# Patient Record
Sex: Female | Born: 1970
Health system: Southern US, Community
[De-identification: ages and names within clinical notes are randomized; demographics above are authoritative.]

## PROBLEM LIST (undated history)

## (undated) DIAGNOSIS — G890 Central pain syndrome: Secondary | ICD-10-CM

## (undated) DIAGNOSIS — G9332 Myalgic encephalomyelitis/chronic fatigue syndrome: Secondary | ICD-10-CM

## (undated) DIAGNOSIS — R0602 Shortness of breath: Secondary | ICD-10-CM

## (undated) DIAGNOSIS — F419 Anxiety disorder, unspecified: Secondary | ICD-10-CM

## (undated) DIAGNOSIS — K5909 Other constipation: Secondary | ICD-10-CM

## (undated) DIAGNOSIS — J309 Allergic rhinitis, unspecified: Secondary | ICD-10-CM

## (undated) DIAGNOSIS — Z86711 Personal history of pulmonary embolism: Secondary | ICD-10-CM

## (undated) DIAGNOSIS — I2699 Other pulmonary embolism without acute cor pulmonale: Secondary | ICD-10-CM

## (undated) DIAGNOSIS — G43909 Migraine, unspecified, not intractable, without status migrainosus: Secondary | ICD-10-CM

## (undated) DIAGNOSIS — M25569 Pain in unspecified knee: Secondary | ICD-10-CM

## (undated) DIAGNOSIS — K829 Disease of gallbladder, unspecified: Secondary | ICD-10-CM

## (undated) DIAGNOSIS — F32A Depression, unspecified: Secondary | ICD-10-CM

## (undated) DIAGNOSIS — Z8719 Personal history of other diseases of the digestive system: Secondary | ICD-10-CM

## (undated) DIAGNOSIS — Z973 Presence of spectacles and contact lenses: Secondary | ICD-10-CM

## (undated) DIAGNOSIS — G379 Demyelinating disease of central nervous system, unspecified: Secondary | ICD-10-CM

## (undated) DIAGNOSIS — G8929 Other chronic pain: Secondary | ICD-10-CM

## (undated) DIAGNOSIS — E785 Hyperlipidemia, unspecified: Secondary | ICD-10-CM

## (undated) DIAGNOSIS — F411 Generalized anxiety disorder: Secondary | ICD-10-CM

## (undated) DIAGNOSIS — R110 Nausea: Secondary | ICD-10-CM

## (undated) DIAGNOSIS — F909 Attention-deficit hyperactivity disorder, unspecified type: Secondary | ICD-10-CM

## (undated) DIAGNOSIS — Z86010 Personal history of colonic polyps: Secondary | ICD-10-CM

## (undated) DIAGNOSIS — F329 Major depressive disorder, single episode, unspecified: Secondary | ICD-10-CM

## (undated) DIAGNOSIS — F988 Other specified behavioral and emotional disorders with onset usually occurring in childhood and adolescence: Secondary | ICD-10-CM

## (undated) DIAGNOSIS — D6861 Antiphospholipid syndrome: Secondary | ICD-10-CM

## (undated) DIAGNOSIS — M2550 Pain in unspecified joint: Secondary | ICD-10-CM

## (undated) DIAGNOSIS — E119 Type 2 diabetes mellitus without complications: Secondary | ICD-10-CM

## (undated) DIAGNOSIS — G5600 Carpal tunnel syndrome, unspecified upper limb: Secondary | ICD-10-CM

## (undated) DIAGNOSIS — T7840XA Allergy, unspecified, initial encounter: Secondary | ICD-10-CM

## (undated) DIAGNOSIS — S83207A Unspecified tear of unspecified meniscus, current injury, left knee, initial encounter: Secondary | ICD-10-CM

## (undated) DIAGNOSIS — S83249A Other tear of medial meniscus, current injury, unspecified knee, initial encounter: Secondary | ICD-10-CM

## (undated) DIAGNOSIS — Z86718 Personal history of other venous thrombosis and embolism: Secondary | ICD-10-CM

## (undated) DIAGNOSIS — R531 Weakness: Secondary | ICD-10-CM

## (undated) HISTORY — DX: Demyelinating disease of central nervous system, unspecified: G37.9

## (undated) HISTORY — PX: ADENOIDECTOMY: SUR15

## (undated) HISTORY — DX: Disease of gallbladder, unspecified: K82.9

## (undated) HISTORY — DX: Anxiety disorder, unspecified: F41.9

## (undated) HISTORY — DX: Antiphospholipid syndrome: D68.61

## (undated) HISTORY — DX: Depression, unspecified: F32.A

## (undated) HISTORY — DX: Attention-deficit hyperactivity disorder, unspecified type: F90.9

## (undated) HISTORY — DX: Type 2 diabetes mellitus without complications: E11.9

## (undated) HISTORY — DX: Other chronic pain: G89.29

## (undated) HISTORY — DX: Other pulmonary embolism without acute cor pulmonale: I26.99

## (undated) HISTORY — DX: Central pain syndrome: G89.0

## (undated) HISTORY — DX: Other tear of medial meniscus, current injury, unspecified knee, initial encounter: S83.249A

## (undated) HISTORY — DX: Major depressive disorder, single episode, unspecified: F32.9

## (undated) HISTORY — DX: Nausea: R11.0

## (undated) HISTORY — DX: Myalgic encephalomyelitis/chronic fatigue syndrome: G93.32

## (undated) HISTORY — DX: Personal history of pulmonary embolism: Z86.711

## (undated) HISTORY — DX: Migraine, unspecified, not intractable, without status migrainosus: G43.909

## (undated) HISTORY — DX: Shortness of breath: R06.02

## (undated) HISTORY — PX: CARPAL TUNNEL RELEASE: SHX101

## (undated) HISTORY — DX: Pain in unspecified joint: M25.50

## (undated) HISTORY — DX: Pain in unspecified knee: M25.569

## (undated) HISTORY — DX: Hyperlipidemia, unspecified: E78.5

## (undated) HISTORY — DX: Carpal tunnel syndrome, unspecified upper limb: G56.00

## (undated) HISTORY — DX: Personal history of other venous thrombosis and embolism: Z86.718

## (undated) HISTORY — PX: OTHER SURGICAL HISTORY: SHX169

## (undated) HISTORY — DX: Allergy, unspecified, initial encounter: T78.40XA

## (undated) HISTORY — DX: Other specified behavioral and emotional disorders with onset usually occurring in childhood and adolescence: F98.8

---

## 1991-08-23 HISTORY — PX: KNEE ARTHROSCOPY: SUR90

## 1995-08-23 HISTORY — PX: BREAST REDUCTION SURGERY: SHX8

## 1996-08-22 HISTORY — PX: CHOLECYSTECTOMY: SHX55

## 2002-05-09 ENCOUNTER — Encounter: Payer: Self-pay | Admitting: Internal Medicine

## 2002-05-09 ENCOUNTER — Encounter (INDEPENDENT_AMBULATORY_CARE_PROVIDER_SITE_OTHER): Payer: Self-pay | Admitting: *Deleted

## 2004-06-03 ENCOUNTER — Ambulatory Visit: Payer: Self-pay | Admitting: Allergy

## 2005-12-16 ENCOUNTER — Ambulatory Visit (HOSPITAL_COMMUNITY): Payer: Self-pay | Admitting: Psychiatry

## 2006-01-02 ENCOUNTER — Ambulatory Visit (HOSPITAL_COMMUNITY): Payer: Self-pay | Admitting: Psychiatry

## 2006-01-27 ENCOUNTER — Ambulatory Visit (HOSPITAL_COMMUNITY): Payer: Self-pay | Admitting: Psychiatry

## 2006-02-13 ENCOUNTER — Ambulatory Visit (HOSPITAL_COMMUNITY): Payer: Self-pay | Admitting: Psychiatry

## 2006-04-25 ENCOUNTER — Ambulatory Visit (HOSPITAL_COMMUNITY): Payer: Self-pay | Admitting: Psychiatry

## 2006-05-17 ENCOUNTER — Ambulatory Visit (HOSPITAL_COMMUNITY): Payer: Self-pay | Admitting: Psychiatry

## 2006-05-31 ENCOUNTER — Ambulatory Visit (HOSPITAL_COMMUNITY): Payer: Self-pay | Admitting: Psychiatry

## 2006-06-07 ENCOUNTER — Ambulatory Visit: Payer: Self-pay | Admitting: Internal Medicine

## 2006-06-19 ENCOUNTER — Ambulatory Visit (HOSPITAL_COMMUNITY): Payer: Self-pay | Admitting: Psychiatry

## 2006-07-03 ENCOUNTER — Ambulatory Visit (HOSPITAL_COMMUNITY): Payer: Self-pay | Admitting: Psychiatry

## 2006-07-17 ENCOUNTER — Ambulatory Visit (HOSPITAL_COMMUNITY): Payer: Self-pay | Admitting: Psychiatry

## 2006-11-16 ENCOUNTER — Ambulatory Visit: Payer: Self-pay | Admitting: Family Medicine

## 2006-12-14 ENCOUNTER — Ambulatory Visit: Payer: Self-pay | Admitting: Internal Medicine

## 2006-12-15 LAB — CONVERTED CEMR LAB
Albumin: 3.8 g/dL (ref 3.5–5.2)
BUN: 11 mg/dL (ref 6–23)
CO2: 27 meq/L (ref 19–32)
Calcium: 9.2 mg/dL (ref 8.4–10.5)
Chloride: 104 meq/L (ref 96–112)
Creatinine, Ser: 0.7 mg/dL (ref 0.4–1.2)
GFR calc Af Amer: 122 mL/min
GFR calc non Af Amer: 101 mL/min
Glucose, Bld: 277 mg/dL — ABNORMAL HIGH (ref 70–99)
Hgb A1c MFr Bld: 12.2 % — ABNORMAL HIGH (ref 4.6–6.0)
Phosphorus: 3.5 mg/dL (ref 2.3–4.6)
Potassium: 4.2 meq/L (ref 3.5–5.1)
Sodium: 136 meq/L (ref 135–145)

## 2007-04-20 ENCOUNTER — Ambulatory Visit: Payer: Self-pay | Admitting: Family Medicine

## 2007-04-20 ENCOUNTER — Emergency Department (HOSPITAL_COMMUNITY): Admission: EM | Admit: 2007-04-20 | Discharge: 2007-04-20 | Payer: Self-pay | Admitting: Emergency Medicine

## 2007-04-20 LAB — CONVERTED CEMR LAB: Blood Glucose, Fingerstick: 177

## 2007-05-14 DIAGNOSIS — F308 Other manic episodes: Secondary | ICD-10-CM | POA: Insufficient documentation

## 2007-08-07 ENCOUNTER — Telehealth (INDEPENDENT_AMBULATORY_CARE_PROVIDER_SITE_OTHER): Payer: Self-pay | Admitting: *Deleted

## 2007-09-18 ENCOUNTER — Telehealth (INDEPENDENT_AMBULATORY_CARE_PROVIDER_SITE_OTHER): Payer: Self-pay | Admitting: *Deleted

## 2007-09-27 ENCOUNTER — Telehealth (INDEPENDENT_AMBULATORY_CARE_PROVIDER_SITE_OTHER): Payer: Self-pay | Admitting: *Deleted

## 2007-10-18 ENCOUNTER — Telehealth (INDEPENDENT_AMBULATORY_CARE_PROVIDER_SITE_OTHER): Payer: Self-pay | Admitting: *Deleted

## 2007-11-28 ENCOUNTER — Telehealth (INDEPENDENT_AMBULATORY_CARE_PROVIDER_SITE_OTHER): Payer: Self-pay | Admitting: *Deleted

## 2007-12-17 ENCOUNTER — Encounter: Payer: Self-pay | Admitting: Family Medicine

## 2007-12-17 ENCOUNTER — Ambulatory Visit: Payer: Self-pay | Admitting: Family Medicine

## 2007-12-17 DIAGNOSIS — N912 Amenorrhea, unspecified: Secondary | ICD-10-CM | POA: Insufficient documentation

## 2007-12-17 LAB — CONVERTED CEMR LAB: hCG, Beta Chain, Quant, S: 81.23 milliintl units/mL

## 2007-12-19 ENCOUNTER — Ambulatory Visit: Payer: Self-pay | Admitting: Family Medicine

## 2007-12-21 ENCOUNTER — Telehealth (INDEPENDENT_AMBULATORY_CARE_PROVIDER_SITE_OTHER): Payer: Self-pay | Admitting: Internal Medicine

## 2007-12-21 ENCOUNTER — Encounter (INDEPENDENT_AMBULATORY_CARE_PROVIDER_SITE_OTHER): Payer: Self-pay | Admitting: Internal Medicine

## 2007-12-26 ENCOUNTER — Telehealth (INDEPENDENT_AMBULATORY_CARE_PROVIDER_SITE_OTHER): Payer: Self-pay | Admitting: Internal Medicine

## 2007-12-28 ENCOUNTER — Telehealth (INDEPENDENT_AMBULATORY_CARE_PROVIDER_SITE_OTHER): Payer: Self-pay | Admitting: Internal Medicine

## 2008-02-01 ENCOUNTER — Telehealth (INDEPENDENT_AMBULATORY_CARE_PROVIDER_SITE_OTHER): Payer: Self-pay | Admitting: Internal Medicine

## 2008-06-06 ENCOUNTER — Encounter: Payer: Self-pay | Admitting: Gastroenterology

## 2008-06-06 ENCOUNTER — Encounter (INDEPENDENT_AMBULATORY_CARE_PROVIDER_SITE_OTHER): Payer: Self-pay | Admitting: *Deleted

## 2008-06-13 ENCOUNTER — Telehealth: Payer: Self-pay | Admitting: Family Medicine

## 2008-06-24 ENCOUNTER — Encounter (INDEPENDENT_AMBULATORY_CARE_PROVIDER_SITE_OTHER): Payer: Self-pay | Admitting: *Deleted

## 2008-07-03 ENCOUNTER — Telehealth (INDEPENDENT_AMBULATORY_CARE_PROVIDER_SITE_OTHER): Payer: Self-pay | Admitting: Internal Medicine

## 2008-07-03 ENCOUNTER — Ambulatory Visit: Payer: Self-pay | Admitting: Family Medicine

## 2008-07-03 DIAGNOSIS — F411 Generalized anxiety disorder: Secondary | ICD-10-CM | POA: Insufficient documentation

## 2008-07-14 ENCOUNTER — Telehealth (INDEPENDENT_AMBULATORY_CARE_PROVIDER_SITE_OTHER): Payer: Self-pay | Admitting: Internal Medicine

## 2008-07-24 ENCOUNTER — Ambulatory Visit: Payer: Self-pay | Admitting: Family Medicine

## 2008-07-28 ENCOUNTER — Ambulatory Visit: Payer: Self-pay | Admitting: Internal Medicine

## 2008-07-28 DIAGNOSIS — E119 Type 2 diabetes mellitus without complications: Secondary | ICD-10-CM | POA: Insufficient documentation

## 2008-07-28 DIAGNOSIS — E1165 Type 2 diabetes mellitus with hyperglycemia: Secondary | ICD-10-CM | POA: Insufficient documentation

## 2008-07-29 ENCOUNTER — Telehealth: Payer: Self-pay | Admitting: Internal Medicine

## 2008-12-31 ENCOUNTER — Encounter (INDEPENDENT_AMBULATORY_CARE_PROVIDER_SITE_OTHER): Payer: Self-pay | Admitting: *Deleted

## 2009-05-22 LAB — CONVERTED CEMR LAB

## 2009-11-11 ENCOUNTER — Ambulatory Visit: Payer: Self-pay | Admitting: Internal Medicine

## 2009-11-11 DIAGNOSIS — M546 Pain in thoracic spine: Secondary | ICD-10-CM | POA: Insufficient documentation

## 2009-12-21 ENCOUNTER — Telehealth: Payer: Self-pay | Admitting: Internal Medicine

## 2009-12-21 ENCOUNTER — Ambulatory Visit: Payer: Self-pay | Admitting: Internal Medicine

## 2009-12-21 DIAGNOSIS — J329 Chronic sinusitis, unspecified: Secondary | ICD-10-CM | POA: Insufficient documentation

## 2009-12-21 DIAGNOSIS — B009 Herpesviral infection, unspecified: Secondary | ICD-10-CM | POA: Insufficient documentation

## 2009-12-21 DIAGNOSIS — J309 Allergic rhinitis, unspecified: Secondary | ICD-10-CM | POA: Insufficient documentation

## 2009-12-21 LAB — CONVERTED CEMR LAB
Creatinine,U: 71.4 mg/dL
Hgb A1c MFr Bld: 9.2 % — ABNORMAL HIGH (ref 4.6–6.5)
Microalb Creat Ratio: 12.6 mg/g (ref 0.0–30.0)
Microalb, Ur: 0.9 mg/dL (ref 0.0–1.9)

## 2009-12-21 LAB — HM DIABETES FOOT EXAM

## 2010-02-26 ENCOUNTER — Ambulatory Visit: Payer: Self-pay | Admitting: Internal Medicine

## 2010-02-26 DIAGNOSIS — E669 Obesity, unspecified: Secondary | ICD-10-CM | POA: Insufficient documentation

## 2010-02-26 DIAGNOSIS — Z6838 Body mass index (BMI) 38.0-38.9, adult: Secondary | ICD-10-CM | POA: Insufficient documentation

## 2010-02-26 DIAGNOSIS — E6609 Other obesity due to excess calories: Secondary | ICD-10-CM | POA: Insufficient documentation

## 2010-04-30 ENCOUNTER — Emergency Department (HOSPITAL_COMMUNITY): Admission: EM | Admit: 2010-04-30 | Discharge: 2010-04-30 | Payer: Self-pay | Admitting: Emergency Medicine

## 2010-05-06 ENCOUNTER — Telehealth: Payer: Self-pay | Admitting: Internal Medicine

## 2010-05-06 ENCOUNTER — Ambulatory Visit: Payer: Self-pay | Admitting: Internal Medicine

## 2010-05-06 DIAGNOSIS — R209 Unspecified disturbances of skin sensation: Secondary | ICD-10-CM | POA: Insufficient documentation

## 2010-05-06 LAB — CONVERTED CEMR LAB
Folate: 6.6 ng/mL
Free T4: 0.76 ng/dL (ref 0.60–1.60)
Iron: 65 ug/dL (ref 42–145)
Saturation Ratios: 20.4 % (ref 20.0–50.0)
TSH: 1.14 microintl units/mL (ref 0.35–5.50)
Transferrin: 227.1 mg/dL (ref 212.0–360.0)
Vitamin B-12: 731 pg/mL (ref 211–911)

## 2010-05-11 ENCOUNTER — Telehealth: Payer: Self-pay | Admitting: Internal Medicine

## 2010-05-11 ENCOUNTER — Encounter: Payer: Self-pay | Admitting: Internal Medicine

## 2010-05-13 ENCOUNTER — Ambulatory Visit (HOSPITAL_COMMUNITY): Admission: RE | Admit: 2010-05-13 | Discharge: 2010-05-13 | Payer: Self-pay | Admitting: Internal Medicine

## 2010-05-14 ENCOUNTER — Telehealth: Payer: Self-pay | Admitting: Internal Medicine

## 2010-05-18 ENCOUNTER — Encounter: Payer: Self-pay | Admitting: Internal Medicine

## 2010-05-21 ENCOUNTER — Encounter: Payer: Self-pay | Admitting: Internal Medicine

## 2010-05-27 ENCOUNTER — Ambulatory Visit: Payer: Self-pay | Admitting: Internal Medicine

## 2010-05-28 DIAGNOSIS — E785 Hyperlipidemia, unspecified: Secondary | ICD-10-CM | POA: Insufficient documentation

## 2010-05-28 LAB — CONVERTED CEMR LAB
BUN: 16 mg/dL (ref 6–23)
CO2: 23 meq/L (ref 19–32)
Calcium: 9.5 mg/dL (ref 8.4–10.5)
Chloride: 100 meq/L (ref 96–112)
Cholesterol: 219 mg/dL — ABNORMAL HIGH (ref 0–200)
Creatinine, Ser: 0.7 mg/dL (ref 0.4–1.2)
Creatinine,U: 32.9 mg/dL
Direct LDL: 132.4 mg/dL
GFR calc non Af Amer: 96.8 mL/min (ref >60)
Glucose, Bld: 168 mg/dL — ABNORMAL HIGH (ref 70–99)
HDL: 56 mg/dL (ref >39.00)
Hgb A1c MFr Bld: 7.9 % — ABNORMAL HIGH (ref 4.6–6.5)
Microalb Creat Ratio: 0.3 mg/g (ref 0.0–30.0)
Microalb, Ur: 0.1 mg/dL (ref 0.0–1.9)
Potassium: 4.4 meq/L (ref 3.5–5.1)
Sodium: 132 meq/L — ABNORMAL LOW (ref 135–145)
Total CHOL/HDL Ratio: 4
Triglycerides: 266 mg/dL — ABNORMAL HIGH (ref 0.0–149.0)
VLDL: 53.2 mg/dL — ABNORMAL HIGH (ref 0.0–40.0)

## 2010-06-07 ENCOUNTER — Encounter: Payer: Self-pay | Admitting: Internal Medicine

## 2010-07-26 ENCOUNTER — Encounter: Payer: Self-pay | Admitting: Internal Medicine

## 2010-07-27 ENCOUNTER — Ambulatory Visit: Payer: Self-pay | Admitting: Internal Medicine

## 2010-08-17 ENCOUNTER — Encounter: Payer: Self-pay | Admitting: Internal Medicine

## 2010-08-24 ENCOUNTER — Encounter: Payer: Self-pay | Admitting: Internal Medicine

## 2010-09-07 ENCOUNTER — Ambulatory Visit
Admission: RE | Admit: 2010-09-07 | Discharge: 2010-09-07 | Payer: Self-pay | Source: Home / Self Care | Attending: Internal Medicine | Admitting: Internal Medicine

## 2010-09-07 ENCOUNTER — Encounter: Payer: Self-pay | Admitting: Internal Medicine

## 2010-09-07 ENCOUNTER — Other Ambulatory Visit: Payer: Self-pay | Admitting: Internal Medicine

## 2010-09-07 DIAGNOSIS — G890 Central pain syndrome: Secondary | ICD-10-CM | POA: Insufficient documentation

## 2010-09-07 LAB — HEMOGLOBIN A1C: Hgb A1c MFr Bld: 8.8 % — ABNORMAL HIGH (ref 4.6–6.5)

## 2010-09-08 ENCOUNTER — Encounter: Payer: Self-pay | Admitting: Internal Medicine

## 2010-09-15 ENCOUNTER — Encounter: Payer: Self-pay | Admitting: Internal Medicine

## 2010-09-20 ENCOUNTER — Encounter: Payer: Self-pay | Admitting: Internal Medicine

## 2010-09-23 NOTE — Assessment & Plan Note (Signed)
Summary: New to establish    Vital Signs:  Patient profile:   40 year old female Height:      65 inches (165.10 cm) Weight:      216.0 pounds (98.18 kg) O2 Sat:      99 % on Room air Temp:     97.9 degrees F (36.61 degrees C) oral Pulse rate:   99 / minute BP sitting:   122 / 72  (left arm) Cuff size:   large  Vitals Entered By: Orlan Leavens RMA (May 27, 2010 10:29 AM)  O2 Flow:  Room air CC: New patient Is Patient Diabetic? Yes Did you bring your meter with you today? No Pain Assessment Patient in pain? no      Comments Been having some neurological problems want to know will it be ok to take flu shot. Also want refill on valtrex   Primary Care Provider:  Newt Lukes, MD  CC:  New patient.  History of Present Illness: here to formally est with me as PCP -  prev care at [facility] [place] [facility], txfr to this office for convience fo care  LUE pain and parasthesia - onset 02/2010 following trauma to elbow - seen in ED for same -? possible ulnar nerve entrapement.  progressive paresthesia on the left side along with pain in the left leg and anterior chest wall. has been seen by nsurg and neuro in interum - MRI brain w/o MS signs per neuro, mri c-spine normal - for NCS and hand eval 06/07/10  DM2 - reports compliance with ongoing medical treatment and no changes in medication dose or frequency. denies adverse side effects related to current therapy. checks cbg 2-3x/wk - weight gain reviewed and indescretion with diet  bipolar mania - not tol of antipsyc meds in past - feels anxiety is exac by stress from medical illness - sees scott cunningham every 6 mo, no visit since trauma to elbow - reports compliance with ongoing medical treatment and no changes in medication dose or frequency. denies adverse side effects related to current therapy. ?need more xanax - will f/u with psyc for same    Preventive Screening-Counseling & Management  Alcohol-Tobacco     Alcohol  drinks/day: <1     Alcohol Counseling: not indicated; patient does not drink     Smoking Status: current     Packs/Day: <0.25     Tobacco Counseling: to quit use of tobacco products  Caffeine-Diet-Exercise     Caffeine Counseling: decrease use of caffeine     Diet Counseling: to improve diet; diet is suboptimal     Does Patient Exercise: yes     Type of exercise: walk     Exercise Counseling: to improve exercise regimen  Safety-Violence-Falls     Seat Belt Counseling: not indicated; patient wears seat belts     Helmet Counseling: not applicable     Firearm Counseling: not applicable     Violence Counseling: not applicable     Fall Risk Counseling: not indicated; no significant falls noted  Clinical Review Panels:  Prevention   Last Pap Smear:  Interpretation Result:Negative for intraepithelial Lesion or Malignancy.    (05/22/2009)   Last Colonoscopy:  Results: Polyp. Tubular Adenoma Results: Diverticulosis.       Results: Colitis.       Location:  [facility].  (06/06/2008)  Immunizations   Last Tetanus Booster:  Tdap (06/07/2006)  Diabetes Management   HgBA1C:  9.2 (12/21/2009)   Creatinine:  0.7 (12/15/2006)   Last Foot Exam:  yes (12/21/2009)  Complete Metabolic Panel   Glucose:  277 (12/15/2006)   Sodium:  136 (12/15/2006)   Potassium:  4.2 (12/15/2006)   Chloride:  104 (12/15/2006)   CO2:  27 (12/15/2006)   BUN:  11 (12/15/2006)   Creatinine:  0.7 (12/15/2006)   Albumin:  3.8 (12/15/2006)   Calcium:  9.2 (12/15/2006)   Current Medications (verified): 1)  Zyrtec Allergy 10 Mg  Tabs (Cetirizine Hcl) .... Take 1 Tablet By Mouth Once A Day 2)  Trazodone Hcl 100 Mg Tabs (Trazodone Hcl) .... Take 2 At Bedtime 3)  Xanax 0.5 Mg Tabs (Alprazolam) .... Take 1 Two Times A Day 4)  Metformin Hcl 500 Mg Tabs (Metformin Hcl) .... Take 1 By Mouth Two Times A Day 5)  Valtrex 1 Gm Tabs (Valacyclovir Hcl) .[name].. 1 By Mouth Once Daily As Needed For Fever Blisters 6)   Docusate Sodium 100 Mg Tabs (Docusate Sodium) .... Take 2 Once Daily 7)  Fiber 625 Mg Tabs (Calcium Polycarbophil) .... Once Daily 8)  Multivitamins  Tabs (Multiple Vitamin) .... Once Daily 9)  Coq10 100 Mg Caps (Coenzyme Q10) .... Once Daily 10)  B Complex 100  Tabs (B Complex Vitamins) .... Take 1 By Mouth Once Daily 11)  Vitamin C 500 Mg Tabs (Ascorbic Acid) .... Once Daily 12)  Potassium 99 Mg Tabs (Potassium) .... Once Daily 13)  Magnesium 250 Mg Tabs (Magnesium) .... Once Daily 14)  Gabapentin 300 Mg Caps (Gabapentin) .[name].. 1 By Mouth At Bedtime X 5, 1 By Mouth Two Times A Day X 5, 1 By Mouth Three Times A Day 15)  Vitamin D3 2000 Unit Caps (Cholecalciferol) .... Take 1 By Mouth Once Daily  Allergies (verified): 1)  ! Morphine 2)  ! * Latex  Past History:  Past medical, surgical, family and social histories (including risk factors) reviewed, and no changes noted (except as noted below).  Past Medical History: Bipolar--manic--5/07 Diabetes mellitus, type II--2000 Allergic rhinitis   MD roster: gyn - [facility] psyc - scott cunningham neuro - yan nsurg - nudleman hand - gramig  Past Surgical History: Breast reduction 1997 Cholecystectomy 1998 IUD - 2009   Family History: Reviewed history from 05/14/2007 and no changes required. Adopted   Adoptive parents are alive Siblings: One brother, one sister  Social History: Reviewed history from 12/21/2009 and no changes required. Marital Status: Divorced lives with boyfriend, christopehr Children: None Occupation: Engineer, materials - nurse paralegal  previous smoker, quit 2010 - occ cig with beer Alcohol use-rare Packs/Day:  <0.25 Does Patient Exercise:  yes  Review of Systems       see HPI above. I have reviewed all other systems and they were negative.   Physical Exam  General:  Overweight white female in no distress - alert, well-developed, well-nourished, and cooperative to examination.    Eyes:  vision  grossly intact; pupils equal, round and reactive to light.  conjunctiva and lids normal.   wears glasses Lungs:  normal respiratory effort, no intercostal retractions or use of accessory muscles; normal breath sounds bilaterally - no crackles and no wheezes.    Heart:  normal rate, regular rhythm, no murmur, and no rub. BLE without edema.  Abdomen:  obese, soft, non-tender, normal bowel sounds, no distention; no masses and no appreciable hepatomegaly or splenomegaly.   Genitalia:  defer gyn Msk:  normal ROM, no joint swelling, no redness over joints, and no joint deformities.   Neurologic:  alert & oriented X3, cranial nerves II-XII intact, strength normal in all extremities, sensation intact to light touch, sensation intact to pinprick, gait normal, DTRs symmetrical and normal, and finger-to-nose normal.   Psych:  Oriented X3, memory intact for recent and remote, normally interactive, good eye contact, mod anxious appearing, not depressed appearing, and not agitated.      Impression & Recommendations:  Problem # 1:  DIABETES MELLITUS, TYPE II (ICD-250.00)  Her updated medication list for this problem includes:    Metformin Hcl 500 Mg Tabs (Metformin hcl) .[name]... Take 1 by mouth two times a day  Orders: TLB-Lipid Panel (80061-LIPID) TLB-BMP (Basic Metabolic Panel-BMET) (80048-METABOL) TLB-A1C / Hgb A1C (Glycohemoglobin) (83036-A1C) TLB-Microalbumin/Creat Ratio, Urine (82043-MALB)  Labs Reviewed: Creat: 0.7 (12/15/2006)    Reviewed HgBA1c results: 9.2 (12/21/2009)  12.2 (12/15/2006)  Problem # 2:  ANXIETY (ICD-300.00)  Her updated medication list for this problem includes:    Trazodone Hcl 100 Mg Tabs (Trazodone hcl) .[name]... Take 2 at bedtime    Xanax 0.5 Mg Tabs (Alprazolam) .[name]... Take 1 two times a day  buspar and venlafax trial prev no help not manic recently but anxious not really depressed cont to f/u with psyc as ongoing - sooner for acute anxiety symptoms related to LUE  symptoms - pt agrees to same no med changes today by me  Problem # 3:  PARESTHESIA (ICD-782.0) prior eval neg for MS or neuropathic dz - reviewed eval by nsurg and neuro including MRI brain and c-spine reports agree with cont w/u for NCS for ulnar injury and hand eval -  ?possible posttraumatic RSD though dx would not explain LLE symptoms - same reviewed with pt today  Problem # 4:  OBESITY (ICD-278.00)  weight down >30lbs since tx begun with weight loss clinic -but now up since off phenteramine reviewed i do not rx hcg shots and feel 3 mo in max duration for phenteramine in a year - pt agrees  Ht: 65 (02/26/2010)   Wt: 208.12 (02/26/2010)   BMI: 37.04 (11/11/2009)  Ht: 65 (05/27/2010)   Wt: 216.0 (05/27/2010)   BMI: 35.24 (05/06/2010)  Time spent with patient 42 minutes, more than 50% of this time was spent counseling patient on weight control, left arm injury and eval to date and need to control DM + psyc symptoms   Complete Medication List: 1)  Zyrtec Allergy 10 Mg Tabs (Cetirizine hcl) .... Take 1 tablet by mouth once a day 2)  Trazodone Hcl 100 Mg Tabs (Trazodone hcl) .... Take 2 at bedtime 3)  Xanax 0.5 Mg Tabs (Alprazolam) .... Take 1 two times a day 4)  Metformin Hcl 500 Mg Tabs (Metformin hcl) .... Take 1 by mouth two times a day 5)  Valtrex 1 Gm Tabs (Valacyclovir hcl) .[name].. 1 by mouth once daily as needed for fever blisters 6)  Docusate Sodium 100 Mg Tabs (Docusate sodium) .... Take 2 once daily 7)  Fiber 625 Mg Tabs (Calcium polycarbophil) .... Once daily 8)  Multivitamins Tabs (Multiple vitamin) .... Once daily 9)  Coq10 100 Mg Caps (Coenzyme q10) .... Once daily 10)  B Complex 100 Tabs (B complex vitamins) .... Take 1 by mouth once daily 11)  Vitamin C 500 Mg Tabs (Ascorbic acid) .... Once daily 12)  Potassium 99 Mg Tabs (Potassium) .... Once daily 13)  Magnesium 250 Mg Tabs (Magnesium) .... Once daily 14)  Gabapentin 300 Mg Caps (Gabapentin) .[name].. 1 by mouth at bedtime  x 5, 1 by mouth two times  a day x 5, 1 by mouth three times a day 15)  Vitamin D3 2000 Unit Caps (Cholecalciferol) .... Take 1 by mouth once daily  Patient Instructions: 1)  it was good to see you today. 2)  test(s) ordered today - your results will be posted on the phone tree for review in 48-72 hours from the time of test completion; call [phone] and enter your 9 digit MRN (listed above on this page, just below your name); if any changes need to be made or there are abnormal results, you will be contacted directly.  3)  keep followup as scheduled with dr. Terrace Arabia and dr. Amanda Pea 4)  hold on flu shot until ok'ed by dr. Terrace Arabia 5)  Please schedule a follow-up appointment in 3-4 months to review diabetes and other medical issues, call sooner if problems.  Prescriptions: VALTREX 1 GM TABS (VALACYCLOVIR HCL) 1 by mouth once daily as needed for fever blisters  #12 x 1   Entered by:   Orlan Leavens RMA   Authorized by:   Newt Lukes MD   Signed by:   Orlan Leavens RMA on 05/27/2010   Method used:   Electronically to        [facility]* (retail)       [address]       [place], [place]  [address]       Ph: [phone]       Fax: [phone]   RxID:   [id number]      Pap Smear  Procedure date:  05/22/2009  Findings:      Interpretation Result:Negative for intraepithelial Lesion or Malignancy.

## 2010-09-23 NOTE — Progress Notes (Signed)
Summary: RESULTS  Phone Note Call from Patient Call back at Home Phone [phone]   Summary of Call: Patient is requesting results of labs.  Initial call taken by: Lamar Sprinkles, CMA,  May 11, 2010 3:01 PM  Follow-up for Phone Call        called pt with lab results-normal. she is still having arm pain. for MRI Thursday.  Follow-up by: Jacques Navy MD,  May 11, 2010 6:14 PM

## 2010-09-23 NOTE — Progress Notes (Signed)
Summary: OV/Rx?  Phone Note Call from Patient Call back at Home Phone [phone]   Caller: Patient Summary of Call: pt called stating that she has scheduled appt today @ 2:30p with VAL for fever blisters but wouldlike tok now if she can get an RX for valtrex instead of OV. Pt says copay and medication is very expensive. please advise Initial call taken by: Margaret Pyle, CMA,  Dec 21, 2009 11:24 AM  Follow-up for Phone Call        there is no prior record of fever blisters so i am unable to prescribe med for same without seeing the problem - i believe there are OTC meds she can try if she wishes to avoid an OV, but if she comes in, we can also review the status of her DM and other problems to justify the co-pay - thanks Follow-up by: Newt Lukes MD,  Dec 21, 2009 11:38 AM  Additional Follow-up for Phone Call Additional follow up Details #1::        pt informed and will come in for OV Additional Follow-up by: Margaret Pyle, CMA,  Dec 21, 2009 11:47 AM

## 2010-09-23 NOTE — Letter (Signed)
Summary: [facility]   Imported By: Lester [place]aine 09/10/2010 10:58:04  _____________________________________________________________________  External Attachment:    Type:   Image     Comment:   External Document

## 2010-09-23 NOTE — Progress Notes (Signed)
  Phone Note Refill Request Message from:  [facility]    Refills Requested: Medication #1:  KLONOPIN 2 MG  TABS Take 2  tablet by mouth at bedtime  Method Requested: Fax to Local Pharmacy Initial call taken by: Wandra Mannan,  October 18, 2007 10:36 AM  Follow-up for Phone Call        okay #30 x 0 Needs appt before any more refills Follow-up by: Cindee Salt MD,  October 18, 2007 1:16 PM  Additional Follow-up for Phone Call Additional follow up Details #1::        rx faxed Additional Follow-up by: Wandra Mannan,  October 18, 2007 1:32 PM    New/Updated Medications: KLONOPIN 2 MG  TABS (CLONAZEPAM) Take 1  tablet by mouth at bedtime as needed   Prescriptions: KLONOPIN 2 MG  TABS (CLONAZEPAM) Take 1  tablet by mouth at bedtime as needed  #30 x 0   Entered by:   Wandra Mannan   Authorized by:   Cindee Salt MD   Signed by:   Wandra Mannan on 10/18/2007   Method used:   Handwritten   RxID:   [id number]

## 2010-09-23 NOTE — Assessment & Plan Note (Signed)
Summary: L HAND AND L FOOT PAIN-HEADACHES-STC   Vital Signs:  Patient profile:   40 year old female Height:      65 inches (165.10 cm) Weight:      222.4 pounds (101.09 kg) O2 Sat:      96 % on Room air Temp:     98.2 degrees F (36.78 degrees C) oral Pulse rate:   106 / minute BP sitting:   118 / 70  (left arm) Cuff size:   large  Vitals Entered By: Orlan Leavens RMA (July 27, 2010 9:53 AM)  O2 Flow:  Room air CC: (L) hand & foot pain, also complaining oh headaches Is Patient Diabetic? Yes Did you bring your meter with you today? No Pain Assessment Patient in pain? yes     Location: (L) foot & hand Type: aching   Primary Care Provider:  Newt Lukes, MD  CC:  (L) hand & foot pain and also complaining oh headaches.  History of Present Illness: here for f/u  LUE pain and parasthesia - onset 02/2010 following trauma to elbow - seen in ED for same -? possible ulnar nerve entrapement.  progressive paresthesia on the left side along with pain in the left leg and anterior chest wall. has been seen by nsurg, ortho and neuro in interim - MRI brain w/o MS signs per neuro, mri c-spine normal - normal NCS and hand eval 06/07/10  DM2 - reports compliance with ongoing medical treatment and no changes in medication dose or frequency. denies adverse side effects related to current therapy. checks cbg 2-3x/wk - weight gain reviewed and indescretion with diet  bipolar mania - not tol of antipsyc meds in past - feels anxiety is exac by stress from medical illness - sees scott cunningham every 6 mo, no visit since trauma to elbow - reports compliance with ongoing medical treatment and no changes in medication dose or frequency. denies adverse side effects related to current therapy. inc depression from unexplained pain symptoms (see above)    Clinical Review Panels:  Lipid Management   Cholesterol:  219 (05/27/2010)   HDL (good cholesterol):  56.00 (05/27/2010)  Diabetes  Management   HgBA1C:  7.9 (05/27/2010)   Creatinine:  0.7 (05/27/2010)   Last Foot Exam:  yes (12/21/2009)  Complete Metabolic Panel   Glucose:  168 (05/27/2010)   Sodium:  132 (05/27/2010)   Potassium:  4.4 (05/27/2010)   Chloride:  100 (05/27/2010)   CO2:  23 (05/27/2010)   BUN:  16 (05/27/2010)   Creatinine:  0.7 (05/27/2010)   Albumin:  3.8 (12/15/2006)   Calcium:  9.5 (05/27/2010)   Current Medications (verified): 1)  Zyrtec Allergy 10 Mg  Tabs (Cetirizine Hcl) .... Take 1 Tablet By Mouth Once A Day 2)  Trazodone Hcl 100 Mg Tabs (Trazodone Hcl) .... Take 2 At Bedtime 3)  Xanax 0.5 Mg Tabs (Alprazolam) .... Take 1 Two Times A Day 4)  Metformin Hcl 1000 Mg Tabs (Metformin Hcl) .[name].. 1 By Mouth Two Times A Day 5)  Valtrex 1 Gm Tabs (Valacyclovir Hcl) .[name].. 1 By Mouth Once Daily As Needed For Fever Blisters 6)  Docusate Sodium 100 Mg Tabs (Docusate Sodium) .... Take 2 Once Daily 7)  Fiber 625 Mg Tabs (Calcium Polycarbophil) .... Once Daily 8)  Multivitamins  Tabs (Multiple Vitamin) .... Once Daily 9)  Coq10 100 Mg Caps (Coenzyme Q10) .... Once Daily 10)  B Complex 100  Tabs (B Complex Vitamins) .... Take 1 By Mouth  Once Daily 11)  Vitamin C 500 Mg Tabs (Ascorbic Acid) .... Once Daily 12)  Potassium 99 Mg Tabs (Potassium) .... Once Daily 13)  Magnesium 250 Mg Tabs (Magnesium) .... Once Daily 14)  Vitamin D3 2000 Unit Caps (Cholecalciferol) .... Take 1 By Mouth Once Daily  Allergies (verified): 1)  ! Morphine 2)  ! * Latex  Past History:  Past Medical History: Bipolar--manic--5/07 Diabetes mellitus, type II--2000 Allergic rhinitis    MD roster: gyn - [facility] psyc - scott cunningham neuro - yan nsurg - nudleman  hand - gramig  Review of Systems       The patient complains of weight gain.  The patient denies fever, chest pain, syncope, peripheral edema, and muscle weakness.    Physical Exam  General:  Overweight white female in no distress - alert, well-developed,  well-nourished, and cooperative to examination.    Lungs:  normal respiratory effort, no intercostal retractions or use of accessory muscles; normal breath sounds bilaterally - no crackles and no wheezes.    Heart:  normal rate, regular rhythm, no murmur, and no rub. BLE without edema.  Msk:  normal ROM, no joint swelling, no redness over joints, and no joint deformities.   Neurologic:  alert & oriented X3, cranial nerves II-XII intact, strength normal in all extremities, sensation intact to light touch, sensation intact to pinprick, gait normal, DTRs symmetrical and normal, and finger-to-nose normal.   Psych:  Oriented X3, memory intact for recent and remote, normally interactive, good eye contact, not anxious appearing, mildly depressed appearing, and not agitated.      Impression & Recommendations:  Problem # 1:  PARESTHESIA (ICD-782.0)  onset 01/2010 after injury to elbow prior eval neg for MS or neuropathic dz - reviewed eval by nsurg and neuro including MRI brain and c-spine reports normal NCS and hand eval -  ?possible posttraumatic RSD though dx would not explain LLE symptoms -  resume gabapentin for neuropathic pain and add low dose amitriptl - erx done  Orders: Prescription Created Electronically [phone])  Problem # 2:  DISORDER, BIPOLAR, ATYPICAL MANIC (ICD-296.81) inc depression symptoms per pt due to pain - pt to call for f/u to review med mgmt - ?cymbalta trial if feltt ok with hx mania same explained to pt who agrees  Complete Medication List: 1)  Zyrtec Allergy 10 Mg Tabs (Cetirizine hcl) .... Take 1 tablet by mouth once a day 2)  Trazodone Hcl 100 Mg Tabs (Trazodone hcl) .... Take 2 at bedtime 3)  Xanax 0.5 Mg Tabs (Alprazolam) .... Take 1 two times a day 4)  Metformin Hcl 1000 Mg Tabs (Metformin hcl) .[name].. 1 by mouth two times a day 5)  Valtrex 1 Gm Tabs (Valacyclovir hcl) .[name].. 1 by mouth once daily as needed for fever blisters 6)  Docusate Sodium 100 Mg Tabs (Docusate  sodium) .... Take 2 once daily 7)  Fiber 625 Mg Tabs (Calcium polycarbophil) .... Once daily 8)  Multivitamins Tabs (Multiple vitamin) .... Once daily 9)  Coq10 100 Mg Caps (Coenzyme q10) .... Once daily 10)  B Complex 100 Tabs (B complex vitamins) .... Take 1 by mouth once daily 11)  Vitamin C 500 Mg Tabs (Ascorbic acid) .... Once daily 12)  Potassium 99 Mg Tabs (Potassium) .... Once daily 13)  Magnesium 250 Mg Tabs (Magnesium) .... Once daily 14)  Vitamin D3 2000 Unit Caps (Cholecalciferol) .... Take 1 by mouth once daily 15)  Gabapentin 600 Mg Tabs (Gabapentin) .[name].. 1 by mouth three  times a day 16)  Amitriptyline Hcl 10 Mg Tabs (Amitriptyline hcl) .[name].. 1 by mouth at bedtime  Patient Instructions: 1)  it was good to see you today. 2)  restart and increase dose gabapentin and start amitriptiline for pain symptoms  - your prescriptions have been electronically submitted to your pharmacy. Please take as directed. Contact our office if you believe you're having problems with the medication(s).  3)  call dr. Tomasa Rand for followup before scheduled Jan appt - have him call me is needed to discuss your symptoms  4)  Please schedule follow-up appointment in 6-8 weeks to review diabetes and other medical issues, call sooner if problems.  Prescriptions: AMITRIPTYLINE HCL 10 MG TABS (AMITRIPTYLINE HCL) 1 by mouth at bedtime  #30 x 3   Entered and Authorized by:   Newt Lukes MD   Signed by:   Newt Lukes MD on 07/27/2010   Method used:   Electronically to        [facility]* (retail)       [address]       [place], [place]  [address]       Ph: [phone]       Fax: [phone]   RxID:   [id number] GABAPENTIN 600 MG TABS (GABAPENTIN) 1 by mouth three times a day  #90 x 3   Entered and Authorized by:   Newt Lukes MD   Signed by:   Newt Lukes MD on 07/27/2010   Method used:   Electronically to        [facility]* (retail)        [address]       [place], [place]  [address]       Ph: [phone]       Fax: [phone]   RxID:   [id number]    Orders Added: 1)  Est. Patient Level IV [88416] 2)  Prescription Created Electronically [phone]

## 2010-09-23 NOTE — Progress Notes (Signed)
  Phone Note Refill Request Message from:  [employer]  Refills Requested: Medication #1:  KLONOPIN 2 MG  TABS Take 2  tablet by mouth at bedtime  Method Requested: Fax to Local Pharmacy Initial call taken by: Wandra Mannan,  September 18, 2007 10:32 AM  Follow-up for Phone Call        needs appt before any prescriptions Follow-up by: Cindee Salt MD,  September 18, 2007 11:40 AM  Additional Follow-up for Phone Call Additional follow up Details #1::        rx faxed Additional Follow-up by: Wandra Mannan,  September 18, 2007 11:51 AM

## 2010-09-23 NOTE — Assessment & Plan Note (Signed)
Summary: roa/f/u per billie/cmt   Vital Signs:  Patient Profile:   40 Years Old Female Weight:      226.50 pounds (102.95 kg) Temp:     98.9 degrees F (37.17 degrees C) oral Pulse rate:   100 / minute Pulse rhythm:   regular BP sitting:   110 / 80  (left arm) Cuff size:   large  Vitals Entered By: Silas Sacramento (July 24, 2008 3:02 PM)                 Chief Complaint:  f/u.  History of Present Illness: Here for follow up of start of Buspar for anxiety--cant tell difference with med on board. Indicates thatshe has manic tendencies--addition of SSRI not a good idea.    Current Allergies: ! MORPHINE ! * LATEX     Review of Systems      See HPI   Physical Exam  General:     alert, well-developed, well-nourished, and well-hydrated.   Neurologic:     alert & oriented X3 and gait normal.   Psych:     normally interactive, not anxious appearing, and flat affect.      Impression & Recommendations:  Problem # 1:  ANXIETY (ICD-300.00) Assessment: Unchanged not improved on Buspar 15mg  two times a day due to manic tendencies, will send to psych for further eval and tx to continue Buspar at 15mg  bid Her updated medication list for this problem includes:    Buspar 5 Mg Tabs (Buspirone hcl) .[name]... 1 once daily for 1 wk,  then 1 two times a day, then take 2 qam and 1 qpm, then 2 qd    Buspar 15 Mg Tabs (Buspirone hcl) .[name]... 1 two times a day  Orders: Psychiatric Referral (Psych)   Complete Medication List: 1)  Metformin Hcl 1000 Mg Tabs (Metformin hcl) .... Take 1 tablet by mouth two times a day 2)  Zyrtec Allergy 10 Mg Tabs (Cetirizine hcl) .... Take 1 tablet by mouth once a day 3)  Xyzal 5 Mg Tabs (Levocetirizine dihydrochloride) .[name].. 1 once daily by mouth for rash 4)  Buspar 5 Mg Tabs (Buspirone hcl) .[name].. 1 once daily for 1 wk,  then 1 two times a day, then take 2 qam and 1 qpm, then 2 qd 5)  Buspar 15 Mg Tabs (Buspirone hcl) .[name].. 1 two times a day    Patient Instructions: 1)  refer to psych   ]

## 2010-09-23 NOTE — Assessment & Plan Note (Signed)
Summary: 6-8 WK FU---STC   Vital Signs:  Patient profile:   40 year old female Height:      65 inches (165.10 cm) Weight:      228 pounds (103.64 kg) O2 Sat:      96 % on Room air Temp:     98.2 degrees F (36.78 degrees C) oral Pulse rate:   112 / minute BP sitting:   120 / 72  (left arm) Cuff size:   large  Vitals Entered By: Orlan Leavens RMA (September 07, 2010 10:10 AM)  O2 Flow:  Room air CC: 6 week follow-up Is Patient Diabetic? Yes Did you bring your meter with you today? No Pain Assessment Patient in pain? no        Primary Care Provider:  Newt Lukes, MD  CC:  6 week follow-up.  History of Present Illness: here for f/u  LUE pain and parasthesia - ?RSD - onset 02/2010 following trauma to elbow - seen in ED for same.  progressive paresthesia on the left side along with pain in the left leg and anterior chest wall. has been seen by nsurg, ortho including hand and neuro in interim - MRI brain w/o MS signs per neuro, mri c-spine normal - normal NCS and hand eval 06/07/10 - now dx RSD by [place] [facility] and has recieved nerve block shots for same - intermit relief - ?consider spinal cord stimulator - would like second opinion on same before any surg  DM2 - reports compliance with ongoing medical treatment and no changes in medication dose or frequency. denies adverse side effects related to current therapy. checks cbg 2-3x/wk - weight gain reviewed and indescretion with diet  bipolar mania - not tol of antipsyc meds in past - feels anxiety is exac by stress from medical illness - sees scott cunningham every 6 mo, no visit since trauma to elbow - reports compliance with ongoing medical treatment and no changes in medication dose or frequency. denies adverse side effects related to current therapy. inc depression from unexplained pain symptoms (see above)    Clinical Review Panels:  Prevention   Last Pap Smear:  Interpretation Result:Negative for intraepithelial Lesion or  Malignancy.    (05/22/2009)   Last Colonoscopy:  Results: Polyp. Tubular Adenoma Results: Diverticulosis.       Results: Colitis.       Location:  [facility].  (06/06/2008)  Immunizations   Last Tetanus Booster:  Tdap (06/07/2006)  Lipid Management   Cholesterol:  219 (05/27/2010)   HDL (good cholesterol):  56.00 (05/27/2010)  Diabetes Management   HgBA1C:  7.9 (05/27/2010)   Creatinine:  0.7 (05/27/2010)   Last Foot Exam:  yes (12/21/2009)  Complete Metabolic Panel   Glucose:  168 (05/27/2010)   Sodium:  132 (05/27/2010)   Potassium:  4.4 (05/27/2010)   Chloride:  100 (05/27/2010)   CO2:  23 (05/27/2010)   BUN:  16 (05/27/2010)   Creatinine:  0.7 (05/27/2010)   Albumin:  3.8 (12/15/2006)   Calcium:  9.5 (05/27/2010)   Current Medications (verified): 1)  Zyrtec Allergy 10 Mg  Tabs (Cetirizine Hcl) .... Take 1 Tablet By Mouth Once A Day 2)  Trazodone Hcl 100 Mg Tabs (Trazodone Hcl) .... Take 2 At Bedtime 3)  Xanax 0.5 Mg Tabs (Alprazolam) .... Take 1 Two Times A Day 4)  Metformin Hcl 1000 Mg Tabs (Metformin Hcl) .[name].. 1 By Mouth Two Times A Day 5)  Valtrex 1 Gm Tabs (Valacyclovir Hcl) .Marland KitchenMarland KitchenMarland Kitchen 1  By Mouth Once Daily As Needed For Fever Blisters 6)  Docusate Sodium 100 Mg Tabs (Docusate Sodium) .... Take 2 Once Daily 7)  Fiber 625 Mg Tabs (Calcium Polycarbophil) .... Once Daily 8)  Multivitamins  Tabs (Multiple Vitamin) .... Once Daily 9)  Coq10 100 Mg Caps (Coenzyme Q10) .... Once Daily 10)  B Complex 100  Tabs (B Complex Vitamins) .... Take 1 By Mouth Once Daily 11)  Vitamin C 500 Mg Tabs (Ascorbic Acid) .... Once Daily 12)  Potassium 99 Mg Tabs (Potassium) .... Once Daily 13)  Magnesium 250 Mg Tabs (Magnesium) .... Once Daily 14)  Vitamin D3 2000 Unit Caps (Cholecalciferol) .... Take 1 By Mouth Once Daily 15)  Gabapentin 600 Mg Tabs (Gabapentin) .[name].. 1 By Mouth Three Times A Day 16)  Amitriptyline Hcl 10 Mg Tabs (Amitriptyline Hcl) .[name].. 1 By Mouth At Bedtime 17)   Flexeril 10 Mg Tabs (Cyclobenzaprine Hcl) .... Take 1 As Needed 18)  Dulcolax 5 Mg Tbec (Bisacodyl) .... Take 1 By Mouth Once Daily 19)  Ketoprofen 5%/amtriptyline 2% Compound .... Apply 1-2 Grams 3-4 Times A Day  Allergies (verified): 1)  ! Morphine 2)  ! * Latex  Past History:  Past Medical History: Bipolar--manic--5/07  Diabetes mellitus, type II--2000 Allergic rhinitis    MD roster: gyn - [facility] - scott cunningham neuro - [facility] nsurg - [facility]leman  hand - gramig  Review of Systems  The patient denies fever, chest pain, syncope, and difficulty walking.    Physical Exam  General:  Overweight white female in no distress - alert, well-developed, well-nourished, and cooperative to examination.    Lungs:  normal respiratory effort, no intercostal retractions or use of accessory muscles; normal breath sounds bilaterally - no crackles and no wheezes.    Heart:  normal rate, regular rhythm, no murmur, and no rub. BLE without edema.  Psych:  Oriented X3, memory intact for recent and remote, normally interactive, good eye contact, not anxious appearing, mildly depressed appearing, and not agitated.      Impression & Recommendations:  Problem # 1:  ANXIETY (ICD-300.00)  Her updated medication list for this problem includes:    Trazodone Hcl 100 Mg Tabs (Trazodone hcl) .[name]... Take 2 at bedtime    Xanax 0.5 Mg Tabs (Alprazolam) .[name]... Take 1 two times a day    Amitriptyline Hcl 10 Mg Tabs (Amitriptyline hcl) .[name]... 1 by mouth at bedtime  buspar and venlafax trial prev no help not manic recently but anxious about pain not really depressed cont f/u with psyc as ongoing (cunningham) for anxiety symptoms related to LUE symptoms - pt agrees to same no med changes today by me  Problem # 2:  DIABETES MELLITUS, TYPE II (ICD-250.00)  Her updated medication list for this problem includes:    Metformin Hcl 1000 Mg Tabs (Metformin hcl) .[name]... 1 by mouth two times a day    Onglyza 2.5 Mg  Tabs (Saxagliptin hcl) .[name]... 1 by mouth once daily  Labs Reviewed: Creat: 0.7 (05/27/2010)    Reviewed HgBA1c results: 7.9 (05/27/2010)  9.2 (12/21/2009)  Orders: TLB-A1C / Hgb A1C (Glycohemoglobin) (83036-A1C) Prescription Created Electronically [id number])  Problem # 3:  BACK PAIN, THORACIC REGION (ICD-724.1) Assessment: Unchanged  Her updated medication list for this problem includes:    Flexeril 10 Mg Tabs (Cyclobenzaprine hcl) .[name]... Take 1 as needed    Ultram 50 Mg Tabs (Tramadol hcl) .[name]... 1-2 by mouth every 6 hours as needed for pain    Meloxicam 15  Mg Tabs (Meloxicam) .[name]... 1 by mouth once daily  exam (mskel and neuro) show myofascial pain - no neuro deficits no hx injury ; +overuse improved but not resolved with massage and heat - cont  muscle relaxants and mobic for inflammation -  Problem # 4:  PARESTHESIA (ICD-782.0)  onset 01/2010 after injury to elbow prior eval neg for MS or neuropathic dz - reviewed eval by nsurg and neuro including MRI brain and c-spine reports summer 2011 normal NCS and hand eval fall 2011 by [facility] (see emr records)-  ?possible posttraumatic RSD - ongoing blocks/shots at [facility] ortho for same but would like second opinion - bertrand requested continue gabapentin for neuropathic pain and low dose amitriptl -   Orders: Misc. Referral (Misc. Ref)  Complete Medication List: 1)  Zyrtec Allergy 10 Mg Tabs (Cetirizine hcl) .... Take 1 tablet by mouth once a day 2)  Trazodone Hcl 100 Mg Tabs (Trazodone hcl) .... Take 2 at bedtime 3)  Xanax 0.5 Mg Tabs (Alprazolam) .... Take 1 two times a day 4)  Metformin Hcl 1000 Mg Tabs (Metformin hcl) .[name].. 1 by mouth two times a day 5)  Valtrex 1 Gm Tabs (Valacyclovir hcl) .[name].. 1 by mouth once daily as needed for fever blisters 6)  Docusate Sodium 100 Mg Tabs (Docusate sodium) .... Take 2 once daily 7)  Fiber 625 Mg Tabs (Calcium polycarbophil) .... Once daily 8)  Multivitamins Tabs (Multiple vitamin) .... Once  daily 9)  Coq10 100 Mg Caps (Coenzyme q10) .... Once daily 10)  B Complex 100 Tabs (B complex vitamins) .... Take 1 by mouth once daily 11)  Vitamin C 500 Mg Tabs (Ascorbic acid) .... Once daily 12)  Potassium 99 Mg Tabs (Potassium) .... Once daily 13)  Magnesium 250 Mg Tabs (Magnesium) .... Once daily 14)  Vitamin D3 2000 Unit Caps (Cholecalciferol) .... Take 1 by mouth once daily 15)  Gabapentin 600 Mg Tabs (Gabapentin) .[name].. 1 by mouth three times a day 16)  Amitriptyline Hcl 10 Mg Tabs (Amitriptyline hcl) .[name].. 1 by mouth at bedtime 17)  Flexeril 10 Mg Tabs (Cyclobenzaprine hcl) .... Take 1 as needed 18)  Dulcolax 5 Mg Tbec (Bisacodyl) .... Take 1 by mouth once daily 19)  Ketoprofen 5%/amtriptyline 2% Compound  .... Apply 1-2 grams 3-4 times a day 20)  Ultram 50 Mg Tabs (Tramadol hcl) .[name].. 1-2 by mouth every 6 hours as needed for pain 21)  Onglyza 2.5 Mg Tabs (Saxagliptin hcl) .[name].. 1 by mouth once daily 22)  Meloxicam 15 Mg Tabs (Meloxicam) .[name].. 1 by mouth once daily  Patient Instructions: 1)  it was good to see you today. 2)  test(s) ordered today - your results will be posted on the phone tree for review in 48-72 hours from the time of test completion; call [phone] and enter your 9 digit MRN (listed above on this page, just below your name); if any changes need to be made or there are abnormal results, you will be contacted directly.  3)  we'll make referral to dr. Yolanda Bonine for second opinion on RSD diagnosis. Our office will contact you regarding this appointment once made.  4)  start onglyza for diabetes - copay card provided and your prescriptions + refills (flexeril, meloxicam) have been electronically submitted to your pharmacy. Please take as directed. Contact our office if you believe you're having problems with the medication(s).  5)  Please schedule a follow-up appointment in 3 months for diabetes check, call sooner if problems.  Prescriptions: FLEXERIL 10 MG TABS  (CYCLOBENZAPRINE HCL) take 1 as needed  #30 x 3   Entered and Authorized by:   Newt Lukes MD   Signed by:   Newt Lukes MD on 09/07/2010   Method used:   Electronically to        [facility]* (retail)       [address]       [place], [place]  [address]       Ph: [phone]       Fax: [phone]   RxID:   [id number] MELOXICAM 15 MG TABS (MELOXICAM) 1 by mouth once daily  #30 x 6   Entered and Authorized by:   Newt Lukes MD   Signed by:   Newt Lukes MD on 09/07/2010   Method used:   Electronically to        [facility]* (retail)       [address]       [place], [place]  [address]       Ph: [phone]       Fax: [phone]   RxID:   [id number] ONGLYZA 2.5 MG TABS (SAXAGLIPTIN HCL) 1 by mouth once daily  #30 x 6   Entered and Authorized by:   Newt Lukes MD   Signed by:   Newt Lukes MD on 09/07/2010   Method used:   Electronically to        [facility]* (retail)       [address]       [place], [place]  [address]       Ph: [phone]       Fax: [phone]   RxID:   [id number]    Orders Added: 1)  TLB-A1C / Hgb A1C (Glycohemoglobin) [83036-A1C] 2)  Est. Patient Level IV [37106] 3)  Prescription Created Electronically [[id number]] 4)  Misc. Referral [Misc. Ref]

## 2010-09-23 NOTE — Assessment & Plan Note (Signed)
Summary: pt hurt back moving-lb   Vital Signs:  Patient profile:   40 year old female Height:      65 inches (165.10 cm) Weight:      221.8 pounds (100.82 kg) BMI:     37.04 O2 Sat:      97 % on Room air Temp:     98.5 degrees F (36.94 degrees C) oral Pulse rate:   90 / minute BP sitting:   112 / 72  (left arm) Cuff size:   large  Vitals Entered By: Orlan Leavens (November 11, 2009 11:30 AM)  O2 Flow:  Room air CC: Pt states injured back been having some pain in the middle of her back, Back pain Is Patient Diabetic? Yes Did you bring your meter with you today? No Pain Assessment Patient in pain? yes     Location: middle back area Type: aching   Primary Care Provider:  Cindee Salt MD  CC:  Pt states injured back been having some pain in the middle of her back and Back pain.  History of Present Illness:  Back Pain      This is a 40 year old woman who presents with Back pain.  The symptoms began 4 days ago.  The intensity is described as moderate.  moving and lifting activity in past month, especially weekends. has tried massage x 2 in past week, OTC ibuprofen and heating pads without relief of pain. No radiation of pain.  The patient denies fever, chills, weakness, loss of sensation, urinary incontinence, dysuria, inability to work, and inability to care for self.  The pain is located in the right low back and right thoracic back.  The pain began gradually, after lifting, and after overuse.  The pain is made worse by standing or walking and activity.  The pain is made better by inactivity and heat.    Current Medications (verified): 1)  Zyrtec Allergy 10 Mg  Tabs (Cetirizine Hcl) .... Take 1 Tablet By Mouth Once A Day 2)  Trazodone Hcl 100 Mg Tabs (Trazodone Hcl) .... Take 2 At Bedtime 3)  Xanax 0.5 Mg Tabs (Alprazolam) .... Take 1 Two Times A Day 4)  Metformin Hcl 500 Mg Tabs (Metformin Hcl) .... Take 1 By Mouth Qd  Allergies (verified): 1)  ! Morphine 2)  ! *  Latex  Past History:  Past Medical History: Bipolar--manic--5/07 Diabetes mellitus, type II--2000  Review of Systems  The patient denies fever, weight loss, chest pain, dyspnea on exertion, peripheral edema, headaches, incontinence, and difficulty walking.    Physical Exam  General:  alert.  NAD Lungs:  normal respiratory effort, no intercostal retractions or use of accessory muscles; normal breath sounds bilaterally - no crackles and no wheezes.    Heart:  normal rate, regular rhythm, no murmur, and no rub. BLE without edema. Msk:  back: full range of motion of lumbar spine. tender to palpation of paraverteb muscles R>L, mid thoracic>lumbar. Negative straight leg raise bilaterally. Deep tendon reflexes symmetrically intact at Achilles and patella, negative clonus. Sensation intact throughout all dermatomes in bilateral lower extremities. Full strength to manual muscle testing in all major muscule groups including EHL, anterior tibialis, gastrocnemius, quadriceps, and iliopsoas. Able to heel and toe walk without difficulty and ambulates with a normal gait.  Neurologic:  alert & oriented X3 and cranial nerves II-XII symetrically intact.  strength normal in all extremities, sensation intact to light touch, and gait normal. speech fluent without dysarthria or aphasia; follows commands  with good comprehension.    Impression & Recommendations:  Problem # 1:  BACK PAIN, THORACIC REGION (ICD-724.1)  exam (mskel and neuro) show myofascial pain - no neuro deficits no hx injury - +overuse improved but not resolved with massage and heat - cont same but add muscle relaxants and mobic for inflammation - no neuro deficits or radicular radiation to warrant pred pak - d/w pt who agrees with plan and will call if symptoms worse Her updated medication list for this problem includes:    Robaxin-750 750 Mg Tabs (Methocarbamol) .[name]... 1 by mouth three times a day as needed for muscle spasm    Meloxicam 15  Mg Tabs (Meloxicam) .[name]... 1 by mouth once daily x 5 days, then once daily as needed for pain  Discussed use of heat or ice, modified activities, medications, and stretching/strengthening exercises. Back care verbal instructions given. To be seen in 2 weeks if no improvement; sooner if worsening of symptoms.   Orders: Prescription Created Electronically [phone])  Complete Medication List: 1)  Zyrtec Allergy 10 Mg Tabs (Cetirizine hcl) .... Take 1 tablet by mouth once a day 2)  Trazodone Hcl 100 Mg Tabs (Trazodone hcl) .... Take 2 at bedtime 3)  Xanax 0.5 Mg Tabs (Alprazolam) .... Take 1 two times a day 4)  Metformin Hcl 500 Mg Tabs (Metformin hcl) .... Take 1 by mouth once daily 5)  Robaxin-750 750 Mg Tabs (Methocarbamol) .[name].. 1 by mouth three times a day as needed for muscle spasm 6)  Meloxicam 15 Mg Tabs (Meloxicam) .[name].. 1 by mouth once daily x 5 days, then once daily as needed for pain  Patient Instructions: 1)  it was good to see you today. 2)  will treat your pain with different muscle relaxants (robaxin) and antiinflammatory (mobic) as discussed - your prescriptions have been electronically submitted to your pharmacy. Please take as directed. Contact our office if you believe you're having problems with the medication(s).  3)  if your pain symptoms do not improve, or if become worse, please call for reevaluation - 4)  take it easy, continue massage and heat pads as tolerated Prescriptions: MELOXICAM 15 MG TABS (MELOXICAM) 1 by mouth once daily x 5 days, then once daily as needed for pain  #30 x 0   Entered and Authorized by:   Newt Lukes MD   Signed by:   Newt Lukes MD on 11/11/2009   Method used:   Electronically to        [facility] [address] [phone]* (retail)       [address] [place], [place]  [address]       Ph: [phone]       Fax: [phone]   RxID:   [id number] ROBAXIN-750 750 MG TABS (METHOCARBAMOL) 1 by mouth three times a day as needed for  muscle spasm  #30 x 0   Entered and Authorized by:   Newt Lukes MD   Signed by:   Newt Lukes MD on 11/11/2009   Method used:   Electronically to        [facility] [address] [phone]* (retail)       [address], [place]  [address]       Ph: [phone]       Fax: [phone]   RxID:   [id number]

## 2010-09-23 NOTE — Letter (Signed)
Summary: [facility]   Imported By: Sherian Rein 09/01/2010 10:57:23  _____________________________________________________________________  External Attachment:    Type:   Image     Comment:   External Document

## 2010-09-23 NOTE — Assessment & Plan Note (Signed)
Summary: fever blisters on lips/cd   Vital Signs:  Patient profile:   40 year old female Height:      65 inches (165.10 cm) Weight:      219.2 pounds (99.64 kg) O2 Sat:      97 % on Room air Temp:     98.7 degrees F (37.06 degrees C) oral Pulse rate:   100 / minute BP sitting:   104 / 58  (left arm) Cuff size:   large  Vitals Entered By: Orlan Leavens (Dec 21, 2009 1:16 PM)  O2 Flow:  Room air CC: FEVER BLISTERS Is Patient Diabetic? Yes Did you bring your meter with you today? No Pain Assessment Patient in pain? no        Primary Care Provider:  Newt Lukes, MD  CC:  FEVER BLISTERS.  History of Present Illness: here today with complaint of fever blister. onset of symptoms was 1 day ago. course has been sudden onset and now occurs in progressive pattern. problem precipitated by stress - symptom characterized as tingling sensation over right mouth - problem not associated with pain or fever. symptoms improved by Valtrex use. + prior hx of same symptoms - 2-3x/year   also c/o right sinus pressure located beneath and behind right eye - using OTC 12h sudafed and zyrtec w/o relief - no fever, no headache, no vision change  DM2 - has much improved in past years with considerable weight loss - prev >300# -reports compliance with ongoing medical treatment and no changes in medication dose or frequency. denies adverse side effects related to current therapy.  labs done through work - normal Cr  Clinical Review Panels:  Prevention   Last Colonoscopy:  Results: Polyp. Tubular Adenoma Results: Diverticulosis.       Results: Colitis.       Location:  [facility].  (06/06/2008)  Immunizations   Last Tetanus Booster:  Tdap (06/07/2006)  Diabetes Management   HgBA1C:  12.2 (12/15/2006)   Creatinine:  0.7 (12/15/2006)   Last Foot Exam:  yes (12/21/2009)   Current Medications (verified): 1)  Zyrtec Allergy 10 Mg  Tabs (Cetirizine Hcl) .... Take 1 Tablet By  Mouth Once A Day 2)  Trazodone Hcl 100 Mg Tabs (Trazodone Hcl) .... Take 2 At Bedtime 3)  Xanax 0.5 Mg Tabs (Alprazolam) .... Take 1 Two Times A Day 4)  Metformin Hcl 500 Mg Tabs (Metformin Hcl) .... Take 1 By Mouth Once Daily 5)  Robaxin-750 750 Mg Tabs (Methocarbamol) .[name].. 1 By Mouth Three Times A Day As Needed For Muscle Spasm 6)  Meloxicam 15 Mg Tabs (Meloxicam) .[name].. 1 By Mouth Once Daily X 5 Days, Then Once Daily As Needed For Pain 7)  Hcg Injection .... Take Two Times A Week  Allergies (verified): 1)  ! Morphine 2)  ! * Latex  Past History:  Past Medical History: Bipolar--manic--5/07 Diabetes mellitus, type II--2000 Allergic rhinitis  MD rooster: gyn - [facility]  Past Surgical History: Breast reduction 1997 Cholecystectomy 1998 IUD - 2009  Social History: Marital Status: Divorced Children: None Occupation: Engineer, materials - nurse paralegal previous smoker, quit 2010 Alcohol use-rare  Review of Systems  The patient denies fever, weight gain, decreased hearing, hoarseness, chest pain, syncope, headaches, abdominal pain, and suspicious skin lesions.    Physical Exam  General:  alert, well-developed, well-nourished, and cooperative to examination.   NAD Eyes:  vision grossly intact; pupils equal, round and reactive to light.  conjunctiva and lids  normal.    Ears:  normal pinnae bilaterally, without erythema, swelling, or tenderness to palpation. TMs clear, without effusion, or cerumen impaction. Hearing grossly normal bilaterally  Nose:  tenderness over right maxillary sinus Mouth:  teeth and gums in good repair; mucous membranes moist, without lesions or ulcers. oropharynx clear without exudate, min erythema. +yellow PND Lungs:  normal respiratory effort, no intercostal retractions or use of accessory muscles; normal breath sounds bilaterally - no crackles and no wheezes.    Heart:  normal rate, regular rhythm, no murmur, and no rub. BLE without edema.  Skin:   no active HSV lesions over site of tingling reported right mouth Psych:  Oriented X3, memory intact for recent and remote, normally interactive, good eye contact, not anxious appearing, not depressed appearing, and not agitated.     Diabetes Management Exam:    Foot Exam (with socks and/or shoes not present):       Sensory-Pinprick/Light touch:          Left medial foot (L-4): normal          Left dorsal foot (L-5): normal          Left lateral foot (S-1): normal          Right medial foot (L-4): normal          Right dorsal foot (L-5): normal          Right lateral foot (S-1): normal       Sensory-Monofilament:          Left foot: normal          Right foot: normal       Inspection:          Left foot: normal          Right foot: normal       Nails:          Left foot: normal          Right foot: normal   Impression & Recommendations:  Problem # 1:  FEVER BLISTER (ICD-054.9) Valtrex to use as needed - new e-rx done Orders: Prescription Created Electronically [id number])  Problem # 2:  DIABETES MELLITUS, TYPE II (ICD-250.00)  reports controlled with weight loss and current meds- check A1c now, pt to send copies of recent labs from health screen to verify normal Cr Her updated medication list for this problem includes:    Metformin Hcl 500 Mg Tabs (Metformin hcl) .[name]... Take 1 by mouth once daily  Orders: TLB-A1C / Hgb A1C (Glycohemoglobin) (83036-A1C) TLB-Microalbumin/Creat Ratio, Urine (82043-MALB)  Labs Reviewed: Creat: 0.7 (12/15/2006)    Reviewed HgBA1c results: 12.2 (12/15/2006)  Problem # 3:  SINUSITIS (ICD-473.9)  Her updated medication list for this problem includes:    Azithromycin 250 Mg Tabs (Azithromycin) .[name]... 2 tabs by mouth today, then 1 by mouth daily starting tomorrow  Take antibiotics for full duration. Discussed treatment options including indications for coronal CT scan of sinuses and ENT referral.   Problem # 4:  ALLERGIC RHINITIS (ICD-477.9)  Her  updated medication list for this problem includes:    Zyrtec Allergy 10 Mg Tabs (Cetirizine hcl) .[name]... Take 1 tablet by mouth once a day  Discussed use of allergy medications and environmental measures.   Complete Medication List: 1)  Zyrtec Allergy 10 Mg Tabs (Cetirizine hcl) .... Take 1 tablet by mouth once a day 2)  Trazodone Hcl 100 Mg Tabs (Trazodone hcl) .... Take 2 at bedtime 3)  Xanax 0.5 Mg  Tabs (Alprazolam) .... Take 1 two times a day 4)  Metformin Hcl 500 Mg Tabs (Metformin hcl) .... Take 1 by mouth once daily 5)  Robaxin-750 750 Mg Tabs (Methocarbamol) .[name].. 1 by mouth three times a day as needed for muscle spasm 6)  Meloxicam 15 Mg Tabs (Meloxicam) .[name].. 1 by mouth once daily x 5 days, then once daily as needed for pain 7)  Hcg Injection  .... Take two times a week 8)  Valtrex 1 Gm Tabs (Valacyclovir hcl) .[name].. 1 by mouth once daily as needed for fever blisters 9)  Azithromycin 250 Mg Tabs (Azithromycin) .... 2 tabs by mouth today, then 1 by mouth daily starting tomorrow  Patient Instructions: 1)  it was good to see you today.  2)  Vatrex and Zpack as discussed - your prescriptions have been electronically submitted to your pharmacy. Please take as directed. Contact our office if you believe you're having problems with the medication(s). 3)  test(s) ordered today - your results will be posted on the phone tree for review in 48-72 hours from the time of test completion; call [phone] and enter your 9 digit MRN (listed above on this page, just below your name); if any changes need to be made or there are abnormal results, you will be contacted directly.  4)  Please schedule a follow-up appointment in 3-6 months, sooner if problems.  Prescriptions: AZITHROMYCIN 250 MG TABS (AZITHROMYCIN) 2 tabs by mouth today, then 1 by mouth daily starting tomorrow  #6 x 0   Entered and Authorized by:   Newt Lukes MD   Signed by:   Newt Lukes MD on 12/21/2009   Method used:    Electronically to        [facility] [address] [phone]* (retail)       [address] [place], [place]  [address]       Ph: [phone]       Fax: [phone]   RxID:   [id number] METFORMIN HCL 500 MG TABS (METFORMIN HCL) take 1 by mouth once daily  #30 x 6   Entered and Authorized by:   Newt Lukes MD   Signed by:   Newt Lukes MD on 12/21/2009   Method used:   Electronically to        [facility] [address] [phone]* (retail)       [address] [place], [place]  [address]       Ph: [phone]       Fax: [phone]   RxID:   [id number] VALTREX 1 GM TABS (VALACYCLOVIR HCL) 1 by mouth once daily as needed for fever blisters  #12 x 1   Entered and Authorized by:   Newt Lukes MD   Signed by:   Newt Lukes MD on 12/21/2009   Method used:   Electronically to        [facility] [address] [phone]* (retail)       [address], [place]  [address]       Ph: [phone]       Fax: [phone]   RxID:   [id number]
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
## 2010-09-23 NOTE — Assessment & Plan Note (Signed)
Summary: MEDICATION CONSULT/MK   Vital Signs:  Patient Profile:   40 Years Old Female Weight:      226 pounds Temp:     99.8 degrees F oral Pulse rate:   100 / minute Pulse rhythm:   regular BP sitting:   120 / 90  (left arm) Cuff size:   large  Vitals Entered By: Mervin Hack                 Chief Complaint:  medication consult and hard time swallowing.  History of Present Illness: Has drastically changed diet cut out carbs and sugars weight has been stable over 1 year or so Had lost 25# but gained it back Checks sugars several times weekly Low 100's, high at some times also  Having bad anxiety Trouble at night especially---spasm and feels choking sensation No problems with hearburn Similar to years ago and she is certain that this is anxiety  Lots of stressors Lost pregnancy earlier this year Friend died, she helped at his death (glioblastoma)--still has nightmares Dad with 2 strokes  Doesn't feel depressed gets "wound up"  Manic some time ago-- ~2 years ago Used klonopin at night and that helped Sleep problems are worse now Buspar no help--on 15mg  two times a day for 3 weeks  Wellbutrin long ago when she was depressed doesn't remember how she did but thinks it was okay Zyprexa in past shot her blood sugar up      Current Allergies (reviewed today): ! MORPHINE ! * LATEX  Past Medical History:    Reviewed history from 05/14/2007 and no changes required:       Bipolar--manic--5/07       Diabetes mellitus, type II--2000  Past Surgical History:    Reviewed history from 05/14/2007 and no changes required:       Breast reduction 1997       Cholecystectomy 1998   Social History:    Reviewed history from 05/14/2007 and no changes required:       Marital Status: Divorced       Children: None       Occupation: Charity fundraiser -[employer]       Current Smoker       Alcohol use-rare    Review of Systems  The patient denies severe  indigestion/heartburn and prolonged cough.         appetite is normal periods irregular for the first time   Physical Exam  General:     alert.  NAD Mouth:     no erythema, no exudates, and no lesions.   Neck:     supple, full ROM, no masses, no thyromegaly, no carotid bruits, and no cervical lymphadenopathy.   Abdomen:     soft and non-tender.      Impression & Recommendations:  Problem # 1:  ANXIETY (ICD-300.00) Assessment: Deteriorated buspar no help not actively manic recently not really depressed  will try venlafaxine and as needed klonopin close follow up  The following medications were removed from the medication list:    Buspar 5 Mg Tabs (Buspirone hcl) .[name]... 1 once daily for 1 wk,  then 1 two times a day, then take 2 qam and 1 qpm, then 2 qd    Buspar 15 Mg Tabs (Buspirone hcl) .[name]... 1 two times a day  Her updated medication list for this problem includes:    Venlafaxine Hcl 75 Mg Xr24h-tab (Venlafaxine hcl) .[name]... 1 daily    Clonazepam 0.5 Mg Tabs (Clonazepam) .Marland KitchenMarland KitchenMarland KitchenMarland Kitchen  1 two times a day as needed for nerves or tight throat   Problem # 2:  THROAT PAIN (ICD-784.1) Assessment: New she is convinced it is anxiety related will treat the anxiety need to consider reflux but first comes on around 4-5PM and not related to eating  Problem # 3:  DIABETES MELLITUS, TYPE II (ICD-250.00) Assessment: Comment Only will recheck labs seems to have better control than in the past  Her updated medication list for this problem includes:    Metformin Hcl 1000 Mg Tabs (Metformin hcl) .[name]... Take 1 tablet by mouth two times a day  Orders: TLB-Renal Function Panel (80069-RENAL) TLB-CBC Platelet - w/Differential (85025-CBCD) TLB-Hepatic/Liver Function Pnl (80076-HEPATIC) TLB-TSH (Thyroid Stimulating Hormone) (84443-TSH) Venipuncture (36644) TLB-A1C / Hgb A1C (Glycohemoglobin) (83036-A1C)   Complete Medication List: 1)  Metformin Hcl 1000 Mg Tabs (Metformin hcl) .... Take 1  tablet by mouth two times a day 2)  Zyrtec Allergy 10 Mg Tabs (Cetirizine hcl) .... Take 1 tablet by mouth once a day 3)  Venlafaxine Hcl 75 Mg Xr24h-tab (Venlafaxine hcl) .[name].. 1 daily 4)  Clonazepam 0.5 Mg Tabs (Clonazepam) .[name].. 1 two times a day as needed for nerves or tight throat   Patient Instructions: 1)  Please schedule a follow-up appointment in 2-3  weeks.   Prescriptions: CLONAZEPAM 0.5 MG TABS (CLONAZEPAM) 1 two times a day as needed for nerves or tight throat  #60 x 0   Entered and Authorized by:   Cindee Salt MD   Signed by:   Cindee Salt MD on 07/28/2008   Method used:   Print then Give to Patient   RxID:   [id number] VENLAFAXINE HCL 75 MG XR24H-TAB (VENLAFAXINE HCL) 1 daily  #30 x 5   Entered and Authorized by:   Cindee Salt MD   Signed by:   Cindee Salt MD on 07/28/2008   Method used:   Print then Give to Patient   RxID:   [id number]  ] Current Allergies (reviewed today): ! MORPHINE ! * LATEX

## 2010-09-23 NOTE — Consult Note (Signed)
Summary: [facility]  [facility]   Imported By: Sherian Rein 06/12/2010 11:07:33  _____________________________________________________________________  External Attachment:    Type:   Image     Comment:   External Document

## 2010-09-23 NOTE — Assessment & Plan Note (Signed)
Summary: F/U FROM MON VISIT/DLO   Vital Signs:  Patient Profile:   40 Years Old Female Weight:      205 pounds Temp:     98.8 degrees F oral Pulse rate:   100 / minute BP sitting:   106 / 72  (left arm) Cuff size:   large  Vitals Entered By: Cooper Render (December 19, 2007 10:21 AM)                 Chief Complaint:  discuss options re: pregnancy.  History of Present Illness: Here to discuss options re pregnancy.  Has decided in concert wiwth female partner that they do not want a pregnancy at this time. Having pain in R groin intermitent for4-6d lasting 3-40min.  Doubles her over at times.  No bleeding.  Also having rash on arms and everywhere when scratches.  Taking zyrtec 10mg  daily and has added Loratadine 10-20qd --no help.    Current Allergies (reviewed today): ! MORPHINE     Review of Systems      See HPI   Physical Exam  General:     alert, well-developed, well-nourished, and well-hydrated.   Abdomen:     soft, normal bowel sounds, no distention, no masses, no guarding, no abdominal hernia, no inguinal hernia, no hepatomegaly, and no splenomegaly.   very tender in R groin--no mass Neurologic:     alert & oriented X3, sensation intact to light touch, and gait normal.   Skin:     long slender lines of eryth both lower arms, large macular papular patch on anterior chest wall--has been scratching Psych:     normally interactive and good eye contact.      Impression & Recommendations:  Problem # 1:  GROIN PAIN (ICD-789.09) Assessment: New newly dxed as pregnant, concern that this may be tubal pregnancy Orders: Radiology Referral (Radiology)   Problem # 2:  SKIN RASH (ICD-782.1) Assessment: New has had the rash when scratches for yrs--is dermagraphy, will use antihistamine as needed and ice to area when itches rather than scratching  Problem # 3:  AMENORRHEA (ICD-626.0) Assessment: Comment Only have given her the names and phone numbers of agencies or  docs who do abortions in the area--she will contact, has a copy of HCG discussed possible need for emotional counseling--listened  Complete Medication List: 1)  Metformin Hcl 1000 Mg Tabs (Metformin hcl) .... Take 1 tablet by mouth two times a day 2)  Zyrtec Allergy 10 Mg Tabs (Cetirizine hcl) .... Take 1 tablet by mouth once a day 3)  Xyzal 5 Mg Tabs (Levocetirizine dihydrochloride) .[name].. 1 once daily by mouth for rash   Patient Instructions: 1)  refer for U/S pelvis 2)  referal to dermatologist--rash    ] Prior Medications (reviewed today): METFORMIN HCL 1000 MG  TABS (METFORMIN HCL) Take 1 tablet by mouth two times a day ZYRTEC ALLERGY 10 MG  TABS (CETIRIZINE HCL) Take 1 tablet by mouth once a day Current Allergies (reviewed today): ! MORPHINE

## 2010-09-23 NOTE — Consult Note (Signed)
Summary: [facility]  [facility]   Imported By: Lester Lynchburg 05/26/2010 08:40:45  _____________________________________________________________________  External Attachment:    Type:   Image     Comment:   External Document

## 2010-09-23 NOTE — Assessment & Plan Note (Signed)
Summary: EARACHES/NWS   Vital Signs:  Patient profile:   40 year old female Height:      65 inches (165.10 cm) Weight:      208.12 pounds (94.60 kg) O2 Sat:      97 % on Room air Temp:     98.7 degrees F (37.06 degrees C) oral Pulse rate:   95 / minute BP sitting:   110 / 72  (left arm) Cuff size:   large  Vitals Entered By: Orlan Leavens (February 26, 2010 4:19 PM)  O2 Flow:  Room air CC: Earache, Ear pain Is Patient Diabetic? Yes Did you bring your meter with you today? No Pain Assessment Patient in pain? yes     Location: both ears Type: aching   Primary Care Provider:  Newt Lukes, MD  CC:  Earache and Ear pain.  History of Present Illness:  Ear Pain      This is a 40 year old woman who presents with Ear pain.  The symptoms began 3 days ago.  The severity is described as moderate.  hx same with recurrent infections.  The patient also reports sensation of fullness, fever, sinus pain, and nasal discharge, but denies ear discharge, hearing loss, tinnitus, and jaw click.  The pain is located in both ears.  The pain is described as constant and deep.  Associated symptoms include headache, popping or crackling sounds, and pressure.  The patient denies toothache, dizziness, and vertigo.  Patient's history includes chronic ear infections and hay fever/allergies.  Prior treatment has included decongestants.     DM2 hx - has much improved in past years with considerable weight loss - prev >300# -reports compliance with ongoing medical treatment and no changes in medication dose or frequency. denies adverse side effects related to current therapy.  labs done through work - normal Cr  obesity - following with weight loss clinic for hcg shots and phenteramine - ?transfer care same to here?   Current Medications (verified): 1)  Zyrtec Allergy 10 Mg  Tabs (Cetirizine Hcl) .... Take 1 Tablet By Mouth Once A Day 2)  Trazodone Hcl 100 Mg Tabs (Trazodone Hcl) .... Take 2 At Bedtime 3)   Xanax 0.5 Mg Tabs (Alprazolam) .... Take 1 Two Times A Day 4)  Metformin Hcl 500 Mg Tabs (Metformin Hcl) .... Take 1 By Mouth Two Times A Day 5)  Robaxin-750 750 Mg Tabs (Methocarbamol) .[name].. 1 By Mouth Three Times A Day As Needed For Muscle Spasm 6)  Meloxicam 15 Mg Tabs (Meloxicam) .[name].. 1 By Mouth Once Daily X 5 Days, Then Once Daily As Needed For Pain 7)  Hcg Injection .... Take Two Times A Week 8)  Valtrex 1 Gm Tabs (Valacyclovir Hcl) .[name].. 1 By Mouth Once Daily As Needed For Fever Blisters 9)  Phentermine Hcl 37.5 Mg Tabs (Phentermine Hcl) .... Take 1 By Mouth Once Daily 10)  Docusate Sodium 100 Mg Tabs (Docusate Sodium) .... Take 2 Once Daily 11)  Fiber 625 Mg Tabs (Calcium Polycarbophil) .... Once Daily 12)  Multivitamins  Tabs (Multiple Vitamin) .... Once Daily 13)  Coq10 100 Mg Caps (Coenzyme Q10) .... Once Daily 14)  B Complex 100  Tabs (B Complex Vitamins) .... Take 1 By Mouth Once Daily 15)  Vitamin C 500 Mg Tabs (Ascorbic Acid) .... Once Daily 16)  Potassium 99 Mg Tabs (Potassium) .... Once Daily 17)  Magnesium 250 Mg Tabs (Magnesium) .... Once Daily  Allergies (verified): 1)  ! Morphine 2)  ! *  Latex  Past History:  Past Medical History: Bipolar--manic--5/07 Diabetes mellitus, type II--2000 Allergic rhinitis  MD roster: gyn - [facility]  Review of Systems  The patient denies decreased hearing, syncope, and headaches.    Physical Exam  General:  alert, well-developed, well-nourished, and cooperative to examination.   NAD Ears:  normal pinnae bilaterally, without erythema, swelling, or tenderness to palpation. R>L TM erythematous and hazy, with min effusion, no cerumen impaction. Hearing grossly normal bilaterally  Mouth:  teeth and gums in good repair; mucous membranes moist, without lesions or ulcers. oropharynx clear without exudate, min erythema. +yellow PND Lungs:  normal respiratory effort, no intercostal retractions or use of accessory muscles; normal breath  sounds bilaterally - no crackles and no wheezes.    Heart:  normal rate, regular rhythm, no murmur, and no rub. BLE without edema.    Impression & Recommendations:  Problem # 1:  SINUSITIS (ICD-473.9)  Her updated medication list for this problem includes:    Augmentin 875-125 Mg Tabs (Amoxicillin-pot clavulanate) .[name]... 1 by mouth two times a day x 7 days  Take antibiotics for full duration. Discussed treatment options including indications for coronal CT scan of sinuses and ENT referral.   Orders: Prescription Created Electronically [phone])  Problem # 2:  OBESITY (ICD-278.00)  weight down >30lbs since tx begun with weight loss clinic - rec cont to follow there as i do not rx hcg shots and feel 3 mo in max duration for phenteramin in a year - pt agrees  Ht: 65 (02/26/2010)   Wt: 208.12 (02/26/2010)   BMI: 37.04 (11/11/2009)  Complete Medication List: 1)  Zyrtec Allergy 10 Mg Tabs (Cetirizine hcl) .... Take 1 tablet by mouth once a day 2)  Trazodone Hcl 100 Mg Tabs (Trazodone hcl) .... Take 2 at bedtime 3)  Xanax 0.5 Mg Tabs (Alprazolam) .... Take 1 two times a day 4)  Metformin Hcl 500 Mg Tabs (Metformin hcl) .... Take 1 by mouth two times a day 5)  Robaxin-750 750 Mg Tabs (Methocarbamol) .[name].. 1 by mouth three times a day as needed for muscle spasm 6)  Meloxicam 15 Mg Tabs (Meloxicam) .[name].. 1 by mouth once daily x 5 days, then once daily as needed for pain 7)  Hcg Injection  .... Take two times a week 8)  Valtrex 1 Gm Tabs (Valacyclovir hcl) .[name].. 1 by mouth once daily as needed for fever blisters 9)  Phentermine Hcl 37.5 Mg Tabs (Phentermine hcl) .... Take 1 by mouth once daily 10)  Docusate Sodium 100 Mg Tabs (Docusate sodium) .... Take 2 once daily 11)  Fiber 625 Mg Tabs (Calcium polycarbophil) .... Once daily 12)  Multivitamins Tabs (Multiple vitamin) .... Once daily 13)  Coq10 100 Mg Caps (Coenzyme q10) .... Once daily 14)  B Complex 100 Tabs (B complex vitamins) .... Take 1  by mouth once daily 15)  Vitamin C 500 Mg Tabs (Ascorbic acid) .... Once daily 16)  Potassium 99 Mg Tabs (Potassium) .... Once daily 17)  Magnesium 250 Mg Tabs (Magnesium) .... Once daily 18)  Augmentin 875-125 Mg Tabs (Amoxicillin-pot clavulanate) .[name].. 1 by mouth two times a day x 7 days  Patient Instructions: 1)  it was good to see you today.  2)  augmentin as discussed - your prescription has been electronically submitted to your pharmacy. Please take as directed. Contact our office if you believe you're having problems with the medication(s). 3)  Please schedule a follow-up appointment in 3-6 months, sooner if problems.  Prescriptions: AUGMENTIN 875-125 MG TABS (AMOXICILLIN-POT CLAVULANATE) 1 by mouth two times a day x 7 days  #14 x 0   Entered and Authorized by:   Newt Lukes MD   Signed by:   Newt Lukes MD on 02/26/2010   Method used:   Electronically to        [facility] [address] [phone]* (retail)       [address], [place]  [address]       Ph: [phone]       Fax: [phone]   RxID:   [id number]

## 2010-09-23 NOTE — Discharge Summary (Signed)
Summary: discharge summary  NAMETREASE, Amanda Vasquez                 ACCOUNT NO.:  [id number]      MEDICAL RECORD NO.:  [id number]          PATIENT TYPE:  INP      LOCATION:  1306                         FACILITY:  [facility]      PHYSICIAN:  Barbette Hair. Arlyce Dice, MD,FACGDATE OF BIRTH:  10/20/1952      DATE OF ADMISSION:  06/04/2008   DATE OF DISCHARGE:  06/09/2008                                  DISCHARGE SUMMARY      DISCHARGE DIAGNOSES:   1. Infectious enterocolitis.  Terminal ileum.  Biopsies revealed mild       active ileitis.  Colon biopsy showed severe active mucosal colitis.   2. Adenomatous colon polyps.   3. Acute kidney injury, resolved by the time of discharge.      DISCHARGE MEDICATIONS:   1. Lisinopril 40 mg daily.   2. Glucophage 1000 mg b.i.d.   3. Ativan 2 mg at bedtime.   4. Omeprazole 20 mg 2 in the morning, 1 in the evening.   5. Tizanidine 4 mg 1-2 times a day.   6. MS Contin 15 mg twice daily.   7. Omeprazole 20 mg 2 in the morning, 1 in the evening.   8. Flagyl 250 mg p.o. 4 times a day.   9. Xifaxan 400 mg twice a day for a total of 10 days.  (The patient       had received 4-5 days of Xifaxan in the hospital until prescription       finished, total of 10 days).   10.Robinul Forte 10 mg 1-2 tablets every 4-6 hours as needed.   11.Florastor 1 tablet twice a day for 2 weeks.      CONSULTATIONS:  None requested this admission.      PROCEDURES:  Colonoscopy with polypectomy and random colon biopsies.      HOSPITAL COURSE:  Amanda Vasquez was admitted June 04, 2008 for diarrhea   and dehydration.  The patient had been on vacation in [place] when she   developed the diarrhea on the 3rd or 4th day of her vacation.  The   patient took Cipro which she brought with her just in case it was   needed.  While on Cipro for several days the patient's diarrhea   resolved, but upon finishing the course of antibiotics the patient's   diarrhea returned.  She used Imodium for the  remainder of her vacation.   The patient is a nurse here in the endoscopy lab.  She was unable to   work secondary to profuse diarrhea.  The patient was admitted for volume   repletion and further evaluation.  On admission the patient's blood   pressure was stable at 129/81, but she was tachycardic with a heart rate   of 115 and had a low grade fever of 100.      ADMISSION LABORATORIES:  Labs were significant for a BUN of 56 and a   creatinine of 1.67.  The patient's CBC was normal.  The patient was   volume repleted, and the  next day her BUN was down to 33, creatinine   down to 1.02.  Her renal function normalized on the 2nd day of   admission.  Stool studies including O&P and 3 C. difficile studies were   negative.  Stool culture was negative as well.  Fecal lactoferrin was   positive.  Because of continued diarrhea despite antibiotics, the   patient was prepped and scheduled for a colonoscopy.  The patient had   mucosal abnormalities from the ileum to the rectum.  There was mild   friability and erythema present.  Additionally the patient had a 10 mm   sessile polyp removed in the sigmoid colon at the time of procedure.   Pathology as described in the discharge diagnoses.  By the 3rd day of   admission, the patient was feeling much better with less diarrhea.  On   the 4th day of admission her diet was advanced to a soft low residue   diet.  On June 09, 2008 the patient showed further improvement, and   was discharged home in stable condition on above medications.  The   patient was given an appointment to follow up with Dr. Marina Goodell, her   primary gastroenterologist, November the 25th at 9:30 a.m.  She was to   continue a low residue diabetic diet.  The patient was given enough   Flagyl and Xifaxan to complete a full 10-day course of the antibiotics.               Willette Cluster, NP               Barbette Hair. Arlyce Dice, MD,FACG   Electronically Signed         PG/MEDQ  D:   06/24/2008  T:  06/25/2008  Job:  [id number]

## 2010-09-23 NOTE — Assessment & Plan Note (Signed)
Summary: STRESS/RBH   Vital Signs:  Patient Profile:   40 Years Old Female Weight:      224.08 pounds Temp:     98.4 degrees F oral Pulse rate:   72 / minute BP sitting:   120 / 80  (left arm)  Pt. in pain?   no  Vitals Entered By: Jeremy Johann CMA (July 03, 2008 3:03 PM)                  Chief Complaint:  stress.  History of Present Illness: Here due to increased stress--got pregnant  ~Easter 09--aborted at 9 wks, friend died under her car/Hospice and 2 others over the summer, father had 2 strokes last mo--some residual mentally --has hx of pharyngeal spasms due to stress--have returned  --not sleeping well --feels that she has control over the anxiety, no depression, does not want meds or counseling, but knows needs meds.    Current Allergies (reviewed today): ! MORPHINE ! * LATEX  Past Medical History:    Reviewed history from 05/14/2007 and no changes required:       Bipolar--manic--5/07       NIDDM--2000   Social History:    Reviewed history from 05/14/2007 and no changes required:       Marital Status: Divorced       Children: None       Occupation: Charity fundraiser, [employer]       Current Smoker       Alcohol use-rare     Physical Exam  General:     alert, well-developed, well-nourished, and well-hydrated.   Psych:     normally interactive, flat affect, and slightly anxious.      Impression & Recommendations:  Problem # 1:  ANXIETY (ICD-300.00) Assessment: New dkscussion re choices of meds ansd counseling denies homocide or suicidal ideation agrees to Buspar 5 mg in a slow taper to control of signs see back in 61mo or as needed  Her updated medication list for this problem includes:    Buspar 5 Mg Tabs (Buspirone hcl) .[name]... 1 once daily for 1 wk,  then 1 two times a day, then take 2 qam and 1 qpm, then 2 qd   Complete Medication List: 1)  Metformin Hcl 1000 Mg Tabs (Metformin hcl) .... Take 1 tablet by mouth two times a day 2)  Zyrtec  Allergy 10 Mg Tabs (Cetirizine hcl) .... Take 1 tablet by mouth once a day 3)  Xyzal 5 Mg Tabs (Levocetirizine dihydrochloride) .[name].. 1 once daily by mouth for rash 4)  Buspar 5 Mg Tabs (Buspirone hcl) .[name].. 1 once daily for 1 wk,  then 1 two times a day, then take 2 qam and 1 qpm, then 2 qd   Patient Instructions: 1)  see back in 3 and 1/2 half weeks   Prescriptions: BUSPAR 5 MG TABS (BUSPIRONE HCL) 1 once daily for 1 wk,  then 1 two times a day, then take 2 qam and 1 qpm, then 2 qd  #70 x 1   Entered and Authorized by:   Gildardo Griffes FNP   Signed by:   Gildardo Griffes FNP on 07/03/2008   Method used:   Print then Give to Patient   RxID:   [id number]  ]

## 2010-09-23 NOTE — Procedures (Signed)
Summary: colonoscopy   Colonoscopy  Procedure date:  06/06/2008  Findings:      Results: Polyp. Tubular Adenoma Results: Diverticulosis.       Results: Colitis.       Location:  [facility]    Comments:      Repeat colonoscopy in 5 years.   Procedures Next Due Date:    Colonoscopy: 06/2013  Colonoscopy  Procedure date:  06/06/2008  Findings:      Results: Polyp. Tubular Adenoma Results: Diverticulosis.       Results: Colitis.       Location:  [facility]    Comments:      Repeat colonoscopy in 5 years.   Procedures Next Due Date:    Colonoscopy: 06/2013 Patient Name: Amanda Vasquez, Amanda Vasquez MRN: [id number] Procedure Procedures: Colonoscopy CPT: 95621.    with biopsy. CPT: Q5068410.    with polypectomy. CPT: A3573898.  Personnel: Endoscopist: Barbette Hair. Arlyce Dice, MD.  Patient Consent: Procedure, Alternatives, Risks and Benefits discussed, consent obtained,  Indications Symptoms: Diarrhea  History  Current Medications: Patient is not currently taking Coumadin.  Pre-Exam Physical: Performed Jun 06, 2008. Entire physical exam was normal. Cardio- pulmonary exam, Rectal exam, HEENT exam , Abdominal exam WNL.  Exam Exam: Extent of exam reached: Terminal Ileum, extent intended: Terminal Ileum.  The cecum was identified by appendiceal orifice and IC valve. Time for Withdrawl: 00:18:30. Colon retroflexion performed. ASA Classification: II. Tolerance: good.  Monitoring: Pulse and BP monitoring, Oximetry used. Supplemental O2 given. at 2 Liters.  Colon Prep Used none for colon prep.  Sedation Meds: Patient assessed and found to be appropriate for moderate (conscious) sedation. Fentanyl 125 mcg. given IV. Versed 10 mg. given IV. Benadryl 25 given IV.  Findings - DIVERTICULOSIS: Descending Colon to Sigmoid Colon. ICD9: Diverticulosis: 562.10. Comments: Scattered diverticula.  - MUCOSAL ABNORMALITY: Ileum to Rectum. Erythema present. Granularity absent,  bleeding absent, Haustral folds normal. Friability: mild. Activity level mild, Biopsy/Mucosal Abn. taken. ICD9: Colitis, Unspecified: 558.9. Comments: Diffuse, mild erythema throughout colon and terminal ileum with mild friability and edema.  POLYP: Sigmoid Colon, Maximum size: 10 mm. sessile polyp. Procedure:  snare with cautery, Polyp sent to pathology. ICD9: Colon Polyps: 211.3.   Assessment Abnormal examination, see findings above.  Diagnoses: 558.9: Colitis, Unspecified.  562.10: Diverticulosis.  211.3: Colon Polyps.   Events  Unplanned Interventions: No intervention was required.  Unplanned Events: There were no complications. Plans  Post Exam Instructions: Post sedation instructions given.  Medication Plan: Continue current medications.  Scheduling/Referral: Colonoscopy, to Wilhemina Bonito. Marina Goodell, MD, around Jun 06, 2013.    This report was created from the original endoscopy report, which was reviewed and signed by the above listed endoscopist.   cc:  Yancey Flemings, MD      Sonda Primes, MD

## 2010-09-23 NOTE — Progress Notes (Signed)
Summary: RESULTS  Phone Note Call from Patient Call back at Home Phone [phone]   Summary of Call: Patient is requesting results of MRI. Initial call taken by: Lamar Sprinkles, CMA,  May 14, 2010 4:45 PM  Follow-up for Phone Call        called patient with rsult and will ask [facility] to see about having her appt with Dr. Terrace Arabia or other neurologist moved up from Nov.  Follow-up by: Jacques Navy MD,  May 14, 2010 6:13 PM

## 2010-09-23 NOTE — Letter (Signed)
Summary: [facility]  [facility]   Imported By: Lester [place] 05/26/2010 08:39:26  _____________________________________________________________________  External Attachment:    Type:   Image     Comment:   External Document

## 2010-09-23 NOTE — Letter (Signed)
Summary: [facility]   Imported By: Lester [place] 08/25/2010 08:09:01  _____________________________________________________________________  External Attachment:    Type:   Image     Comment:   External Document

## 2010-09-23 NOTE — Progress Notes (Signed)
Summary: ? Referral  Phone Note Call from Patient Call back at [phone]   Caller: Patient Call For: Everrett Coombe, FNP Summary of Call: Pt is calling to find out if you know anyone that prescribes the RU486 and if sp can you refer her for this?  Initial call taken by: Sydell Axon,  Dec 26, 2007 4:40 PM  Follow-up for Phone Call        this smust be taken within 72 hrs after unprotected intercourse to work, is referred to as "the morning after " pill--is to late  Billie-Lynn Tyler Deis FNP  Dec 27, 2007 9:59 AM   Additional Follow-up for Phone Call Additional follow up Details #1::        pc to pt, given information. Additional Follow-up by: Cooper Render,  Dec 27, 2007 11:05 AM

## 2010-09-23 NOTE — Progress Notes (Signed)
Summary: EFFEXOR TO EXPENSIVE  Phone Note Call from Patient   Summary of Call: PT CALLED ,,,CAN NOT AFFORD THE MEDICATION EFFEXOR...WANT TO KNOW IF THERE IS SOMETHING SHE CAN TAKE, LESS EXPENSIVE.  PT DID NOT KNOW WHAT MEDS HER INS WILL COVER. SHE WILL CHECK AND CALL ME BACK.Marland KitchenMarland KitchenDaine Gip  July 29, 2008 12:21 PM  Initial call taken by: Daine Gip,  July 29, 2008 12:21 PM  Follow-up for Phone Call        PT RETURNED CALL--SAYS PER HER INS CO...CLONAZPAM IS COVERED UNDER PTS INS PLAN.Marland KitchenMarland KitchenDaine Gip  July 29, 2008 3:48 PM Follow-up by: Daine Gip,  July 29, 2008 3:48 PM  Additional Follow-up for Phone Call Additional follow up Details #1::        clonzepam is not right  venlafaxine is the generic of effexor and is available at a lower cost in most pharmacies. Please call her pharmacy and find out why the generic was not offered to her Additional Follow-up by: Cindee Salt MD,  July 29, 2008 6:20 PM    Additional Follow-up for Phone Call Additional follow up Details #2::    [facility] for patient to return call. DeShannon Smith CMA  July 30, 2008 8:07 AM Spoke to patient and was informed that she has a prescription deductable and can't afford any medications in that class. Patient states her co-pay per month on generic effexor is $69.00. Patient talked to [facility] at [facility] and the only other medications is generic wellbutrin and it's more expensive than the effexor. Patient wanted to let you know she wouldn't be gettng this filled until after the holidays.DeShannon Smith CMA  July 30, 2008 9:35 AM   Please have her start Pristiq--I left 6 weeks of samples. Pretty miuch the same thing as venlafaxine--just the new one since they lost the patent. It is more expensive but we get samples. Can just switch over to the venlafaxine once she can afford it. Fermin Yan Dia Crawford MD  July 30, 2008 9:47 AM   Additional Follow-up for Phone Call Additional follow up  Details #3:: Details for Additional Follow-up Action Taken: patient notified as instructed by telephone.Six packs of samples left up front for patient to pick-up. WJX#[id number] 12/2008. Additional Follow-up by: Mervin Hack CMA,  July 30, 2008 9:57 AM  New/Updated Medications: PRISTIQ 50 MG XR24H-TAB (DESVENLAFAXINE SUCCINATE) take 1 by mouth daily

## 2010-09-23 NOTE — Progress Notes (Signed)
Summary: regarding buspar  Phone Note Call from Patient Call back at Home Phone [phone]   Caller: Patient Call For: Amanda Salt MD Summary of Call: Pt has been taking buspar 10 mg two times a day x 2 weeks, this is not helping with her anxiety- she is having panic attacks- not sleeping, difficulty breathing.  Please advise on what she should do.  [facility]. Initial call taken by: Lowella Petties,  July 14, 2008 9:27 AM  Follow-up for Phone Call        It may take 3-4 weeks for buspar to be effective at relieving anxiety please ask her to increase evening dose to 15 mg for now will let Billie review  route to [facility] after notifying patient Follow-up by: Amanda Salt MD,  July 14, 2008 2:20 PM  Additional Follow-up for Phone Call Additional follow up Details #1::        Pt notified as instructed by telephone.  Pt advised that message will go to [facility] for her to review when she returns tomorrow. Additional Follow-up by: Sydell Axon,  July 14, 2008 3:13 PM    Additional Follow-up for Phone Call Additional follow up Details #2::    continue 15 mg of Buspar two times a day for 2 wks, if not controlled at this dose willl need to add another med--call in in 2 wks.  will call in a new Rx for 15 mg two times a day Buspar--not sure where.   Billie-Lynn Tyler Deis FNP  July 15, 2008 6:42 AM   Additional Follow-up for Phone Call Additional follow up Details #3:: Details for Additional Follow-up Action Taken: called pt advised her of instruction and she stated to call her rx in to [facility] [address] [phone].....called rx in to pharmacy Additional Follow-up by: Windell Norfolk,  July 15, 2008 8:58 AM  New/Updated Medications: BUSPAR 15 MG TABS (BUSPIRONE HCL) 1 two times a day   Prescriptions: BUSPAR 15 MG TABS (BUSPIRONE HCL) 1 two times a day  #60 x 1   Entered and Authorized by:   Gildardo Griffes FNP   Signed by:    Gildardo Griffes FNP on 07/15/2008   Method used:   Telephoned to ...         RxID:   [id number]

## 2010-09-23 NOTE — Progress Notes (Signed)
Summary: Allergies/hives  Phone Note Call from Patient Call back at [phone]   Caller: Patient Summary of Call: [place] and [place]  really bad allergy problems and hives.  Pt states the Zyrtec is not working.  The itching that she is having is horrbile.  Pharmacy - [phone]  Please let her know if you will call her something in for this. Initial call taken by: Sydell Axon,  November 28, 2007 1:31 PM  Follow-up for Phone Call        can try loratadine 10mg  1-2 in addition to the zyrtec and could try a 2nd zyrtec as long as it is not putting her to sleep If this doesn't work, she needs to be seen--she may need prednisone Follow-up by: Cindee Salt MD,  November 28, 2007 1:38 PM  Additional Follow-up for Phone Call Additional follow up Details #1::        called pt with instructions Additional Follow-up by: Wandra Mannan,  November 28, 2007 1:42 PM

## 2010-09-23 NOTE — Assessment & Plan Note (Signed)
Summary: DEHYDRATED / LFW   Vital Signs:  Patient Profile:   40 Years Old Female Weight:      230 pounds Temp:     98.9 degrees F oral Pulse rate:   95 / minute BP sitting:   121 / 82  (left arm) Cuff size:   regular  Vitals Entered By: Cooper Render (April 20, 2007 4:02 PM)             CBG Result 177     Chief Complaint:  watery diarrhea x 3 day's and numbness and tingling in arms and legs.  History of Present Illness: Here due to diarrhea x 3d nausea without vomiting., numbness and tingling in arms and legs.  CBGs running 70-170  Works nights, had 2 bananas last night and last voiding was about 12 hrs ago--did not void on waking this pm(being seen at 4:30pm).  CBG 143 at 11 pm 8/28  Current Allergies (reviewed today): ! MORPHINE     Review of Systems      See HPI   Physical Exam  General:     alert, well-developed, well-nourished, and well-hydrated.  NAD lying on exam table Head:     normocephalic, atraumatic, and no abnormalities observed.   Eyes:     pupils equal, pupils round, and no injection.   Mouth:     pharynx pink and moist.   Lungs:     normal respiratory effort and normal breath sounds.   Abdomen:     obese, bowel sounds quiet but present, not tender,no rebound tenderness or referred rebound Neurologic:     alert & oriented X3, sensation intact to light touch, and gait normal.   Skin:     turgor normal and color normal.   Psych:     normally interactive.      Impression & Recommendations:  Problem # 1:  GASTROENTERITIS/COLITIS, NON-INFCT NEC (ICD-558.9) Assessment: New due to the need for labs and proble IV fluids, she is referred to [facility] ER for eval.  She is not acute, will go in friend's car  Problem # 2:  DM W/COMPLICATION NOS, TYPE II (ICD-250.90)  Her updated medication list for this problem includes:    Lantus 100 Unit/ml Soln (Insulin glargine) .[name]... As directed    Metformin Hcl 1000 Mg Tabs (Metformin hcl) .[name]... Take 1  tablet by mouth two times a day CBG at 4:30pm was 177 Orders: Glucose, (CBG) (16109)   Complete Medication List: 1)  Lantus 100 Unit/ml Soln (Insulin glargine) .... As directed 2)  Singulair 10 Mg Tabs (Montelukast sodium) .... Take 1 tablet by mouth once a day 3)  Allegra 180 Mg Tabs (Fexofenadine hcl) .... Take 1 tablet by mouth once a day 4)  Klonopin 2 Mg Tabs (Clonazepam) .... Take 2  tablet by mouth at bedtime 5)  Metformin Hcl 1000 Mg Tabs (Metformin hcl) .... Take 1 tablet by mouth two times a day 6)  Zyrtec Allergy 10 Mg Tabs (Cetirizine hcl) .... Take 1 tablet by mouth once a day 7)  Flonase 50 Mcg/act Susp (Fluticasone propionate) .... As directed   Patient Instructions: 1)  transport by friend's auto to ER     Laboratory Results   Blood Tests     CBG Random: 177

## 2010-09-23 NOTE — Op Note (Signed)
Summary: [facility]  [facility]   Imported By: Lester Hoback 09/14/2010 11:34:27  _____________________________________________________________________  External Attachment:    Type:   Image     Comment:   External Document

## 2010-09-23 NOTE — Assessment & Plan Note (Signed)
Summary: POSSIBLE PREGNANCY WITH PAIN / LFW   Vital Signs:  Patient Profile:   40 Years Old Female Weight:      204 pounds Temp:     98.9 degrees F oral Pulse rate:   101 / minute BP sitting:   115 / 81  (right arm) Cuff size:   regular  Vitals Entered By: Cooper Render (December 17, 2007 8:10 AM)                 Chief Complaint:  R) abd & back pain and home pregnacy tests pos & neg.  History of Present Illness: Here due to abd and back pain, nausea and both pos nand neg pareganancy tests at home.  Has never been pregnant.  Last menstral period 11/12/07--anxious to know.  is late, has not been later before.  One partner. Is taking multivit with folic acid daily. Has developed nausea and diarrhea last few days--took 1 Imodiun yesterday--helped.    Current Allergies (reviewed today): ! MORPHINE     Review of Systems      See HPI   Physical Exam  General:     alert, well-developed, well-nourished, well-hydrated, and overweight-appearing.  NAD Neck:     normal carotid upstroke and no carotid bruits.   Lungs:     normal respiratory effort, no intercostal retractions, and no accessory muscle use.   Heart:     normal rate, regular rhythm, and no murmur.   Abdomen:     soft, normal bowel sounds, no distention, no masses, no guarding, no abdominal hernia, no inguinal hernia, no hepatomegaly, and no splenomegaly.  tender in mid-epigastrium and LUQ Neurologic:     alert & oriented X3 and gait normal.   Skin:     turgor normal, color normal, and no rashes.   Psych:     normally interactive, good eye contact, and slightly anxious.      Impression & Recommendations:  Problem # 1:  AMENORRHEA (ICD-626.0) Assessment: New will get serum pregnancy and notify this PM plans are unsure Orders: Venipuncture (02542) TLB-Preg Serum Quant (B-hCG) (84703-HCG-QN)   Problem # 2:  GASTROENTERITIS/COLITIS, NON-INFCT NEC (ICD-558.9) Assessment: New will use clear liquids  and BRAT diet next few days--advance slowly  Complete Medication List: 1)  Metformin Hcl 1000 Mg Tabs (Metformin hcl) .... Take 1 tablet by mouth two times a day 2)  Zyrtec Allergy 10 Mg Tabs (Cetirizine hcl) .... Take 1 tablet by mouth once a day     ] Prior Medications (reviewed today): METFORMIN HCL 1000 MG  TABS (METFORMIN HCL) Take 1 tablet by mouth two times a day ZYRTEC ALLERGY 10 MG  TABS (CETIRIZINE HCL) Take 1 tablet by mouth once a day Current Allergies (reviewed today): ! MORPHINE

## 2010-09-23 NOTE — Progress Notes (Signed)
  Phone Note Call from Patient   Summary of Call: Pt was recently seen in the ER. She was a previous pt of Dr Karle Starch but now works in [place] area. She called Dr Karle Starch office to be seen for numbness on her right side. They told her that she was Dr Diamantina Monks patient b/c she has seen Dr Felicity Coyer for acute visits. She then called our office, she was told to call Dr Karle Starch office. This was b/c she did not ever get a NEW PT apt with Dr Felicity Coyer. Dr Felicity Coyer has agreed to see pt for acute visit but is not avail this afternoon.   Pt is concerned that her numbness & tingling on her arm and leg are getting worse and she is having trouble working. She would like to see a Dr today. She is very frustrated by getting passed around by [facility] and unable to make an apt.   Would you be willing to see this patient or should she go to urgent care?  Initial call taken by: Lamar Sprinkles, CMA,  May 06, 2010 11:58 AM  Follow-up for Phone Call        OK PER Dr Debby Bud, pt scheduled for this afternoon Follow-up by: Lamar Sprinkles, CMA,  May 06, 2010 12:48 PM

## 2010-09-23 NOTE — Letter (Signed)
Summary: [facility]  [facility]   Imported By: Lester [place] 05/24/2010 08:06:47  _____________________________________________________________________  External Attachment:    Type:   Image     Comment:   External Document

## 2010-09-23 NOTE — Progress Notes (Signed)
Summary: please verify instructions on buspar  Phone Note From Pharmacy   Caller: [facility] [place]   [phone] Call For: Amanda Vasquez  Summary of Call: Please verify the instructions on the buspar, pt's written instructions call for her ending up with 2 twice a day, script ends up with 1 twice a day.  Please advise. Initial call taken by: Lowella Petties,  July 03, 2008 4:07 PM  Follow-up for Phone Call        should be 2 two times a day  Amanda Tyler Deis FNP  July 03, 2008 4:14 PM   Additional Follow-up for Phone Call Additional follow up Details #1::        Advised pharmacist Additional Follow-up by: Lowella Petties,  July 03, 2008 5:08 PM

## 2010-09-23 NOTE — Progress Notes (Signed)
Summary: ? ultrasound so soon  Phone Note Call from Patient Call back at Home Phone [phone]   Caller: Patient Call For: Amanda Vasquez - [place] Summary of Call: Had a pelvic ultrasound at [facility] to rule out an ectopic.  They said she wasn't even near far enough along to detect anything on ultrasound so now she wants to know why she was sent for this so early along and will she have to go back again later?  Patient is aware you are not in until Monday. Initial call taken by: Delilah Shan,  Dec 21, 2007 1:15 PM  Follow-up for Phone Call        sshe was sent because of her pain in the groin area.  no, she willl not need to go back again.  has she arranged for her abortion?  Amanda-Lynn Tyler Deis FNP  Dec 24, 2007 7:06 AM   Additional Follow-up for Phone Call Additional follow up Details #1::        given results.  cannot make arrangements until hcg levels are higher. above 3500.  pt still having pain, please advise.  pt does not know what to do.  Cooper Render  Dec 24, 2007 10:52 AM     Additional Follow-up for Phone Call Additional follow up Details #2::    will need repeat HCG at 5-6 wks overdue for period --needs to come in if stil having abd pain   Amanda-Lynn Tyler Deis FNP  Dec 24, 2007 1:59 PM   Additional Follow-up for Phone Call Additional follow up Details #3:: Details for Additional Follow-up Action Taken: pc to pt instructed needs appt for repeat lab & to see [facility] for abd pain. Additional Follow-up by: Cooper Render,  Dec 24, 2007 2:40 PM

## 2010-09-23 NOTE — Procedures (Signed)
Summary: colonoscopy   Colonoscopy  Procedure date:  05/09/2002  Findings:      Location:  [facility].  Results: Polyp.  Results: Diverticulosis.         Comments:      Repeat colonoscopy in 5 years.   Colonoscopy  Procedure date:  05/09/2002  Findings:      Location:  [facility].  Results: Polyp.  Results: Diverticulosis.         Comments:      Repeat colonoscopy in 5 years.  Patient Name: Amanda, Vasquez MRN: [id number] Procedure Procedures: Colonoscopy CPT: 54098.    with Hot Biopsy(s)CPT: [id number].  Personnel: Endoscopist: Wilhemina Bonito. Marina Goodell, MD.  Referred By: Linda Hedges Plotnikov, MD.  Exam Location: Exam performed in Endoscopy Suite.  Patient Consent: Procedure, Alternatives, Risks and Benefits discussed, consent obtained,  Indications  Evaluation of: Anemia Normocytic.  Average Risk Screening Routine.  History  Pre-Exam Physical: Performed May 09, 2002. Entire physical exam was normal.  Exam Exam: Extent of exam reached: Cecum, extent intended: Cecum.  The cecum was identified by appendiceal orifice and IC valve. Patient position: left side to back. Colon retroflexion performed. Images taken. ASA Classification: II. Tolerance: excellent.  Monitoring: Pulse and BP monitoring, Oximetry used. Supplemental O2 given.  Colon Prep Used Visicol for colon prep. Prep results: good.  Fluoroscopy: Fluoroscopy was not used.  Sedation Meds: Demerol 100 mg. given IV. Versed 5 mg. given IV.  Findings POLYP: Descending Colon, diminutive, sessile polyp. Procedure:  hot biopsy, removed, retrieved, Polyp sent to pathology. ICD9: Colon Polyps: 211.3.  - DIVERTICULOSIS: Sigmoid Colon. ICD9: Diverticulosis, Colon: 562.10.   Assessment Abnormal examination, see findings above.  Diagnoses: 562.10: Diverticulosis, Colon.  211.3: Colon Polyps.   Events  Unplanned Interventions: No intervention was required.  Unplanned Events: There were no  complications. Plans Patient Education: Patient given standard instructions for: Polyps.  Disposition: After procedure patient sent to recovery. After recovery patient sent home.  Scheduling/Referral: Colonoscopy, to Wilhemina Bonito. Marina Goodell, MD, in 3 years if polyp adenomatous; otherwise 5 years.,    This report was created from the original endoscopy report, which was reviewed and signed by the above listed endoscopist.   cc:  Sonda Primes, MD

## 2010-09-23 NOTE — Letter (Signed)
Summary: [facility]  [facility]   Imported By: Lester [place] 08/02/2010 08:45:08  _____________________________________________________________________  External Attachment:    Type:   Image     Comment:   External Document

## 2010-09-23 NOTE — Assessment & Plan Note (Signed)
Summary: ok per MD/SD   Vital Signs:  Patient profile:   40 year old female Height:      65 inches Weight:      211 pounds BMI:     35.24 O2 Sat:      97 % on Room air Temp:     97.9 degrees F oral Pulse rate:   101 / minute BP sitting:   122 / 84  (left arm) Cuff size:   large  Vitals Entered By: Bill Salinas CMA (May 06, 2010 3:00 PM)  O2 Flow:  Room air CC: ov for numbness/tingling on left side arm and shoulder area. She states she also has shooting pains that travel down her left arm/ ab   Primary Care Provider:  Newt Lukes, MD  CC:  ov for numbness/tingling on left side arm and shoulder area. She states she also has shooting pains that travel down her left arm/ ab.  History of Present Illness: Patient presents for ED follow-up. She has had paresthesia in the left hand after a blow to the elbow with possible ulnar nerve entrapement. she has had progressive paresthesia on the left side along with pain in the left leg and anterior chest wall. The paresthesia did affect the right UE and hand prompting ED evaluation. ED notes and all studies reviewed.   Current Medications (verified): 1)  Zyrtec Allergy 10 Mg  Tabs (Cetirizine Hcl) .... Take 1 Tablet By Mouth Once A Day 2)  Trazodone Hcl 100 Mg Tabs (Trazodone Hcl) .... Take 2 At Bedtime 3)  Xanax 0.5 Mg Tabs (Alprazolam) .... Take 1 Two Times A Day 4)  Metformin Hcl 500 Mg Tabs (Metformin Hcl) .... Take 1 By Mouth Two Times A Day 5)  Valtrex 1 Gm Tabs (Valacyclovir Hcl) .[name].. 1 By Mouth Once Daily As Needed For Fever Blisters 6)  Docusate Sodium 100 Mg Tabs (Docusate Sodium) .... Take 2 Once Daily 7)  Fiber 625 Mg Tabs (Calcium Polycarbophil) .... Once Daily 8)  Multivitamins  Tabs (Multiple Vitamin) .... Once Daily 9)  Coq10 100 Mg Caps (Coenzyme Q10) .... Once Daily 10)  B Complex 100  Tabs (B Complex Vitamins) .... Take 1 By Mouth Once Daily 11)  Vitamin C 500 Mg Tabs (Ascorbic Acid) .... Once Daily 12)   Potassium 99 Mg Tabs (Potassium) .... Once Daily 13)  Magnesium 250 Mg Tabs (Magnesium) .... Once Daily  Allergies (verified): 1)  ! Morphine 2)  ! 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  The patient denies anorexia, fever, weight loss, weight gain, decreased hearing, chest pain, syncope, dyspnea on exertion, prolonged cough, abdominal pain, severe indigestion/heartburn, incontinence, suspicious skin lesions, depression, and enlarged lymph nodes.    Physical Exam  General:  Overweight white female in no distress Head:  normocephalic and atraumatic.   Eyes:  pupils equal, pupils round, and corneas and lenses clear.   Neck:  supple, full ROM, and no thyromegaly.  Lungs:  normal respiratory effort and normal breath sounds.   Heart:  normal rate and regular rhythm.   Msk:  normal ROM, no joint swelling, no redness over joints, and no joint deformities.   Pulses:  2+ radial Neurologic:  alert & oriented X3, cranial nerves II-XII intact, strength normal in all extremities, sensation intact to light touch, sensation intact to pinprick, decrease sensation to deep vibratory sensation in the LE, gait normal, DTRs symmetrical and normal, and finger-to-nose normal.     Impression & Recommendations:  Problem # 1:  PARESTHESIA (ICD-782.0) Patient with migrating  paresthesia along with some clumsiness in gait and decreased sensation. Differential includes metabolic aabnormality vs Diabetic neuropathy vs  demyelinating disease. No evidence of stroke on CT done in ED.  Plan - lab to rule out metabolic abnormalities           MRI brain - evaluate for lesions associated with demyelination           Keep appointment with Dr. Terrace Arabia - neurology          gabapentin for neuropathic pain.  Orders: Radiology Referral (Radiology) TLB-B12 + Folate Pnl ([id number]/EXB) TLB-IBC Pnl (Iron/FE;Transferrin) (83550-IBC) TLB-TSH (Thyroid Stimulating Hormone) (84443-TSH) TLB-T4 (Thyrox), Free [phone]) T-RPR (Syphilis) [phone])  Complete Medication List: 1)  Zyrtec Allergy 10 Mg Tabs (Cetirizine hcl) .... Take 1 tablet by mouth once a day 2)  Trazodone Hcl 100 Mg Tabs (Trazodone hcl) .... Take 2 at bedtime 3)  Xanax 0.5 Mg Tabs (Alprazolam) .... Take 1 two times a day 4)  Metformin Hcl 500 Mg Tabs (Metformin hcl) .... Take 1 by mouth two times a day 5)  Valtrex 1 Gm Tabs (Valacyclovir hcl) .[name].. 1 by mouth once daily as needed for fever blisters 6)  Docusate Sodium 100 Mg Tabs (Docusate sodium) .... Take 2 once daily 7)  Fiber 625 Mg Tabs (Calcium polycarbophil) .... Once daily 8)  Multivitamins Tabs (Multiple vitamin) .... Once daily 9)  Coq10 100 Mg Caps (Coenzyme q10) .... Once daily 10)  B Complex 100 Tabs (B complex vitamins) .... Take 1 by mouth once daily 11)  Vitamin C 500 Mg Tabs (Ascorbic acid) .... Once daily 12)  Potassium 99 Mg Tabs (Potassium) .... Once daily 13)  Magnesium 250 Mg Tabs (Magnesium) .... Once daily 14)  Gabapentin 300 Mg Caps (Gabapentin) .[name].. 1 by mouth at bedtime x 5, 1 by mouth two times a day x 5, 1 by mouth three times a day   Patient: Amanda Vasquez Note: All result statuses are Final unless otherwise noted.  Tests: (1) B12 + Folate Panel (B12/FOL)   Vitamin B12               731 pg/mL                   211-911    Folate                    6.6 ng/mL     Deficient  0.4 - 3.4 ng/mL     Indeterminate  3.4 - 5.4 ng/mL     Normal  >5.4 ng/mL  Tests: (2) IBC Panel (IBC)   Iron                      65 ug/dL                    40-347   Transferrin  227.1 mg/dL                 161.0-960.4   Iron Saturation           20.4 %                      20.0-50.0  Tests: (3) TSH (TSH)   FastTSH                   1.14 uIU/mL                 0.35-5.50  Tests: (4) T4, Free (FT4R)   Free T4                   0.76 ng/dL                  0.60-1.60 Tests: (1) RPR Reflex to T.pallidum Ab, Total (54098)   RPR                       NON REAC                    NON REAC  Patient Instructions: 1)  Paresthesia - will commence further work-up: MRI brain with contrast-you will be contact by our patient care coordinators; la b - B12, TSH, FT4 and RPR. For discomfort will start gabapentin 300mg  at bedtime x 5 days, then two times a day x 5 days then three times a day. Follow-up appointment with Dr. Felicity Coyer in 3 weeks. All information will be forwarded on to Dr. Terrace Arabia.  Prescriptions: GABAPENTIN 300 MG CAPS (GABAPENTIN) 1 by mouth at bedtime x 5, 1 by mouth two times a day x 5, 1 by mouth three times a day  #45 x 1   Entered and Authorized by:   Jacques Navy MD   Signed by:   Jacques Navy MD on 05/06/2010   Method used:   Electronically to        [facility] [address] [phone]* (retail)       [address], [place]  [address]       Ph: [phone]       Fax: [phone]   RxID:   [id number]

## 2010-09-23 NOTE — Progress Notes (Signed)
  Phone Note From Other Clinic Call back at [phone]   Caller: Bariatric clinic Summary of Call: Sent fax asking [place] to do labs on patient CBC with diff, Lipid panel, chem 20 and thyroid panel  Follow-up for Phone Call        please call  due to poor diabetic control and no recent OV, I will only draw labs if she comes in to discuss her medical issues. If I do labs, I am responsible for follow up and she needs sig changes to her diabetic Rx based on her last blood work Follow-up by: Cindee Salt MD,  August 07, 2007 2:25 PM  Additional Follow-up for Phone Call Additional follow up Details #1::        nurse advised ..................................................................Marland KitchenLiane Comber  August 08, 2007 3:21 PM

## 2010-10-01 ENCOUNTER — Telehealth: Payer: Self-pay | Admitting: Internal Medicine

## 2010-10-07 NOTE — Letter (Signed)
Summary: Shayne Alken MD  Shayne Alken MD   Imported By: Lester [place] 09/27/2010 10:37:02  _____________________________________________________________________  External Attachment:    Type:   Image     Comment:   External Document

## 2010-10-07 NOTE — Progress Notes (Signed)
Summary: ?med dosage increase  Phone Note Call from Patient Call back at Home Phone [phone]   Caller: Patient Summary of Call: Pt states that Onglyza 2.5mg  is not helping with her CBG's, and that they are still elevated in the AM. Pt is wondering if dosage can be increase to 5mg -please advise-rx to [facility] in [place] Initial call taken by: Brenton Grills CMA Duncan Dull),  October 01, 2010 11:58 AM  Follow-up for Phone Call        yes - erx for 5mg  onglyza done -  Follow-up by: Newt Lukes MD,  October 01, 2010 1:03 PM  Additional Follow-up for Phone Call Additional follow up Details #1::        pt informed Additional Follow-up by: Brenton Grills CMA Duncan Dull),  October 01, 2010 1:11 PM    New/Updated Medications: ONGLYZA 5 MG TABS (SAXAGLIPTIN HCL) 1 by mouth once daily Prescriptions: ONGLYZA 5 MG TABS (SAXAGLIPTIN HCL) 1 by mouth once daily  #30 x 3   Entered and Authorized by:   Newt Lukes MD   Signed by:   Newt Lukes MD on 10/01/2010   Method used:   Electronically to        [facility]* (retail)       [address]       [place], [place]  [address]       Ph: [phone]       Fax: [phone]   RxID:   [id number]

## 2010-10-07 NOTE — Consult Note (Signed)
Summary: Iver Nestle MD/[facility]  Iver Nestle MD/[facility]   Imported By: Lester [place] 09/29/2010 10:29:49  _____________________________________________________________________  External Attachment:    Type:   Image     Comment:   External Document

## 2010-11-04 LAB — BASIC METABOLIC PANEL
BUN: 11 mg/dL (ref 6–23)
CO2: 23 mEq/L (ref 19–32)
Calcium: 9.4 mg/dL (ref 8.4–10.5)
Chloride: 107 mEq/L (ref 96–112)
Creatinine, Ser: 0.72 mg/dL (ref 0.4–1.2)
GFR calc Af Amer: >60 mL/min (ref >60)
GFR calc non Af Amer: >60 mL/min (ref >60)
Glucose, Bld: 122 mg/dL — ABNORMAL HIGH (ref 70–99)
Potassium: 3.9 mEq/L (ref 3.5–5.1)
Sodium: 137 mEq/L (ref 135–145)

## 2010-11-04 LAB — CBC
HCT: 38.9 % (ref 36.0–46.0)
Hemoglobin: 13.5 g/dL (ref 12.0–15.0)
MCH: 30.1 pg (ref 26.0–34.0)
MCHC: 34.7 g/dL (ref 30.0–36.0)
MCV: 86.6 fL (ref 78.0–100.0)
Platelets: 241 10*3/uL (ref 150–400)
RBC: 4.49 MIL/uL (ref 3.87–5.11)
RDW: 12.4 % (ref 11.5–15.5)
WBC: 6.2 10*3/uL (ref 4.0–10.5)

## 2010-12-06 ENCOUNTER — Other Ambulatory Visit: Payer: Self-pay | Admitting: *Deleted

## 2010-12-06 MED ORDER — AMITRIPTYLINE HCL 10 MG PO TABS: 10.0000 mg | ORAL_TABLET | Freq: Every day | ORAL | Status: DC

## 2010-12-06 NOTE — Telephone Encounter (Signed)
Faxed back script to [facility] pharmacy @ [phone]

## 2010-12-07 ENCOUNTER — Other Ambulatory Visit: Payer: Self-pay | Admitting: Internal Medicine

## 2011-01-11 ENCOUNTER — Encounter: Payer: Self-pay | Admitting: Internal Medicine

## 2011-01-11 DIAGNOSIS — E785 Hyperlipidemia, unspecified: Secondary | ICD-10-CM

## 2011-02-22 ENCOUNTER — Other Ambulatory Visit: Payer: Self-pay | Admitting: Internal Medicine

## 2011-03-07 ENCOUNTER — Other Ambulatory Visit (INDEPENDENT_AMBULATORY_CARE_PROVIDER_SITE_OTHER): Payer: 59

## 2011-03-07 ENCOUNTER — Ambulatory Visit (INDEPENDENT_AMBULATORY_CARE_PROVIDER_SITE_OTHER): Payer: 59 | Admitting: Internal Medicine

## 2011-03-07 ENCOUNTER — Encounter: Payer: Self-pay | Admitting: Internal Medicine

## 2011-03-07 VITALS — BP 122/70 | HR 87 | Temp 98.2°F | Ht 65.0 in | Wt 230.0 lb

## 2011-03-07 DIAGNOSIS — E119 Type 2 diabetes mellitus without complications: Secondary | ICD-10-CM

## 2011-03-07 DIAGNOSIS — G9050 Complex regional pain syndrome I, unspecified: Secondary | ICD-10-CM

## 2011-03-07 LAB — HEMOGLOBIN A1C: Hgb A1c MFr Bld: 10.5 % — ABNORMAL HIGH (ref 4.6–6.5)

## 2011-03-07 MED ORDER — LIRAGLUTIDE 18 MG/3ML ~~LOC~~ SOLN: 1.2000 mg | Freq: Every day | SUBCUTANEOUS | Status: DC

## 2011-03-07 MED ORDER — INSULIN PEN NEEDLE 32G X 5 MM MISC: Status: DC

## 2011-03-07 NOTE — Progress Notes (Signed)
Subjective:    Patient ID: Amanda Vasquez, female    DOB: Sep 04, 1970, 40 y.o.   MRN: [id number]  HPI  complains of weight gain and constipation  Also reviewed chronic medical issues:  Chronic LUE pain and parasthesia - RSD tx ongoing with [place] [facility] ([facility]); negative neuro eval for neuropathy - onset 02/2010 following trauma to elbow - seen in ED for same at time of injury. progressive paresthesia on left side. has been seen by nsurg, ortho including hand and neuro - MRI brain w/o MS signs per neuro, mri c-spine normal - normal NCS and hand eval 06/07/10 - s/p nerve block shots and ablation for same at [facility] [facility] - now on chronic, high dose narcotics for same  DM2 - reports compliance with ongoing medical treatment and no changes in medication dose or frequency. denies adverse side effects related to current therapy. checks cbg 2-3x/wk - weight gain reviewed and indescretion with diet   bipolar mania - not tol of antipsyc meds in past - feels anxiety is exac by stress from medical illness - sees scott cunningham every 6 mo, no visit since trauma to elbow - reports compliance with ongoing medical treatment and no changes in medication dose or frequency. denies adverse side effects related to current therapy. inc depression from unexplained pain symptoms (see above)   Past Medical History  Diagnosis Date  . DIABETES MELLITUS, TYPE II   . ALLERGIC RHINITIS   . ANXIETY   . PARESTHESIA   . OBESITY   . AMENORRHEA   . DISORDER, BIPOLAR, ATYPICAL MANIC   . DYSLIPIDEMIA     Review of Systems  Respiratory: Negative for shortness of breath.   Cardiovascular: Negative for chest pain.  Gastrointestinal: Positive for constipation.  Neurological: Negative for headaches.      Objective:   Physical Exam BP 122/70  Pulse 87  Temp(Src) 98.2 F (36.8 C) (Oral)  Ht 5\' 5"  (1.651 m)  Wt 230 lb (104.327 kg)  BMI 38.27 kg/m2  SpO2 97% Wt Readings from Last 3 Encounters:  03/07/11 230 lb (104.327 kg)    09/07/10 228 lb (103.42 kg)  07/27/10 222 lb 6.4 oz (100.88 kg)   Physical Exam  Constitutional: She is overweight; oriented to person, place, and time. She appears well-developed and well-nourished. No distress.  Cardiovascular: Normal rate, regular rhythm and normal heart sounds.  No murmur heard. No BLE edema. Pulmonary/Chest: Effort normal and breath sounds normal. No respiratory distress. She has no wheezes.  Neurological: She is alert and oriented to person, place, and time. No cranial nerve deficit. Coordination normal.  Psychiatric: She has a normal mood and affect. Her behavior is normal. Judgment and thought content normal.   Lab Results  Component Value Date   WBC 6.2 04/30/2010   HGB 13.5 04/30/2010   HCT 38.9 04/30/2010   PLT 241 04/30/2010   CHOL 219* 05/27/2010   TRIG 266.0* 05/27/2010   HDL 56.00 05/27/2010   LDLDIRECT 132.4 05/27/2010   NA 132* 05/27/2010   K 4.4 05/27/2010   CL 100 05/27/2010   CREATININE 0.7 05/27/2010   BUN 16 05/27/2010   CO2 23 05/27/2010   TSH 1.14 05/06/2010   HGBA1C 8.8* 09/07/2010   MICROALBUR 0.1 05/27/2010       Assessment & Plan:  See problem list. Medications and labs reviewed today.  RSD - unresponsive to injections, ablation; reviewed potential med problems such as gastroparesis and memory problems with chronic high dose narcotic use and encouraged use of  fewer meds if pain will allow - follows with pain mgmt dr. Ethelene Hal at [facility] for same

## 2011-03-07 NOTE — Assessment & Plan Note (Signed)
Sub op control by hx - cbgs 150-200 Stop onglyza, start victoza and continue metformin Check a1c now and every 3months

## 2011-03-07 NOTE — Assessment & Plan Note (Signed)
L elbow injury with subsquent pain -  ongoing tx for RSD by [place] [facility] dr. Ethelene Hal - s/p injections, ablation and chronic narcotic tx The current medical regimen is effective;  continue present plan and medications.

## 2011-03-07 NOTE — Patient Instructions (Signed)
It was good to see you today. Stop onglyza and start victoza- sample and instructions given today - use 1/2 dose (0.6mg  daily x 1 week to help minimize and side effects, then increase to 1.2 mg daily) - Your prescription(s) have been submitted to your pharmacy. Please take as directed and contact our office if you believe you are having problem(s) with the medication(s). Test(s) ordered today. Your results will be called to you after review (48-72hours after test completion). If any changes need to be made, you will be notified at that time. Please schedule followup in 3-4 months to monitor weight and diabetes, call sooner if problems.

## 2011-04-21 ENCOUNTER — Other Ambulatory Visit: Payer: Self-pay | Admitting: Internal Medicine

## 2011-06-03 LAB — DIFFERENTIAL
Basophils Absolute: 0.1
Basophils Relative: 2 — ABNORMAL HIGH
Eosinophils Absolute: 0.3
Eosinophils Relative: 3
Lymphocytes Relative: 47 — ABNORMAL HIGH
Lymphs Abs: 4 — ABNORMAL HIGH
Monocytes Absolute: 0.4
Monocytes Relative: 5
Neutro Abs: 3.7
Neutrophils Relative %: 43

## 2011-06-03 LAB — CBC
HCT: 39.6
Hemoglobin: 13.6
MCHC: 34.3
MCV: 83.1
Platelets: 314
RBC: 4.76
RDW: 12.9
WBC: 8.5

## 2011-06-03 LAB — COMPREHENSIVE METABOLIC PANEL
ALT: 18
AST: 18
Albumin: 3.5
Alkaline Phosphatase: 60
BUN: 6
CO2: 25
Calcium: 9
Chloride: 105
Creatinine, Ser: 0.63
GFR calc Af Amer: >60
GFR calc non Af Amer: >60
Glucose, Bld: 133 — ABNORMAL HIGH
Potassium: 3.6
Sodium: 137
Total Bilirubin: 0.8
Total Protein: 6.5

## 2011-06-03 LAB — URINALYSIS, ROUTINE W REFLEX MICROSCOPIC
Bilirubin Urine: NEGATIVE
Glucose, UA: NEGATIVE
Hgb urine dipstick: NEGATIVE
Ketones, ur: NEGATIVE
Nitrite: NEGATIVE
Protein, ur: NEGATIVE
Specific Gravity, Urine: 1.013
Urobilinogen, UA: 0.2
pH: 6.5

## 2011-06-03 LAB — PREGNANCY, URINE: Preg Test, Ur: NEGATIVE

## 2011-06-03 LAB — LIPASE, BLOOD: Lipase: 23

## 2011-06-22 ENCOUNTER — Other Ambulatory Visit: Payer: Self-pay | Admitting: Internal Medicine

## 2011-06-22 ENCOUNTER — Encounter: Payer: Self-pay | Admitting: Internal Medicine

## 2011-06-22 ENCOUNTER — Other Ambulatory Visit (INDEPENDENT_AMBULATORY_CARE_PROVIDER_SITE_OTHER): Payer: 59

## 2011-06-22 ENCOUNTER — Ambulatory Visit (INDEPENDENT_AMBULATORY_CARE_PROVIDER_SITE_OTHER): Payer: 59 | Admitting: Internal Medicine

## 2011-06-22 VITALS — BP 130/78 | HR 95 | Temp 98.4°F | Ht 65.0 in | Wt 213.8 lb

## 2011-06-22 DIAGNOSIS — G9050 Complex regional pain syndrome I, unspecified: Secondary | ICD-10-CM

## 2011-06-22 DIAGNOSIS — E119 Type 2 diabetes mellitus without complications: Secondary | ICD-10-CM

## 2011-06-22 DIAGNOSIS — Z0000 Encounter for general adult medical examination without abnormal findings: Secondary | ICD-10-CM

## 2011-06-22 DIAGNOSIS — Z23 Encounter for immunization: Secondary | ICD-10-CM

## 2011-06-22 LAB — CBC WITH DIFFERENTIAL/PLATELET
Basophils Absolute: 0 10*3/uL (ref 0.0–0.1)
Basophils Relative: 0.3 % (ref 0.0–3.0)
Eosinophils Absolute: 0.4 10*3/uL (ref 0.0–0.7)
Eosinophils Relative: 3.9 % (ref 0.0–5.0)
HCT: 40.2 % (ref 36.0–46.0)
Hemoglobin: 13.4 g/dL (ref 12.0–15.0)
Lymphocytes Relative: 47.2 % — ABNORMAL HIGH (ref 12.0–46.0)
Lymphs Abs: 4.3 10*3/uL — ABNORMAL HIGH (ref 0.7–4.0)
MCHC: 33.2 g/dL (ref 30.0–36.0)
MCV: 88.5 fl (ref 78.0–100.0)
Monocytes Absolute: 0.5 10*3/uL (ref 0.1–1.0)
Monocytes Relative: 5.5 % (ref 3.0–12.0)
Neutro Abs: 3.9 10*3/uL (ref 1.4–7.7)
Neutrophils Relative %: 43.1 % (ref 43.0–77.0)
Platelets: 285 10*3/uL (ref 150.0–400.0)
RBC: 4.54 Mil/uL (ref 3.87–5.11)
RDW: 13.7 % (ref 11.5–14.6)
WBC: 9.1 10*3/uL (ref 4.5–10.5)

## 2011-06-22 LAB — LIPID PANEL
Cholesterol: 210 mg/dL — ABNORMAL HIGH (ref 0–200)
HDL: 71.1 mg/dL (ref >39.00)
Total CHOL/HDL Ratio: 3
Triglycerides: 83 mg/dL (ref 0.0–149.0)
VLDL: 16.6 mg/dL (ref 0.0–40.0)

## 2011-06-22 LAB — HEPATIC FUNCTION PANEL
ALT: 22 U/L (ref 0–35)
AST: 18 U/L (ref 0–37)
Albumin: 4.1 g/dL (ref 3.5–5.2)
Alkaline Phosphatase: 56 U/L (ref 39–117)
Bilirubin, Direct: 0 mg/dL (ref 0.0–0.3)
Total Bilirubin: 0.4 mg/dL (ref 0.3–1.2)
Total Protein: 7.2 g/dL (ref 6.0–8.3)

## 2011-06-22 LAB — TSH: TSH: 1.04 u[IU]/mL (ref 0.35–5.50)

## 2011-06-22 LAB — BASIC METABOLIC PANEL
BUN: 11 mg/dL (ref 6–23)
CO2: 24 mEq/L (ref 19–32)
Calcium: 9.6 mg/dL (ref 8.4–10.5)
Chloride: 103 mEq/L (ref 96–112)
Creatinine, Ser: 0.7 mg/dL (ref 0.4–1.2)
GFR: 96.28 mL/min (ref >60.00)
Glucose, Bld: 98 mg/dL (ref 70–99)
Potassium: 4.1 mEq/L (ref 3.5–5.1)
Sodium: 136 mEq/L (ref 135–145)

## 2011-06-22 LAB — HEMOGLOBIN A1C: Hgb A1c MFr Bld: 7.6 % — ABNORMAL HIGH (ref 4.6–6.5)

## 2011-06-22 LAB — LDL CHOLESTEROL, DIRECT: Direct LDL: 138.8 mg/dL

## 2011-06-22 MED ORDER — VALACYCLOVIR HCL 1 G PO TABS: 1000.0000 mg | ORAL_TABLET | Freq: Every day | ORAL | Status: DC | PRN

## 2011-06-22 NOTE — Patient Instructions (Signed)
It was good to see you today. Test(s) ordered today. Your results will be called to you after review (48-72hours after test completion). If any changes need to be made, you will be notified at that time. Health Maintenance reviewed - flu shot today - all up to date.  we'll make referral to Dr. Vear Clock for pain managment. Our office will contact you regarding appointment(s) once made. Work with [facility] until then Medications reviewed, no changes at this time. Please schedule followup in 3-4 months for diabetes mellitus check, call sooner if problems.

## 2011-06-22 NOTE — Assessment & Plan Note (Signed)
fasting cbgs 130-140 Stopped onglyza 02/2011 as ineffective  02/2011 started victoza and continued metformin Check a1c now and every 3months Lab Results  Component Value Date   HGBA1C 10.5* 03/07/2011

## 2011-06-22 NOTE — Progress Notes (Signed)
Subjective:    Patient ID: Amanda Vasquez, female    DOB: 04-07-1971, 40 y.o.   MRN: [id number]  HPI  patient is here today for annual physical. Patient feels well and has no complaints.   also reviewed chronic medical issues:  Chronic LUE pain and parasthesia - RSD tx ongoing with [place] [facility] ([facility]); negative neuro eval for neuropathy - onset 02/2010 following trauma to elbow - seen in ED for same at time of injury. progressive paresthesia on left side. has been seen by nsurg, ortho including hand and neuro - MRI brain w/o MS signs per neuro, mri c-spine normal - normal NCS and hand eval 06/07/10 - s/p nerve block shots and ablation for same at [facility] [facility] - now on chronic, high dose narcotics for same - ?change to different pain clinic  DM2 - on victoza + metformin since 02/2011 - onglyza ineffective prev - reports compliance with ongoing medical treatment and no changes in medication dose or frequency. denies adverse side effects related to current therapy. checks cbg 2-3x/wk -  bipolar mania - not tol of antipsyc meds in past - feels anxiety is exac by stress from medical illness - sees scott cunningham every 6 mo, no visit since trauma to elbow - reports compliance with ongoing medical treatment and no changes in medication dose or frequency. denies adverse side effects related to current therapy. inc depression from pain symptoms (see above)   Past Medical History  Diagnosis Date  . DIABETES MELLITUS, TYPE II   . ALLERGIC RHINITIS   . ANXIETY   . PARESTHESIA   . OBESITY   . AMENORRHEA   . DISORDER, BIPOLAR, ATYPICAL MANIC   . DYSLIPIDEMIA   . RSD (reflex sympathetic dystrophy)    Family History  Problem Relation Age of Onset  . Adopted: Yes   History  Substance Use Topics  . Smoking status: Former Smoker    Types: Cigarettes, Cigars    Quit date: 08/22/2008  . Smokeless tobacco: Not on file   Comment: Divorced, lives with boyfriend  . Alcohol Use: Yes     Rare     Review of  Systems  Respiratory: Negative for shortness of breath.   Cardiovascular: Negative for chest pain.  Neurological: Negative for headaches.      Objective:   Physical Exam  BP 130/78  Pulse 95  Temp(Src) 98.4 F (36.9 C) (Oral)  Ht 5\' 5"  (1.651 m)  Wt 213 lb 12.8 oz (96.979 kg)  BMI 35.58 kg/m2  SpO2 96% Wt Readings from Last 3 Encounters:  06/22/11 213 lb 12.8 oz (96.979 kg)  03/07/11 230 lb (104.327 kg)  09/07/10 228 lb (103.42 kg)   Constitutional: She is overweight but appears well-developed and well-nourished. No distress.  HENT: Head: Normocephalic and atraumatic. Ears: B TMs ok, no erythema or effusion; Nose: Nose normal.  Mouth/Throat: Oropharynx is clear and moist. No oropharyngeal exudate.  Eyes: Conjunctivae and EOM are normal. Pupils are equal, round, and reactive to light. No scleral icterus.  Neck: Normal range of motion. Neck supple. No JVD present. No thyromegaly present.  Cardiovascular: Normal rate, regular rhythm and normal heart sounds.  No murmur heard. No BLE edema. Pulmonary/Chest: Effort normal and breath sounds normal. No respiratory distress. She has no wheezes.  Abdominal: Soft. Bowel sounds are normal. She exhibits no distension. There is no tenderness. no masses Musculoskeletal: Normal range of motion, no joint effusions. No gross deformities Neurological: She is alert and oriented to person, place, and time.  No cranial nerve deficit. Coordination normal.  Skin: Skin is warm and dry. No rash noted. No erythema.  Psychiatric: She has a normal mood and affect. Her behavior is normal. Judgment and thought content normal.     Lab Results  Component Value Date   WBC 6.2 04/30/2010   HGB 13.5 04/30/2010   HCT 38.9 04/30/2010   PLT 241 04/30/2010   CHOL 219* 05/27/2010   TRIG 266.0* 05/27/2010   HDL 56.00 05/27/2010   LDLDIRECT 132.4 05/27/2010   ALT 18 04/20/2007   AST 18 04/20/2007   NA 132* 05/27/2010   K 4.4 05/27/2010   CL 100 05/27/2010   CREATININE 0.7  05/27/2010   BUN 16 05/27/2010   CO2 23 05/27/2010   TSH 1.14 05/06/2010   HGBA1C 10.5* 03/07/2011   MICROALBUR 0.1 05/27/2010       Assessment & Plan:  CPX - v70.0 - Patient has been counseled on age-appropriate routine health concerns for screening and prevention. These are reviewed and up-to-date. Immunizations are up-to-date or declined. Labs ordered and will be reviewed.  Also see problem list. Medications and labs reviewed today.

## 2011-06-22 NOTE — Assessment & Plan Note (Signed)
L elbow injury with subsquent pain summer 2011 - ongoing tx for RSD by dr. Ethelene Hal - s/p injections, ablation and chronic narcotic tx reviewed potential med problems such as gastroparesis and memory problems with chronic high dose narcotic use and encouraged use of fewer meds if pain will allow -  Would like to change from [facility] to different provider - refer to Dr. Vear Clock done

## 2011-06-23 ENCOUNTER — Other Ambulatory Visit: Payer: Self-pay | Admitting: *Deleted

## 2011-06-23 DIAGNOSIS — E119 Type 2 diabetes mellitus without complications: Secondary | ICD-10-CM

## 2011-06-23 MED ORDER — LIRAGLUTIDE 18 MG/3ML ~~LOC~~ SOLN: 1.2000 mg | Freq: Every day | SUBCUTANEOUS | Status: DC

## 2011-06-23 NOTE — Telephone Encounter (Signed)
Called pt with lab results. Pt stated she needed refill on her victoza sending refill to pharmacy....06/23/11@2 :39pm/LMB

## 2011-09-21 ENCOUNTER — Ambulatory Visit (INDEPENDENT_AMBULATORY_CARE_PROVIDER_SITE_OTHER): Payer: 59 | Admitting: Internal Medicine

## 2011-09-21 ENCOUNTER — Other Ambulatory Visit (INDEPENDENT_AMBULATORY_CARE_PROVIDER_SITE_OTHER): Payer: 59

## 2011-09-21 ENCOUNTER — Encounter: Payer: Self-pay | Admitting: Internal Medicine

## 2011-09-21 DIAGNOSIS — E119 Type 2 diabetes mellitus without complications: Secondary | ICD-10-CM

## 2011-09-21 DIAGNOSIS — G9050 Complex regional pain syndrome I, unspecified: Secondary | ICD-10-CM

## 2011-09-21 DIAGNOSIS — E785 Hyperlipidemia, unspecified: Secondary | ICD-10-CM

## 2011-09-21 LAB — HEMOGLOBIN A1C: Hgb A1c MFr Bld: 7.4 % — ABNORMAL HIGH (ref 4.6–6.5)

## 2011-09-21 MED ORDER — ATORVASTATIN CALCIUM 10 MG PO TABS: 10.0000 mg | ORAL_TABLET | Freq: Every day | ORAL | Status: DC

## 2011-09-21 NOTE — Assessment & Plan Note (Signed)
L elbow injury with subsquent pain summer 2011 - ?thalamic source of pain s/p tx for RSD by dr. Ethelene Hal - s/p injections, ablation and chronic narcotic tx>unimproved reviewed potential med problems such as gastroparesis and memory problems with chronic high dose narcotic use and encouraged use of fewer meds if pain will allow -  Changed from [facility] to [place] fall 2012

## 2011-09-21 NOTE — Assessment & Plan Note (Signed)
Will start low dose atorva to get LDL<100 as reports optho saw microvasc retinal changes on eye exam fall 2012

## 2011-09-21 NOTE — Assessment & Plan Note (Signed)
fasting cbgs 130-140 Stopped onglyza 02/2011 as ineffective  02/2011 started victoza and continued metformin Check a1c now and every 3 months, titrate meds as needed Recheck micro alb/cr and consider ARB/ACEI if needed  Lab Results  Component Value Date   HGBA1C 7.6* 06/22/2011

## 2011-09-21 NOTE — Patient Instructions (Signed)
It was good to see you today. Test(s) ordered today. Your results will be called to you after review (48-72hours after test completion). If any changes need to be made, you will be notified at that time. Medications reviewed, start generic Lipitor low dose today -  Your prescription(s) have been submitted to your pharmacy. Please take as directed and contact our office if you believe you are having problem(s) with the medication(s). Please schedule followup in 3-4 months for diabetes mellitus check, call sooner if problems.

## 2011-09-21 NOTE — Progress Notes (Signed)
  Subjective:    Patient ID: Amanda Vasquez, female    DOB: 30-Aug-1970, 41 y.o.   MRN: [id number]  HPI  Here for follow up - reviewed chronic medical issues:  Chronic LUE pain and parasthesia - ?RSD vs other negative neuro eval for neuropathy - onset 02/2010 following trauma to elbow - seen in ED for same at time of injury. progressive paresthesia on left side. has been seen by nsurg, ortho including hand and neuro - MRI brain w/o MS signs per neuro, mri c-spine normal - normal NCS and hand eval 06/07/10 - s/p nerve block shots and ablation for same at [facility] [facility] - on chronic, high dose narcotics for same - now with Dr Vear Clock - improved with XR gabapentin  DM2 - on victoza + metformin since 02/2011 - onglyza ineffective prev - reports compliance with ongoing medical treatment and no changes in medication dose or frequency. denies adverse side effects related to current therapy. checks cbg 2-3x/wk -  bipolar mania - not tol of antipsyc meds in past - feels anxiety is exac by stress from medical illness - sees scott cunningham every 6 mo, no visit since trauma to elbow - reports compliance with ongoing medical treatment and no changes in medication dose or frequency. denies adverse side effects related to current therapy. inc depression from pain symptoms (see above)   Past Medical History  Diagnosis Date  . DIABETES MELLITUS, TYPE II   . ALLERGIC RHINITIS   . ANXIETY   . PARESTHESIA   . OBESITY   . AMENORRHEA   . DISORDER, BIPOLAR, ATYPICAL MANIC   . DYSLIPIDEMIA   . RSD (reflex sympathetic dystrophy)     Review of Systems  Respiratory: Negative for shortness of breath.   Cardiovascular: Negative for chest pain.  Neurological: Negative for headaches.      Objective:   Physical Exam  BP 112/72  Pulse 121  Temp(Src) 98.5 F (36.9 C) (Oral)  Wt 216 lb (97.977 kg)  SpO2 96% Wt Readings from Last 3 Encounters:  09/21/11 216 lb (97.977 kg)  06/22/11 213 lb 12.8 oz (96.979 kg)    03/07/11 230 lb (104.327 kg)   Constitutional: She is overweight but appears well-developed and well-nourished. No distress.  Neck: Normal range of motion. Neck supple. No JVD present. No thyromegaly present.  Cardiovascular: Normal rate, regular rhythm and normal heart sounds.  No murmur heard. No BLE edema. Pulmonary/Chest: Effort normal and breath sounds normal. No respiratory distress. She has no wheezes.  Psychiatric: She has a normal mood and affect. Her behavior is normal. Judgment and thought content normal.    Lab Results  Component Value Date   WBC 9.1 06/22/2011   HGB 13.4 06/22/2011   HCT 40.2 06/22/2011   PLT 285.0 06/22/2011   CHOL 210* 06/22/2011   TRIG 83.0 06/22/2011   HDL 71.10 06/22/2011   LDLDIRECT 138.8 06/22/2011   ALT 22 06/22/2011   AST 18 06/22/2011   NA 136 06/22/2011   K 4.1 06/22/2011   CL 103 06/22/2011   CREATININE 0.7 06/22/2011   BUN 11 06/22/2011   CO2 24 06/22/2011   TSH 1.04 06/22/2011   HGBA1C 7.6* 06/22/2011   MICROALBUR 0.1 05/27/2010       Assessment & Plan:  see problem list. Medications and labs reviewed today.

## 2011-09-23 ENCOUNTER — Other Ambulatory Visit: Payer: Self-pay | Admitting: *Deleted

## 2011-09-23 DIAGNOSIS — E119 Type 2 diabetes mellitus without complications: Secondary | ICD-10-CM

## 2011-09-23 MED ORDER — METFORMIN HCL 1000 MG PO TABS: 1000.0000 mg | ORAL_TABLET | Freq: Two times a day (BID) | ORAL | Status: DC

## 2011-09-23 MED ORDER — LIRAGLUTIDE 18 MG/3ML ~~LOC~~ SOLN: 1.8000 mg | Freq: Every day | SUBCUTANEOUS | Status: DC

## 2011-09-23 NOTE — Telephone Encounter (Signed)
Called pt no answer LMOM md response & recommendations. Sent both victoza & metformin to [facility]... 09/23/11@4 :55pm/LMB

## 2011-09-23 NOTE — Telephone Encounter (Signed)
Not resulted yet - please check with lab on status of these orders - will let pt know once we get results.... thanks

## 2011-09-23 NOTE — Telephone Encounter (Signed)
Pt call left msg on vm requesting results back form lab work.... 09/23/11@2 :00pm/LMB

## 2011-09-23 NOTE — Telephone Encounter (Signed)
Increase victoza dose to 1.8 - same metformin dose - can check urine test next time - thanks

## 2011-09-23 NOTE — Telephone Encounter (Signed)
Called lab spoke Amanda Vasquez pt didn't give urine specimen was waiting on her to bring back. A1C was 7.4 will fax that report... 09/23/11@4 :44pm/LMB

## 2011-09-26 ENCOUNTER — Other Ambulatory Visit: Payer: Self-pay | Admitting: *Deleted

## 2011-09-26 DIAGNOSIS — E119 Type 2 diabetes mellitus without complications: Secondary | ICD-10-CM

## 2011-09-26 MED ORDER — LIRAGLUTIDE 18 MG/3ML ~~LOC~~ SOLN: 1.8000 mg | Freq: Every day | SUBCUTANEOUS | Status: DC

## 2011-09-26 NOTE — Telephone Encounter (Signed)
Received fax stating pt is needing # 9ml for 1 month supply... 09/26/11@9 :21am/LMB

## 2012-01-28 ENCOUNTER — Other Ambulatory Visit: Payer: Self-pay | Admitting: Internal Medicine

## 2012-03-01 DIAGNOSIS — G379 Demyelinating disease of central nervous system, unspecified: Secondary | ICD-10-CM | POA: Insufficient documentation

## 2012-03-27 ENCOUNTER — Other Ambulatory Visit: Payer: Self-pay | Admitting: Internal Medicine

## 2012-05-19 ENCOUNTER — Other Ambulatory Visit: Payer: Self-pay | Admitting: Internal Medicine

## 2012-05-21 ENCOUNTER — Other Ambulatory Visit: Payer: Self-pay | Admitting: Internal Medicine

## 2012-05-30 ENCOUNTER — Telehealth: Payer: Self-pay | Admitting: Internal Medicine

## 2012-05-30 NOTE — Telephone Encounter (Signed)
Forward 8 pages from [facility] to Dr. Rene Paci for review on 05-30-12 ym

## 2012-08-29 ENCOUNTER — Other Ambulatory Visit: Payer: Self-pay | Admitting: Internal Medicine

## 2012-09-06 ENCOUNTER — Ambulatory Visit: Payer: 59 | Admitting: Internal Medicine

## 2012-09-12 ENCOUNTER — Other Ambulatory Visit (INDEPENDENT_AMBULATORY_CARE_PROVIDER_SITE_OTHER): Payer: 59

## 2012-09-12 ENCOUNTER — Encounter: Payer: Self-pay | Admitting: Internal Medicine

## 2012-09-12 ENCOUNTER — Ambulatory Visit (INDEPENDENT_AMBULATORY_CARE_PROVIDER_SITE_OTHER): Payer: 59 | Admitting: Internal Medicine

## 2012-09-12 VITALS — BP 132/80 | HR 113 | Temp 99.3°F | Ht 65.0 in | Wt 220.0 lb

## 2012-09-12 DIAGNOSIS — E119 Type 2 diabetes mellitus without complications: Secondary | ICD-10-CM

## 2012-09-12 DIAGNOSIS — Z0000 Encounter for general adult medical examination without abnormal findings: Secondary | ICD-10-CM

## 2012-09-12 DIAGNOSIS — Z23 Encounter for immunization: Secondary | ICD-10-CM

## 2012-09-12 DIAGNOSIS — G890 Central pain syndrome: Secondary | ICD-10-CM

## 2012-09-12 DIAGNOSIS — E785 Hyperlipidemia, unspecified: Secondary | ICD-10-CM

## 2012-09-12 LAB — HEPATIC FUNCTION PANEL
ALT: 23 U/L (ref 0–35)
AST: 20 U/L (ref 0–37)
Albumin: 4.3 g/dL (ref 3.5–5.2)
Alkaline Phosphatase: 60 U/L (ref 39–117)
Bilirubin, Direct: 0 mg/dL (ref 0.0–0.3)
Total Bilirubin: 0.5 mg/dL (ref 0.3–1.2)
Total Protein: 7.5 g/dL (ref 6.0–8.3)

## 2012-09-12 LAB — HEMOGLOBIN A1C: Hgb A1c MFr Bld: 7.2 % — ABNORMAL HIGH (ref 4.6–6.5)

## 2012-09-12 LAB — BASIC METABOLIC PANEL
BUN: 12 mg/dL (ref 6–23)
CO2: 22 mEq/L (ref 19–32)
Calcium: 9.6 mg/dL (ref 8.4–10.5)
Chloride: 103 mEq/L (ref 96–112)
Creatinine, Ser: 0.6 mg/dL (ref 0.4–1.2)
GFR: 107.89 mL/min (ref >60.00)
Glucose, Bld: 148 mg/dL — ABNORMAL HIGH (ref 70–99)
Potassium: 4.3 mEq/L (ref 3.5–5.1)
Sodium: 134 mEq/L — ABNORMAL LOW (ref 135–145)

## 2012-09-12 LAB — CBC WITH DIFFERENTIAL/PLATELET
Basophils Absolute: 0 10*3/uL (ref 0.0–0.1)
Basophils Relative: 0.4 % (ref 0.0–3.0)
Eosinophils Absolute: 0.2 10*3/uL (ref 0.0–0.7)
Eosinophils Relative: 2.6 % (ref 0.0–5.0)
HCT: 41.6 % (ref 36.0–46.0)
Hemoglobin: 13.8 g/dL (ref 12.0–15.0)
Lymphocytes Relative: 32.8 % (ref 12.0–46.0)
Lymphs Abs: 3 10*3/uL (ref 0.7–4.0)
MCHC: 33.2 g/dL (ref 30.0–36.0)
MCV: 86.5 fl (ref 78.0–100.0)
Monocytes Absolute: 0.4 10*3/uL (ref 0.1–1.0)
Monocytes Relative: 5 % (ref 3.0–12.0)
Neutro Abs: 5.3 10*3/uL (ref 1.4–7.7)
Neutrophils Relative %: 59.2 % (ref 43.0–77.0)
Platelets: 306 10*3/uL (ref 150.0–400.0)
RBC: 4.81 Mil/uL (ref 3.87–5.11)
RDW: 13.1 % (ref 11.5–14.6)
WBC: 9 10*3/uL (ref 4.5–10.5)

## 2012-09-12 LAB — LIPID PANEL
Cholesterol: 180 mg/dL (ref 0–200)
HDL: 78.4 mg/dL (ref >39.00)
LDL Cholesterol: 81 mg/dL (ref 0–99)
Total CHOL/HDL Ratio: 2
Triglycerides: 102 mg/dL (ref 0.0–149.0)
VLDL: 20.4 mg/dL (ref 0.0–40.0)

## 2012-09-12 LAB — MICROALBUMIN / CREATININE URINE RATIO
Creatinine,U: 19 mg/dL
Microalb Creat Ratio: 1.1 mg/g (ref 0.0–30.0)
Microalb, Ur: 0.2 mg/dL (ref 0.0–1.9)

## 2012-09-12 LAB — TSH: TSH: 0.69 u[IU]/mL (ref 0.35–5.50)

## 2012-09-12 MED ORDER — ATORVASTATIN CALCIUM 10 MG PO TABS: 10.0000 mg | ORAL_TABLET | Freq: Every day | ORAL | Status: DC

## 2012-09-12 MED ORDER — METFORMIN HCL 1000 MG PO TABS: 1000.0000 mg | ORAL_TABLET | Freq: Two times a day (BID) | ORAL | Status: DC

## 2012-09-12 MED ORDER — EXENATIDE 5 MCG/0.02ML ~~LOC~~ SOPN: 10.0000 ug | PEN_INJECTOR | Freq: Two times a day (BID) | SUBCUTANEOUS | Status: DC

## 2012-09-12 NOTE — Patient Instructions (Signed)
It was good to see you today. We have reviewed your prior records including labs and tests today Health Maintenance reviewed -pneumonia shot updated today- all other recommended immunizations and age-appropriate screenings are up-to-date. Test(s) ordered today. Your results will be released to MyChart (or called to you) after review, usually within 72hours after test completion. If any changes need to be made, you will be notified at that same time. Medications reviewed and updated. Will change Vicotza to Byetta for diabetes mellitus per insurance formulary. No other changes recommended Your prescription(s) have been submitted to your pharmacy. Please take as directed and contact our office if you believe you are having problem(s) with the medication(s). Work on lifestyle changes as discussed (low fat, low carb, increased protein diet; improved exercise efforts; weight loss) to control sugar, blood pressure and cholesterol levels and/or reduce risk of developing other medical problems. Look into [email or web address] or other type of food journal to assist you in this process. Please schedule followup in 6 months for diabetes mellitus and weight check, call sooner if problems. Health Maintenance, Females A healthy lifestyle and preventative care can promote health and wellness.  Maintain regular health, dental, and eye exams.   Eat a healthy diet. Foods like vegetables, fruits, whole grains, low-fat dairy products, and lean protein foods contain the nutrients you need without too many calories. Decrease your intake of foods high in solid fats, added sugars, and salt. Get information about a proper diet from your caregiver, if necessary.   Regular physical exercise is one of the most important things you can do for your health. Most adults should get at least 150 minutes of moderate-intensity exercise (any activity that increases your heart rate and causes you to sweat) each week. In addition, most adults  need muscle-strengthening exercises on 2 or more days a week.     Maintain a healthy weight. The body mass index (BMI) is a screening tool to identify possible weight problems. It provides an estimate of body fat based on height and weight. Your caregiver can help determine your BMI, and can help you achieve or maintain a healthy weight. For adults 20 years and older:   A BMI below 18.5 is considered underweight.   A BMI of 18.5 to 24.9 is normal.   A BMI of 25 to 29.9 is considered overweight.   A BMI of 30 and above is considered obese.   Maintain normal blood lipids and cholesterol by exercising and minimizing your intake of saturated fat. Eat a balanced diet with plenty of fruits and vegetables. Blood tests for lipids and cholesterol should begin at age 59 and be repeated every 5 years. If your lipid or cholesterol levels are high, you are over 50, or you are a high risk for heart disease, you may need your cholesterol levels checked more frequently. Ongoing high lipid and cholesterol levels should be treated with medicines if diet and exercise are not effective.   If you smoke, find out from your caregiver how to quit. If you do not use tobacco, do not start.   If you are pregnant, do not drink alcohol. If you are breastfeeding, be very cautious about drinking alcohol. If you are not pregnant and choose to drink alcohol, do not exceed 1 drink per day. One drink is considered to be 12 ounces (355 mL) of beer, 5 ounces (148 mL) of wine, or 1.5 ounces (44 mL) of liquor.   Avoid use of street drugs. Do not share needles with anyone.  Ask for help if you need support or instructions about stopping the use of drugs.   High blood pressure causes heart disease and increases the risk of stroke. Blood pressure should be checked at least every 1 to 2 years. Ongoing high blood pressure should be treated with medicines, if weight loss and exercise are not effective.   If you are 64 to 43 years old, ask  your caregiver if you should take aspirin to prevent strokes.   Diabetes screening involves taking a blood sample to check your fasting blood sugar level. This should be done once every 3 years, after age 44, if you are within normal weight and without risk factors for diabetes. Testing should be considered at a younger age or be carried out more frequently if you are overweight and have at least 1 risk factor for diabetes.   Breast cancer screening is essential preventative care for women. You should practice "breast self-awareness." This means understanding the normal appearance and feel of your breasts and may include breast self-examination. Any changes detected, no matter how small, should be reported to a caregiver. Women in their 38s and 30s should have a clinical breast exam (CBE) by a caregiver as part of a regular health exam every 1 to 3 years. After age 43, women should have a CBE every year. Starting at age 43, women should consider having a mammogram (breast X-ray) every year. Women who have a family history of breast cancer should talk to their caregiver about genetic screening. Women at a high risk of breast cancer should talk to their caregiver about having an MRI and a mammogram every year.   The Pap test is a screening test for cervical cancer. Women should have a Pap test starting at age 71. Between ages 22 and 13, Pap tests should be repeated every 2 years. Beginning at age 57, you should have a Pap test every 3 years as long as the past 3 Pap tests have been normal. If you had a hysterectomy for a problem that was not cancer or a condition that could lead to cancer, then you no longer need Pap tests. If you are between ages 63 and 76, and you have had normal Pap tests going back 10 years, you no longer need Pap tests. If you have had past treatment for cervical cancer or a condition that could lead to cancer, you need Pap tests and screening for cancer for at least 20 years after your  treatment. If Pap tests have been discontinued, risk factors (such as a new sexual partner) need to be reassessed to determine if screening should be resumed. Some women have medical problems that increase the chance of getting cervical cancer. In these cases, your caregiver may recommend more frequent screening and Pap tests.   The human papillomavirus (HPV) test is an additional test that may be used for cervical cancer screening. The HPV test looks for the virus that can cause the cell changes on the cervix. The cells collected during the Pap test can be tested for HPV. The HPV test could be used to screen women aged 43 years and older, and should be used in women of any age who have unclear Pap test results. After the age of 34, women should have HPV testing at the same frequency as a Pap test.   Colorectal cancer can be detected and often prevented. Most routine colorectal cancer screening begins at the age of 57 and continues through age 52. However, your  caregiver may recommend screening at an earlier age if you have risk factors for colon cancer. On a yearly basis, your caregiver may provide home test kits to check for hidden blood in the stool. Use of a small camera at the end of a tube, to directly examine the colon (sigmoidoscopy or colonoscopy), can detect the earliest forms of colorectal cancer. Talk to your caregiver about this at age 22, when routine screening begins. Direct examination of the colon should be repeated every 5 to 10 years through age 19, unless early forms of pre-cancerous polyps or small growths are found.   Hepatitis C blood testing is recommended for all people born from [birth year] through 1966 and any individual with known risks for hepatitis C.   Practice safe sex. Use condoms and avoid high-risk sexual practices to reduce the spread of sexually transmitted infections (STIs). Sexually active women aged 51 and younger should be checked for Chlamydia, which is a common sexually  transmitted infection. Older women with new or multiple partners should also be tested for Chlamydia. Testing for other STIs is recommended if you are sexually active and at increased risk.   Osteoporosis is a disease in which the bones lose minerals and strength with aging. This can result in serious bone fractures. The risk of osteoporosis can be identified using a bone density scan. Women ages 44 and over and women at risk for fractures or osteoporosis should discuss screening with their caregivers. Ask your caregiver whether you should be taking a calcium supplement or vitamin D to reduce the rate of osteoporosis.   Menopause can be associated with physical symptoms and risks. Hormone replacement therapy is available to decrease symptoms and risks. You should talk to your caregiver about whether hormone replacement therapy is right for you.   Use sunscreen with a sun protection factor (SPF) of 30 or greater. Apply sunscreen liberally and repeatedly throughout the day. You should seek shade when your shadow is shorter than you. Protect yourself by wearing long sleeves, pants, a wide-brimmed hat, and sunglasses year round, whenever you are outdoors.   Notify your caregiver of new moles or changes in moles, especially if there is a change in shape or color. Also notify your caregiver if a mole is larger than the size of a pencil eraser.   Stay current with your immunizations.  Document Released: 02/21/2011 Document Revised: 10/31/2011 Document Reviewed: 02/21/2011 [facility] Patient Information 2013 [place], [place].   Exercise to Lose Weight Exercise and a healthy diet may help you lose weight. Your doctor may suggest specific exercises. EXERCISE IDEAS AND TIPS  Choose low-cost things you enjoy doing, such as walking, bicycling, or exercising to workout videos.   Take stairs instead of the elevator.   Walk during your lunch break.   Park your car further away from work or school.   Go to a  gym or an exercise class.   Start with 5 to 10 minutes of exercise each day. Build up to 30 minutes of exercise 4 to 6 days a week.   Wear shoes with good support and comfortable clothes.   Stretch before and after working out.   Work out until you breathe harder and your heart beats faster.   Drink extra water when you exercise.   Do not do so much that you hurt yourself, feel dizzy, or get very short of breath.  Exercises that burn about 150 calories:  Running 1  miles in 15 minutes.   Playing volleyball for  45 to 60 minutes.   Washing and waxing a car for 45 to 60 minutes.   Playing touch football for 45 minutes.   Walking 1  miles in 35 minutes.   Pushing a stroller 1  miles in 30 minutes.   Playing basketball for 30 minutes.   Raking leaves for 30 minutes.   Bicycling 5 miles in 30 minutes.   Walking 2 miles in 30 minutes.   Dancing for 30 minutes.   Shoveling snow for 15 minutes.   Swimming laps for 20 minutes.   Walking up stairs for 15 minutes.   Bicycling 4 miles in 15 minutes.   Gardening for 30 to 45 minutes.   Jumping rope for 15 minutes.   Washing windows or floors for 45 to 60 minutes.  Document Released: 09/10/2010 Document Revised: 10/31/2011 Document Reviewed: 09/10/2010 [facility] Patient Information 2013 [place], [place].

## 2012-09-12 NOTE — Progress Notes (Signed)
Subjective:    Patient ID: Amanda Vasquez, female    DOB: [birth year], 42 y.o.   MRN: [id number]  HPI  patient is here today for annual physical. Patient feels well and has no complaints.  Also reviewed chronic medical issues:  Chronic LUE pain and parasthesia - "thalamic pain syndrome" per Dr Vear Clock (pain mgmt) negative neuro eval x 3 ([place], [place] and [place]) for neuropathy or MS: MRI brain 2011 without MS, mri c-spine normal - normal NCS and hand eval 06/07/10 Pain onset 02/2010 following trauma to elbow - seen in ED for same at time of injury. progressive paresthesia on left side. Also s/p eval by nsurg, ortho including hand specialist 05/2010 - s/p nerve block shots and ablation for same at [place] [facility] - remains on chronic, high dose narcotics for same - now follows with Dr Vear Clock - symptoms improved with XR gabapentin and periodic trigger point injections. 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  DM2 - on victoza + metformin since 02/2011 - prior trial onglyza ineffective - reports compliance with ongoing medical treatment and no changes in medication dose or frequency. denies adverse side effects related to current therapy. checks cbg 2-3x/wk -  bipolar mania - not tol of antipsyc meds in past - feels anxiety is caused by stress of medical illness - previously followed with scott cunningham every 6 mo, but no visit since trauma to elbow 02/2010 - reports compliance with ongoing medical treatment and no changes in medication dose or frequency. denies adverse side effects related to current therapy.  Past Medical History  Diagnosis Date  . DIABETES MELLITUS, TYPE II   . ALLERGIC RHINITIS   . ANXIETY   . PARESTHESIA   . OBESITY   . AMENORRHEA   . DISORDER, BIPOLAR, ATYPICAL MANIC   . DYSLIPIDEMIA   . RSD (reflex sympathetic dystrophy)   . Migraine    Family History  Problem Relation Age of Onset  . Adopted: Yes   History  Substance Use  Topics  . Smoking status: Former Smoker    Types: Cigarettes, Cigars    Quit date: 08/22/2008  . Smokeless tobacco: Not on file     Comment: Divorced, lives with boyfriend  . Alcohol Use: Yes     Comment: Rare    Review of Systems  Respiratory: Negative for shortness of breath.   Cardiovascular: Negative for chest pain.  Neurological: Negative for headaches.  Musculoskeletal: Negative for gait problem or joint swelling.  Skin: Negative for rash.  Neurological: Negative for dizziness or headache.  No other specific complaints in a complete review of systems (except as listed in HPI above).     Objective:   Physical Exam  BP 132/80  Pulse 113  Temp 99.3 F (37.4 C) (Oral)  Ht 5\' 5"  (1.651 m)  Wt 220 lb (99.791 kg)  BMI 36.61 kg/m2  SpO2 97% Wt Readings from Last 3 Encounters:  09/12/12 220 lb (99.791 kg)  09/21/11 216 lb (97.977 kg)  06/22/11 213 lb 12.8 oz (96.979 kg)   Constitutional: She is overweight, but appears well-developed and well-nourished. No distress.  HENT: Head: Normocephalic and atraumatic. Ears: B TMs ok, no erythema or effusion; Nose: Nose normal. Mouth/Throat: Oropharynx is clear and moist. No oropharyngeal exudate.  Eyes: Conjunctivae and EOM are normal. Pupils are equal, round, and reactive to light. No scleral icterus.  Neck: Normal range of motion. Neck supple. No JVD present. No thyromegaly present.  Cardiovascular: Normal rate, regular rhythm and normal heart sounds.  No murmur heard. No BLE edema. Pulmonary/Chest: Effort normal and breath sounds normal. No respiratory distress. She has no wheezes.  Abdominal: Soft. Bowel sounds are normal. She exhibits no distension. There is no tenderness. no masses Musculoskeletal: Normal range of motion, no joint effusions. No gross deformities Neurological: She is alert and oriented to person, place, and time. No cranial nerve deficit. Coordination normal. Hypersensitivity with pressure on L anterior shoulder  during CV exam causing pain spasm. Skin: Skin is warm and dry. No rash noted. No erythema.  Psychiatric: She has a normal mood and affect. Her behavior is normal. Judgment and thought content normal.    Lab Results  Component Value Date   WBC 9.1 06/22/2011   HGB 13.4 06/22/2011   HCT 40.2 06/22/2011   PLT 285.0 06/22/2011   CHOL 210* 06/22/2011   TRIG 83.0 06/22/2011   HDL 71.10 06/22/2011   LDLDIRECT 138.8 06/22/2011   ALT 22 06/22/2011   AST 18 06/22/2011   NA 136 06/22/2011   K 4.1 06/22/2011   CL 103 06/22/2011   CREATININE 0.7 06/22/2011   BUN 11 06/22/2011   CO2 24 06/22/2011   TSH 1.04 06/22/2011   HGBA1C 7.4* 09/21/2011   MICROALBUR 0.1 05/27/2010       Assessment & Plan:  CPX/v70.0 -Patient has been counseled on age-appropriate routine health concerns for screening and prevention. These are reviewed and up-to-date. Immunizations are up-to-date or declined. Labs and reviewed.   Also see problem list. Medications and labs reviewed today.

## 2012-09-12 NOTE — Assessment & Plan Note (Signed)
started low dose atorva 08/2011 to get LDL<100 as optho saw microvasc retinal changes on eye exam fall 2012 Recheck lipids annually and adjust as needed The current medical regimen is effective;  continue present plan and medications.

## 2012-09-12 NOTE — Assessment & Plan Note (Signed)
fasting cbgs 130-140 reviewed today Stopped onglyza 02/2011 as ineffective control 02/2011 started victoza and continued metformin Change victoza to Byetta BID per formulary - ex done Check a1c now and every 3 months, titrate meds as needed Recheck micro alb/cr and consider ARB/ACEI if needed  Lab Results  Component Value Date   HGBA1C 7.4* 09/21/2011

## 2012-09-12 NOTE — Assessment & Plan Note (Signed)
L elbow injury with subsquent pain summer 2011 - ?thalamic source of pain s/p tx for RSD by dr. Ethelene Hal - s/p injections, ablation and chronic narcotic tx>unimproved reviewed potential med problems such as gastroparesis and memory problems with chronic high dose narcotic use and encouraged use of fewer meds if pain will allow -  Changed from Ramos to [place] fall 2012 - current med regimen reviewed The current medical regimen is effective;  continue present plan and medications.

## 2012-09-18 ENCOUNTER — Telehealth: Payer: Self-pay | Admitting: Internal Medicine

## 2012-09-18 NOTE — Telephone Encounter (Signed)
Called pt back she stated she started byetta BID. Blood sugars are running 175. Requesting md advisement on increasing byetta...Raechel Chute

## 2012-09-18 NOTE — Telephone Encounter (Signed)
Patient needs to speak with Valentina Gu for clarification on her exenatide, she thought she was supposed to increase her dosage

## 2012-09-19 MED ORDER — GLIPIZIDE 5 MG PO TABS: 5.0000 mg | ORAL_TABLET | Freq: Every morning | ORAL | Status: DC

## 2012-09-19 NOTE — Telephone Encounter (Signed)
Pt wanted MD to be aware that BS is 307 this AM. Requesting advisement regarding Byetta.

## 2012-09-19 NOTE — Telephone Encounter (Signed)
Pt informed of new rx for Glipizide and MD's advisement.

## 2012-09-19 NOTE — Telephone Encounter (Signed)
Start low dose glipizide each AM in addition to ongoing meds (Byetta and metformin) -erx done

## 2012-09-20 ENCOUNTER — Encounter: Payer: Self-pay | Admitting: Internal Medicine

## 2012-09-20 NOTE — Telephone Encounter (Signed)
Mailing pt copy of lab results...Amanda Vasquez

## 2012-09-22 LAB — HM DIABETES EYE EXAM

## 2012-12-24 ENCOUNTER — Ambulatory Visit: Payer: 59 | Admitting: Internal Medicine

## 2012-12-25 ENCOUNTER — Ambulatory Visit: Payer: 59 | Admitting: Internal Medicine

## 2012-12-26 ENCOUNTER — Ambulatory Visit: Payer: 59 | Admitting: Internal Medicine

## 2013-01-01 ENCOUNTER — Other Ambulatory Visit: Payer: Self-pay | Admitting: Internal Medicine

## 2013-01-07 ENCOUNTER — Telehealth: Payer: Self-pay | Admitting: Internal Medicine

## 2013-01-07 MED ORDER — VALACYCLOVIR HCL 1 G PO TABS: ORAL_TABLET | ORAL | Status: DC

## 2013-01-07 NOTE — Telephone Encounter (Signed)
Patient went to get a refill on her valtrex and because it has been so long since she got a refill they pharmacy needs a new script, pt uses [facility]

## 2013-01-07 NOTE — Telephone Encounter (Signed)
Called pt no answer LMOM [facility].../lmb 

## 2013-01-16 ENCOUNTER — Other Ambulatory Visit (INDEPENDENT_AMBULATORY_CARE_PROVIDER_SITE_OTHER): Payer: BC Managed Care – PPO

## 2013-01-16 ENCOUNTER — Ambulatory Visit (INDEPENDENT_AMBULATORY_CARE_PROVIDER_SITE_OTHER): Payer: BC Managed Care – PPO | Admitting: Internal Medicine

## 2013-01-16 ENCOUNTER — Encounter: Payer: Self-pay | Admitting: Internal Medicine

## 2013-01-16 ENCOUNTER — Other Ambulatory Visit: Payer: Self-pay | Admitting: Internal Medicine

## 2013-01-16 VITALS — BP 120/62 | HR 102 | Temp 98.3°F | Wt 232.4 lb

## 2013-01-16 DIAGNOSIS — E669 Obesity, unspecified: Secondary | ICD-10-CM

## 2013-01-16 DIAGNOSIS — E119 Type 2 diabetes mellitus without complications: Secondary | ICD-10-CM

## 2013-01-16 LAB — HEMOGLOBIN A1C: Hgb A1c MFr Bld: 8.7 % — ABNORMAL HIGH (ref 4.6–6.5)

## 2013-01-16 MED ORDER — LIRAGLUTIDE 18 MG/3ML ~~LOC~~ SOPN: 0.3000 mL | PEN_INJECTOR | Freq: Every day | SUBCUTANEOUS | Status: DC

## 2013-01-16 NOTE — Addendum Note (Signed)
Addended by: Rene Paci A on: 01/16/2013 02:06 PM   Modules accepted: Orders

## 2013-01-16 NOTE — Patient Instructions (Signed)
It was good to see you today. Medications reviewed and updated, no changes recommended at this time. Test(s) ordered today. Your results will be released to MyChart (or called to you) after review, usually within 72hours after test completion. If any changes need to be made, you will be notified at that same time. If a1c>7, will consider resuming Victoza as discussed Please schedule followup in 3-4 months for a1c and weight check, call sooner if problems.

## 2013-01-16 NOTE — Assessment & Plan Note (Signed)
fasting cbgs 150-160 reviewed today Stopped onglyza 02/2011 as ineffective control 02/2011 started victoza and continued metformin Changed victoza to Byetta BID 08/2012 per formulary, but stopped due to weight gain and edema Resume victoza if a1c >7 Check a1c now and every 3 months, titrate meds as needed Recheck micro alb/cr annually and consider ARB/ACEI if needed  Lab Results  Component Value Date   HGBA1C 7.2* 09/12/2012

## 2013-01-16 NOTE — Assessment & Plan Note (Signed)
Weight trend reviewed - >20# weight gain on Byetta Now off same Wt Readings from Last 3 Encounters:  01/16/13 232 lb 6.4 oz (105.416 kg)  09/12/12 220 lb (99.791 kg)  09/21/11 216 lb (97.977 kg)

## 2013-01-16 NOTE — Progress Notes (Signed)
  Subjective:    Patient ID: Amanda Vasquez, female    DOB: 02-05-1971, 42 y.o.   MRN: [id number]  HPI  today for follow up -  reviewed chronic medical issues interval medical events  Past Medical History  Diagnosis Date  . DIABETES MELLITUS, TYPE II   . ALLERGIC RHINITIS   . ANXIETY   . PARESTHESIA   . OBESITY   . AMENORRHEA   . DISORDER, BIPOLAR, ATYPICAL MANIC   . DYSLIPIDEMIA   . Thalamic pain syndrome (hyperesthetic)     follows with Dr Vear Clock for same, on disability since 09/2011 for same  . Migraine     Review of Systems  Constitutional: Positive for unexpected weight change.  Respiratory: Negative for cough and shortness of breath.   Cardiovascular: Negative for chest pain and palpitations.  Neurological: Negative for dizziness and headaches.      Objective:   Physical Exam  BP 120/62  Pulse 102  Temp(Src) 98.3 F (36.8 C) (Oral)  Wt 232 lb 6.4 oz (105.416 kg)  BMI 38.67 kg/m2  SpO2 97% Wt Readings from Last 3 Encounters:  01/16/13 232 lb 6.4 oz (105.416 kg)  09/12/12 220 lb (99.791 kg)  09/21/11 216 lb (97.977 kg)   Constitutional: She is overweight, but appears well-developed and well-nourished. No distress.  Eyes: Conjunctivae and EOM are normal. Pupils are equal, round, and reactive to light. No scleral icterus.  Neck: Normal range of motion. Neck supple. No JVD present. No thyromegaly present.  Cardiovascular: Normal rate, regular rhythm and normal heart sounds.  No murmur heard. No BLE edema. Pulmonary/Chest: Effort normal and breath sounds normal. No respiratory distress. She has no wheezes.  Psychiatric: She has a normal mood and affect. Her behavior is normal. Judgment and thought content normal.    Lab Results  Component Value Date   WBC 9.0 09/12/2012   HGB 13.8 09/12/2012   HCT 41.6 09/12/2012   PLT 306.0 09/12/2012   CHOL 180 09/12/2012   TRIG 102.0 09/12/2012   HDL 78.40 09/12/2012   LDLDIRECT 138.8 06/22/2011   ALT 23 09/12/2012   AST 20  09/12/2012   NA 134* 09/12/2012   K 4.3 09/12/2012   CL 103 09/12/2012   CREATININE 0.6 09/12/2012   BUN 12 09/12/2012   CO2 22 09/12/2012   TSH 0.69 09/12/2012   HGBA1C 7.2* 09/12/2012   MICROALBUR 0.2 09/12/2012       Assessment & Plan:   see problem list. Medications and labs reviewed today.

## 2013-02-14 ENCOUNTER — Ambulatory Visit (HOSPITAL_COMMUNITY)
Admission: RE | Admit: 2013-02-14 | Discharge: 2013-02-14 | Disposition: A | Discharge status: Home / Self Care | Payer: BC Managed Care – PPO | Source: Ambulatory Visit | Attending: Anesthesiology | Admitting: Anesthesiology

## 2013-02-14 ENCOUNTER — Other Ambulatory Visit (HOSPITAL_COMMUNITY): Payer: Self-pay | Admitting: Anesthesiology

## 2013-02-14 DIAGNOSIS — M542 Cervicalgia: Secondary | ICD-10-CM

## 2013-02-14 DIAGNOSIS — M19019 Primary osteoarthritis, unspecified shoulder: Secondary | ICD-10-CM | POA: Insufficient documentation

## 2013-03-13 ENCOUNTER — Ambulatory Visit (INDEPENDENT_AMBULATORY_CARE_PROVIDER_SITE_OTHER): Payer: BC Managed Care – PPO | Admitting: Internal Medicine

## 2013-03-13 ENCOUNTER — Other Ambulatory Visit (INDEPENDENT_AMBULATORY_CARE_PROVIDER_SITE_OTHER): Payer: BC Managed Care – PPO

## 2013-03-13 ENCOUNTER — Encounter: Payer: Self-pay | Admitting: Internal Medicine

## 2013-03-13 VITALS — BP 120/72 | HR 102 | Temp 98.0°F | Wt 227.6 lb

## 2013-03-13 DIAGNOSIS — E119 Type 2 diabetes mellitus without complications: Secondary | ICD-10-CM

## 2013-03-13 DIAGNOSIS — R1032 Left lower quadrant pain: Secondary | ICD-10-CM

## 2013-03-13 DIAGNOSIS — K432 Incisional hernia without obstruction or gangrene: Secondary | ICD-10-CM

## 2013-03-13 LAB — HEMOGLOBIN A1C: Hgb A1c MFr Bld: 8.2 % — ABNORMAL HIGH (ref 4.6–6.5)

## 2013-03-13 MED ORDER — GABAPENTIN ENACARBIL ER 600 MG PO TBCR: 600.0000 mg | EXTENDED_RELEASE_TABLET | Freq: Every day | ORAL | Status: DC

## 2013-03-13 MED ORDER — VALACYCLOVIR HCL 1 G PO TABS: ORAL_TABLET | ORAL | Status: DC

## 2013-03-13 NOTE — Progress Notes (Signed)
  Subjective:    Patient ID: Amanda Vasquez, female    DOB: 01-Jun-1971, 43 y.o.   MRN: [id number]  HPI  today for follow up -  reviewed chronic medical issues and interval medical events  Past Medical History  Diagnosis Date  . DIABETES MELLITUS, TYPE II   . ALLERGIC RHINITIS   . ANXIETY   . PARESTHESIA   . OBESITY   . AMENORRHEA   . DISORDER, BIPOLAR, ATYPICAL MANIC   . DYSLIPIDEMIA   . Thalamic pain syndrome (hyperesthetic)     follows with Dr Vear Clock for same, on disability since 09/2011 for same  . Migraine     Review of Systems  Constitutional: Positive for fatigue and unexpected weight change. Negative for fever.  Respiratory: Negative for cough and shortness of breath.   Cardiovascular: Negative for chest pain and palpitations.  Gastrointestinal: Positive for abdominal pain (LLQ after coughing spell last week).  Neurological: Negative for dizziness and headaches.      Objective:   Physical Exam  BP 120/72  Pulse 102  Temp(Src) 98 F (36.7 C) (Oral)  Wt 227 lb 9.6 oz (103.239 kg)  BMI 37.87 kg/m2  SpO2 96% Wt Readings from Last 3 Encounters:  03/13/13 227 lb 9.6 oz (103.239 kg)  01/16/13 232 lb 6.4 oz (105.416 kg)  09/12/12 220 lb (99.791 kg)   Constitutional: She is overweight, but appears well-developed and well-nourished. No distress.  Eyes: Conjunctivae and EOM are normal. Pupils are equal, round, and reactive to light. No scleral icterus.  Neck: Normal range of motion. Neck supple. No JVD present. No thyromegaly present.  Cardiovascular: Normal rate, regular rhythm and normal heart sounds.  No murmur heard. No BLE edema. Pulmonary/Chest: Effort normal and breath sounds normal. No respiratory distress. She has no wheezes.  Abdomen: obese, soft, no rigidity, guarding or rebound. Small deficits over left lower cord drinks near umbilical line. Presence of possible abdominal wall muscular tear appreciated with reduced hernia, tender to palpation. +BS Psychiatric:  She has a normal mood and affect. Her behavior is normal. Judgment and thought content normal.    Lab Results  Component Value Date   WBC 9.0 09/12/2012   HGB 13.8 09/12/2012   HCT 41.6 09/12/2012   PLT 306.0 09/12/2012   CHOL 180 09/12/2012   TRIG 102.0 09/12/2012   HDL 78.40 09/12/2012   LDLDIRECT 138.8 06/22/2011   ALT 23 09/12/2012   AST 20 09/12/2012   NA 134* 09/12/2012   K 4.3 09/12/2012   CL 103 09/12/2012   CREATININE 0.6 09/12/2012   BUN 12 09/12/2012   CO2 22 09/12/2012   TSH 0.69 09/12/2012   HGBA1C 8.7* 01/16/2013   MICROALBUR 0.2 09/12/2012       Assessment & Plan:   Left lower quadrant pain, presence of small, reduced nonincarcerated abdominal wall hernia on exam - minimally tender to palpation - ?incisional hernia related to prior laparoscopic cholecystectomy - continue pain control as ongoing for chronic pain syndrome, no changes recommended, but will refer to general surgeon for evaluation of possible repair if persisting pain  Also see problem list. Medications and labs reviewed today.

## 2013-03-13 NOTE — Progress Notes (Signed)
Subjective:    Patient ID: Amanda Vasquez, female    DOB: 04-07-1971, 42 y.o.   MRN: [id number]  HPI Patient presents today for 6 month follow up for chronic medical conditions. Today she is well and has had no issues with current medications. Her only concern is periodic discomfort in LLQ of abdomen that has was aggravated recently after coughing. She has had pain in this area since her gallbladder was removed in 1998, but it has been worse than usual over the past 1-2 weeks since the coughing.  Past Medical History  Diagnosis Date  . DIABETES MELLITUS, TYPE II   . ALLERGIC RHINITIS   . ANXIETY   . PARESTHESIA   . OBESITY   . AMENORRHEA   . DISORDER, BIPOLAR, ATYPICAL MANIC   . DYSLIPIDEMIA   . Thalamic pain syndrome (hyperesthetic)     follows with Dr Vear Clock for same, on disability since 09/2011 for same  . Migraine    History  Substance Use Topics  . Smoking status: Former Smoker    Types: Cigarettes, Cigars    Quit date: 08/22/2008  . Smokeless tobacco: Not on file     Comment: Divorced, lives with boyfriend  . Alcohol Use: Yes     Comment: Rare    Review of Systems  Constitutional: Negative for unexpected weight change.  Respiratory: Negative for shortness of breath and wheezing.   Cardiovascular: Negative for chest pain and leg swelling.  Gastrointestinal: Positive for constipation.  Endocrine: Positive for cold intolerance. Negative for polydipsia and polyuria.  Skin: Negative for rash.  Neurological: Negative for headaches.        Objective:   Physical Exam  Constitutional: She is oriented to person, place, and time. She appears well-developed and well-nourished.  Pleasant, obese female  Neck: Normal range of motion. Neck supple. No tracheal deviation present. No thyromegaly present.  Cardiovascular: Normal rate and regular rhythm.  Exam reveals no friction rub.   No murmur heard. Pulmonary/Chest: Breath sounds normal. She has no wheezes. She has no rales.   Abdominal: She exhibits no distension.  Defect of abdominal wall palpated in LLQ with reducible contents. Tenderness noted upon palpation of LLQ.  Musculoskeletal: She exhibits no edema.  Increased sensitivity of LLE and LUE; consistent with existing thalamic pain syndrome  Neurological: She is alert and oriented to person, place, and time.  Skin: Skin is warm and dry.  Psychiatric: She has a normal mood and affect.    Lab Results  Component Value Date   WBC 9.0 09/12/2012   HGB 13.8 09/12/2012   HCT 41.6 09/12/2012   PLT 306.0 09/12/2012   GLUCOSE 148* 09/12/2012   CHOL 180 09/12/2012   TRIG 102.0 09/12/2012   HDL 78.40 09/12/2012   LDLDIRECT 138.8 06/22/2011   LDLCALC 81 09/12/2012   ALT 23 09/12/2012   AST 20 09/12/2012   NA 134* 09/12/2012   K 4.3 09/12/2012   CL 103 09/12/2012   CREATININE 0.6 09/12/2012   BUN 12 09/12/2012   CO2 22 09/12/2012   TSH 0.69 09/12/2012   HGBA1C 8.7* 01/16/2013   MICROALBUR 0.2 09/12/2012        Assessment & Plan:  1. Diabetes Mellitus II- Hbg A1C from 01/16/13 was 8.7; her medication was recently changed back to Victoza, so a current A1C will be obtained to determine control considering the recent change.  2. Incisional hernia- Refer to general surgery for evaluation, given the patient's recent discomfort. She is instructed to avoid heavy lifting  and abdominal exercise over the next few weeks. She is instructed that she may call and cancel if discomfort resolves.   Concha Se, [place] Neighbor  I have personally reviewed this case with PA student. I also personally examined this patient. I agree with history and findings as documented above. I reviewed, discussed and approve of the assessment and plan as listed above. Rene Paci, MD

## 2013-03-13 NOTE — Patient Instructions (Signed)
It was good to see you today. Medications reviewed and updated Test(s) ordered today. Your results will be released to MyChart (or called to you) after review, usually within 72hours after test completion. If any changes need to be made, you will be notified at that same time. Will refer to general surgeon to evaluate hernia and associated pain. Please reschedule followup in 3-4 months for a1c and weight check, call sooner if problems.

## 2013-03-13 NOTE — Assessment & Plan Note (Signed)
fasting cbgs 150-160 reviewed today Stopped onglyza 02/2011 as ineffective control 02/2011 started victoza and continued metformin Changed victoza to Byetta BID 08/2012 per formulary, but stopped due to weight gain and edema Resumed victoza 12/2012 due to a1c >7 Check a1c now and every 3 months, titrate meds as needed  Lab Results  Component Value Date   HGBA1C 8.7* 01/16/2013

## 2013-03-28 ENCOUNTER — Ambulatory Visit (INDEPENDENT_AMBULATORY_CARE_PROVIDER_SITE_OTHER): Payer: BC Managed Care – PPO | Admitting: General Surgery

## 2013-04-09 ENCOUNTER — Encounter: Payer: Self-pay | Admitting: Internal Medicine

## 2013-04-09 ENCOUNTER — Other Ambulatory Visit (INDEPENDENT_AMBULATORY_CARE_PROVIDER_SITE_OTHER): Payer: BC Managed Care – PPO

## 2013-04-09 ENCOUNTER — Ambulatory Visit (INDEPENDENT_AMBULATORY_CARE_PROVIDER_SITE_OTHER): Payer: BC Managed Care – PPO | Admitting: Internal Medicine

## 2013-04-09 VITALS — BP 118/82 | HR 102 | Temp 99.5°F | Wt 224.4 lb

## 2013-04-09 DIAGNOSIS — E119 Type 2 diabetes mellitus without complications: Secondary | ICD-10-CM

## 2013-04-09 DIAGNOSIS — K439 Ventral hernia without obstruction or gangrene: Secondary | ICD-10-CM

## 2013-04-09 DIAGNOSIS — G890 Central pain syndrome: Secondary | ICD-10-CM

## 2013-04-09 DIAGNOSIS — R1013 Epigastric pain: Secondary | ICD-10-CM

## 2013-04-09 LAB — BASIC METABOLIC PANEL
BUN: 9 mg/dL (ref 6–23)
CO2: 27 mEq/L (ref 19–32)
Calcium: 9.4 mg/dL (ref 8.4–10.5)
Chloride: 105 mEq/L (ref 96–112)
Creatinine, Ser: 0.8 mg/dL (ref 0.4–1.2)
GFR: 86.92 mL/min (ref >60.00)
Glucose, Bld: 198 mg/dL — ABNORMAL HIGH (ref 70–99)
Potassium: 4.8 mEq/L (ref 3.5–5.1)
Sodium: 135 mEq/L (ref 135–145)

## 2013-04-09 NOTE — Assessment & Plan Note (Signed)
L elbow injury with subsquent pain summer 2011 - ?thalamic source of pain s/p tx for RSD by dr. Ethelene Hal - s/p injections, ablation and chronic narcotic tx>unimproved reviewed potential med problems such as gastroparesis and memory problems with chronic high dose narcotic use and encouraged use of fewer meds if pain will allow -  Changed from [place] to [place] fall 2012 - current med regimen reviewed The current medical regimen is effective;  continue present plan and medications.

## 2013-04-09 NOTE — Assessment & Plan Note (Signed)
fasting cbgs 150-160 reviewed today Stopped onglyza 02/2011 as ineffective control 02/2011 started victoza and continued metformin briefly on Byetta 08/2012 per formulary, but stopped due to weight gain and edema Resumed victoza 12/2012 due to a1c >7  Lab Results  Component Value Date   HGBA1C 8.2* 03/13/2013

## 2013-04-09 NOTE — Progress Notes (Signed)
Subjective:    Patient ID: Amanda Vasquez, female    DOB: 27-Feb-1971, 42 y.o.   MRN: [id number]  HPI   Patient presents today for follow up of ER visit at [facility] for severe abdominal pain 1 week ago. This began after an episode of N/V, which she states is normal for her with Victoza. She states the N/V led to enlargement of previously diagnosed incisional/umbilical hernia. She was kept overnight in the ED; her labs and CT were reviewed today- labs were normal and CT showed small, non-incarcerated umbilical hernia. She continues to have mild-moderate pain in the area of the hernia.She is already scheduled for surgical evaluation on Sept 4th.   Past Medical History  Diagnosis Date  . DIABETES MELLITUS, TYPE II   . ALLERGIC RHINITIS   . ANXIETY   . PARESTHESIA   . OBESITY   . AMENORRHEA   . DISORDER, BIPOLAR, ATYPICAL MANIC   . DYSLIPIDEMIA   . Thalamic pain syndrome (hyperesthetic)     follows with Dr Vear Clock for same, on disability since 09/2011 for same  . Migraine    Family History  Problem Relation Age of Onset  . Adopted: Yes   History  Substance Use Topics  . Smoking status: Former Smoker    Types: Cigarettes, Cigars    Quit date: 08/22/2008  . Smokeless tobacco: Not on file     Comment: Divorced, lives with boyfriend  . Alcohol Use: Yes     Comment: Rare    Review of Systems  Constitutional: Negative for fever and unexpected weight change.  Respiratory: Negative for cough and shortness of breath.   Cardiovascular: Negative for chest pain.  Gastrointestinal: Positive for constipation.  Genitourinary: Negative for dysuria.  Skin: Negative for rash.  Neurological: Negative for dizziness and headaches.       Objective:   Physical Exam  Constitutional: She is oriented to person, place, and time. She appears well-developed and well-nourished.  Obese female  HENT:  Head: Normocephalic and atraumatic.  Eyes: Conjunctivae and EOM are normal. Pupils are equal,  round, and reactive to light.  Neck: Normal range of motion. Neck supple.  Cardiovascular: Normal rate and regular rhythm.   Pulmonary/Chest: Breath sounds normal. She has no wheezes. She has no rales.  Abdominal: Soft. Bowel sounds are normal. There is tenderness.  Presence of  umbilical hernia, reducible and non-incarcerated Tenderness to palpation of umbilical and epigastric region  Neurological: She is alert and oriented to person, place, and time. No cranial nerve deficit.  Skin: Skin is warm and dry. No rash noted.  Psychiatric: She has a normal mood and affect.    Lab Results  Component Value Date   WBC 9.0 09/12/2012   HGB 13.8 09/12/2012   HCT 41.6 09/12/2012   PLT 306.0 09/12/2012   GLUCOSE 148* 09/12/2012   CHOL 180 09/12/2012   TRIG 102.0 09/12/2012   HDL 78.40 09/12/2012   LDLDIRECT 138.8 06/22/2011   LDLCALC 81 09/12/2012   ALT 23 09/12/2012   AST 20 09/12/2012   NA 134* 09/12/2012   K 4.3 09/12/2012   CL 103 09/12/2012   CREATININE 0.6 09/12/2012   BUN 12 09/12/2012   CO2 22 09/12/2012   TSH 0.69 09/12/2012   HGBA1C 8.2* 03/13/2013   MICROALBUR 0.2 09/12/2012        Assessment & Plan:  1. Abdominal pain- Possibly related to exacerbation umbilical hernia with aggravation of pain from central pain syndrome and DM. Patient should continue with surgical  consult on Sept 4th. She is advised to eat small, bland meals and avoid straining her abdominal muscles until she is seen.  RTC if worsening symptoms in the meantime.  2. DM II- [place] requests follow up creatinine measurement due to contrast use for CT before continuation of metformin.  Concha Se, [place] [facility]ighbor  I have personally reviewed this case with PA student. I also personally examined this patient. I agree with history and findings as documented above. I reviewed, discussed and approve of the assessment and plan as listed above. Rene Paci, MD

## 2013-04-09 NOTE — Patient Instructions (Signed)
It was good to see you today. We have reviewed your prior records including labs and tests today Test(s) ordered today. Your results will be released to MyChart (or called to you) after review, usually within 72hours after test completion. If any changes need to be made, you will be notified at that same time. Medications reviewed and updated, no changes recommended at this time. Keep appointment with Dr Biagio Quint as scheduled 04/25/13 to discuss possible repair of your hernia because of abdominal pain

## 2013-04-25 ENCOUNTER — Ambulatory Visit (INDEPENDENT_AMBULATORY_CARE_PROVIDER_SITE_OTHER): Payer: BC Managed Care – PPO | Admitting: General Surgery

## 2013-04-25 ENCOUNTER — Encounter (INDEPENDENT_AMBULATORY_CARE_PROVIDER_SITE_OTHER): Payer: Self-pay | Admitting: General Surgery

## 2013-04-25 VITALS — BP 118/61 | HR 62 | Temp 98.6°F | Resp 16 | Ht 64.0 in | Wt <= 1120 oz

## 2013-04-25 DIAGNOSIS — M6208 Separation of muscle (nontraumatic), other site: Secondary | ICD-10-CM

## 2013-04-25 DIAGNOSIS — K429 Umbilical hernia without obstruction or gangrene: Secondary | ICD-10-CM

## 2013-04-25 DIAGNOSIS — M6200 Separation of muscle (nontraumatic), unspecified site: Secondary | ICD-10-CM

## 2013-04-25 NOTE — Progress Notes (Signed)
Patient ID: Amanda Vasquez, female   DOB: 1971/02/03, 42 y.o.   MRN: [id number]  Chief Complaint  Patient presents with  . Hernia    HPI Amanda Vasquez is a 42 y.o. female.  She is referred by Dr. Felicity Coyer for evaluation of umbilical and ventral hernia.  She said that she has chronic nausea which she says is due to her Victoza.  She also has a known umbilical hernia which he says has been present since her gallbladder surgery in the 1990s. She says that she hasn't really ever considered doing any surgery to fix her hernia because it has been reducible. She does have chronic pain syndrome and diffuse abdominal pains as well as a history of chronic constipation and chronic nausea. About 3 weeks ago she had a spell of vomiting and she had more severe periumbilical pain abdominal pain which led her to the emergency room in [place].  She had a CT scan of the abdomen which confirmed a fat-containing umbilical hernia and she was discharged home she says that her periumbilical pain has improved but she still has bilateral abdominal pain. She also has a midline bulge that extends up to her ribs when sitting up. HPI  Past Medical History  Diagnosis Date  . DIABETES MELLITUS, TYPE II   . ALLERGIC RHINITIS   . ANXIETY   . PARESTHESIA   . OBESITY   . AMENORRHEA   . DISORDER, BIPOLAR, ATYPICAL MANIC   . DYSLIPIDEMIA   . Thalamic pain syndrome (hyperesthetic)     follows with Dr Vear Clock for same, on disability since 09/2011 for same  . Migraine     Past Surgical History  Procedure Laterality Date  . Breast surgery  1997    Reduction  . Cholecystectomy  1998  . Intrauterine device insertion  2009    Family History  Problem Relation Age of Onset  . Adopted: Yes    Social History History  Substance Use Topics  . Smoking status: Former Smoker    Types: Cigarettes, Cigars    Quit date: 08/22/2008  . Smokeless tobacco: Not on file     Comment: Divorced, lives with boyfriend  . Alcohol Use: Yes      Comment: Rare    Allergies  Allergen Reactions  . Latex   . Morphine   . Prednisone     Current Outpatient Prescriptions  Medication Sig Dispense Refill  . ALPRAZolam (XANAX) 0.5 MG tablet Take 0.5 mg by mouth 3 (three) times daily.       [name] ALPRAZolam (XANAX) 0.5 MG tablet 0.5 mg. Take 0.5 mg by mouth 3 (three) times daily as needed for Sleep.      [name] atorvastatin (LIPITOR) 10 MG tablet Take 1 tablet (10 mg total) by mouth daily.  90 tablet  3  . atorvastatin (LIPITOR) 10 MG tablet 10 mg. Take 10 mg by mouth nightly.      [name] b complex vitamins tablet Take 1 tablet by mouth daily.        [name] buPROPion (BUDEPRION XL) 300 MG 24 hr tablet 300 mg. Take 300 mg by mouth daily.      [name] buPROPion (WELLBUTRIN XL) 300 MG 24 hr tablet       . Cetirizine HCl 10 MG CAPS Take by mouth.      . Cholecalciferol (VITAMIN D3) 2000 UNITS TABS Take by mouth daily.        [name] docusate sodium (COLACE) 100 MG capsule Take 100 mg by mouth 2 (  two) times daily as needed.       . Gabapentin Enacarbil 600 MG TB24 Take 600 mg by mouth at bedtime.  30 tablet    . Gabapentin, PHN, (GRALISE) 600 MG TABS 1,800 mg. Take 1,800 mg by mouth nightly.      [name] glipiZIDE (GLUCOTROL) 5 MG tablet TAKE 1 TABLET BY MOUTH EVERY MORNING  30 tablet  11  . glipiZIDE (GLUCOTROL) 5 MG tablet 5 mg. Take 5 mg by mouth daily.      [name] HYDROmorphone HCl (EXALGO) 12 MG T24A SR tablet 24 mg. Take 24 mg by mouth daily.      [name] HYDROmorphone HCl (EXALGO) 12 MG TB24 Take 24 mg by mouth at bedtime.      . Liraglutide (VICTOZA) 18 MG/3ML SOPN Inject 0.3 mLs into the skin daily.  9 mL  3  . Liraglutide (VICTOZA) 18 MG/3ML SOPN 1.2 mg. Inject 1.2 mg into the skin daily.      . magnesium oxide (MAG-OX) 400 MG tablet Take 400 mg by mouth daily.      . metFORMIN (GLUCOPHAGE) 1000 MG tablet Take 1 tablet (1,000 mg total) by mouth 2 (two) times daily with a meal.  180 tablet  3  . metFORMIN (GLUCOPHAGE) 1000 MG tablet 1,000 mg. Take 1,000 mg by mouth 2 (two)  times daily with meals.      . Multiple Vitamin (MULTIVITAMIN) tablet Take 1 tablet by mouth daily.        . ondansetron (ZOFRAN) 4 MG tablet Take as needed for nausea      . oxyCODONE (OXY IR/ROXICODONE) 5 MG immediate release tablet 5 mg. Take 5 mg by mouth every 4 (four) hours as needed for Pain.      . Oxycodone HCl 10 MG TABS Take 5 mg by mouth 4 (four) times daily. Take 1-2 tabs four times a day  90 tablet    . traZODone (DESYREL) 100 MG tablet 100 mg. Take 100 mg by mouth nightly.      . valACYclovir (VALTREX) 1000 MG tablet TAKE 1 TABLET EVERY DAY AS NEEDED  12 tablet  0  . vitamin C (ASCORBIC ACID) 500 MG tablet Take 500 mg by mouth as needed.        No current facility-administered medications for this visit.    Review of Systems Review of Systems All other review of systems negative or noncontributory except as stated in the HPI  Blood pressure 118/61, pulse 62, temperature 98.6 F (37 C), temperature source Temporal, resp. rate 16, height 5\' 4"  (1.626 m), weight 27 lb 6.4 oz (12.429 kg).  Physical Exam Physical Exam Physical Exam  Nursing note and vitals reviewed. Constitutional: She is oriented to person, place, and time. She appears well-developed and well-nourished. No distress.  HENT:  Head: Normocephalic and atraumatic.  Mouth/Throat: No oropharyngeal exudate.  Eyes: Conjunctivae and EOM are normal. Pupils are equal, round, and reactive to light. Right eye exhibits no discharge. Left eye exhibits no discharge. No scleral icterus.  Neck: Normal range of motion. Neck supple. No tracheal deviation present.  Cardiovascular: Normal rate, regular rhythm, normal heart sounds and intact distal pulses.   Pulmonary/Chest: Effort normal and breath sounds normal. No stridor. No respiratory distress. She has no wheezes.  Abdominal: Soft. Bowel sounds are normal. She exhibits no distension and no mass. There is no tenderness. There is no rebound and no guarding. She has a midline  diastasis recti with valsalva as well as small reducible umbilical  hernia.   Musculoskeletal: Normal range of motion. She exhibits no edema and no tenderness.  Neurological: She is alert and oriented to person, place, and time.  Skin: Skin is warm and dry. No rash noted. She is not diaphoretic. No erythema. No pallor.  Psychiatric: She has a normal mood and affect. Her behavior is normal. Judgment and thought content normal.    Data Reviewed CT report, outside records  Assessment    Umbilical hernia and diastasis recti She does have a reducible umbilical hernia on exam and on CT as well as a midline diastases recti. We had a long discussion about rectus diastases in there should be no harm in this. We also discussed her umbilical hernia and the options of continued watchful waiting versus surgical repair with the pros and cons of each option. We discussed the possibility of intestinal incarceration or strangulation and she expressed understanding of this. I did offer her surgical repair but she is also considering weight loss surgery and she would like to consider this possibility as well.  She would like to think about her options and let me know later what she would like to do. Issue was to undergo the workup for weight loss surgery then we could potentially repair her hernia at the same time. She is going to think about her options and get back with me in the glenohumeral like to schedule repair or continue with watchful waiting or get started on the workup for weight loss surgery.    Plan    I have requested her actual CT images from [facility] and I would like to review these. She will also think about her options and she will let me know if she is interested in umbilical hernia repair or even possible weight loss surgery.        Lodema Pilot DAVID 04/25/2013, 11:45 AM

## 2013-04-26 ENCOUNTER — Encounter: Payer: Self-pay | Admitting: Internal Medicine

## 2013-04-26 ENCOUNTER — Ambulatory Visit (INDEPENDENT_AMBULATORY_CARE_PROVIDER_SITE_OTHER): Payer: BC Managed Care – PPO | Admitting: Internal Medicine

## 2013-04-26 VITALS — BP 120/62 | HR 115 | Temp 98.5°F | Wt 227.8 lb

## 2013-04-26 DIAGNOSIS — E669 Obesity, unspecified: Secondary | ICD-10-CM

## 2013-04-26 DIAGNOSIS — Z23 Encounter for immunization: Secondary | ICD-10-CM

## 2013-04-26 MED ORDER — PHENTERMINE HCL 37.5 MG PO CAPS: 37.5000 mg | ORAL_CAPSULE | ORAL | Status: DC

## 2013-04-26 NOTE — Patient Instructions (Signed)
It was good to see you today. We have reviewed your prior records including labs and tests today Okay to use phentermine for a short period of time to help you reach your weight loss goals -prescription given to you today to take your pharmacy. Office visit will be required to demonstrate weight loss before any refills will be provided Work on lifestyle changes as discussed (low fat, low carb, increased protein diet; improved exercise efforts; weight loss) to control sugar, blood pressure and cholesterol levels and/or reduce risk of developing other medical problems. Look into [email or web address] or other type of food journal to assist you in this process. Ready Wheat Belly Please schedule followup in 6-8 weeks, call sooner if problems.

## 2013-04-26 NOTE — Progress Notes (Signed)
  Subjective:    Patient ID: Amanda Vasquez, female    DOB: 1970-10-26, 42 y.o.   MRN: [id number]  HPI   Here for follow up to discuss weight loss options  Past Medical History  Diagnosis Date  . DIABETES MELLITUS, TYPE II   . ALLERGIC RHINITIS   . ANXIETY   . PARESTHESIA   . OBESITY   . AMENORRHEA   . DISORDER, BIPOLAR, ATYPICAL MANIC   . DYSLIPIDEMIA   . Thalamic pain syndrome (hyperesthetic)     follows with Dr Vear Clock for same, on disability since 09/2011 for same  . Migraine    Review of Systems  Constitutional: Negative for fever and unexpected weight change.  Respiratory: Negative for cough and shortness of breath.   Cardiovascular: Negative for chest pain.  Gastrointestinal: Positive for constipation.  Genitourinary: Negative for dysuria.  Skin: Negative for rash.  Neurological: Negative for dizziness and headaches.       Objective:   Physical Exam BP 120/62  Pulse 115  Temp(Src) 98.5 F (36.9 C) (Oral)  Wt 227 lb 12.8 oz (103.329 kg)  BMI 39.08 kg/m2  SpO2 97% Wt Readings from Last 3 Encounters:  04/26/13 227 lb 12.8 oz (103.329 kg)  04/25/13 27 lb 6.4 oz (12.429 kg)  04/09/13 224 lb 6.4 oz (101.787 kg)   Constitutional: She is obese, but appears well-developed and well-nourished. No distress.  Neck: Normal range of motion. Neck supple. No JVD present. No thyromegaly present.  Cardiovascular: Normal rate, regular rhythm and normal heart sounds.  No murmur heard. No BLE edema. Pulmonary/Chest: Effort normal and breath sounds normal. No respiratory distress. She has no wheezes.  Psychiatric: She has a normal mood and affect. Her behavior is normal. Judgment and thought content normal.     Lab Results  Component Value Date   WBC 9.0 09/12/2012   HGB 13.8 09/12/2012   HCT 41.6 09/12/2012   PLT 306.0 09/12/2012   GLUCOSE 198* 04/09/2013   CHOL 180 09/12/2012   TRIG 102.0 09/12/2012   HDL 78.40 09/12/2012   LDLDIRECT 138.8 06/22/2011   LDLCALC 81 09/12/2012   ALT 23 09/12/2012   AST 20 09/12/2012   NA 135 04/09/2013   K 4.8 04/09/2013   CL 105 04/09/2013   CREATININE 0.8 04/09/2013   BUN 9 04/09/2013   CO2 27 04/09/2013   TSH 0.69 09/12/2012   HGBA1C 8.2* 03/13/2013   MICROALBUR 0.2 09/12/2012        Assessment & Plan:   See problem list. Medications and labs reviewed today.  Time spent with pt today 25 minutes, greater than 50% time spent counseling patient on obesity and medication review. Also review of prior records

## 2013-04-26 NOTE — Assessment & Plan Note (Signed)
Weight trend reviewed -  ?consider potential bariatric intervention as suggested by general surgeon at office visit September 2014 First focus on diet, exercise and lifestyle modifications  Will use short term Phentermine to help reach weight loss goals - Has used same successfully in past for temporary loss we reviewed potential risk/benefit and possible side effects - pt understands and agrees to same  1st rx of maximum 3-6 months prescription provided today Patient also to investigate myfitnesspal and wheat belly followup 6 weeks to review food Journal, weight and medications, patient agrees to call sooner if side effects or problems  Wt Readings from Last 3 Encounters:  04/26/13 227 lb 12.8 oz (103.329 kg)  04/25/13 27 lb 6.4 oz (12.429 kg)  04/09/13 224 lb 6.4 oz (101.787 kg)

## 2013-05-03 ENCOUNTER — Telehealth (INDEPENDENT_AMBULATORY_CARE_PROVIDER_SITE_OTHER): Payer: Self-pay

## 2013-05-03 NOTE — Telephone Encounter (Signed)
Medical records request for patient faxed to [phone]

## 2013-05-20 ENCOUNTER — Other Ambulatory Visit: Payer: Self-pay | Admitting: Internal Medicine

## 2013-05-23 ENCOUNTER — Telehealth: Payer: Self-pay | Admitting: Internal Medicine

## 2013-05-23 MED ORDER — PHENTERMINE HCL 37.5 MG PO CAPS: 37.5000 mg | ORAL_CAPSULE | ORAL | Status: DC

## 2013-05-23 NOTE — Telephone Encounter (Signed)
Pt sent email requesting refill refill on her phentermine. Is this ok...lmb

## 2013-05-23 NOTE — Telephone Encounter (Signed)
Called pt no answer [facility] rx ready for pick-up.../lmb 

## 2013-05-23 NOTE — Telephone Encounter (Signed)
Done hardcopy to robin  

## 2013-06-07 ENCOUNTER — Ambulatory Visit: Payer: BC Managed Care – PPO | Admitting: Internal Medicine

## 2013-06-12 ENCOUNTER — Ambulatory Visit: Payer: BC Managed Care – PPO | Admitting: Internal Medicine

## 2013-06-19 ENCOUNTER — Ambulatory Visit (INDEPENDENT_AMBULATORY_CARE_PROVIDER_SITE_OTHER): Payer: BC Managed Care – PPO | Admitting: Internal Medicine

## 2013-06-19 ENCOUNTER — Encounter: Payer: Self-pay | Admitting: Internal Medicine

## 2013-06-19 ENCOUNTER — Other Ambulatory Visit (INDEPENDENT_AMBULATORY_CARE_PROVIDER_SITE_OTHER): Payer: BC Managed Care – PPO

## 2013-06-19 VITALS — BP 110/62 | HR 117 | Temp 98.3°F | Ht 64.0 in | Wt 206.5 lb

## 2013-06-19 DIAGNOSIS — Z1239 Encounter for other screening for malignant neoplasm of breast: Secondary | ICD-10-CM

## 2013-06-19 DIAGNOSIS — E785 Hyperlipidemia, unspecified: Secondary | ICD-10-CM

## 2013-06-19 DIAGNOSIS — E119 Type 2 diabetes mellitus without complications: Secondary | ICD-10-CM

## 2013-06-19 DIAGNOSIS — E669 Obesity, unspecified: Secondary | ICD-10-CM

## 2013-06-19 LAB — LIPID PANEL
Cholesterol: 122 mg/dL (ref 0–200)
HDL: 53.9 mg/dL (ref >39.00)
LDL Cholesterol: 48 mg/dL (ref 0–99)
Total CHOL/HDL Ratio: 2
Triglycerides: 100 mg/dL (ref 0.0–149.0)
VLDL: 20 mg/dL (ref 0.0–40.0)

## 2013-06-19 LAB — HEMOGLOBIN A1C: Hgb A1c MFr Bld: 6.2 % (ref 4.6–6.5)

## 2013-06-19 MED ORDER — "NEEDLE (DISP) 25G X 1-1/2"" MISC": Status: DC

## 2013-06-19 MED ORDER — GLUCOSE BLOOD VI STRP: ORAL_STRIP | Status: DC

## 2013-06-19 MED ORDER — PHENTERMINE HCL 37.5 MG PO CAPS: 37.5000 mg | ORAL_CAPSULE | ORAL | Status: DC

## 2013-06-19 NOTE — Progress Notes (Signed)
  Subjective:    Patient ID: Amanda Vasquez, female    DOB: April 03, 1971, 42 y.o.   MRN: [id number]  Diabetes She presents for her follow-up diabetic visit. She has type 2 diabetes mellitus. Her disease course has been stable. Pertinent negatives for hypoglycemia include no dizziness, headaches or seizures. Associated symptoms include fatigue (chronic). Pertinent negatives for diabetes include no chest pain, no polydipsia, no polyuria and no visual change. There are no hypoglycemic complications. Symptoms are stable. There are no diabetic complications. She is compliant with treatment all of the time. She is following a diabetic and generally healthy diet.     Past Medical History  Diagnosis Date  . DIABETES MELLITUS, TYPE II   . ALLERGIC RHINITIS   . ANXIETY   . PARESTHESIA   . OBESITY   . AMENORRHEA   . DISORDER, BIPOLAR, ATYPICAL MANIC   . DYSLIPIDEMIA   . Thalamic pain syndrome (hyperesthetic)     follows with Dr Vear Clock for same, on disability since 09/2011 for same  . Migraine    Review of Systems  Constitutional: Positive for fatigue (chronic). Negative for fever and unexpected weight change.  Respiratory: Negative for cough and shortness of breath.   Cardiovascular: Negative for chest pain.  Gastrointestinal: Positive for constipation.  Endocrine: Negative for polydipsia and polyuria.  Genitourinary: Negative for dysuria.  Skin: Negative for rash.  Neurological: Negative for dizziness, seizures and headaches.       Objective:   Physical Exam BP 110/62  Pulse 117  Temp(Src) 98.3 F (36.8 C) (Oral)  Ht 5\' 4"  (1.626 m)  Wt 206 lb 8 oz (93.668 kg)  BMI 35.43 kg/m2  SpO2 98% Wt Readings from Last 3 Encounters:  06/19/13 206 lb 8 oz (93.668 kg)  04/26/13 227 lb 12.8 oz (103.329 kg)  04/25/13 27 lb 6.4 oz (12.429 kg)   Constitutional: She is overweight, but appears well-developed and well-nourished. No distress.  Neck: Normal range of motion. Neck supple. No JVD present.  No thyromegaly present.  Cardiovascular: Normal rate, regular rhythm and normal heart sounds.  No murmur heard. No BLE edema. Pulmonary/Chest: Effort normal and breath sounds normal. No respiratory distress. She has no wheezes.  Psychiatric: She has a normal mood and affect. Her behavior is normal. Judgment and thought content normal.    Lab Results  Component Value Date   WBC 9.0 09/12/2012   HGB 13.8 09/12/2012   HCT 41.6 09/12/2012   PLT 306.0 09/12/2012   GLUCOSE 198* 04/09/2013   CHOL 180 09/12/2012   TRIG 102.0 09/12/2012   HDL 78.40 09/12/2012   LDLDIRECT 138.8 06/22/2011   LDLCALC 81 09/12/2012   ALT 23 09/12/2012   AST 20 09/12/2012   NA 135 04/09/2013   K 4.8 04/09/2013   CL 105 04/09/2013   CREATININE 0.8 04/09/2013   BUN 9 04/09/2013   CO2 27 04/09/2013   TSH 0.69 09/12/2012   HGBA1C 8.2* 03/13/2013   MICROALBUR 0.2 09/12/2012        Assessment & Plan:   See problem list. Medications and labs reviewed today.  Time spent with pt today 25 minutes, greater than 50% time spent counseling patient on obesity, diabetes and medication review. Also review of prior records

## 2013-06-19 NOTE — Patient Instructions (Signed)
It was good to see you today.  We have reviewed your prior records including labs and tests today  Test(s) ordered today. Your results will be released to MyChart (or called to you) after review, usually within 72hours after test completion. If any changes need to be made, you will be notified at that same time.  Medications reviewed and updated, no changes recommended at this time.  Okay to continue to use phentermine for a short period of time to help you reach your weight loss goals -prescription given to you today to take your pharmacy.   Continue to work on lifestyle changes as discussed (low fat, low carb, increased protein diet; improved exercise efforts; weight loss) to control sugar, blood pressure and cholesterol levels and/or reduce risk of developing other medical problems. Look into [email or web address] or other type of food journal to assist you in this process.  Please schedule followup in 3 months, call sooner if problems.

## 2013-06-19 NOTE — Assessment & Plan Note (Signed)
fasting cbgs 150-160 reviewed today Stopped onglyza 02/2011 as ineffective control 02/2011 started victoza and continued metformin briefly on Byetta 08/2012 per formulary, but stopped due to weight gain and edema Resumed victoza 12/2012 due to a1c >7  Lab Results  Component Value Date   HGBA1C 8.2* 03/13/2013

## 2013-06-19 NOTE — Assessment & Plan Note (Signed)
Weight trend reviewed -  ?consider potential bariatric intervention as suggested by general surgeon at office visit September 2014 Begin with focus on diet, exercise and lifestyle modifications  Will use short term Phentermine to help reach weight loss goals - Has used same successfully in past for temporary loss Began 04/2013 with1st rx of maximum 3-6 months prescriptions Patient using myfitnesspal and fitbit and following wheat belly diet  Wt Readings from Last 3 Encounters:  06/19/13 206 lb 8 oz (93.668 kg)  04/26/13 227 lb 12.8 oz (103.329 kg)  04/25/13 27 lb 6.4 oz (12.429 kg)

## 2013-06-19 NOTE — Assessment & Plan Note (Signed)
started low dose atorva 08/2011 to get LDL<100 as optho saw microvasc retinal changes on eye exam fall 2012 Recheck lipids annually and adjust as needed The current medical regimen is effective;  continue present plan and medications.

## 2013-06-26 ENCOUNTER — Telehealth: Payer: Self-pay | Admitting: Internal Medicine

## 2013-06-26 DIAGNOSIS — M79669 Pain in unspecified lower leg: Secondary | ICD-10-CM

## 2013-06-26 NOTE — Telephone Encounter (Signed)
Pt request referral for Sport Medicine doctor due to problem with the calf muscle. Please advise.

## 2013-06-26 NOTE — Telephone Encounter (Signed)
No need for refer - please schedule with Dr Michiel Sites here Thanks!

## 2013-06-27 NOTE — Telephone Encounter (Signed)
Left detail massage to call back and schedule an appt with Dr. Katrinka Blazing.

## 2013-07-09 ENCOUNTER — Encounter: Payer: Self-pay | Admitting: Internal Medicine

## 2013-07-12 ENCOUNTER — Other Ambulatory Visit (INDEPENDENT_AMBULATORY_CARE_PROVIDER_SITE_OTHER): Payer: BC Managed Care – PPO

## 2013-07-12 ENCOUNTER — Ambulatory Visit (INDEPENDENT_AMBULATORY_CARE_PROVIDER_SITE_OTHER): Payer: BC Managed Care – PPO | Admitting: Family Medicine

## 2013-07-12 ENCOUNTER — Encounter: Payer: Self-pay | Admitting: Family Medicine

## 2013-07-12 VITALS — BP 112/76 | HR 103 | Wt 215.0 lb

## 2013-07-12 DIAGNOSIS — M722 Plantar fascial fibromatosis: Secondary | ICD-10-CM | POA: Insufficient documentation

## 2013-07-12 DIAGNOSIS — M79672 Pain in left foot: Secondary | ICD-10-CM

## 2013-07-12 DIAGNOSIS — M79609 Pain in unspecified limb: Secondary | ICD-10-CM

## 2013-07-12 MED ORDER — MELOXICAM 15 MG PO TABS: 15.0000 mg | ORAL_TABLET | Freq: Every day | ORAL | Status: DC

## 2013-07-12 NOTE — Assessment & Plan Note (Signed)
Plantar Fascitis: We reviewed that stretching is critically important to the treatment of PF. Reviewed footwear. Rigid soles have been shown to help with PF. Night splints can help. Reviewed rehab of stretching and calf raises.  Could benefit from a corticosteroid injection, orthotics, or other measures if conservative treatment fails. Discussed over-the-counter orthotics he can be beneficial. Patient will follow up in 3-4 weeks. If she continues to have trouble we will do orthotics likely.

## 2013-07-12 NOTE — Progress Notes (Signed)
I'm seeing this patient by the request  of:  Rene Paci, MD  CC: left heel pain.   HPI: Patient is a very pleasant 42 year old female with past medical history significant for central pain syndrome coming in with a complaint of left heel pain. Patient has had this pain for quite some time and seems to be worsening over the course of the last month or so. Patient states it seems to be worse with first steps in the morning and after sitting for long amount of time. Patient did have a dull throbbing ache to the sensation at the end of the day. Patient is on chronic orthotics for her central pain syndrome and does not seem to be helping. Patient states the pain is mostly on the medial left heel as well as the plantar aspect of the foot. Denies any numbness or tingling. Patient rates the pain 8/10.   Past medical, surgical, family and social history reviewed. Medications reviewed all in the electronic medical record.   Review of Systems: No headache, visual changes, nausea, vomiting, diarrhea, constipation, dizziness, abdominal pain, skin rash, fevers, chills, night sweats, weight loss, swollen lymph nodes, body aches, joint swelling, muscle aches, chest pain, shortness of breath, mood changes.   Objective:    Blood pressure 112/76, pulse 103, weight 215 lb (97.523 kg), SpO2 97.00%.   General: No apparent distress alert and oriented x3 mood and affect normal, dressed appropriately.  HEENT: Pupils equal, extraocular movements intact Respiratory: Patient's speak in full sentences and does not appear short of breath Cardiovascular: No lower extremity edema, non tender, no erythema, regular and rhythm no murmur Skin: Warm dry intact with no signs of infection or rash on extremities or on axial skeleton. Abdomen: Soft nontender Neuro: Cranial nerves II through XII are intact, neurovascularly intact in all extremities with 2+ DTRs and 2+ pulses. Lymph: No lymphadenopathy of posterior or anterior  cervical chain or axillae bilaterally.  Gait normal with good balance and coordination. Gait analysis does show patient though walks in a supinated state MSK: Non tender with full range of motion and good stability and symmetric strength and tone of shoulders, elbows, wrist, hip, knee and ankles bilaterally.  Normal inspection with no visable or palpable fat pad atrophy and no visible swelling/erythema. Left foot exam Patient is tender at medial insertion of plantar fascia into calcaneus. Great toe motion: Mild heart rigidus Arch shape: Pes planus with over pronation of the hindfoot with standing. Other foot breakdown: Mild breakdown of the transverse arch giving splaying of the first and second toe. Patient has same splaying on the contralateral side.  MSK US performed of: Left This study was ordered, performed, and interpreted by Terrilee Files D.O.  Foot/Ankle:   All structures visualized.   Talar dome unremarkable  Ankle mortise without effusion. Peroneus longus and brevis tendons unremarkable on long and transverse views without sheath effusions. Posterior tibialis, flexor hallucis longus, and flexor digitorum longus tendons unremarkable on long and transverse views without sheath effusions. Achilles tendon visualized along length of tendon and unremarkable on long and transverse views without sheath effusion. Anterior Talofibular Ligament and Calcaneofibular Ligaments unremarkable and intact. Deltoid Ligament unremarkable and intact. Plantar fascia intact but does have hypoechoic changes surrounding the area of its origin. Calcaneus does have a bone spur on the left. This does have mild increased Doppler flow. Patient's plantar fascia measure 0.95 cm compared to the contralateral side a 0.79cm  Power doppler signal normal.  IMPRESSION:  Plantar fasciitis with bone  spur left heel.    Impression and Recommendations:     This case required medical decision making of moderate  complexity.

## 2013-07-12 NOTE — Patient Instructions (Signed)
Very nice to meet you Please read handouts on Plantar Fascitis.  STRETCHING and Strengthening program critically important.  Strengthening on foot and calf muscles as seen in handout. Calf raises, 2 legged, on step 30 reps 1 set a day for 1 week, then 2 sets a day for 1 week then 3 sets a day for 3 weeks.  Foot massage with tennis ball. Ice  Baths if you can tolerate for 10 minutes 2 times daily.   Towel Scrunches: get a towel or hand towel, use toes to pick up and scrunch up the towel.  Marble pick-ups, practice picking up marbles with toes and placing into a cup  NEEDS TO BE DONE EVERY DAY  Recommended over the counter insoles. ([employer] at [employer])  A rigid shoe with good arch support helps: Dansko (great), Lelon Frohlich No easily bendable shoes.   Tuli's heel cups  Come back again in 3 weeks.  If still in pain we will make orthotics.[name]

## 2013-07-26 ENCOUNTER — Ambulatory Visit
Admission: RE | Admit: 2013-07-26 | Discharge: 2013-07-26 | Disposition: A | Discharge status: Home / Self Care | Payer: BC Managed Care – PPO | Source: Ambulatory Visit | Attending: Internal Medicine | Admitting: Internal Medicine

## 2013-07-26 ENCOUNTER — Ambulatory Visit: Payer: BC Managed Care – PPO

## 2013-07-26 DIAGNOSIS — Z1239 Encounter for other screening for malignant neoplasm of breast: Secondary | ICD-10-CM

## 2013-08-12 ENCOUNTER — Telehealth: Payer: Self-pay | Admitting: *Deleted

## 2013-08-12 ENCOUNTER — Ambulatory Visit: Payer: BC Managed Care – PPO | Admitting: Internal Medicine

## 2013-08-12 NOTE — Telephone Encounter (Signed)
[facility] Fax: [phone] From: [facility] Date/ Time: 08/10/2013 2:54 PM Taken By: Annalee Genta, CSR Caller: Huntley Dec Facility: not collected Patient: Amanda Vasquez, Amanda Vasquez DOB: [birth year] Phone: [phone] Reason for Call: See info below Regarding Appointment: Yes Appt Date: 08/12/2013 Appt Time: 1:00:00 AM Provider: Rene Paci (Adults only) Reason: Cancel Appointment Details: Pt has conflicting appt; please call at your earliest convenience to reschedule. Outcome: Cancelled appointment in EPIC [facility])

## 2013-09-05 ENCOUNTER — Encounter: Payer: Self-pay | Admitting: Internal Medicine

## 2013-09-05 DIAGNOSIS — G894 Chronic pain syndrome: Secondary | ICD-10-CM

## 2013-09-06 ENCOUNTER — Telehealth: Payer: Self-pay

## 2013-09-06 NOTE — Telephone Encounter (Signed)
We made this referral via earlier MyChart communication on this topic  [facility] should be arranging same thanks

## 2013-09-06 NOTE — Telephone Encounter (Signed)
The patient called and is in need of a referral to her pain specialist. She states she has an apt on Monday, and due to her new insurance coverage, is in need of a referral.

## 2013-09-06 NOTE — Telephone Encounter (Signed)
Printed and gave msg too [facility]...Amanda Vasquez

## 2013-09-24 ENCOUNTER — Telehealth: Payer: Self-pay | Admitting: Internal Medicine

## 2013-09-24 DIAGNOSIS — F308 Other manic episodes: Secondary | ICD-10-CM

## 2013-09-24 NOTE — Telephone Encounter (Signed)
Refer done.

## 2013-09-24 NOTE — Telephone Encounter (Signed)
Pt called requesting a referral for her [employer] insurance to Dr. Dustin Flock.  She has not presented her new card.  She states she emailed the card id.  She was very rude on the phone.  Her appt is today.

## 2013-09-25 ENCOUNTER — Other Ambulatory Visit: Payer: Self-pay | Admitting: Internal Medicine

## 2013-09-30 ENCOUNTER — Telehealth: Payer: Self-pay | Admitting: *Deleted

## 2013-09-30 NOTE — Telephone Encounter (Signed)
Received PA form from [employer] pt needing PA on her victoza. PA was completed and faxed back. Med was denied. Place on md desk to review...Johny Chess

## 2013-10-01 MED ORDER — LIRAGLUTIDE 18 MG/3ML ~~LOC~~ SOPN: 1.2000 mg | PEN_INJECTOR | Freq: Every day | SUBCUTANEOUS | Status: DC

## 2013-10-01 NOTE — Telephone Encounter (Signed)
Notified pt with md response.../lmb 

## 2013-10-01 NOTE — Telephone Encounter (Signed)
PA from [place] states they'll authorize 1.2 mg daily dosing of Vicotza -  If diabetes is not well-controlled with this dose, we can then increase back to maximum dose New prescription requesting 6 pens per month has been sent to pharmacy Please notify patient of this change Thanks

## 2013-10-02 ENCOUNTER — Other Ambulatory Visit: Payer: Self-pay | Admitting: Internal Medicine

## 2013-10-13 ENCOUNTER — Other Ambulatory Visit: Payer: Self-pay | Admitting: Family Medicine

## 2013-11-21 LAB — HM DIABETES EYE EXAM

## 2013-11-28 ENCOUNTER — Encounter: Payer: Self-pay | Admitting: Internal Medicine

## 2013-12-12 ENCOUNTER — Telehealth: Payer: Self-pay | Admitting: *Deleted

## 2013-12-12 MED ORDER — MELOXICAM 15 MG PO TABS: 15.0000 mg | ORAL_TABLET | Freq: Every day | ORAL | Status: DC

## 2013-12-12 NOTE — Telephone Encounter (Signed)
Refill done.  

## 2014-02-02 ENCOUNTER — Other Ambulatory Visit: Payer: Self-pay | Admitting: Internal Medicine

## 2014-02-03 MED ORDER — VALACYCLOVIR HCL 1 G PO TABS: ORAL_TABLET | ORAL | Status: DC

## 2014-02-12 ENCOUNTER — Other Ambulatory Visit: Payer: Self-pay | Admitting: Internal Medicine

## 2014-02-19 LAB — HM MAMMOGRAPHY

## 2014-05-22 ENCOUNTER — Ambulatory Visit (INDEPENDENT_AMBULATORY_CARE_PROVIDER_SITE_OTHER): Payer: 59

## 2014-05-22 ENCOUNTER — Encounter: Payer: Self-pay | Admitting: Family Medicine

## 2014-05-22 ENCOUNTER — Ambulatory Visit (INDEPENDENT_AMBULATORY_CARE_PROVIDER_SITE_OTHER): Payer: 59 | Admitting: Family Medicine

## 2014-05-22 VITALS — BP 122/72 | HR 93 | Ht 64.0 in | Wt 231.0 lb

## 2014-05-22 DIAGNOSIS — M79672 Pain in left foot: Secondary | ICD-10-CM

## 2014-05-22 DIAGNOSIS — Z23 Encounter for immunization: Secondary | ICD-10-CM

## 2014-05-22 DIAGNOSIS — M722 Plantar fascial fibromatosis: Secondary | ICD-10-CM

## 2014-05-22 NOTE — Progress Notes (Signed)
CC: left heel pain.   HPI: Patient is a very pleasant 43 year old female with past medical history significant for chronic pain syndrome coming in for left heel pain. Patient was seen approximately 1 year ago and was found to have plantar fasciitis. Patient was given a suggestions of conservative therapy. Patient states she is doing significantly well but unfortunately over the course last several weeks she has started having increasing pain again. Patient never was pain free. Patient states though that she has been wearing her orthotics last and that seems to be making the pain more. Patient states that there seems to be about mostly in the middle aspect of her foot that is also giving her difficulty. Patient states that this is affecting her gait. Patient does have significant number of different pain medications and doesn't seem to be helping.   Past medical, surgical, family and social history reviewed. Medications reviewed all in the electronic medical record.   Review of Systems: No headache, visual changes, nausea, vomiting, diarrhea, constipation, dizziness, abdominal pain, skin rash, fevers, chills, night sweats, weight loss, swollen lymph nodes, body aches, joint swelling, muscle aches, chest pain, shortness of breath, mood changes.   Objective:    Blood pressure 122/72, pulse 93, height 5\' 4"  (1.626 m), weight 231 lb (104.781 kg), SpO2 98.00%.   General: No apparent distress alert and oriented x3 mood and affect normal, dressed appropriately.  HEENT: Pupils equal, extraocular movements intact Respiratory: Patient's speak in full sentences and does not appear short of breath Cardiovascular: No lower extremity edema, non tender, no erythema, regular and rhythm no murmur Skin: Warm dry intact with no signs of infection or rash on extremities or on axial skeleton. Abdomen: Soft nontender Neuro: Cranial nerves II through XII are intact, neurovascularly intact in all extremities with 2+ DTRs  and 2+ pulses. Lymph: No lymphadenopathy of posterior or anterior cervical chain or axillae bilaterally.  Gait normal with good balance and coordination. Gait analysis does show patient though walks in a supinated state MSK: Non tender with full range of motion and good stability and symmetric strength and tone of shoulders, elbows, wrist, hip, knee and ankles bilaterally.  Normal inspection with no visable or palpable fat pad atrophy and no visible swelling/erythema. Left foot exam Patient is tender at medial insertion of plantar fascia into calcaneus. Great toe motion: Mild heart rigidus Arch shape: Pes planus with over pronation of the hindfoot with standing. Other foot breakdown: Mild breakdown of the transverse arch giving splaying of the first and second toe. Patient has same splaying on the contralateral side.  MSK US performed of: Left This study was ordered, performed, and interpreted by Charlann Boxer D.O.  Foot/Ankle:   All structures visualized.   Talar dome unremarkable  Ankle mortise without effusion. Peroneus longus and brevis tendons unremarkable on long and transverse views without sheath effusions. Posterior tibialis, flexor hallucis longus, and flexor digitorum longus tendons unremarkable on long and transverse views without sheath effusions. Achilles tendon visualized along length of tendon and unremarkable on long and transverse views without sheath effusion. Anterior Talofibular Ligament and Calcaneofibular Ligaments unremarkable and intact. Deltoid Ligament unremarkable and intact. Plantar fascia intact but does have hypoechoic changes surrounding the area of its origin. This does have mild increased Doppler flow. Patient's plantar fascia measure 01.25 cm compared to the contralateral side a 0.79cm  Power doppler signal normal.  IMPRESSION:  Plantar fasciitis with increasing swelling from previous exam.  Procedure: Real-time Ultrasound Guided Injection of left plantar  fasciitis Device: GE Logiq E  Ultrasound guided injection is preferred based studies that show increased duration, increased effect, greater accuracy, decreased procedural pain, increased response rate, and decreased cost with ultrasound guided versus blind injection.  Verbal informed consent obtained.  Time-out conducted.  Noted no overlying erythema, induration, or other signs of local infection.  Skin prepped in a sterile fashion.  Local anesthesia: Topical Ethyl chloride.  With sterile technique and under real time ultrasound guidance:  With a 25-gauge 1 inch needle coming in from the medial approach patient was injected with 1 cc of 0.5% Marcaine and 1 cc a caliper milligrams per deciliter at the origin of the plantar fascia at the inferior anterior aspect of the calcaneus. Completed without difficulty  Pain immediately resolved suggesting accurate placement of the medication.  Advised to call if fevers/chills, erythema, induration, drainage, or persistent bleeding.  Images permanently stored and available for review in the ultrasound unit.  Impression: Technically successful ultrasound guided injection.  Impression and Recommendations:     This case required medical decision making of moderate complexity.

## 2014-05-22 NOTE — Assessment & Plan Note (Signed)
Patient was given an injection today and tolerated the procedure very well. Patient had near complete resolution of pain immediately. Patient told to wear the orthotics a more regular basis. We discussed proper shoe wear and an icing protocol. Patient will try these interventions and come back and see me again in 3 weeks. Due to patient's comorbidities this may be difficult to treat. I will hope that we can do this without any surgical intervention.  Spent greater than 25 minutes with patient face-to-face and had greater than 50% of counseling including as described above in assessment and plan.

## 2014-05-22 NOTE — Patient Instructions (Signed)
Good to see you again Ice is when you need it.  Continue the exercises 3 times a week and the stairs exercises daily.  Ice if you can 20 minutes daily.  See me again in 3 weeks.

## 2014-06-10 LAB — HM DIABETES EYE EXAM

## 2014-06-12 ENCOUNTER — Encounter: Payer: Self-pay | Admitting: Family Medicine

## 2014-06-12 ENCOUNTER — Ambulatory Visit (INDEPENDENT_AMBULATORY_CARE_PROVIDER_SITE_OTHER): Payer: 59 | Admitting: Family Medicine

## 2014-06-12 VITALS — BP 130/82 | HR 84 | Ht 64.0 in | Wt 230.0 lb

## 2014-06-12 DIAGNOSIS — G8929 Other chronic pain: Secondary | ICD-10-CM | POA: Insufficient documentation

## 2014-06-12 DIAGNOSIS — M67472 Ganglion, left ankle and foot: Secondary | ICD-10-CM | POA: Insufficient documentation

## 2014-06-12 DIAGNOSIS — M25572 Pain in left ankle and joints of left foot: Secondary | ICD-10-CM

## 2014-06-12 DIAGNOSIS — M25579 Pain in unspecified ankle and joints of unspecified foot: Secondary | ICD-10-CM

## 2014-06-12 NOTE — Patient Instructions (Signed)
Good to se eyou Your foot will be sore in 6 hours.  Ice is your friend.  Continue the exercises for your foot.  Wear the ankle brace during the day and lets see if that helps the pain at night See me again in 10-14 days and if still in pain I will make you custom orthotics.

## 2014-06-12 NOTE — Assessment & Plan Note (Signed)
The patient was given a lateral ankle stabilization brace today. We discussed icing and home exercises the patient was given a CD with the exercises as well as theraband. Patient will also do and icing protocol. Patient and will come back in 2 weeks for further evaluation. This likely will be a chronic problem but hopefully we can make some improvement.  Spent greater than 25 minutes with patient face-to-face and had greater than 50% of counseling including as described above in assessment and plan.

## 2014-06-12 NOTE — Progress Notes (Signed)
CC: left heel pain. Left foot pain  HPI: Patient is a very pleasant 43 year old female with past medical history significant for chronic pain syndrome coming in for left heel pain. Patient did have a crit concern injection at last visit. Patient states that her heel pain is better but she has noticed significant more pain in the arch. Patient has an area that she notices that feels like she is walking on a pebble at all times. Patient states that this is very severe. Patient has tried over-the-counter orthotics, new shoes, icing and home exercises with no significant treatment. Patient states that the severity is 7/10.  Patient also states that she is having lateral ankle pain. Patient has had this for some time and does have some known foot drop. Patient is in a long day she has swelling and has had pain. He said he can keep her up at night. Patient has tried icing that is moderately helpful. Denies any more weakness than usual.   Past medical, surgical, family and social history reviewed. Medications reviewed all in the electronic medical record.   Review of Systems: No headache, visual changes, nausea, vomiting, diarrhea, constipation, dizziness, abdominal pain, skin rash, fevers, chills, night sweats, weight loss, swollen lymph nodes, body aches, joint swelling, muscle aches, chest pain, shortness of breath, mood changes.   Objective:    Blood pressure 130/82, pulse 84, height 5\' 4"  (1.626 m), weight 230 lb (104.327 kg).   General: No apparent distress alert and oriented x3 mood and affect normal, dressed appropriately.  HEENT: Pupils equal, extraocular movements intact Respiratory: Patient's speak in full sentences and does not appear short of breath Cardiovascular: No lower extremity edema, non tender, no erythema, regular and rhythm no murmur Skin: Warm dry intact with no signs of infection or rash on extremities or on axial skeleton. Abdomen: Soft nontender Neuro: Cranial nerves II  through XII are intact, neurovascularly intact in all extremities with 2+ DTRs and 2+ pulses. Lymph: No lymphadenopathy of posterior or anterior cervical chain or axillae bilaterally.  Gait normal with good balance and coordination. Gait analysis does show patient though walks in a supinated state MSK: Non tender with full range of motion and good stability and symmetric strength and tone of shoulders, elbows, wrist, hip, knee and ankles bilaterally.  Normal inspection with no visable or palpable fat pad atrophy and no visible swelling/erythema. Left foot exam Patient is tender at medial insertion of plantar fascia into calcaneus. Great toe motion: Mild heart rigidus Arch shape: Pes planus with over pronation of the hindfoot with standing. Other foot breakdown: Mild breakdown of the transverse arch giving splaying of the first and second toe. Patient has same splaying on the contralateral side.  MSK US performed of: Left This study was ordered, performed, and interpreted by Charlann Boxer D.O.  Foot/Ankle:   Limited exam shows the patient does have a plantar cyst of wrist hypoechoic changes on the plantar aspect superficial to the plantaris muscle.  IMPRESSION:  Plantar cyst noted over plantaris muscle  Procedure: Real-time Ultrasound Guided aspiration of plantar ganglion cyst. Device: GE Logiq E  Ultrasound guided injection is preferred based studies that show increased duration, increased effect, greater accuracy, decreased procedural pain, increased response rate, and decreased cost with ultrasound guided versus blind injection.  Verbal informed consent obtained.  Time-out conducted.  Noted no overlying erythema, induration, or other signs of local infection.  Skin prepped in a sterile fashion.  Local anesthesia: Topical Ethyl chloride.  With sterile technique  and under real time ultrasound guidance:  With a 25-gauge 1 inch needle coming in from the medial approach patient was injected  with 1 cc of 0.5% Marcaine over the ganglion cyst. Patient then had compression on either side of the cyst and did have clear-like fluid removed. Completed without difficulty  Pain immediately resolved suggesting accurate placement of the medication.  Advised to call if fevers/chills, erythema, induration, drainage, or persistent bleeding.  Images permanently stored and available for review in the ultrasound unit.  Impression: Technically successful ultrasound guided injection.  Impression and Recommendations:     This case required medical decision making of moderate complexity.

## 2014-06-12 NOTE — Assessment & Plan Note (Signed)
Was aspirated today. We discussed icing protocol, we discussed postinjection instructions. Patient and will come back and see me again in 10-14 days to further evaluate. Patient continues to have difficulty out consider making patient custom orthotics.

## 2014-07-02 ENCOUNTER — Ambulatory Visit (INDEPENDENT_AMBULATORY_CARE_PROVIDER_SITE_OTHER): Payer: 59 | Admitting: Family Medicine

## 2014-07-02 ENCOUNTER — Encounter: Payer: Self-pay | Admitting: Family Medicine

## 2014-07-02 VITALS — BP 102/68 | HR 89 | Ht 64.0 in | Wt 233.0 lb

## 2014-07-02 DIAGNOSIS — M67472 Ganglion, left ankle and foot: Secondary | ICD-10-CM

## 2014-07-02 NOTE — Patient Instructions (Signed)
You are dong great Ice is still your friend Continue the exercises at least 3 times a week.  We change the orthotics Can try to aspirate again in 6 weeks if needed Otherwise if you are doing well see me when you see me.

## 2014-07-02 NOTE — Progress Notes (Signed)
CC: left heel pain. Left foot pain  HPI: Patient is a very pleasant 43 year old female with past medical history significant for chronic pain syndrome coming in for left heel pain. Patient was seen previously and had a ganglion cyst on the plantar aspect of her foot that was aspirated. Patient states that she is approximately 50% better. Patient states that she walks more than 2 miles the she continues to have pain on the plantar aspect of the foot. Is wearing the over-the-counter orthotics with minimal improvement. Patient feels like we are going in the right direction but still too much pain to work out consistently she states..   Past medical, surgical, family and social history reviewed. Medications reviewed all in the electronic medical record.   Review of Systems: No headache, visual changes, nausea, vomiting, diarrhea, constipation, dizziness, abdominal pain, skin rash, fevers, chills, night sweats, weight loss, swollen lymph nodes, body aches, joint swelling, muscle aches, chest pain, shortness of breath, mood changes.   Objective:    Blood pressure 102/68, pulse 89, height 5\' 4"  (1.626 m), weight 233 lb (105.688 kg), SpO2 96 %.   General: No apparent distress alert and oriented x3 mood and affect normal, dressed appropriately.  HEENT: Pupils equal, extraocular movements intact Respiratory: Patient's speak in full sentences and does not appear short of breath Cardiovascular: No lower extremity edema, non tender, no erythema, regular and rhythm no murmur Skin: Warm dry intact with no signs of infection or rash on extremities or on axial skeleton. Abdomen: Soft nontender Neuro: Cranial nerves II through XII are intact, neurovascularly intact in all extremities with 2+ DTRs and 2+ pulses. Lymph: No lymphadenopathy of posterior or anterior cervical chain or axillae bilaterally.  Gait normal with good balance and coordination. Gait analysis does show patient though walks in a supinated  state MSK: Non tender with full range of motion and good stability and symmetric strength and tone of shoulders, elbows, wrist, hip, knee and ankles bilaterally.  Normal inspection with no visable or palpable fat pad atrophy and no visible swelling/erythema. Left foot exam Patient is tender at medial insertion of plantar fascia into calcaneus. Great toe motion: Mild hallux rigidus Arch shape: Pes planus with over pronation of the hindfoot with standing. Other foot breakdown: Mild breakdown of the transverse arch giving splaying of the first and second toe. Patient has same splaying on the contralateral side.     Impression and Recommendations:     This case required medical decision making of moderate complexity.

## 2014-07-02 NOTE — Assessment & Plan Note (Signed)
Patient 2 weeks ago did have aspiration of the ganglion cyst and it is significantly smaller than it was previously. I do believe the patient may need another aspiration but I would like to wait another 4 weeks until reattempt this. Patient did have modifications done of her over-the-counter orthotics today which did help improve the pain. Encourage patient to follow up with me again in 4 weeks. Continuing to have discomfort we will either consider custom orthotics or another aspiration of the ganglion cyst.  Spent greater than 25 minutes with patient face-to-face and had greater than 50% of counseling including as described above in assessment and plan.

## 2014-07-22 ENCOUNTER — Other Ambulatory Visit: Payer: Self-pay | Admitting: Internal Medicine

## 2014-08-11 ENCOUNTER — Ambulatory Visit: Payer: 59 | Admitting: Family Medicine

## 2014-08-11 ENCOUNTER — Other Ambulatory Visit: Payer: Self-pay | Admitting: Internal Medicine

## 2014-08-26 ENCOUNTER — Telehealth: Payer: Self-pay | Admitting: Internal Medicine

## 2014-08-26 DIAGNOSIS — N912 Amenorrhea, unspecified: Secondary | ICD-10-CM

## 2014-08-26 DIAGNOSIS — G890 Central pain syndrome: Secondary | ICD-10-CM

## 2014-08-26 DIAGNOSIS — F308 Other manic episodes: Secondary | ICD-10-CM

## 2014-08-26 NOTE — Telephone Encounter (Signed)
Pt requesting a few referrals: pt has changed plans still [facility] ----member ME#[id number] QIW#[id number] Dr Elta Guadeloupe [facility]  [facility] Dr Lestine Box - [phone], [facility]  [phone] pt's #

## 2014-08-27 MED ORDER — ATORVASTATIN CALCIUM 10 MG PO TABS: 10.0000 mg | ORAL_TABLET | Freq: Every day | ORAL | Status: DC

## 2014-08-27 NOTE — Telephone Encounter (Signed)
Refers done thanks

## 2014-10-21 ENCOUNTER — Other Ambulatory Visit: Payer: Self-pay | Admitting: Internal Medicine

## 2014-10-23 ENCOUNTER — Telehealth: Payer: Self-pay | Admitting: Internal Medicine

## 2014-10-23 ENCOUNTER — Other Ambulatory Visit: Payer: Self-pay

## 2014-10-23 MED ORDER — METFORMIN HCL 1000 MG PO TABS: 1000.0000 mg | ORAL_TABLET | Freq: Two times a day (BID) | ORAL | Status: DC

## 2014-10-23 NOTE — Telephone Encounter (Signed)
Patient is scheduled to have CPE with [facility] on 11/13/2014. In the meantime, she requests refill of  atorvastatin (LIPITOR) 10 MG tablet [[id number]]  metFORMIN (GLUCOPHAGE) 1000 MG tablet [[id number]]  [facility]

## 2014-10-23 NOTE — Telephone Encounter (Signed)
erx for metformin done. rx for atorvastatin was done in January for 3 month supply with refills.

## 2014-10-28 ENCOUNTER — Other Ambulatory Visit: Payer: Self-pay | Admitting: *Deleted

## 2014-10-28 ENCOUNTER — Encounter: Payer: Self-pay | Admitting: Internal Medicine

## 2014-10-28 MED ORDER — METFORMIN HCL 1000 MG PO TABS: 1000.0000 mg | ORAL_TABLET | Freq: Two times a day (BID) | ORAL | Status: DC

## 2014-10-28 MED ORDER — ATORVASTATIN CALCIUM 10 MG PO TABS: 10.0000 mg | ORAL_TABLET | Freq: Every day | ORAL | Status: DC

## 2014-10-28 NOTE — Telephone Encounter (Signed)
Pt sent email needing refill send on her atorvastatin & metformin...Johny Chess

## 2014-11-13 ENCOUNTER — Encounter: Payer: 59 | Admitting: Internal Medicine

## 2014-11-28 ENCOUNTER — Other Ambulatory Visit (INDEPENDENT_AMBULATORY_CARE_PROVIDER_SITE_OTHER): Payer: 59

## 2014-11-28 ENCOUNTER — Ambulatory Visit (INDEPENDENT_AMBULATORY_CARE_PROVIDER_SITE_OTHER): Payer: 59 | Admitting: Internal Medicine

## 2014-11-28 ENCOUNTER — Encounter: Payer: Self-pay | Admitting: Internal Medicine

## 2014-11-28 ENCOUNTER — Telehealth: Payer: Self-pay

## 2014-11-28 VITALS — BP 138/72 | HR 112 | Temp 98.3°F | Resp 18 | Ht 64.0 in | Wt 226.0 lb

## 2014-11-28 DIAGNOSIS — E119 Type 2 diabetes mellitus without complications: Secondary | ICD-10-CM

## 2014-11-28 DIAGNOSIS — E785 Hyperlipidemia, unspecified: Secondary | ICD-10-CM

## 2014-11-28 DIAGNOSIS — J209 Acute bronchitis, unspecified: Secondary | ICD-10-CM

## 2014-11-28 DIAGNOSIS — E669 Obesity, unspecified: Secondary | ICD-10-CM | POA: Diagnosis not present

## 2014-11-28 DIAGNOSIS — E1169 Type 2 diabetes mellitus with other specified complication: Secondary | ICD-10-CM

## 2014-11-28 DIAGNOSIS — B009 Herpesviral infection, unspecified: Secondary | ICD-10-CM

## 2014-11-28 LAB — COMPREHENSIVE METABOLIC PANEL
ALT: 11 U/L (ref 0–35)
AST: 13 U/L (ref 0–37)
Albumin: 4.1 g/dL (ref 3.5–5.2)
Alkaline Phosphatase: 59 U/L (ref 39–117)
BUN: 13 mg/dL (ref 6–23)
CO2: 25 mEq/L (ref 19–32)
Calcium: 9.4 mg/dL (ref 8.4–10.5)
Chloride: 103 mEq/L (ref 96–112)
Creatinine, Ser: 0.74 mg/dL (ref 0.40–1.20)
GFR: 90.31 mL/min (ref >60.00)
Glucose, Bld: 78 mg/dL (ref 70–99)
Potassium: 4.2 mEq/L (ref 3.5–5.1)
Sodium: 136 mEq/L (ref 135–145)
Total Bilirubin: 0.3 mg/dL (ref 0.2–1.2)
Total Protein: 7.3 g/dL (ref 6.0–8.3)

## 2014-11-28 LAB — LIPID PANEL
Cholesterol: 148 mg/dL (ref 0–200)
HDL: 60.6 mg/dL (ref >39.00)
LDL Cholesterol: 67 mg/dL (ref 0–99)
NonHDL: 87.4
Total CHOL/HDL Ratio: 2
Triglycerides: 101 mg/dL (ref 0.0–149.0)
VLDL: 20.2 mg/dL (ref 0.0–40.0)

## 2014-11-28 LAB — HEMOGLOBIN A1C: Hgb A1c MFr Bld: 7 % — ABNORMAL HIGH (ref 4.6–6.5)

## 2014-11-28 MED ORDER — EXENATIDE ER 2 MG ~~LOC~~ SUSR: 2.0000 mg | SUBCUTANEOUS | Status: DC

## 2014-11-28 MED ORDER — AZITHROMYCIN 250 MG PO TABS: ORAL_TABLET | ORAL | Status: DC

## 2014-11-28 MED ORDER — ALBUTEROL SULFATE HFA 108 (90 BASE) MCG/ACT IN AERS: 2.0000 | INHALATION_SPRAY | Freq: Four times a day (QID) | RESPIRATORY_TRACT | Status: DC | PRN

## 2014-11-28 MED ORDER — VALACYCLOVIR HCL 1 G PO TABS: ORAL_TABLET | ORAL | Status: DC

## 2014-11-28 MED ORDER — ALBUTEROL SULFATE (2.5 MG/3ML) 0.083% IN NEBU: 2.5000 mg | INHALATION_SOLUTION | Freq: Four times a day (QID) | RESPIRATORY_TRACT | Status: DC | PRN

## 2014-11-28 NOTE — Assessment & Plan Note (Signed)
Azithromycin and albuterol inhaler. She will continue with allegra-d and mucinex as needed.

## 2014-11-28 NOTE — Assessment & Plan Note (Signed)
Check HgA1c today, renal function, lipid panel. She will switch to bydureon for weekly dosing and continue glipizide and metformin. No side effects or low sugars since last visit.

## 2014-11-28 NOTE — Progress Notes (Signed)
   Subjective:    Patient ID: Amanda Vasquez, female    DOB: [birth year], 44 y.o.   MRN: [id number]  HPI The patient is a 44 YO female who is coming in for wellness but is having an acute cough and fevers for the last 5 days. She was exposed to someone who was sick last weekend. She is still taking allergy medicine otc (allegra-d and mucinex). She usually takes zyrtec daily but that was not doing well enough. She has been coughing up green mucus since that time. Fevers and chills. Feels like she is wheezing and SOB. Not getting better, but getting worse.   PMH, [facility], social history reviewed and updated.  Review of Systems  HENT: Positive for congestion, postnasal drip, rhinorrhea and sinus pressure. Negative for ear discharge, ear pain and trouble swallowing.   Respiratory: Positive for cough, chest tightness, shortness of breath and wheezing.        With anxiety  Cardiovascular: Negative for chest pain, palpitations and leg swelling.  Gastrointestinal: Negative for diarrhea and constipation.  Musculoskeletal: Positive for myalgias, back pain and arthralgias.  Skin: Negative.   Neurological: Positive for weakness and numbness.      Objective:   Physical Exam  Constitutional: She is oriented to person, place, and time. She appears well-developed and well-nourished.  Overweight  HENT:  Head: Normocephalic and atraumatic.  Oropharynx with erythema, nares with crusting  Eyes: EOM are normal.  Neck: Normal range of motion.  Cardiovascular: Normal rate and regular rhythm.   Pulmonary/Chest: Effort normal.  Diffuse wheezing which does not fully clear with coughing  Abdominal: Soft.  Neurological: She is alert and oriented to person, place, and time. Coordination normal.  Skin: Skin is warm and dry.   Filed Vitals:   11/28/14 1442  BP: 138/72  Pulse: 112  Temp: 98.3 F (36.8 C)  TempSrc: Oral  Resp: 18  Height: 5\' 4"  (1.626 m)  Weight: 226 lb (102.513 kg)  SpO2: 90%        Assessment & Plan:

## 2014-11-28 NOTE — Progress Notes (Signed)
Pre visit review using our clinic review tool, if applicable. No additional management support is needed unless otherwise documented below in the visit note. 

## 2014-11-28 NOTE — Assessment & Plan Note (Signed)
Refill valtrex with recent infection she is worried about a blister on her lips.

## 2014-11-28 NOTE — Telephone Encounter (Signed)
Pharmacist called pt request inhaler vs nebulizer. Rx for inhaler sent e-script

## 2014-11-28 NOTE — Assessment & Plan Note (Signed)
Taking atorvastatin at this time and will check lipid panel.

## 2014-11-28 NOTE — Patient Instructions (Signed)
We will check on the blood work today for the diabetes and call you back with the results.   We will call in an antibiotic called azithromycin for the bronchitis. Take 2 pills today then 1 pill a day starting tomorrow until it is gone. We have also given you an albuterol inhaler that should help you to breathe better while your body heals itself.   Acute Bronchitis Bronchitis is inflammation of the airways that extend from the windpipe into the lungs (bronchi). The inflammation often causes mucus to develop. This leads to a cough, which is the most common symptom of bronchitis.  In acute bronchitis, the condition usually develops suddenly and goes away over time, usually in a couple weeks. Smoking, allergies, and asthma can make bronchitis worse. Repeated episodes of bronchitis may cause further lung problems.  CAUSES Acute bronchitis is most often caused by the same virus that causes a cold. The virus can spread from person to person (contagious) through coughing, sneezing, and touching contaminated objects. SIGNS AND SYMPTOMS   Cough.   Fever.   Coughing up mucus.   Body aches.   Chest congestion.   Chills.   Shortness of breath.   Sore throat.  DIAGNOSIS  Acute bronchitis is usually diagnosed through a physical exam. Your health care provider will also ask you questions about your medical history. Tests, such as chest X-rays, are sometimes done to rule out other conditions.  TREATMENT  Acute bronchitis usually goes away in a couple weeks. Oftentimes, no medical treatment is necessary. Medicines are sometimes given for relief of fever or cough. Antibiotic medicines are usually not needed but may be prescribed in certain situations. In some cases, an inhaler may be recommended to help reduce shortness of breath and control the cough. A cool mist vaporizer may also be used to help thin bronchial secretions and make it easier to clear the chest.  HOME CARE INSTRUCTIONS  Get  plenty of rest.   Drink enough fluids to keep your urine clear or pale yellow (unless you have a medical condition that requires fluid restriction). Increasing fluids may help thin your respiratory secretions (sputum) and reduce chest congestion, and it will prevent dehydration.   Take medicines only as directed by your health care provider.  If you were prescribed an antibiotic medicine, finish it all even if you start to feel better.  Avoid smoking and secondhand smoke. Exposure to cigarette smoke or irritating chemicals will make bronchitis worse. If you are a smoker, consider using nicotine gum or skin patches to help control withdrawal symptoms. Quitting smoking will help your lungs heal faster.   Reduce the chances of another bout of acute bronchitis by washing your hands frequently, avoiding people with cold symptoms, and trying not to touch your hands to your mouth, nose, or eyes.   Keep all follow-up visits as directed by your health care provider.  SEEK MEDICAL CARE IF: Your symptoms do not improve after 1 week of treatment.  SEEK IMMEDIATE MEDICAL CARE IF:  You develop an increased fever or chills.   You have chest pain.   You have severe shortness of breath.  You have bloody sputum.   You develop dehydration.  You faint or repeatedly feel like you are going to pass out.  You develop repeated vomiting.  You develop a severe headache. MAKE SURE YOU:   Understand these instructions.  Will watch your condition.  Will get help right away if you are not doing well or get worse.  Document Released: 09/15/2004 Document Revised: 12/23/2013 Document Reviewed: 01/29/2013 [facility] Patient Information 2015 [place], [place]. This information is not intended to replace advice given to you by your health care provider. Make sure you discuss any questions you have with your health care provider.

## 2014-12-06 ENCOUNTER — Encounter: Payer: Self-pay | Admitting: Internal Medicine

## 2014-12-08 ENCOUNTER — Telehealth: Payer: Self-pay | Admitting: Internal Medicine

## 2014-12-08 NOTE — Telephone Encounter (Signed)
Patient is calling to follow up on below email. brochitis is not improving with current regimen.

## 2014-12-08 NOTE — Telephone Encounter (Signed)
I called and spoke with patient and she will call and schedule an office visit if she does not feel better.

## 2014-12-08 NOTE — Telephone Encounter (Signed)
She does not need any more antibiotics. Recommend tylenol or ibuprofen for the fever. If she is still feeling poorly recommend visit.

## 2014-12-10 ENCOUNTER — Telehealth: Payer: Self-pay | Admitting: *Deleted

## 2014-12-10 NOTE — Telephone Encounter (Signed)
[place] Night - Client TELEPHONE ADVICE RECORD [facility] Call Center Patient Name: Amanda Vasquez Gender: Female DOB: [birth year] Age: 44 Y 68 M 29 D Return Phone Number: [phone] (Primary) Address: [address] City/State/Zip: [place] [address] Client [place] Primary [place] Night - Client Client Site [place] - Night Physician [place], [place] Type Call Call Type Triage / Clinical Relationship To Patient Self Return Phone Number [phone] (Primary) Chief Complaint Cough Initial Comment Caller states she has a fever and cough. Dx with Bronchitis on Friday. [place] Not Listed fast med PreDisposition Home Care Nurse Assessment Nurse: Jimmye Norman, RN, [place] Date/Time (Eastern Time): 12/05/2014 6:14:48 PM Confirm and document reason for call. If symptomatic, describe symptoms. ---caller states she has a temperature of 99.4 orally and a cough and was diagnosed with bronchitis last Friday. states she took a zpack and albuterol inhaler. Has the patient traveled out of the country within the last 30 days? ---Not Applicable Does the patient require triage? ---Yes Related visit to physician within the last 2 weeks? ---Yes Does the PT have any chronic conditions? (i.e. diabetes, asthma, etc.) ---Yes List chronic conditions. ---type 2 diabetes Did the patient indicate they were pregnant? ---No Guidelines Guideline Title Affirmed Question Affirmed Notes Nurse Date/Time (Eastern Time) Cough - Acute Productive Wheezing is present Jimmye Norman, Therapist, sports, Loree Fee 12/05/2014 6:22:30 PM Disp. Time Eilene Ghazi Time) Disposition Final User 12/05/2014 6:27:59 PM See Physician within 4 Hours (or PCP triage) Yes Jimmye Norman, RN, Loree Fee Caller Understands: Yes Disagree/Comply: Comply PLEASE NOTE: All timestamps contained within this report are represented as [place] Standard Time. CONFIDENTIALTY NOTICE: This fax transmission is intended only  for the addressee. It contains information that is legally privileged, confidential or otherwise protected from use or disclosure. If you are not the intended recipient, you are strictly prohibited from reviewing, disclosing, copying using or disseminating any of this information or taking any action in reliance on or regarding this information. If you have received this fax in error, please notify us immediately by telephone so that we can arrange for its return to [place]. Phone: [phone], Toll-Free: [phone], Fax: [phone] Page: 2 of 2 Call Id: [id number] Care Advice Given Per Guideline SEE PHYSICIAN WITHIN 4 HOURS (or PCP triage): * IF NO PCP TRIAGE: You need to be seen. Go to _______________ (ED/UCC or office if it will be open) within the next 3 or 4 hours. Go sooner if you become worse. CALL BACK IF: * You become worse. After Care Instructions Given Call Event Type User Date / Time Description Comments User: Myriam Forehand, RN Date/Time ([place] Time): 12/05/2014 6:31:28 PM pt requesting physician be paged to see if they would call something in, RN advised that she is not able to page for new medications. pt states she is not sure if she is going to go and be seen or not. Referrals GO TO FACILITY UNDECIDED

## 2014-12-22 ENCOUNTER — Encounter: Payer: Self-pay | Admitting: Family

## 2014-12-22 ENCOUNTER — Ambulatory Visit (INDEPENDENT_AMBULATORY_CARE_PROVIDER_SITE_OTHER)
Admission: RE | Admit: 2014-12-22 | Discharge: 2014-12-22 | Disposition: A | Discharge status: Home / Self Care | Payer: 59 | Source: Ambulatory Visit | Attending: Family | Admitting: Family

## 2014-12-22 ENCOUNTER — Ambulatory Visit (INDEPENDENT_AMBULATORY_CARE_PROVIDER_SITE_OTHER): Payer: 59 | Admitting: Family

## 2014-12-22 VITALS — BP 110/72 | HR 111 | Temp 98.8°F | Resp 20 | Ht 64.0 in | Wt 231.0 lb

## 2014-12-22 DIAGNOSIS — R059 Cough, unspecified: Secondary | ICD-10-CM

## 2014-12-22 DIAGNOSIS — R05 Cough: Secondary | ICD-10-CM

## 2014-12-22 MED ORDER — PREDNISONE 20 MG PO TABS: 20.0000 mg | ORAL_TABLET | Freq: Two times a day (BID) | ORAL | Status: DC

## 2014-12-22 MED ORDER — METHYLPREDNISOLONE ACETATE 80 MG/ML IJ SUSP
80.0000 mg | Freq: Once | INTRAMUSCULAR | Status: AC
  Administered 2014-12-22: 80 mg via INTRAMUSCULAR

## 2014-12-22 MED ORDER — LEVOFLOXACIN 500 MG PO TABS: 500.0000 mg | ORAL_TABLET | Freq: Every day | ORAL | Status: DC

## 2014-12-22 MED ORDER — ALBUTEROL SULFATE (2.5 MG/3ML) 0.083% IN NEBU
5.0000 mg | INHALATION_SOLUTION | Freq: Once | RESPIRATORY_TRACT | Status: AC
  Administered 2014-12-22: 5 mg via RESPIRATORY_TRACT

## 2014-12-22 NOTE — Progress Notes (Signed)
Pre visit review using our clinic review tool, if applicable. No additional management support is needed unless otherwise documented below in the visit note. 

## 2014-12-22 NOTE — Patient Instructions (Signed)
Thank you for choosing [facility].  Summary/Instructions:  Your prescription(s) have been submitted to your pharmacy or been printed and provided for you. Please take as directed and contact our office if you believe you are having problem(s) with the medication(s) or have any questions.  Please stop by radiology on the basement level of the building for your x-rays. Your results will be released to MyChart (or called to you) after review, usually within 72 hours after test completion. If any treatments or changes are necessary, you will be notified at that same time.  If your symptoms worsen or fail to improve, please contact our office for further instruction, or in case of emergency go directly to the emergency room at the closest medical facility.   General Recommendations:    Please drink plenty of fluids.  Get plenty of rest   Sleep in humidified air  Use saline nasal sprays  Netti pot   OTC Medications:  Decongestants - helps relieve congestion   Flonase (generic fluticasone) or Nasacort (generic triamcinolone) - please make sure to use the "cross-over" technique at a 45 degree angle towards the opposite eye as opposed to straight up the nasal passageway.   Sudafed (generic pseudoephedrine - Note this is the one that is available behind the pharmacy counter); Products with phenylephrine (-PE) may also be used but is often not as effective as pseudoephedrine.   If you have HIGH BLOOD PRESSURE - Coricidin HBP; AVOID any product that is -D as this contains pseudoephedrine which may increase your blood pressure.  Afrin (oxymetazoline) every 6-8 hours for up to 3 days.   Allergies - helps relieve runny nose, itchy eyes and sneezing   Claritin (generic loratidine), Allegra (fexofenidine), or Zyrtec (generic cyrterizine) for runny nose. These medications should not cause drowsiness.  Note - Benadryl (generic diphenhydramine) may be used however may cause  drowsiness  Cough -   Delsym or Robitussin (generic dextromethorphan)  Expectorants - helps loosen mucus to ease removal   Mucinex (generic guaifenesin) as directed on the package.  Headaches / General Aches   Tylenol (generic acetaminophen) - DO NOT EXCEED 3 grams (3,000 mg) in a 24 hour time period  Advil/Motrin (generic ibuprofen)   Sore Throat -   Salt water gargle   Chloraseptic (generic benzocaine) spray or lozenges / Sucrets (generic dyclonine)     

## 2014-12-22 NOTE — Progress Notes (Signed)
Subjective:    Patient ID: Amanda Vasquez, female    DOB: 20-May-1971, 44 y.o.   MRN: [id number]  Chief Complaint  Patient presents with  . Cough    states she was here April 8th with the same sxs, zpak did not help, wheezing, SOB, cough, fever that started back last night,     HPI:  Amanda Vasquez is a 44 y.o. female with a PMH of obesity, hyperlipidemia, bipolar, type 2 diabetes, and anxiety who presents today for an acute office visit.  Patient was recently seen in the office approximately one month ago and was diagnosed with acute bronchitis which was treated with azithromycin. She indicates that her azithromycin did not help. Her current associated symptoms include shortness of breath, wheezing, cough, and fever. Modifying factors include mucinex, steam inhalations, and Tylenol which has not helped very much. Severity of the cough is enough to make her short of breath and feel dizzy at times.   Allergies  Allergen Reactions  . Latex   . Morphine   . Prednisone     Current Outpatient Prescriptions on File Prior to Visit  Medication Sig Dispense Refill  . albuterol (PROVENTIL HFA;VENTOLIN HFA) 108 (90 BASE) MCG/ACT inhaler Inhale 2 puffs into the lungs every 6 (six) hours as needed for wheezing or shortness of breath. 1 Inhaler 11  . ALPRAZolam (XANAX) 0.5 MG tablet Take 0.5 mg by mouth 3 (three) times daily.     [name] atorvastatin (LIPITOR) 10 MG tablet Take 1 tablet (10 mg total) by mouth daily. 90 tablet 0  . azithromycin (ZITHROMAX) 250 MG tablet Day 1 take 2 pills, Days 2-5 take 1 pill daily. 6 tablet 0  . b complex vitamins tablet Take 1 tablet by mouth daily.      [name] buPROPion (BUDEPRION XL) 300 MG 24 hr tablet 300 mg. Take 300 mg by mouth daily.    . Cetirizine HCl 10 MG CAPS Take by mouth.    . Cholecalciferol (VITAMIN D3) 2000 UNITS TABS Take by mouth daily.      [name] docusate sodium (COLACE) 100 MG capsule Take 100 mg by mouth 2 (two) times daily as needed.     . Exenatide ER  (BYDUREON) 2 MG SUSR Inject 2 mg into the skin once a week. 4 each 6  . Gabapentin Enacarbil 600 MG TB24 Take 600 mg by mouth at bedtime. 30 tablet   . glipiZIDE (GLUCOTROL) 5 MG tablet Take 1 tablet (5 mg total) by mouth daily. 30 tablet 5  . glucose blood test strip Use as instructed 100 each 12  . magnesium oxide (MAG-OX) 400 MG tablet Take 400 mg by mouth daily.    . meloxicam (MOBIC) 15 MG tablet Take 1 tablet (15 mg total) by mouth daily. 30 tablet 3  . metFORMIN (GLUCOPHAGE) 1000 MG tablet Take 1 tablet (1,000 mg total) by mouth 2 (two) times daily with a meal. 180 tablet 0  . morphine (MS CONTIN) 60 MG 12 hr tablet Take 60 mg by mouth every 12 (twelve) hours.    . Multiple Vitamin (MULTIVITAMIN) tablet Take 1 tablet by mouth daily.      . multivitamin-iron-minerals-folic acid (CENTRUM) chewable tablet Chew 1 tablet by mouth daily.    [name] NEEDLE, DISP, 25 G (BD ECLIPSE) 25G X 1-1/2" MISC Use to administer vitamin b12 monthly 12 each 0  . ondansetron (ZOFRAN) 4 MG tablet Take as needed for nausea    . oxyCODONE (OXY IR/ROXICODONE) 5 MG  immediate release tablet 5 mg. Take 5 mg by mouth every 4 (four) hours as needed for Pain.    . traZODone (DESYREL) 100 MG tablet 100 mg. Take 100 mg by mouth nightly.    . valACYclovir (VALTREX) 1000 MG tablet TAKE 1 TABLET EVERY DAY AS NEEDED 60 tablet 0  . vitamin C (ASCORBIC ACID) 500 MG tablet Take 500 mg by mouth as needed.      No current facility-administered medications on file prior to visit.     Review of Systems  Constitutional: Positive for fever.  HENT: Positive for congestion and sinus pressure. Negative for sore throat.   Respiratory: Positive for cough, chest tightness and shortness of breath.       Objective:    BP 110/72 mmHg  Pulse 111  Temp(Src) 98.8 F (37.1 C) (Oral)  Resp 20  Ht 5\' 4"  (1.626 m)  Wt 231 lb (104.781 kg)  BMI 39.63 kg/m2  SpO2 92% Nursing note and vital signs reviewed.  Physical Exam  Constitutional: She  is oriented to person, place, and time. She appears well-developed and well-nourished. No distress.  HENT:  Right Ear: Hearing, tympanic membrane, external ear and ear canal normal.  Left Ear: Hearing, tympanic membrane, external ear and ear canal normal.  Nose: Right sinus exhibits maxillary sinus tenderness. Left sinus exhibits maxillary sinus tenderness.  Mouth/Throat: Uvula is midline, oropharynx is clear and moist and mucous membranes are normal.  Cardiovascular: Normal rate, regular rhythm, normal heart sounds and intact distal pulses.   Pulmonary/Chest: Effort normal. She has wheezes.  Neurological: She is alert and oriented to person, place, and time.  Skin: Skin is warm and dry.  Psychiatric: She has a normal mood and affect. Her behavior is normal. Judgment and thought content normal.       Assessment & Plan:

## 2014-12-22 NOTE — Assessment & Plan Note (Addendum)
Previously treated with azithromycin for bronchitis. Symptoms appeared to have restarted and question potential pneumonia versus bronchitis or airway inflammation. Obtain chest x-ray to rule out pneumonia. In office albuterol given and cough improved slightly.  In office Depo-Medrol given. Start Levaquin. Start prednisone. Note that prednisone is on allergy list, however patient indicates it is an intolerance as opposed to true allergy and has taken the medication previously. Continue albuterol as needed for wheezing. Patient instructed if symptoms are not relieved with current interventions to seek further emergency care if shortness of breath and wheezing continue.

## 2014-12-24 ENCOUNTER — Other Ambulatory Visit: Payer: Self-pay | Admitting: Internal Medicine

## 2014-12-24 NOTE — Telephone Encounter (Signed)
Left message advising patient rx for lipitor has been sent in

## 2015-01-20 ENCOUNTER — Ambulatory Visit (INDEPENDENT_AMBULATORY_CARE_PROVIDER_SITE_OTHER): Payer: 59 | Admitting: Internal Medicine

## 2015-01-20 ENCOUNTER — Other Ambulatory Visit (INDEPENDENT_AMBULATORY_CARE_PROVIDER_SITE_OTHER): Payer: 59

## 2015-01-20 ENCOUNTER — Encounter: Payer: Self-pay | Admitting: Internal Medicine

## 2015-01-20 VITALS — BP 118/76 | HR 96 | Temp 98.5°F | Resp 16 | Wt 229.1 lb

## 2015-01-20 DIAGNOSIS — R5383 Other fatigue: Secondary | ICD-10-CM | POA: Diagnosis not present

## 2015-01-20 DIAGNOSIS — M25541 Pain in joints of right hand: Secondary | ICD-10-CM

## 2015-01-20 DIAGNOSIS — M5010 Cervical disc disorder with radiculopathy, unspecified cervical region: Secondary | ICD-10-CM | POA: Diagnosis not present

## 2015-01-20 DIAGNOSIS — M79642 Pain in left hand: Secondary | ICD-10-CM | POA: Diagnosis not present

## 2015-01-20 DIAGNOSIS — M25542 Pain in joints of left hand: Secondary | ICD-10-CM

## 2015-01-20 DIAGNOSIS — G379 Demyelinating disease of central nervous system, unspecified: Secondary | ICD-10-CM | POA: Insufficient documentation

## 2015-01-20 DIAGNOSIS — M79641 Pain in right hand: Secondary | ICD-10-CM

## 2015-01-20 LAB — CBC WITH DIFFERENTIAL/PLATELET
Basophils Absolute: 0 10*3/uL (ref 0.0–0.1)
Basophils Relative: 0.5 % (ref 0.0–3.0)
Eosinophils Absolute: 0.2 10*3/uL (ref 0.0–0.7)
Eosinophils Relative: 2.8 % (ref 0.0–5.0)
HCT: 39.5 % (ref 36.0–46.0)
Hemoglobin: 13.3 g/dL (ref 12.0–15.0)
Lymphocytes Relative: 38.3 % (ref 12.0–46.0)
Lymphs Abs: 2.6 10*3/uL (ref 0.7–4.0)
MCHC: 33.7 g/dL (ref 30.0–36.0)
MCV: 87.5 fl (ref 78.0–100.0)
Monocytes Absolute: 0.4 10*3/uL (ref 0.1–1.0)
Monocytes Relative: 6.2 % (ref 3.0–12.0)
Neutro Abs: 3.5 10*3/uL (ref 1.4–7.7)
Neutrophils Relative %: 52.2 % (ref 43.0–77.0)
Platelets: 258 10*3/uL (ref 150.0–400.0)
RBC: 4.52 Mil/uL (ref 3.87–5.11)
RDW: 13.6 % (ref 11.5–15.5)
WBC: 6.7 10*3/uL (ref 4.0–10.5)

## 2015-01-20 LAB — RHEUMATOID FACTOR: Rhuematoid fact SerPl-aCnc: <10 IU/mL (ref <=14)

## 2015-01-20 LAB — T4, FREE: Free T4: 0.77 ng/dL (ref 0.60–1.60)

## 2015-01-20 LAB — TSH: TSH: 0.65 u[IU]/mL (ref 0.35–4.50)

## 2015-01-20 LAB — SEDIMENTATION RATE: Sed Rate: 31 mm/hr — ABNORMAL HIGH (ref 0–22)

## 2015-01-20 NOTE — Patient Instructions (Signed)
  Your next office appointment will be determined based upon review of your pending labs. Those written interpretation of the lab results and instructions will be transmitted to you by My Chart  Critical results will be called.   Followup as needed for any active or acute issue. Please report any significant change in your symptoms. 

## 2015-01-20 NOTE — Progress Notes (Signed)
Pre visit review using our clinic review tool, if applicable. No additional management support is needed unless otherwise documented below in the visit note. 

## 2015-01-20 NOTE — Progress Notes (Signed)
   Subjective:    Patient ID: Amanda Vasquez, female    DOB: 1971-07-14, 44 y.o.   MRN: [id number]  HPI She presents with a complicated history. She has had shooting pain from the right neck to the right hand since November 2015. There was no injury or specific trigger for this. She has a long history of "demyelinating syndrome". She's been evaluated by Dr Krista Blue @[facility] as well as by Dr Moshe Cipro @ [facility].  She last saw the Neurologist at [facility] 03/02/2012. Apparently she became upset that he did not want to pursue MS treatments for her. She was referred to a psychologist;I find no record this was completed  Subsequently she's been seen at [facility]. She saw hand surgeon there as well as Dr. Beola Cord who diagnosed "RSD". She received spinal injection by Dr. Nelva Bush without benefit. She now sees Dr. Nicholaus Bloom in the pain clinic  She states that she's on disability related to her condition. She describes being almost totally bedridden for months due to the pain which is diffuse in nature .In addition to the right upper extremity symptoms ; she states her hands are gripped tightly in the morning upon awakening and this lasts for hours  Another major concern is fatigue. She does have cold intolerance. She also has constipation which she attributes to medications.  On 11/28/14 chemistries, electrolytes, liver function, and kidney function were all normal. Lipids were excellent. A1c was 7%. She has had no TSH since January 2014. The value at that time was 0.69.   Review of Systems  No sleep apnea has been diagnosed to date.  She denies melena or rectal bleeding.    Objective:   Physical Exam  She has intermittent facial grimacing and atypical facial movements.  She has S4 tachycardia.  Abdomen is protuberant. Ventral hernia is present.  She has decreased strength to opposition in the LEFT hand but not the right.  General appearance is one of good nourishment w/o  distress. Eyes: No conjunctival inflammation or scleral icterus is present. Oral exam: Dental hygiene is good; lips and gums are healthy appearing.There is no oropharyngeal erythema or exudate noted.  Heart:  Slight tachycardia with regular rhythm. S1 and S2 normal without gallop, murmur, click, rub or other extra sounds   Lungs:Chest clear to auscultation; no wheezes, rhonchi,rales ,or rubs present.No increased work of breathing.  Abdomen: bowel sounds normal, soft and non-tender without masses, organomegaly or hernias noted.  No guarding or rebound .  Musculoskeletal: Able to lie flat and sit up without help. Negative straight leg raising bilaterally. Gait normal Skin:Warm & dry.  Intact without suspicious lesions or rashes ; no jaundice or tenting Lymphatic: No lymphadenopathy is noted about the head, neck, axilla            Assessment & Plan:  #1 radicular pain from neck to right upper extremity  #2 Past history of demyelinating process  #3 fatigue  Plan: See orders and recommendations.

## 2015-01-23 ENCOUNTER — Other Ambulatory Visit: Payer: Self-pay | Admitting: Internal Medicine

## 2015-02-02 ENCOUNTER — Ambulatory Visit (INDEPENDENT_AMBULATORY_CARE_PROVIDER_SITE_OTHER): Payer: 59 | Admitting: Neurology

## 2015-02-02 ENCOUNTER — Encounter: Payer: Self-pay | Admitting: Neurology

## 2015-02-02 VITALS — BP 120/80 | HR 87 | Temp 98.6°F | Ht 64.0 in | Wt 223.4 lb

## 2015-02-02 DIAGNOSIS — R29898 Other symptoms and signs involving the musculoskeletal system: Secondary | ICD-10-CM

## 2015-02-02 DIAGNOSIS — M6289 Other specified disorders of muscle: Secondary | ICD-10-CM

## 2015-02-02 DIAGNOSIS — G35 Multiple sclerosis: Secondary | ICD-10-CM | POA: Diagnosis not present

## 2015-02-02 DIAGNOSIS — R202 Paresthesia of skin: Secondary | ICD-10-CM | POA: Diagnosis not present

## 2015-02-02 DIAGNOSIS — R531 Weakness: Secondary | ICD-10-CM

## 2015-02-02 NOTE — Progress Notes (Signed)
[facility]    Provider:  Dr Jaynee Eagles Referring Provider: Rowe Clack, MD Primary Care Physician:  Unice Cobble, MD  CC:  Radiculopathy and abnormal white matter   HPI:  Amanda Vasquez is a 44 y.o. female here as a referral from Dr. Asa Lente for  complicated symptoms. Former Therapist, sports. Sounds like she has had a long journey and has been evaluated by multiple neurologists, pain specialists and had many pain procedures all of which have failed to find an etiology for her symptoms.  One of her complaints is right arm pain. She has some shooting pain from the right side of the neck to the hand which started in November and have worsened. Goes into all 5 fingers. Feels like her whole hand is burning, like it is going to "shoot off", she can't make a fist, worse at night, she gets swollen lumps in her right upper arm. She can push on the base of her neck and actually cause the pain to shoot down her arm. She also has longer-standing pain symptoms on the entire left side of the body, splits midline to the left almost exactly from head to toe, with sensitivity of skin. She has left-sided weakness. She has fatigue. She went to [facility] for MRIs and follow up in 2011 and 2012 for abnormal white matter changes. She also saw Dr. Krista Blue in 2011 and 2012. She has been evaluated by multiple neurologists for the same reasons, last 4 years ago as reported by patient. She was following at [facility] with Dr. Moshe Cipro. Has not had an MRi since 2012. No vision changes, has ringing and buzzing in her ears. Left sided issues started in 2011 when she was originally evaluated and diagnosed with a "demyelinating disorder". The left-sided symptoms are worsening. Cold makes her left-sided symptoms worse, she can't get out of bed in the winter. She is on pain medications, the gralise and oxycodone makes her symptoms better. She takes morphine ER. She is on disability due to her condition and follows in pain clinic. She has also  seen Dr. Krista Blue here at [facility]. She was diagnosed with RSD in the left foot and arm at some point and saw Dr. Nelva Bush in 2012 who tried doing multiple cervical and lumbar spinal blocks which did not help. She was also seen at [facility] and they did a spinal nerve ablation which did not work longer than a day. By 3-4pm she can't even hold up her head anymore, she has to prop it up against something because of tightness. She tries to get out more now since it is warmer, it takes her 45 minutes to get up and out of the bed in the mornings. In the cold weather she is bed ridden. She was adopted and she does not have infomration about birth family.   Reviewed notes, labs and imaging from outside physicians, which showed: MRI  of the brain dated June 2012 showed a single nonenhancing 5 x 3 mm focus of increased T2 FLAIR signal intensity within the sub-occipital white matter of the right parietal lobe. No enhancing lesions.MRI of the cervical spine showed no disc protrusion, spinal canal or neuroforaminal stenosis at any level. Both were done with and without contrast.  She sees Dr. Unice Cobble who is her primary care. He also notes this reported "demyelinating syndrome" per patient. She was seen at [facility] by Dr. Moshe Cipro and I [facility] neurology by Dr. Benjamin Stain. She was last seen at [facility] in July 2013  and apparently she became upset when MS treatments were not pursued. A hand surgeon diagnosed reflex sympathetic dystrophy . She follows in pain clinic.  Review of Systems: Patient complains of symptoms per HPI as well as the following symptoms: fatigue, ringin in the ears, constipation, joint pain, joint swelling. Pertinent negatives per HPI. All others negative.   History   Social History  . Marital Status: Divorced    Spouse Name: N/A  . Number of Children: 0  . Years of Education: 12+   Occupational History  . RN- long-term disability     Social History Main Topics  . Smoking status:  Former Smoker    Types: Cigarettes, Cigars    Quit date: 08/22/2008  . Smokeless tobacco: Not on file     Comment: Divorced, lives with boyfriend  . Alcohol Use: No     Comment: Quit drinking in 2010  . Drug Use: No  . Sexual Activity: Not on file   Other Topics Concern  . Not on file   Social History Narrative   Lives at home with herself.   Caffeine use: 1 cup coffee 3 times per week    Family History  Problem Relation Age of Onset  . Adopted: Yes    Past Medical History  Diagnosis Date  . DIABETES MELLITUS, TYPE II   . ALLERGIC RHINITIS   . ANXIETY   . PARESTHESIA   . OBESITY   . AMENORRHEA   . DISORDER, BIPOLAR, ATYPICAL MANIC   . DYSLIPIDEMIA   . Thalamic pain syndrome (hyperesthetic)     follows with Dr Hardin Negus for same, on disability since 09/2011 for same  . Migraine   . Demyelinating disorder   . Central pain syndrome     Past Surgical History  Procedure Laterality Date  . Breast surgery  1997, 2006    Reduction  . Cholecystectomy  1998  . Intrauterine device insertion  2009  . Knee surgery Right 1993    Current Outpatient Prescriptions  Medication Sig Dispense Refill  . ALPRAZolam (XANAX) 0.5 MG tablet Take 0.5 mg by mouth 3 (three) times daily.     [name] atorvastatin (LIPITOR) 10 MG tablet TAKE ONE TABLET EVERY DAY 90 tablet 1  . b complex vitamins tablet Take 1 tablet by mouth daily.      [name] buPROPion (BUDEPRION XL) 300 MG 24 hr tablet 300 mg. Take 300 mg by mouth daily.    . Cetirizine HCl 10 MG CAPS Take by mouth.    . Cholecalciferol (VITAMIN D3) 2000 UNITS TABS Take 500 Units by mouth daily.     . Exenatide ER (BYDUREON) 2 MG SUSR Inject 2 mg into the skin once a week. 4 each 6  . Gabapentin Enacarbil 600 MG TB24 Take 600 mg by mouth at bedtime. (Patient taking differently: Take 1,800 mg by mouth at bedtime. ) 30 tablet   . glipiZIDE (GLUCOTROL) 5 MG tablet Take 1 tablet (5 mg total) by mouth daily. 90 tablet 3  . glucose blood test strip Use as  instructed 100 each 12  . metFORMIN (GLUCOPHAGE) 1000 MG tablet Take 1 tablet (1,000 mg total) by mouth 2 (two) times daily with a meal. 180 tablet 0  . morphine (MS CONTIN) 60 MG 12 hr tablet Take 60 mg by mouth every 12 (twelve) hours.    . multivitamin-iron-minerals-folic acid (CENTRUM) chewable tablet Chew 1 tablet by mouth daily.    [name] NEEDLE, DISP, 25 G (BD ECLIPSE) 25G X 1-1/2" MISC  Use to administer vitamin b12 monthly 12 each 0  . ondansetron (ZOFRAN) 4 MG tablet Take as needed for nausea    . oxyCODONE (OXY IR/ROXICODONE) 5 MG immediate release tablet 5 mg. Take 5 mg by mouth every 4 (four) hours as needed for Pain.    . polyethylene glycol (MIRALAX / GLYCOLAX) packet Take 17 g by mouth daily as needed.    . traZODone (DESYREL) 150 MG tablet Take 150 mg by mouth daily.    . valACYclovir (VALTREX) 1000 MG tablet TAKE 1 TABLET EVERY DAY AS NEEDED 60 tablet 0  . Wheat Dextrin (BENEFIBER PO) Take by mouth as needed.     No current facility-administered medications for this visit.    Allergies as of 02/02/2015 - Review Complete 02/02/2015  Allergen Reaction Noted  . Latex  07/03/2008  . Prednisone  06/22/2011    Vitals: BP 120/80 mmHg  Pulse 87  Temp(Src) 98.6 F (37 C) (Oral)  Ht 5\' 4"  (1.626 m)  Wt 223 lb 6.4 oz (101.334 kg)  BMI 38.33 kg/m2 Last Weight:  Wt Readings from Last 1 Encounters:  02/02/15 223 lb 6.4 oz (101.334 kg)   Last Height:   Ht Readings from Last 1 Encounters:  02/02/15 5\' 4"  (1.626 m)    Physical exam: Exam: Gen: NAD, conversant, well nourised, obese, well groomed                     CV: RRR, no MRG. No Carotid Bruits. No peripheral edema, warm, nontender Eyes: Conjunctivae clear without exudates or hemorrhage  Neuro: Detailed Neurologic Exam  Speech:    Speech is normal; fluent and spontaneous with normal comprehension.  Cognition:    The patient is oriented to person, place, and time;     recent and remote memory intact;     language  fluent;     normal attention, concentration,     fund of knowledge Cranial Nerves:    The pupils are equal, round, and reactive to light. The fundi are flat. Visual fields are full to finger confrontation. Extraocular movements are intact. Trigeminal sensation is intact and the muscles of mastication are normal. The face is symmetric. The palate elevates in the midline. Hearing intact. Voice is normal. Shoulder shrug is normal. The tongue has normal motion without fasciculations.   Coordination:    Normal finger to nose and heel to shin.   Gait:    Heel-toe and tandem gait are normal.   Motor Observation:    No asymmetry, no atrophy, and no involuntary movements noted. Tone:    Normal muscle tone.    Posture:    Posture is normal. normal erect    Strength:    Strength is V/V in the upper and lower limbs.      Sensation: Splits midline from the face to the thorax with hyperalgesia and dysesthesias on the left. Also on the left arm and leg.      Reflex Exam:  DTR's: absent achilles otherwise deep tendon reflexes in the upper and lower extremities are normal bilaterally.   Toes:    The toes are equivocal bilaterally.   Clonus:    Clonus is absent.      Assessment/Plan:  44 year old patient here with multiple complaints. She has a complicated past medical history which includes patient reported "demyelinating disorder"and has followed-up with multiple neurologists including at [facility] but never treated. She is on disability and is practically bedridden in the winter months due to  dysesthesias and hyperalgesia of the left side of her body, interestingly enough she splits exactly midline. She has right sided neck pain with radicular symptoms into the right arm. She has chronic fatigue and chronic pain syndrome. At one point there was apparently a mention of reflex sympathetic dystrophy I don't see any indication of that on exam. In fact her exam was surprisingly unremarkable  except for the left sided sensory problems as above. Strength appears to be intact.   Will repeat MRI of the brain and MR of the cervical cord with and without contrast EMG nerve conduction study on the right arm She's never had a lumbar puncture, depending on findings on MRI of the brain and cervical cord may just recommend to rule out MS.  Sarina Ill, MD  [facility] [address] [place], [place] [address]  Phone [phone] Fax [phone]

## 2015-02-02 NOTE — Patient Instructions (Signed)
Overall you are doing fairly well but I do want to suggest a few things today:   Remember to drink plenty of fluid, eat healthy meals and do not skip any meals. Try to eat protein with a every meal and eat a healthy snack such as fruit or nuts in between meals. Try to keep a regular sleep-wake schedule and try to exercise daily, particularly in the form of walking, 20-30 minutes a day, if you can.   As far as diagnostic testing: MRi of the brain and cervical cord  I would like to see you back within the next 2-4 weeks for emg/ncs of the right arm, sooner if we need to. Please call us with any interim questions, concerns, problems, updates or refill requests.   Please also call us for any test results so we can go over those with you on the phone.  My clinical assistant and will answer any of your questions and relay your messages to me and also relay most of my messages to you.   Our phone number is [phone]. We also have an after hours call service for urgent matters and there is a physician on-call for urgent questions. For any emergencies you know to call 911 or go to the nearest emergency room

## 2015-02-11 ENCOUNTER — Ambulatory Visit (INDEPENDENT_AMBULATORY_CARE_PROVIDER_SITE_OTHER): Payer: 59

## 2015-02-11 DIAGNOSIS — G35 Multiple sclerosis: Secondary | ICD-10-CM | POA: Diagnosis not present

## 2015-02-11 DIAGNOSIS — R29898 Other symptoms and signs involving the musculoskeletal system: Secondary | ICD-10-CM | POA: Diagnosis not present

## 2015-02-11 DIAGNOSIS — G35D Multiple sclerosis, unspecified: Secondary | ICD-10-CM

## 2015-02-11 DIAGNOSIS — R202 Paresthesia of skin: Secondary | ICD-10-CM

## 2015-02-12 MED ORDER — GADOPENTETATE DIMEGLUMINE 469.01 MG/ML IV SOLN: 20.0000 mL | Freq: Once | INTRAVENOUS | Status: AC | PRN

## 2015-02-16 ENCOUNTER — Other Ambulatory Visit: Payer: Self-pay

## 2015-02-16 ENCOUNTER — Telehealth: Payer: Self-pay | Admitting: Neurology

## 2015-02-16 DIAGNOSIS — G35 Multiple sclerosis: Secondary | ICD-10-CM

## 2015-02-16 DIAGNOSIS — I639 Cerebral infarction, unspecified: Secondary | ICD-10-CM

## 2015-02-16 NOTE — Telephone Encounter (Signed)
Called patient to discuss imaging.   MRI cervical Spine:   This is a mildly abnormal MRI of the cervical spine showing mild degenerative changes at C3-C4 and C5-C6 that do not lead to any nerve root impingement. The spinal cord appears normal.   MRi brain : This MRI of the brain with and without contrast shows a T2/flair hyperintense focus in the deep white matter of the right hemisphere corresponding to the focus reported in 05/13/2010 thatt no longer shows hyperintensity on diffusion-weighted images. Additionally, there are about 5 other punctate T2/flair foci in the subcortical white matter, most of which were present on the prior MRI. These foci are nonspecific and could be due to chronic microvascular ischemic changes, migraine or cardio-emboli. Demyelination would be less likely to have this appearance. No acute findings are noted.

## 2015-02-17 NOTE — Telephone Encounter (Signed)
Spoke to patient. Relayed information. Will order a lumbar puncture to evaluate for MS and an echocardiogram to eval for cardioembolic etiology.

## 2015-02-17 NOTE — Telephone Encounter (Signed)
Patient called returning Dr Cathren Laine call. Please call and advise.

## 2015-02-17 NOTE — Telephone Encounter (Signed)
This patient needs a lumbar puncture, orders placed thanks

## 2015-02-18 NOTE — Telephone Encounter (Signed)
Faxed orders for LP to [facility]) at 9:23am on 02/18/15 to have pt scheduled. Received fax confirmation.

## 2015-02-18 NOTE — Telephone Encounter (Signed)
Already discussed with her in detail last night. Can you get her scheduled for a Lumbar Puncture please? thanks

## 2015-02-25 ENCOUNTER — Ambulatory Visit (INDEPENDENT_AMBULATORY_CARE_PROVIDER_SITE_OTHER): Payer: 59 | Admitting: Neurology

## 2015-02-25 ENCOUNTER — Ambulatory Visit (INDEPENDENT_AMBULATORY_CARE_PROVIDER_SITE_OTHER): Payer: Self-pay | Admitting: Neurology

## 2015-02-25 DIAGNOSIS — G5602 Carpal tunnel syndrome, left upper limb: Secondary | ICD-10-CM

## 2015-02-25 DIAGNOSIS — G5603 Carpal tunnel syndrome, bilateral upper limbs: Secondary | ICD-10-CM

## 2015-02-25 DIAGNOSIS — G5601 Carpal tunnel syndrome, right upper limb: Secondary | ICD-10-CM | POA: Diagnosis not present

## 2015-02-25 DIAGNOSIS — Z0289 Encounter for other administrative examinations: Secondary | ICD-10-CM

## 2015-02-25 DIAGNOSIS — G5600 Carpal tunnel syndrome, unspecified upper limb: Secondary | ICD-10-CM | POA: Insufficient documentation

## 2015-02-25 DIAGNOSIS — R202 Paresthesia of skin: Secondary | ICD-10-CM

## 2015-02-25 DIAGNOSIS — R29898 Other symptoms and signs involving the musculoskeletal system: Secondary | ICD-10-CM

## 2015-02-25 NOTE — Progress Notes (Signed)
See procedure note.

## 2015-02-25 NOTE — Procedures (Signed)
[facility]    Provider:  Dr Jaynee Eagles Referring Provider: Hendricks Limes, MD Primary Care Physician:  Gwendolyn Grant, MD  HPI:  Amanda Vasquez is a 44 y.o. female here as a referral from Dr. Linna Darner for bilateral arm pain. She has some shooting pain from the right side of the neck to the hand which started in November and has worsened. Symptoms affect all 5 fingers. Feels like her whole hand is burning, like it is going to "shoot off", she can't make a fist, worse at night. She also has longer-standing pain symptoms on the left with left arm weakness. MRI of the cervical spine showed mild degenerative changes at C3-C4 and C5-C6 that do not lead to any nerve root impingement.  Summary:   Nerve Conduction Studies were performed on the bilateral upper extremities.  The right median APB motor nerve showed prolonged distal onset latency (7.2 ms, N<4.0). The right Median 2nd Digit sensory nerve showed prolonged distal peak latency (6.4 ms, N<3.9) and reduced amplitude (5uV, N>10) F Wave studies indicate that the right Median F wave has delayed latency(35.0, N<39ms).  The left median APB motor nerve showed prolonged distal onset latency (5.1 ms, N<4.0). The left Median 2nd Digit sensory nerve showed prolonged distal peak latency (4.4 ms, N<3.9).  F Wave studies indicate that the left Median F wave has normal latency.   Bilateral Ulnar ADM motor nerves were within normal limits. F Wave studies indicate that the bilateral Ulnar F waves have normal latencies The bilateralUlnar 5th digit sensory nerves were within normal limits. The bilateral Radial sensory nerves were within normal limits.  The right lumbrical comparison nerve showed abnormal peak latency difference (Median-Ulnar,1.0 ms, N<0.5) with a relative median delay.   The left lumbrical comparison nerve showed borderline peak latency difference (Median-Ulnar, 0.50ms, N<0.5) with a relative median delay.    EMG  needle study of selected right upper extremity muscles was performed. The following muscles were normal: Deltoid, Triceps, Pronator Teres, Opponens Pollicis, First Dorsal Interosseous.  Conclusion: This is an abnormal study. There is electrophysiologic evidence of bilateral, moderately-severe, Right > Left Carpal Tunnel Syndrome. No suggestion of polyneuropathy or radiculopathy. Clinical correlation recommended.   Sarina Ill, MD  [facility] [address] [place], [place] [address]  Phone [phone] Fax [phone]

## 2015-02-25 NOTE — Progress Notes (Signed)
  [facility]    Provider:  Dr Jaynee Eagles Referring Provider: Hendricks Limes, MD Primary Care Physician:  Gwendolyn Grant, MD  HPI:  Amanda Vasquez is a 44 y.o. female here as a referral from Dr. Linna Darner for bilateral arm pain. She has some shooting pain from the right side of the neck to the hand which started in November and has worsened. Symptoms affect all 5 fingers. Feels like her whole hand is burning, like it is going to "shoot off", she can't make a fist, worse at night. She also has longer-standing pain symptoms on the left with left arm weakness. MRI of the cervical spine showed mild degenerative changes at C3-C4 and C5-C6 that do not lead to any nerve root impingement.  Summary:   Nerve Conduction Studies were performed on the bilateral upper extremities.  The right median APB motor nerve showed prolonged distal onset latency (7.2 ms, N<4.0). The right Median 2nd Digit sensory nerve showed prolonged distal peak latency (6.4 ms, N<3.9) and reduced amplitude (5uV, N>10) F Wave studies indicate that the right Median F wave has delayed latency(35.0, N<13ms).  The left median APB motor nerve showed prolonged distal onset latency (5.1 ms, N<4.0). The left Median 2nd Digit sensory nerve showed prolonged distal peak latency (4.4 ms, N<3.9).  F Wave studies indicate that the left Median F wave has normal latency.   Bilateral Ulnar ADM motor nerves were within normal limits. F Wave studies indicate that the bilateral Ulnar F waves have normal latencies The bilateralUlnar 5th digit sensory nerves were within normal limits. The bilateral Radial sensory nerves were within normal limits.  The right lumbrical comparison nerve showed abnormal peak latency difference (Median-Ulnar,1.0 ms, N<0.5) with a relative median delay.   The left lumbrical comparison nerve showed borderline peak latency difference (Median-Ulnar, 0.51ms, N<0.5) with a relative median delay.    EMG  needle study of selected right upper extremity muscles was performed. The following muscles were normal: Deltoid, Triceps, Pronator Teres, Opponens Pollicis, First Dorsal Interosseous.  Conclusion: This is an abnormal study. There is electrophysiologic evidence of bilateral, moderately-severe, Right > Left Carpal Tunnel Syndrome. No suggestion of polyneuropathy or radiculopathy. Clinical correlation recommended.   Sarina Ill, MD  [facility] [address] [place], [place] [address]  Phone [phone] Fax [phone]

## 2015-02-27 ENCOUNTER — Other Ambulatory Visit: Payer: 59

## 2015-03-02 ENCOUNTER — Ambulatory Visit (HOSPITAL_COMMUNITY): Payer: 59 | Attending: Internal Medicine

## 2015-03-02 ENCOUNTER — Other Ambulatory Visit: Payer: Self-pay

## 2015-03-02 DIAGNOSIS — I639 Cerebral infarction, unspecified: Secondary | ICD-10-CM | POA: Diagnosis present

## 2015-03-02 HISTORY — PX: TRANSTHORACIC ECHOCARDIOGRAM: SHX275

## 2015-03-03 ENCOUNTER — Telehealth: Payer: Self-pay | Admitting: *Deleted

## 2015-03-03 NOTE — Telephone Encounter (Signed)
Left detailed VM about normal Echo bubble study results. Told pt to call back if she has questions. Gave [facility] phone number and office hours. Told her Dr. Jaynee Eagles and I are not in the office on Friday's.

## 2015-03-06 ENCOUNTER — Ambulatory Visit
Admission: RE | Admit: 2015-03-06 | Discharge: 2015-03-06 | Disposition: A | Discharge status: Home / Self Care | Payer: 59 | Source: Ambulatory Visit | Attending: Neurology | Admitting: Neurology

## 2015-03-06 ENCOUNTER — Other Ambulatory Visit: Payer: Self-pay | Admitting: Neurology

## 2015-03-06 DIAGNOSIS — G35 Multiple sclerosis: Secondary | ICD-10-CM

## 2015-03-06 LAB — CSF CELL COUNT WITH DIFFERENTIAL
RBC Count, CSF: 2 cu mm — ABNORMAL HIGH
Tube #: 3
WBC, CSF: 3 cu mm (ref 0–5)

## 2015-03-06 LAB — GLUCOSE, CSF: Glucose, CSF: 57 mg/dL (ref 43–76)

## 2015-03-06 LAB — PROTEIN, CSF: Total Protein, CSF: 43 mg/dL (ref 15–45)

## 2015-03-06 NOTE — Discharge Instructions (Signed)

## 2015-03-06 NOTE — Progress Notes (Signed)
One SST tube of blood drawn from right hand for LP labs by Peggye Form, RN; site unremarkable.

## 2015-03-09 LAB — OLIGOCLONAL BANDS, CSF + SERM

## 2015-03-11 ENCOUNTER — Telehealth: Payer: Self-pay | Admitting: *Deleted

## 2015-03-11 LAB — MULTIPLE SCLEROSIS PANEL 2
Albumin CSF: 25 mg/dL (ref <35)
Albumin Index: 5.9 (ref <9.0)
Albumin: 4230 mg/dL (ref 3700–5410)
CNS-IgG Synthesis Rate: <0.1 mg/24hr (ref <3.3)
IgA CSF: 0.3 mg/dL (ref 0.15–0.60)
IgA MSPROF: <0.001 mg/dL (ref <0.010)
IgA Total: 154 mg/dL (ref 81–463)
IgG Total CSF: 2.8 mg/dL (ref 0.5–6.1)
IgG Total: 938 mg/dL (ref 694–1618)
IgG-Index: 0.51 (ref <0.70)
IgG: <0.01 mg/dL (ref <0.10)
IgM MSPROF: <0.001 mg/dL (ref <0.010)
IgM Total: 57 mg/dL (ref 48–271)
IgM-CSF: 0.03 mg/dL (ref <0.10)
Myelin basic protein, csf: <2 mcg/L (ref <4.1)

## 2015-03-11 NOTE — Telephone Encounter (Signed)
-----   Message from Melvenia Beam, MD sent at 03/10/2015  7:06 PM EDT ----- Please let patient know some of the labs have come back normal. We are still waiting on the MS labs which can take a few weeks. Will call as soon as the MS labs are back thanks

## 2015-03-11 NOTE — Telephone Encounter (Signed)
Patient aware of results.

## 2015-03-16 ENCOUNTER — Other Ambulatory Visit: Payer: Self-pay | Admitting: Neurology

## 2015-03-16 ENCOUNTER — Encounter: Payer: Self-pay | Admitting: *Deleted

## 2015-03-16 ENCOUNTER — Telehealth: Payer: Self-pay | Admitting: Neurology

## 2015-03-16 NOTE — Telephone Encounter (Signed)
I forgot, please ask her when you give her the LP results. LP was negative for multiple sclerosis. thanks

## 2015-03-16 NOTE — Telephone Encounter (Signed)
Pt called and states she missed a call. Will call back

## 2015-03-16 NOTE — Progress Notes (Signed)
Faxed office visit note, imagines and lab results to Nicholaus Bloom, pt pain management doctor on 03/16/15 at 5:10pm per pt request. Received fax confirmation.

## 2015-03-16 NOTE — Telephone Encounter (Signed)
Pt called and would like to talk about her MS lab results. Please call and advise [phone]

## 2015-03-16 NOTE — Telephone Encounter (Signed)
Left detailed VM per DPR form for pt to let her know lumbar puncture was negative for MS (Multiple Sclerosis). Told her I will send her records to pain management doctor and to call back w/ any questions. Gave [facility] phone number and that we are open until 5pm.

## 2015-03-16 NOTE — Telephone Encounter (Signed)
Spoke w/ pt and she is aware LP was negative for MS. She stated her pain management doctor is [facility] who wanted to do referral originally but her PCP had to do referral because of insurance reasons. She wants to speak w/ Dr. Jaynee Eagles in more detail because even though LP was negative for MS she wants to know in more detail why her bands showed abnormalities. Told her I would have Dr. Jaynee Eagles call her.

## 2015-03-17 ENCOUNTER — Other Ambulatory Visit: Payer: Self-pay | Admitting: Neurology

## 2015-03-17 DIAGNOSIS — R839 Unspecified abnormal finding in cerebrospinal fluid: Principal | ICD-10-CM

## 2015-03-17 DIAGNOSIS — D8989 Other specified disorders involving the immune mechanism, not elsewhere classified: Secondary | ICD-10-CM

## 2015-03-17 DIAGNOSIS — R838 Other abnormal findings in cerebrospinal fluid: Secondary | ICD-10-CM

## 2015-03-17 NOTE — Telephone Encounter (Signed)
Spoke to patient. OCBs in the serum are a non-specific findings and may be caused by a wide range of disorders including infection, inflammation and neoplasia. Patients with at least 2 clones in the csf and serum likely systemic only. Identical csf bands in the CSF and serum likely systemic immune activation but not of intrathecal synthesis (ocbs in the csf likely explained by passive movement from the serum). At patient's request, will screen for systemic inflammatory causes.

## 2015-03-18 ENCOUNTER — Other Ambulatory Visit (INDEPENDENT_AMBULATORY_CARE_PROVIDER_SITE_OTHER): Payer: Self-pay

## 2015-03-18 DIAGNOSIS — Z0289 Encounter for other administrative examinations: Secondary | ICD-10-CM

## 2015-03-18 DIAGNOSIS — R839 Unspecified abnormal finding in cerebrospinal fluid: Principal | ICD-10-CM

## 2015-03-18 DIAGNOSIS — D8989 Other specified disorders involving the immune mechanism, not elsewhere classified: Secondary | ICD-10-CM

## 2015-03-18 DIAGNOSIS — R838 Other abnormal findings in cerebrospinal fluid: Secondary | ICD-10-CM

## 2015-03-23 ENCOUNTER — Telehealth: Payer: Self-pay | Admitting: *Deleted

## 2015-03-23 LAB — ANA COMPREHENSIVE PANEL
Anti JO-1: <0.2 AI (ref 0.0–0.9)
Centromere Ab Screen: <0.2 AI (ref 0.0–0.9)
Chromatin Ab SerPl-aCnc: <0.2 AI (ref 0.0–0.9)
ENA RNP Ab: <0.2 AI (ref 0.0–0.9)
ENA SM Ab Ser-aCnc: <0.2 AI (ref 0.0–0.9)
ENA SSA (RO) Ab: <0.2 AI (ref 0.0–0.9)
ENA SSB (LA) Ab: <0.2 AI (ref 0.0–0.9)
Scleroderma SCL-70: <0.2 AI (ref 0.0–0.9)
dsDNA Ab: <1 IU/mL (ref 0–9)

## 2015-03-23 LAB — MULTIPLE MYELOMA PANEL, SERUM
Albumin SerPl Elph-Mcnc: 3.5 g/dL (ref 2.9–4.4)
Albumin/Glob SerPl: 1.3 (ref 0.7–1.7)
Alpha 1: 0.2 g/dL (ref 0.0–0.4)
Alpha2 Glob SerPl Elph-Mcnc: 0.8 g/dL (ref 0.4–1.0)
B-Globulin SerPl Elph-Mcnc: 1 g/dL (ref 0.7–1.3)
Gamma Glob SerPl Elph-Mcnc: 0.7 g/dL (ref 0.4–1.8)
Globulin, Total: 2.8 g/dL (ref 2.2–3.9)
IgA/Immunoglobulin A, Serum: 155 mg/dL (ref 87–352)
IgG (Immunoglobin G), Serum: 975 mg/dL (ref 700–1600)
IgM (Immunoglobulin M), Srm: 56 mg/dL (ref 26–217)
Total Protein: 6.3 g/dL (ref 6.0–8.5)

## 2015-03-23 LAB — HIV ANTIBODY (ROUTINE TESTING W REFLEX): HIV Screen 4th Generation wRfx: NONREACTIVE

## 2015-03-23 LAB — PARANEOPLASTIC PROFILE 1
Neuronal Nuc Ab (Ri), IFA: <1:10 {titer}
Neuronal Nuclear (Hu) Antibody (IB): <1:10 {titer}
Purkinje Cell (Yo) Autoantobodies- IFA: <1:10 {titer}

## 2015-03-23 LAB — ANGIOTENSIN CONVERTING ENZYME: Angio Convert Enzyme: 15 U/L (ref 14–82)

## 2015-03-23 LAB — PAN-ANCA
ANCA Proteinase 3: <3.5 U/mL (ref 0.0–3.5)
Atypical pANCA: <1:20 {titer}
C-ANCA: <1:20 {titer}
Myeloperoxidase Ab: <9 U/mL (ref 0.0–9.0)
P-ANCA: <1:20 {titer}

## 2015-03-23 LAB — B12 AND FOLATE PANEL
Folate: >20 ng/mL (ref >3.0)
Vitamin B-12: >2000 pg/mL — ABNORMAL HIGH (ref 211–946)

## 2015-03-23 LAB — HEPATITIS C ANTIBODY: Hep C Virus Ab: <0.1 s/co ratio (ref 0.0–0.9)

## 2015-03-23 LAB — RPR: RPR Ser Ql: NONREACTIVE

## 2015-03-23 LAB — GLIADIN ANTIBODIES, SERUM
Antigliadin Abs, IgA: 2 units (ref 0–19)
Gliadin IgG: 1 units (ref 0–19)

## 2015-03-23 LAB — TISSUE TRANSGLUTAMINASE, IGA: Transglutaminase IgA: <2 U/mL (ref 0–3)

## 2015-03-23 LAB — ANA: ANA Titer 1: NEGATIVE

## 2015-03-23 NOTE — Telephone Encounter (Signed)
-----   Message from Melvenia Beam, MD sent at 03/23/2015  5:08 PM EDT ----- Let patient know all her labs were normal. thanks

## 2015-03-23 NOTE — Telephone Encounter (Signed)
Spoke with pt about normal lab results. She verbalized understanding.

## 2015-03-31 ENCOUNTER — Telehealth: Payer: Self-pay | Admitting: Neurology

## 2015-03-31 NOTE — Telephone Encounter (Signed)
Ask her to make a follow up appointment with me to discuss. I need 30 minute slot or put her in the 11:15 follow up appointment so I have extra time. Thanks.

## 2015-03-31 NOTE — Telephone Encounter (Signed)
Patient called inquiring wha advise Dr Jaynee Eagles could give her at this point since all test have come back nl. Please call and advise. Patient can be reached at [phone].

## 2015-04-01 NOTE — Telephone Encounter (Signed)
Spoke w/ pt and made appt for 04/06/15 at 2 pm for check-in at 1:45pm. Pt verbalized understanding.

## 2015-04-06 ENCOUNTER — Ambulatory Visit (INDEPENDENT_AMBULATORY_CARE_PROVIDER_SITE_OTHER): Payer: 59 | Admitting: Neurology

## 2015-04-06 ENCOUNTER — Encounter: Payer: Self-pay | Admitting: Neurology

## 2015-04-06 VITALS — BP 144/90 | HR 109 | Ht 64.0 in | Wt 219.0 lb

## 2015-04-06 DIAGNOSIS — G890 Central pain syndrome: Secondary | ICD-10-CM | POA: Diagnosis not present

## 2015-04-06 DIAGNOSIS — G5601 Carpal tunnel syndrome, right upper limb: Secondary | ICD-10-CM

## 2015-04-06 DIAGNOSIS — G5602 Carpal tunnel syndrome, left upper limb: Secondary | ICD-10-CM | POA: Diagnosis not present

## 2015-04-06 DIAGNOSIS — G5603 Carpal tunnel syndrome, bilateral upper limbs: Secondary | ICD-10-CM

## 2015-04-06 NOTE — Progress Notes (Signed)
[facility]    Provider:  Dr Jaynee Eagles Referring Provider: Rowe Clack, MD Primary Care Physician:  Gwendolyn Grant, MD   CC:  Thalamic pain syndrome  HPI:  Amanda Vasquez is a 44 y.o. female here as a follow up. We have completed a thorough evaluation including images, lumbar puncture and labwork. MRi of the brain was repeated and showed T2/flair hyperintense focus in the deep white matter of the right hemisphere that extends to the left hypothalmus. Explained that thalamic pain syndrome could explain her left-sided symptoms. LP was negative for multiple sclerosis. Oligoclonal bands were seen in the serum and are a non-specific finding and may be caused by a wide range of disorders including infection, inflammation and neoplasia. Patients with at least 2 clones in the csf and serum likely systemic only. Identical csf bands in the CSF and serum likely systemic immune activation but not of intrathecal synthesis (ocbs in the csf likely explained by passive movement from the serum). At patient's request, screened for systemic inflammatory causes which came back unremarkable. MRi of the cervical spine showed very mild degenerative changes. EMG/NCS of the upper extremity showed right>left CTS which can be contributing to her symptoms. She has been referred to hand surgery.   Today she has severe pain on the left, fasciculations, knots and bumps everywhere, it hurts to extend her elbow on the right. She is hypersensitive, even water on her skin or sheets on her skin on the left side hurts. She is in excruciating pain, even hurts to have her glasses on the left. She can't feel things as well on the left. The fine motor is diminished on the left. She did trigger point injections with Dr. Hardin Negus. She has a cyst on her right foot. She had an ovarian cyst that went away. She has extreme sensitivity and when she is pushing a cart, the vibration hurts her left hand. Her balance is poor,  not what it used to be. She has had nausea and vomiting over the last few months. She has to lay in bed in the mornings, she gets very stiff even sitting around at the house. She can't sit or she has extreme pain on both sides of her body and rectal pain. She denies low back pain or shooting pain in her legs. Mother said She stutters when she is in pain and can't get her point. She gets dizzy when she stands up too quickly. If she forgets her medicine, she wakes up with excruciating pain.   EMG/NCS bilateral uppers: 02/2015 Conclusion: This is an abnormal study. There is electrophysiologic evidence of bilateral, moderately-severe, Right > Left Carpal Tunnel Syndrome. No suggestion of polyneuropathy or radiculopathy. Clinical correlation recommended.   MRi brain and cervical cord 01/2015;  IMPRESSION: This is a mildly abnormal MRI of the cervical spine showing mild degenerative changes at C3-C4 and C5-C6 that do not lead to any nerve root impingement. The spinal cord appears normal.  IMPRESSION: This MRI of the brain with and without contrast shows a T2/flair hyperintense focus in the deep white matter of the right hemisphere corresponding to the focus reported in 05/13/2010 thatt no longer shows hyperintensity on diffusion-weighted images. Additionally, there are about 5 other punctate T2/flair foci in the subcortical white matter, most of which were present on the prior MRI. These foci are nonspecific and could be due to chronic microvascular ischemic changes, migraine or cardio-emboli. Demyelination would be less likely to have this appearance. No acute findings are noted.  Echo: 02/2015: normal  Labs: ace, hiv, b12, folate, pan-anca, rpr, hep c, celiac, ana comprehensive panel, paraneoplastic panel, IFE, tsh, RF,cmp,cbc,  esr, hgba1c, lipids were unremarkable - slightly elevated hgba1c at 7 and esr 31.   LP: protein, glucose, cell count and diff, ocbs, ms profile unremarkable   Initial visit  02/02/2015: Amanda Vasquez is a 44 y.o. female here as a referral from Dr. Asa Lente for complicated symptoms. Former Therapist, sports. Sounds like she has had a long journey and has been evaluated by multiple neurologists, pain specialists and had many pain procedures all of which have failed to find an etiology for her symptoms. One of her complaints is right arm pain. She has some shooting pain from the right side of the neck to the hand which started in November and have worsened. Goes into all 5 fingers. Feels like her whole hand is burning, like it is going to "shoot off", she can't make a fist, worse at night, she gets swollen lumps in her right upper arm. She can push on the base of her neck and actually cause the pain to shoot down her arm. She also has longer-standing pain symptoms on the entire left side of the body, splits midline to the left almost exactly from head to toe, with sensitivity of skin. She has left-sided weakness. She has fatigue. She went to [facility] for MRIs and follow up in 2011 and 2012 for abnormal white matter changes. She also saw Dr. Krista Blue in 2011 and 2012. She has been evaluated by multiple neurologists for the same reasons, last 4 years ago as reported by patient. She was following at [facility] with Dr. Moshe Cipro. Has not had an MRi since 2012. No vision changes, has ringing and buzzing in her ears. Left sided issues started in 2011 when she was originally evaluated and diagnosed with a "demyelinating disorder". The left-sided symptoms are worsening. Cold makes her left-sided symptoms worse, she can't get out of bed in the winter. She is on pain medications, the gralise and oxycodone makes her symptoms better. She takes morphine ER. She is on disability due to her condition and follows in pain clinic. She has also seen Dr. Krista Blue here at [facility]. She was diagnosed with RSD in the left foot and arm at some point and saw Dr. Nelva Bush in 2012 who tried doing multiple cervical and lumbar spinal blocks which did not help.  She was also seen at [facility] and they did a spinal nerve ablation which did not work longer than a day. By 3-4pm she can't even hold up her head anymore, she has to prop it up against something because of tightness. She tries to get out more now since it is warmer, it takes her 45 minutes to get up and out of the bed in the mornings. In the cold weather she is bed ridden. She was adopted and she does not have infomration about birth family.   Reviewed notes, labs and imaging from outside physicians, which showed: MRI of the brain dated June 2012 showed a single nonenhancing 5 x 3 mm focus of increased T2 FLAIR signal intensity within the sub-occipital white matter of the right parietal lobe. No enhancing lesions.MRI of the cervical spine showed no disc protrusion, spinal canal or neuroforaminal stenosis at any level. Both were done with and without contrast.  She sees Dr. Unice Cobble who is her primary care. He also notes this reported "demyelinating syndrome" per patient. She was seen at [facility] by  Dr. Moshe Cipro and [facility] by Dr. Benjamin Stain. She was last seen at [facility] in July 2013 and apparently she became upset when MS treatments were not pursued. A hand surgeon diagnosed reflex sympathetic dystrophy . She follows in pain clinic.  Review of Systems: Patient complains of symptoms per HPI as well as the following symptoms: chills, fatigue, eye discharge, eye itching, light sensitivity, blurred vision, ringing in ears, cold intolerance, nausea, vomiting, rectal pain, joint pain, aching muscles, muscle cramps, walking difficulty, neck pain, neck stiffness, memory loss, dizziness, speech difficulty, weakness, nervouse/anxious. Pertinent negatives per HPI. All others negative.   Social History   Social History  . Marital Status: Divorced    Spouse Name: N/A  . Number of Children: 0  . Years of Education: 12+   Occupational History  . RN- long-term disability     Social  History Main Topics  . Smoking status: Former Smoker    Types: Cigarettes, Cigars    Quit date: 08/22/2008  . Smokeless tobacco: Not on file     Comment: Divorced, lives with boyfriend  . Alcohol Use: No     Comment: Quit drinking in 2010  . Drug Use: No  . Sexual Activity: Not on file   Other Topics Concern  . Not on file   Social History Narrative   Lives at home with herself.   Caffeine use: 1 cup coffee 3 times per week    Family History  Problem Relation Age of Onset  . Adopted: Yes    Past Medical History  Diagnosis Date  . DIABETES MELLITUS, TYPE II   . ALLERGIC RHINITIS   . ANXIETY   . PARESTHESIA   . OBESITY   . AMENORRHEA   . DISORDER, BIPOLAR, ATYPICAL MANIC   . DYSLIPIDEMIA   . Thalamic pain syndrome (hyperesthetic)     follows with Dr Hardin Negus for same, on disability since 09/2011 for same  . Migraine   . Demyelinating disorder   . Central pain syndrome     Past Surgical History  Procedure Laterality Date  . Breast surgery  1997, 2006    Reduction  . Cholecystectomy  1998  . Intrauterine device insertion  2009  . Knee surgery Right 1993    Current Outpatient Prescriptions  Medication Sig Dispense Refill  . ALPRAZolam (XANAX) 0.5 MG tablet Take 0.5 mg by mouth 3 (three) times daily.     [name] atorvastatin (LIPITOR) 10 MG tablet TAKE ONE TABLET EVERY DAY 90 tablet 1  . b complex vitamins tablet Take 1 tablet by mouth daily.      . baclofen (LIORESAL) 10 MG tablet Take 10 mg by mouth daily.    [name] buPROPion (BUDEPRION XL) 300 MG 24 hr tablet 300 mg. Take 300 mg by mouth daily.    . Cetirizine HCl 10 MG CAPS Take by mouth.    . Cholecalciferol (VITAMIN D3) 2000 UNITS TABS Take 500 Units by mouth daily.     . Exenatide ER (BYDUREON) 2 MG SUSR Inject 2 mg into the skin once a week. 4 each 6  . Gabapentin Enacarbil 600 MG TB24 Take 600 mg by mouth at bedtime. (Patient taking differently: Take 1,800 mg by mouth at bedtime. ) 30 tablet   . glipiZIDE  (GLUCOTROL) 5 MG tablet Take 1 tablet (5 mg total) by mouth daily. 90 tablet 3  . glucose blood test strip Use as instructed 100 each 12  . GRALISE 600 MG TABS Take 1,800 mg by mouth  daily.    . metFORMIN (GLUCOPHAGE) 1000 MG tablet Take 1 tablet (1,000 mg total) by mouth 2 (two) times daily with a meal. 180 tablet 0  . morphine (MS CONTIN) 60 MG 12 hr tablet Take 60 mg by mouth every 12 (twelve) hours.    . multivitamin-iron-minerals-folic acid (CENTRUM) chewable tablet Chew 1 tablet by mouth daily.    [name] NEEDLE, DISP, 25 G (BD ECLIPSE) 25G X 1-1/2" MISC Use to administer vitamin b12 monthly 12 each 0  . ondansetron (ZOFRAN) 4 MG tablet Take as needed for nausea    . oxyCODONE (OXY IR/ROXICODONE) 5 MG immediate release tablet 5 mg. Take 5 mg by mouth every 4 (four) hours as needed for Pain.    . polyethylene glycol (MIRALAX / GLYCOLAX) packet Take 17 g by mouth daily as needed.    . traZODone (DESYREL) 150 MG tablet Take 150 mg by mouth as needed.    . valACYclovir (VALTREX) 1000 MG tablet TAKE 1 TABLET EVERY DAY AS NEEDED 60 tablet 0  . Wheat Dextrin (BENEFIBER PO) Take by mouth as needed.     No current facility-administered medications for this visit.    Allergies as of 04/06/2015 - Review Complete 04/06/2015  Allergen Reaction Noted  . Latex  07/03/2008  . Prednisone  06/22/2011    Vitals: BP 144/90 mmHg  Pulse 109  Ht _0  (1.626 m)  Wt 219 lb (99.338 kg)  BMI 37.57 kg/m2 Last Weight:  Wt Readings from Last 1 Encounters:  04/06/15 219 lb (99.338 kg)   Last Height:   Ht Readings from Last 1 Encounters:  04/06/15 _1  (1.626 m)       Physical exam: Exam: Gen: NAD, conversant, well nourised, obese, well groomed  CV: RRR, no MRG. No Carotid Bruits. No peripheral edema, warm, nontender Eyes: Conjunctivae clear without exudates or hemorrhage  Neuro: Detailed Neurologic Exam  Speech:  Speech is normal; fluent and spontaneous with normal  comprehension.  Cognition:  The patient is oriented to person, place, and time;   recent and remote memory intact;   language fluent;   normal attention, concentration,   fund of knowledge Cranial Nerves:  The pupils are equal, round, and reactive to light. Extraocular movements are intact. Trigeminal sensation is intact and the muscles of mastication are normal. The face is symmetric. The palate elevates in the midline. Hearing intact. Voice is normal. Shoulder shrug is normal. The tongue has normal motion without fasciculations.   Motor Observation:  No asymmetry, no atrophy, and no involuntary movements noted. Tone:  Normal muscle tone.   Posture:  Posture is normal. normal erect   Strength:  Strength is V/V in the upper and lower limbs.    Sensation: Splits midline from the face to the thorax/abd/pelvis with hyperalgesia and dysesthesias on the left including left arm and leg.       Assessment/Plan: 44 year old patient here with multiple complaints. She has a complicated past medical history which includes patient reported "demyelinating disorder"and has followed-up with multiple neurologists including at [facility] but never treated. She is on disability and is practically bedridden in the winter months due to dysesthesias and hyperalgesia of the left side of her body. She has right sided neck pain with radicular symptoms into the right arm. She has chronic fatigue and chronic pain syndrome. At one point there was apparently a mention of reflex sympathetic dystrophy I don't see any indication of that on exam. In fact her exam was surprisingly unremarkable  except for the left sided sensory problems as above. Strength appears to be intact.     Amanda Vasquez is a 44 y.o. female here as a follow up. We have completed a thorough evaluation including images, lumbar puncture and labwork. MRi of the brain was repeated and showed T2/flair  hyperintense focus in the deep white matter of the right hemisphere that extends to the left hypothalmus. Explained that thalamic pain syndrome could explain her left-sided symptoms. LP was negative for multiple sclerosis. Oligoclonal bands were seen in the serum and are a non-specific finding and may be caused by a wide range of disorders including infection, inflammation and neoplasia. Patients with at least 2 clones in the csf and serum likely systemic only. Identical csf bands in the CSF and serum likely systemic immune activation but not of intrathecal synthesis (ocbs in the csf likely explained by passive movement from the serum). At patient's request, screened for systemic inflammatory causes which came back unremarkable. MRi of the cervical spine showed very mild degenerative changes. EMG/NCS of the upper extremity showed right>left CTS which can be contributing to her symptoms. She has been referred to hand surgery.  Discussed at length with patient. At this point she should f/u with hand surgery and her pain managagement specialist and continue to followup with her therapist.     Sarina Ill, MD  [facility] [address] [place], [place] [address]  Phone [phone] Fax [phone]  A total of 50 minutes was spent face-to-face with this patient. Over half this time was spent on counseling patient on the thalamic pain syndrome diagnosis and different diagnostic and therapeutic options available.

## 2015-04-06 NOTE — Patient Instructions (Signed)
Overall you are doing fairly well but I do want to suggest a few things today:   Remember to drink plenty of fluid, eat healthy meals and do not skip any meals. Try to eat protein with a every meal and eat a healthy snack such as fruit or nuts in between meals. Try to keep a regular sleep-wake schedule and try to exercise daily, particularly in the form of walking, 20-30 minutes a day, if you can.   As far as your medications are concerned, I would like to suggest: Continue current medications  I would like to see you back in 4 months, sooner if we need to. Please call us with any interim questions, concerns, problems, updates or refill requests.   Please also call us for any test results so we can go over those with you on the phone.  My clinical assistant and will answer any of your questions and relay your messages to me and also relay most of my messages to you.   Our phone number is [phone]. We also have an after hours call service for urgent matters and there is a physician on-call for urgent questions. For any emergencies you know to call 911 or go to the nearest emergency room

## 2015-04-08 DIAGNOSIS — G890 Central pain syndrome: Secondary | ICD-10-CM | POA: Insufficient documentation

## 2015-04-16 ENCOUNTER — Other Ambulatory Visit: Payer: Self-pay | Admitting: Internal Medicine

## 2015-05-26 ENCOUNTER — Ambulatory Visit: Payer: Self-pay | Admitting: Psychiatry

## 2015-06-01 ENCOUNTER — Ambulatory Visit: Payer: 59 | Admitting: Internal Medicine

## 2015-06-09 ENCOUNTER — Encounter: Payer: Self-pay | Admitting: Psychiatry

## 2015-06-09 ENCOUNTER — Ambulatory Visit (INDEPENDENT_AMBULATORY_CARE_PROVIDER_SITE_OTHER): Payer: 59 | Admitting: Psychiatry

## 2015-06-09 VITALS — BP 124/82 | HR 104 | Temp 97.5°F | Ht 65.0 in | Wt 224.6 lb

## 2015-06-09 DIAGNOSIS — F329 Major depressive disorder, single episode, unspecified: Secondary | ICD-10-CM

## 2015-06-09 DIAGNOSIS — F411 Generalized anxiety disorder: Secondary | ICD-10-CM

## 2015-06-09 DIAGNOSIS — F32A Depression, unspecified: Secondary | ICD-10-CM

## 2015-06-09 MED ORDER — TRAZODONE HCL 100 MG PO TABS: 100.0000 mg | ORAL_TABLET | Freq: Every evening | ORAL | Status: DC | PRN

## 2015-06-09 MED ORDER — BUPROPION HCL ER (XL) 300 MG PO TB24: 300.0000 mg | ORAL_TABLET | Freq: Every day | ORAL | Status: DC

## 2015-06-09 MED ORDER — ALPRAZOLAM 0.5 MG PO TABS: 0.5000 mg | ORAL_TABLET | Freq: Three times a day (TID) | ORAL | Status: DC | PRN

## 2015-06-09 NOTE — Progress Notes (Signed)
Psychiatric Initial Adult Assessment   Patient Identification: Amanda Vasquez MRN:  [id number] Date of Evaluation:  06/09/2015 Referral Source: Psychologist Chief Complaint:   "I had been seeing Dr. Candis Schatz." "A lot of anxiety." Visit Diagnosis:    ICD-9-CM ICD-10-CM   1. Generalized anxiety disorder 300.02 F41.1   2. Depression 311 F32.9    Diagnosis:   Patient Active Problem List   Diagnosis Date Noted  . 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 CTS (carpal tunnel syndrome) [G56.00] 02/25/2015  . Weakness of left side of body [M62.89] 02/02/2015  . Weakness of right arm [R29.898] 02/02/2015  . Paresthesias [R20.2] 02/02/2015  . Demyelinating nervous system disease or syndrome ([place]) [G37.9] 01/20/2015  . Cough [R05] 12/22/2014  . Acute bronchitis [J20.9] 11/28/2014  . Ganglion cyst of left foot [M67.472] 06/12/2014  . Chronic ankle pain [M25.579, G89.29] 06/12/2014  . Plantar fasciitis of left foot [M72.2] 07/12/2013  . 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 Hyperlipidemia [E78.5] 05/28/2010  . OBESITY [E66.9] 02/26/2010  . Herpes simplex virus (HSV) infection [B00.9] 12/21/2009  . ALLERGIC RHINITIS [J30.9] 12/21/2009  . BACK PAIN, THORACIC REGION [M54.6] 11/11/2009  . Diabetes mellitus type 2 in obese ([place]) [E11.9, E66.9] 07/28/2008  . ANXIETY [F41.1] 07/03/2008  . AMENORRHEA [N91.2] 12/17/2007  . DISORDER, BIPOLAR, ATYPICAL MANIC [F30.8] 05/14/2007   History of Present Illness:  Patient indicates that in 2011 she had and "major life change." She states at that time she had developed some lesions on her brain. She states ultimately she has been given the diagnoses of central pain syndrome and reflex sympathetic dystrophy. She states she's been on disability related to these issues.  She states she's had issues with depression and anxiety. She states that her depression is typically "situational." She states she rarely has never gone days without  caring about herself or showering. She states she does have fatigue but seems to relate this is related to some of her physical issues. She states her memory and concentration have been impaired. She denies ever developing suicidal ideation or any past suicide attempts. She denies any symptoms consistent with mania.  In regards to anxiety she states it's more a constant thought of asking herself questions such as how will she finish something, how will she get through something or will she make it through an episode of her pain. She states she's typically able to self soothing and rationalize that nothing is going to happen and ultimately, self down.  She has been seeing Dr. Candis Schatz since 2010 or 2011. She states she's been maintained on her current medications of trazodone 150 mg at bedtime although she states she typically was breaking the 150 mg tablet in half really taking 75 mg. She's also been taking Wellbutrin XL 300 mg in the morning and alprazolam 0.5 mg 3 times a day. She states that these medications are generally been helpful. Elements:  Duration:  As noted above. Associated Signs/Symptoms: Depression Symptoms:  depressed mood, fatigue, difficulty concentrating, anxiety, (Hypo) Manic Symptoms:  None Anxiety Symptoms:  Excessive Worry, Psychotic Symptoms:  None PTSD Symptoms: Negative Patient denies any psychiatric hospitalizations. She denies any suicide attempts. Past Medical History:  Past Medical History  Diagnosis Date  . DIABETES MELLITUS, TYPE II   . ALLERGIC RHINITIS   . ANXIETY   . PARESTHESIA   . OBESITY   . AMENORRHEA   . DISORDER, BIPOLAR, ATYPICAL MANIC   . DYSLIPIDEMIA   . Thalamic pain syndrome (hyperesthetic)  follows with Dr Hardin Negus for same, on disability since 09/2011 for same  . Migraine   . Demyelinating disorder   . Central pain syndrome     Past Surgical History  Procedure Laterality Date  . Breast surgery  1997, 2006    Reduction  .  Cholecystectomy  1998  . Intrauterine device insertion  2009  . Knee surgery Right 1993   Family History:  Family History  Problem Relation Age of Onset  . Adopted: Yes   Social History:   Social History   Social History  . Marital Status: Divorced    Spouse Name: N/A  . Number of Children: 0  . Years of Education: 12+   Occupational History  . RN- long-term disability     Social History Main Topics  . Smoking status: Former Smoker    Types: Cigarettes, Cigars    Quit date: 08/22/2008  . Smokeless tobacco: Not on file     Comment: Divorced, lives with boyfriend  . Alcohol Use: No     Comment: Quit drinking in 2010  . Drug Use: No  . Sexual Activity: Not on file   Other Topics Concern  . Not on file   Social History Narrative   Lives at home with herself.   Caffeine use: 1 cup coffee 3 times per week   Additional Social History: Patient states that she was adopted at age 69. She states that her adoptive parents are great people have been supportive of her. She states that her adopted parents produce one brother and one sister. Patient relates she does not have any children. She states she was married in her 4s for 3 years. She states she does have a boyfriend but states that the relationship is somewhat deteriorated but they do socialize with each other.  She states she has good social supports and that in 2014 she moved next door to her doctor parents. She states she has a good relationship with her brother and sister. She indicates that she visits the brother who is in [place] and finds this relaxing. She states she also will see her sister down in [place].  Patient has gone college and ultimately went to nursing school. 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  Due to her adoptive status she is not clear on family history of  psychiatric illness however she states that she did ask social services for some medical information for any biological relatives and she was told that on her maternal side there really of people getting treated for "nervous condition. " She surmises that her parents may have had issues with addiction because she is aware that her mother died at age 4.    Musculoskeletal: Strength & Muscle Tone: within normal limits Gait & Station: normal Patient leans: N/A  Psychiatric Specialty Exam: HPI  Review of Systems  Psychiatric/Behavioral: Positive for memory loss. Negative for depression, suicidal ideas, hallucinations and substance abuse. The patient is nervous/anxious and has insomnia.   All other systems reviewed and are negative.   There were no vitals taken for this visit.There is no weight on file to calculate BMI.  General Appearance: Fairly Groomed  Eye Contact:  Good  Speech:  Normal Rate  Volume:  Normal  Mood:  Good  Affect:  Congruent  Thought Process:  Linear and Logical  Orientation:  Full (Time, Place, and Person)  Thought Content:  Negative  Suicidal Thoughts:  No  Homicidal Thoughts:  No  Memory:  Immediate;   Good Recent;   Good Remote;   Good  Judgement:  Good  Insight:  Good  Psychomotor Activity:  Negative  Concentration:  Good  Recall:  Good  Fund of Knowledge:Good  Language: Good  Akathisia:  Negative  Handed:    AIMS (if indicated):    Assets:  Communication Skills Desire for Improvement Social Support Vocational/Educational  ADL's:  Intact  Cognition: WNL  Sleep:  good   Is the patient at risk to self?  No. Has the patient been a risk to self in the past 6 months?  No. Has the patient been a risk to self within the distant past?  No. Is the patient a risk to others?  No. Has the patient been a risk to others in the past 6 months?  No. Has the patient been a risk to others within the distant past?  No.  Allergies:   Allergies  Allergen  Reactions  . Latex   . Prednisone    Current Medications: Current Outpatient Prescriptions  Medication Sig Dispense Refill  . ALPRAZolam (XANAX) 0.5 MG tablet Take 1 tablet (0.5 mg total) by mouth 3 (three) times daily as needed for anxiety. 90 tablet 1  . atorvastatin (LIPITOR) 10 MG tablet TAKE ONE TABLET EVERY DAY 90 tablet 1  . b complex vitamins tablet Take 1 tablet by mouth daily.      . baclofen (LIORESAL) 10 MG tablet Take 10 mg by mouth daily.    [name] buPROPion (BUDEPRION XL) 300 MG 24 hr tablet Take 1 tablet (300 mg total) by mouth daily. Take 300 mg by mouth daily. 30 tablet 1  . Cetirizine HCl 10 MG CAPS Take by mouth.    . Cholecalciferol (VITAMIN D3) 2000 UNITS TABS Take 500 Units by mouth daily.     . Exenatide ER (BYDUREON) 2 MG SUSR Inject 2 mg into the skin once a week. 4 each 6  . Gabapentin Enacarbil 600 MG TB24 Take 600 mg by mouth at bedtime. (Patient taking differently: Take 1,800 mg by mouth at bedtime. ) 30 tablet   . glipiZIDE (GLUCOTROL) 5 MG tablet Take 1 tablet (5 mg total) by mouth daily. 90 tablet 3  . glucose blood test strip Use as instructed 100 each 12  . GRALISE 600 MG TABS Take 1,800 mg by mouth daily.    . metFORMIN (GLUCOPHAGE) 1000 MG tablet Take 1 tablet (1,000 mg total) by mouth 2 (two) times daily with a meal. 180 tablet 0  . morphine (MS CONTIN) 60 MG 12 hr tablet Take 60 mg by mouth every 12 (twelve) hours.    . multivitamin-iron-minerals-folic acid (CENTRUM) chewable tablet Chew 1 tablet by mouth daily.    [name] NEEDLE, DISP, 25 G (BD ECLIPSE) 25G X 1-1/2" MISC Use to administer vitamin b12 monthly 12 each 0  . ondansetron (ZOFRAN) 4 MG tablet Take as needed for nausea    . oxyCODONE (OXY IR/ROXICODONE) 5 MG immediate release tablet 5 mg. Take 5 mg by mouth every 4 (four) hours as needed for Pain.    . polyethylene glycol (MIRALAX / GLYCOLAX) packet Take 17 g by mouth daily as needed.    . traZODone (DESYREL) 100 MG tablet Take 1 tablet (100 mg total)  by mouth at bedtime as needed for sleep (make take one and a half tablet if needed.).  30 tablet 1  . valACYclovir (VALTREX) 1000 MG tablet TAKE 1 TABLET EVERY DAY AS NEEDED 60 tablet 0  . Wheat Dextrin (BENEFIBER PO) Take by mouth as needed.     No current facility-administered medications for this visit.    Previous Psychotropic Medications: Yes  Patient states that she was on some Prozac when she was in her 56s. She states she was treated for some depression and relates that this was due to her husband at that time abusing alcohol. She states she felt like the Prozac helped her. However she also states the Prozac may have cause weight gain. Substance Abuse History in the last 12 months:  No.  Consequences of Substance Abuse: NA  Medical Decision Making:  Established Problem, Stable/Improving (1), Review of Medication Regimen & Side Effects (2) and Review of New Medication or Change in Dosage (2)  Treatment Plan Summary: Medication management and Plan Other depressive disorder- patient denies multiple days or weeks of depression as such there is no clear two-week period of depression. She states that oftentimes it is situational. She has been stable on her Wellbutrin XL and thus we'll continue her Wellbutrin XL 300 mg daily. Patient feels comfortable on her current medication regimen. However other option should she feel her depression is not well-controlled be simply to increase her Wellbutrin, augment or potentially switch to an antidepressant which can also address anxiety. Discussed these options with patient however at this time we'll continue with her current regimen.  Generalized anxiety disorder-patient reports anxiety around worry about is she going to get through something, her pain resolving. She has been stable on her alprazolam. Will continue alprazolam 0.5 mg 3 times a day as needed.  Insomnia-patient was written for trazodone 150 mg tablets. However she states she was breaking  them in half and taking 75. She states she would like to try a slight increase of 100 mg and thus we'll gonorrhea write her for trazodone 100 mg tablets instruction to take 100 mg at bedtime as needed and if needed she can take 150 mg.  In regards to risk assessment the patient has affective illness, anxiety and race as risk factors. She has protective factors of no past suicide attempts, no major depressive disorder, forward thinking, good social supports, gender and no past suicide attempts. At this time low risk of imminent harm to self or others.  Patient will follow up in 1 month. She's been encouraged colony questions concerns prior to her next point.    Faith Rogue 10/18/20162:00 PM

## 2015-06-16 LAB — HM DIABETES EYE EXAM

## 2015-06-18 ENCOUNTER — Ambulatory Visit: Payer: 59 | Admitting: Internal Medicine

## 2015-06-18 ENCOUNTER — Telehealth: Payer: Self-pay | Admitting: Internal Medicine

## 2015-06-18 NOTE — Telephone Encounter (Signed)
Patient is needing a compass referral to [facility] - DR. [employer].  Office will not make appointment until they get a referral.  Phone number to reach is [phone].  She was referred to [facility] by DR. Chen at [facility] in [place] Ph [phone].

## 2015-06-23 ENCOUNTER — Encounter: Payer: Self-pay | Admitting: Internal Medicine

## 2015-06-23 NOTE — Telephone Encounter (Signed)
Please call patient back in regards  °

## 2015-06-23 NOTE — Telephone Encounter (Signed)
Patient is calling back in regards. Needs appointment as soon as possible for eyelid evaluation.

## 2015-06-24 MED ORDER — EXENATIDE ER 2 MG ~~LOC~~ PEN: 2.0000 mg | PEN_INJECTOR | SUBCUTANEOUS | Status: DC

## 2015-06-24 NOTE — Telephone Encounter (Signed)
Referral faxed to [facility].

## 2015-06-24 NOTE — Telephone Encounter (Signed)
Pt is aware.  

## 2015-07-07 ENCOUNTER — Ambulatory Visit: Payer: Self-pay | Admitting: Psychiatry

## 2015-07-08 ENCOUNTER — Encounter: Payer: Self-pay | Admitting: Psychiatry

## 2015-07-08 ENCOUNTER — Ambulatory Visit (INDEPENDENT_AMBULATORY_CARE_PROVIDER_SITE_OTHER): Payer: 59 | Admitting: Psychiatry

## 2015-07-08 VITALS — BP 128/70 | HR 118 | Temp 96.1°F | Ht 65.0 in | Wt 225.2 lb

## 2015-07-08 DIAGNOSIS — F32A Depression, unspecified: Secondary | ICD-10-CM

## 2015-07-08 DIAGNOSIS — F329 Major depressive disorder, single episode, unspecified: Secondary | ICD-10-CM | POA: Diagnosis not present

## 2015-07-08 DIAGNOSIS — F411 Generalized anxiety disorder: Secondary | ICD-10-CM

## 2015-07-08 MED ORDER — ESCITALOPRAM OXALATE 10 MG PO TABS: ORAL_TABLET | ORAL | Status: DC

## 2015-07-08 NOTE — Progress Notes (Signed)
BH MD/PA/NP OP Progress Note  07/08/2015 3:07 PM Amanda Vasquez  MRN:  [id number]  Subjective:  Patient presents for follow-up of her generalized anxiety disorder, other specified depressive disorder and insomnia. She states overall that one of her big issue she's for his simply anxiety and losing track of time. She did state that the slight increase in her trazodone has helped her insomnia and she now sleeps about 9 hours a night although it can take her 2 hours to fall asleep.  She states that she had increased anxiety and fears about her safety after the election last week. She states overall she feels like her anxiety may be getting in the way of her accomplishing things. She feels like that is her biggest problem at this time. Chief Complaint: Anxiety Chief Complaint    Medication Refill; Follow-up; Anxiety; Depression     Visit Diagnosis:     ICD-9-CM ICD-10-CM   1. Generalized anxiety disorder 300.02 F41.1   2. Depression 311 F32.9     Past Medical History:  Past Medical History  Diagnosis Date  . DIABETES MELLITUS, TYPE II   . ALLERGIC RHINITIS   . ANXIETY   . PARESTHESIA   . OBESITY   . AMENORRHEA   . DISORDER, BIPOLAR, ATYPICAL MANIC   . DYSLIPIDEMIA   . Thalamic pain syndrome (hyperesthetic)     follows with Dr Hardin Negus for same, on disability since 09/2011 for same  . Migraine   . Demyelinating disorder ([place])   . Central pain syndrome   . Diabetes mellitus, type II [facility])     Past Surgical History  Procedure Laterality Date  . Breast surgery  1997, 2006    Reduction  . Cholecystectomy  1998  . Intrauterine device insertion  2009  . Knee surgery Right 1993   Family History:  Family History  Problem Relation Age of Onset  . Adopted: Yes   Social History:  Social History   Social History  . Marital Status: Divorced    Spouse Name: N/A  . Number of Children: 0  . Years of Education: 12+   Occupational History  . RN- long-term disability     Social  History Main Topics  . Smoking status: Former Smoker    Types: Cigarettes, Cigars    Start date: 06/08/1986    Quit date: 08/22/2008  . Smokeless tobacco: Never Used     Comment: Divorced, lives with boyfriend  . Alcohol Use: No     Comment: Quit drinking in 2010  . Drug Use: No  . Sexual Activity: Yes    Birth Control/ Protection: IUD   Other Topics Concern  . None   Social History Narrative   Lives at home with herself.   Caffeine use: 1 cup coffee 3 times per week   Additional History:   Assessment:   Musculoskeletal: Strength & Muscle Tone: within normal limits Gait & Station: normal Patient leans: N/A  Psychiatric Specialty Exam: HPI  Review of Systems  Psychiatric/Behavioral: Negative for depression, suicidal ideas, hallucinations, memory loss and substance abuse. The patient is nervous/anxious and has insomnia (Sleep onset, however she is able to sleep once she falls asleep.).   All other systems reviewed and are negative.   Blood pressure 128/70, pulse 118, temperature 96.1 F (35.6 C), temperature source Tympanic, height 5\' 5"  (1.651 m), weight 225 lb 3.2 oz (102.15 kg), last menstrual period 06/24/2015, SpO2 93 %.Body mass index is 37.48 kg/(m^2).  General Appearance: Well Groomed  Eye  Contact:  Good  Speech:  Normal Rate  Volume:  Normal  Mood:  Okay  Affect:  Constricted  Thought Process:  Linear  Orientation:  Full (Time, Place, and Person)  Thought Content:  Negative  Suicidal Thoughts:  No  Homicidal Thoughts:  No  Memory:  Immediate;   Good Recent;   Good Remote;   Good  Judgement:  Good  Insight:  Good  Psychomotor Activity:  Negative  Concentration:  Good  Recall:  Good  Fund of Knowledge: Good  Language: Good  Akathisia:  Negative  Handed:    AIMS (if indicated):    Assets:  Communication Skills Desire for Improvement  ADL's:  Intact  Cognition: WNL  Sleep:  Poor sleep onset but good otherwise    Is the patient at risk to self?   No. Has the patient been a risk to self in the past 6 months?  No. Has the patient been a risk to self within the distant past?  No. Is the patient a risk to others?  No. Has the patient been a risk to others in the past 6 months?  No. Has the patient been a risk to others within the distant past?  No.  Current Medications: Current Outpatient Prescriptions  Medication Sig Dispense Refill  . ALPRAZolam (XANAX) 0.5 MG tablet Take 1 tablet (0.5 mg total) by mouth 3 (three) times daily as needed for anxiety. 90 tablet 1  . aspirin 81 MG tablet Take 81 mg by mouth daily.    [name] atorvastatin (LIPITOR) 10 MG tablet TAKE ONE TABLET EVERY DAY 90 tablet 1  . b complex vitamins tablet Take 1 tablet by mouth daily.      . baclofen (LIORESAL) 10 MG tablet Take 10 mg by mouth daily.    [name] buPROPion (BUDEPRION XL) 300 MG 24 hr tablet Take 1 tablet (300 mg total) by mouth daily. Take 300 mg by mouth daily. 30 tablet 1  . Cetirizine HCl 10 MG CAPS Take by mouth.    . Cholecalciferol (VITAMIN D3) 2000 UNITS TABS Take 500 Units by mouth daily.     . Exenatide ER 2 MG PEN Inject 2 mg into the skin once a week. 3 each 4  . Gabapentin Enacarbil 600 MG TB24 Take 600 mg by mouth at bedtime. (Patient taking differently: Take 1,800 mg by mouth at bedtime. ) 30 tablet   . glipiZIDE (GLUCOTROL) 5 MG tablet Take 1 tablet (5 mg total) by mouth daily. 90 tablet 3  . glucose blood test strip Use as instructed 100 each 12  . GRALISE 600 MG TABS Take 1,800 mg by mouth daily.    . metFORMIN (GLUCOPHAGE) 1000 MG tablet Take 1 tablet (1,000 mg total) by mouth 2 (two) times daily with a meal. 180 tablet 0  . morphine (MS CONTIN) 60 MG 12 hr tablet Take 60 mg by mouth every 12 (twelve) hours.    . multivitamin-iron-minerals-folic acid (CENTRUM) chewable tablet Chew 1 tablet by mouth daily.    [name] NEEDLE, DISP, 25 G (BD ECLIPSE) 25G X 1-1/2" MISC Use to administer vitamin b12 monthly 12 each 0  . ondansetron (ZOFRAN) 4 MG tablet Take  as needed for nausea    . oxyCODONE (OXY IR/ROXICODONE) 5 MG immediate release tablet 5 mg. Take 5 mg by mouth every 4 (four) hours as needed for Pain.    . polyethylene glycol (MIRALAX / GLYCOLAX) packet Take 17 g by mouth daily as needed.    [name]  traZODone (DESYREL) 100 MG tablet Take 1 tablet (100 mg total) by mouth at bedtime as needed for sleep (make take one and a half tablet if needed.). 30 tablet 1  . valACYclovir (VALTREX) 1000 MG tablet TAKE 1 TABLET EVERY DAY AS NEEDED 60 tablet 0  . Wheat Dextrin (BENEFIBER PO) Take by mouth as needed.    [name] escitalopram (LEXAPRO) 10 MG tablet Take one half a tablet in the morning for seven days the increase to one whole tablet in the morning. 30 tablet 1   No current facility-administered medications for this visit.    Medical Decision Making:  Established Problem, Stable/Improving (1), Review of Medication Regimen & Side Effects (2) and Review of New Medication or Change in Dosage (2)  Treatment Plan Summary:Medication management and Plan  Medication management and Plan Other Specified depressive disorder- it appears that her depression is relatively stable at this time. Thus we'll continue her Wellbutrin XL at 300 mg daily.  Generalized anxiety disorder-patient reports anxiety around worry about is she going to get through something, her pain resolving. She has been stable on her alprazolam. Will continue alprazolam 0.5 mg 3 times a day as needed. She relates that anxiety is her biggest issue right now and thus we are going to start some Lexapro to address anxiety as well as perhaps augment the Wellbutrin for depression. Will start Lexapro 5 mg in the morning for 7 days and then she'll go to 10 mg in the morning. Risk and benefits of been discussed patient's able consent.  Insomnia-continue  trazodone 100 mg at bedtime. Patient reports good benefit however sleep onset tends to be a problem, ever once asleep she states she can sleep 9 hours.   Patient  will follow up in 1 month. She's been encouraged colony questions concerns prior to her next point.  Faith Rogue 07/08/2015, 3:07 PM

## 2015-07-10 ENCOUNTER — Encounter: Payer: Self-pay | Admitting: Internal Medicine

## 2015-07-21 ENCOUNTER — Other Ambulatory Visit: Payer: Self-pay | Admitting: Internal Medicine

## 2015-07-22 ENCOUNTER — Telehealth: Payer: Self-pay | Admitting: Neurology

## 2015-07-22 NOTE — Telephone Encounter (Signed)
Pt called to confirm [facility] rec'd fax from Dr Ainsley Spinner office. If notes have been rec'd no need to call her.

## 2015-07-23 NOTE — Telephone Encounter (Signed)
Called pt back. Dr Carlis Abbott noticed spasms from forehead, eye down to lip during OV. Wanted to maybe consider botox. He only does cosmetic and wanted to refer back to Dr Jaynee Eagles to consider this. Pt has upcoming appt on 12/14. Pt would like to discuss this w/ Dr Jaynee Eagles at this time and wanted to make sure she had notes from Dr Carlis Abbott.

## 2015-07-23 NOTE — Telephone Encounter (Signed)
If she has hemifacial spasms she should have a consult with Dr. Rexene Alberts. Does she want to see me first or go directly to Dr. Rexene Alberts?

## 2015-07-23 NOTE — Telephone Encounter (Signed)
Thanks, did she want a return call or anything? I was not expecting any documents for her. Was there a reason? thanks

## 2015-07-23 NOTE — Telephone Encounter (Signed)
Called pt. She would like to still see Dr Jaynee Eagles on 12/14 and make appt w/ Dr Rexene Alberts. Made appt w/ Dr Rexene Alberts on 12/19 at 3pm. She verbalized understanding and appreciation.

## 2015-07-23 NOTE — Telephone Encounter (Signed)
Dr ahern-Received fax from [facility]. Will put w/ notes from today.

## 2015-07-23 NOTE — Telephone Encounter (Signed)
Patient returned call, advised fax not received yet, they may need to fax again, patient has fax number, will advise to re-fax.

## 2015-07-23 NOTE — Telephone Encounter (Signed)
LVM returning pt call. Inform her I have not received fax yet. They may need to fax again. Gave [facility] phone number.

## 2015-08-03 ENCOUNTER — Encounter: Payer: Self-pay | Admitting: Family

## 2015-08-03 ENCOUNTER — Ambulatory Visit (INDEPENDENT_AMBULATORY_CARE_PROVIDER_SITE_OTHER): Payer: 59 | Admitting: Family

## 2015-08-03 ENCOUNTER — Ambulatory Visit: Payer: 59 | Admitting: Psychiatry

## 2015-08-03 ENCOUNTER — Ambulatory Visit (INDEPENDENT_AMBULATORY_CARE_PROVIDER_SITE_OTHER)
Admission: RE | Admit: 2015-08-03 | Discharge: 2015-08-03 | Disposition: A | Discharge status: Home / Self Care | Payer: 59 | Source: Ambulatory Visit | Attending: Family | Admitting: Family

## 2015-08-03 VITALS — BP 124/70 | HR 98 | Temp 98.3°F | Resp 18 | Ht 65.0 in | Wt 231.0 lb

## 2015-08-03 DIAGNOSIS — M25562 Pain in left knee: Secondary | ICD-10-CM | POA: Diagnosis not present

## 2015-08-03 NOTE — Assessment & Plan Note (Signed)
Knee pain of undetermined origin although cannot rule out meniscal pathology or possible osteoarthritis or osteochondral pathology. Obtain x-rays to rule out fractures or osteoarthritis. Treat conservatively with current pain medications, ice, compression and elevation. Continue previous knee brace. Home exercise therapy initiated. Follow up pending conservative treatment as needed.

## 2015-08-03 NOTE — Patient Instructions (Addendum)
Thank you for choosing [facility].  Summary/Instructions:  Your prescription(s) have been submitted to your pharmacy or been printed and provided for you. Please take as directed and contact our office if you believe you are having problem(s) with the medication(s) or have any questions.  Please stop by radiology on the basement level of the building for your x-rays. Your results will be released to [place] (or called to you) after review, usually within 72 hours after test completion. If any treatments or changes are necessary, you will be notified at that same time.  If your symptoms worsen or fail to improve, please contact our office for further instruction, or in case of emergency go directly to the emergency room at the closest medical facility.    Meniscus Tear With Phase I Rehab The meniscus is a C-shaped cartilage structure, located in the knee joint between the thigh bone (femur) and the shinbone (tibia). Two menisci are located in each knee joint: the inner and outer meniscus. The meniscus acts as an adapter between the thigh bone and shinbone, allowing them to fit properly together. It also functions as a shock absorber, to reduce the stress placed on the knee joint and to help supply nutrients to the knee joint cartilage. As people age, the meniscus begins to harden and become more vulnerable to injury. Meniscus tears are a common injury, especially in older athletes. Inner meniscus tears are more common than outer meniscus tears.  SYMPTOMS   Pain in the knee, especially with standing or squatting with the affected leg.  Tenderness along the joint line.  Swelling in the knee joint (effusion), usually starting 1 to 2 days after injury.  Locking or catching of the knee joint, causing inability to straighten the knee completely.  Giving way or buckling of the knee. CAUSES  A meniscus tear occurs when a force is placed on the meniscus that is greater than it can handle.  Common causes of injury include:  Direct hit (trauma) to the knee.  Twisting, pivoting, or cutting (rapidly changing direction while running), kneeling or squatting.  Without injury, due to aging. RISK INCREASES WITH:  Contact sports (football, rugby).  Sports in which cleats are used with pivoting (soccer, lacrosse) or sports in which good shoe grip and sudden change in direction are required (racquetball, basketball, squash).  Previous knee injury.  Associated knee injury, particularly ligament injuries.  Poor strength and flexibility. PREVENTION  Warm up and stretch properly before activity.  Maintain physical fitness:  Strength, flexibility, and endurance.  Cardiovascular fitness.  Protect the knee with a brace or elastic bandage.  Wear properly fitted protective equipment (proper cleats for the surface). PROGNOSIS  Sometimes, meniscus tears heal on their own. However, definitive treatment requires surgery, followed by at least 6 weeks of recovery.  RELATED COMPLICATIONS   Recurring symptoms that result in a chronic problem.  Repeated knee injury, especially if sports are resumed too soon after injury or surgery.  Progression of the tear (the tear gets larger), if untreated.  Arthritis of the knee in later years (with or without surgery).  Complications of surgery, including infection, bleeding, injury to nerves (numbness, weakness, paralysis) continued pain, giving way, locking, nonhealing of meniscus (if repaired), need for further surgery, and knee stiffness (loss of motion). TREATMENT  Treatment first involves the use of ice and medicine, to reduce pain and inflammation. You may find using crutches to walk more comfortable. However, it is okay to bear weight on the injured knee, if  the pain will allow it. Surgery is often advised as a definitive treatment. Surgery is performed through an incision near the joint (arthroscopically). The torn piece of the meniscus is  removed, and if possible the joint cartilage is repaired. After surgery, the joint must be restrained. After restraint, it is important to perform strengthening and stretching exercises to help regain strength and a full range of motion. These exercises may be completed at home or with a therapist.  MEDICATION  If pain medicine is needed, nonsteroidal anti-inflammatory medicines (aspirin and ibuprofen), or other minor pain relievers (acetaminophen), are often advised.  Do not take pain medicine for 7 days before surgery.  Prescription pain relievers may be given, if your caregiver thinks they are needed. Use only as directed and only as much as you need. HEAT AND COLD  Cold treatment (icing) should be applied for 10 to 15 minutes every 2 to 3 hours for inflammation and pain, and immediately after activity that aggravates your symptoms. Use ice packs or an ice massage.  Heat treatment may be used before performing stretching and strengthening activities prescribed by your caregiver, physical therapist, or athletic trainer. Use a heat pack or a warm water soak. SEEK MEDICAL CARE IF:   Symptoms get worse or do not improve in 2 weeks, despite treatment.  New, unexplained symptoms develop. (Drugs used in treatment may produce side effects.) EXERCISES RANGE OF MOTION (ROM) AND STRETCHING EXERCISES - Meniscus Tear, Non-operative, Phase I These are some of the initial exercises with which you may start your rehabilitation program, until you see your caregiver again or until your symptoms are resolved. Remember:   These initial exercises are intended to be gentle. They will help you restore motion without increasing any swelling.  Completing these exercises allows less painful movement and prepares you for the more aggressive strengthening exercises in Phase II.  An effective stretch should be held for at least 30 seconds.  A stretch should never be painful. You should only feel a gentle  lengthening or release in the stretched tissue. RANGE OF MOTION - Knee Flexion, Active  Lie on your back with both knees straight. (If this causes back discomfort, bend your healthy knee, placing your foot flat on the floor.)  Slowly slide your heel back toward your buttocks until you feel a gentle stretch in the front of your knee or thigh.  Hold for __________ seconds. Slowly slide your heel back to the starting position. Repeat __________ times. Complete this exercise __________ times per day.  RANGE OF MOTION - Knee Flexion and Extension, Active-Assisted  Sit on the edge of a table or chair with your thighs firmly supported. It may be helpful to place a folded towel under the end of your right / left thigh.  Flexion (bending): Place the ankle of your healthy leg on top of the other ankle. Use your healthy leg to gently bend your right / left knee until you feel a mild tension across the top of your knee.  Hold for __________ seconds.  Extension (straightening): Switch your ankles so your right / left leg is on top. Use your healthy leg to straighten your right / left knee until you feel a mild tension on the backside of your knee.  Hold for __________ seconds. Repeat __________ times. Complete __________ times per day. STRETCH - Knee Flexion, Supine  Lie on the floor with your right / left heel and foot lightly touching the wall. (Place both feet on the wall if  you do not use a door frame.)  Without using any effort, allow gravity to slide your foot down the wall slowly until you feel a gentle stretch in the front of your right / left knee.  Hold this stretch for __________ seconds. Then return the leg to the starting position, using your healthy leg for help, if needed. Repeat __________ times. Complete this stretch __________ times per day.  STRETCH - Knee Extension Sitting  Sit with your right / left leg/heel propped on another chair, coffee table, or foot stool.  Allow your  leg muscles to relax, letting gravity straighten out your knee.*  You should feel a stretch behind your right / left knee. Hold this position for __________ seconds. Repeat __________ times. Complete this stretch __________ times per day.  *Your physician, physical therapist or athletic trainer may instruct you place a __________ weight on your thigh, just above your kneecap, to deepen the stretch.  STRENGTHENING EXERCISES - Meniscus Tear, Non-operative, Phase I These exercises may help you when beginning to rehabilitate your injury. They may resolve your symptoms with or without further involvement from your physician, physical therapist or athletic trainer. While completing these exercises, remember:   Muscles can gain both the endurance and the strength needed for everyday activities through controlled exercises.  Complete these exercises as instructed by your physician, physical therapist or athletic trainer. Progress the resistance and repetitions only as guided. STRENGTH - Quadriceps, Isometrics  Lie on your back with your right / left leg extended and your opposite knee bent.  Gradually tense the muscles in the front of your right / left thigh. You should see either your knee cap slide up toward your hip or increased dimpling just above the knee. This motion will push the back of the knee down toward the floor, mat, or bed on which you are lying.  Hold the muscle as tight as you can, without increasing your pain, for __________ seconds.  Relax the muscles slowly and completely between each repetition. Repeat __________ times. Complete this exercise __________ times per day.  STRENGTH - Quadriceps, Short Arcs   Lie on your back. Place a __________ inch towel roll under your right / left knee, so that the knee bends slightly.  Raise only your lower leg by tightening the muscles in the front of your thigh. Do not allow your thigh to rise.  Hold this position for __________  seconds. Repeat __________ times. Complete this exercise __________ times per day.  OPTIONAL ANKLE WEIGHTS: Begin with ____________________, but DO NOT exceed ____________________. Increase in 1 pound/0.5 kilogram increments. STRENGTH - Quadriceps, Straight Leg Raises  Quality counts! Watch for signs that the quadriceps muscle is working, to be sure you are strengthening the correct muscles and not "cheating" by substituting with healthier muscles.  Lay on your back with your right / left leg extended and your opposite knee bent.  Tense the muscles in the front of your right / left thigh. You should see either your knee cap slide up or increased dimpling just above the knee. Your thigh may even shake a bit.  Tighten these muscles even more and raise your leg 4 to 6 inches off the floor. Hold for __________ seconds.  Keeping these muscles tense, lower your leg.  Relax the muscles slowly and completely in between each repetition. Repeat __________ times. Complete this exercise __________ times per day.  STRENGTH - Hamstring, Curls   Lay on your stomach with your legs extended. (If you lay  on a bed, your feet may hang over the edge.)  Tighten the muscles in the back of your thigh to bend your right / left knee up to 90 degrees. Keep your hips flat on the bed.  Hold this position for __________ seconds.  Slowly lower your leg back to the starting position. Repeat __________ times. Complete this exercise __________ times per day.  STRENGTH - Quadriceps, Squats  Stand in a door frame so that your feet and knees are in line with the frame.  Use your hands for balance, not support, on the frame.  Slowly lower your weight, bending at the hips and knees. Keep your lower legs upright so that they are parallel with the door frame. Squat only within the range that does not increase your knee pain. Never let your hips drop below your knees.  Slowly return upright, pushing with your legs, not  pulling with your hands. Repeat __________ times. Complete this exercise __________ times per day.  STRENGTH - Quad/VMO, Isometric   Sit in a chair with your right / left knee slightly bent. With your fingertips, feel the VMO muscle just above the inside of your knee. The VMO is important in controlling the position of your kneecap.  Keeping your fingertips on this muscle. Without actually moving your leg, attempt to drive your knee down as if straightening your leg. You should feel your VMO tense. If you have a difficult time, you may wish to try the same exercise on your healthy knee first.  Tense this muscle as hard as you can without increasing any knee pain.  Hold for __________ seconds. Relax the muscles slowly and completely in between each repetition. Repeat __________ times. Complete exercise __________ times per day.    This information is not intended to replace advice given to you by your health care provider. Make sure you discuss any questions you have with your health care provider.   Document Released: 08/22/1998 Document Revised: 12/23/2014 Document Reviewed: 11/20/2008 Elsevier Interactive Patient Education [employer].

## 2015-08-03 NOTE — Progress Notes (Signed)
Pre visit review using our clinic review tool, if applicable. No additional management support is needed unless otherwise documented below in the visit note. 

## 2015-08-03 NOTE — Progress Notes (Signed)
Subjective:    Patient ID: Amanda Vasquez, female    DOB: 1971-07-29, 44 y.o.   MRN: [id number]  Chief Complaint  Patient presents with  . Knee Pain    left knee pain, knee popped while walking quickly to get on an elevator, happened yestereday    HPI:  Amanda Vasquez is a 44 y.o. female who  has a past medical history of DIABETES MELLITUS, TYPE II; ALLERGIC RHINITIS; ANXIETY; PARESTHESIA; OBESITY; AMENORRHEA; DISORDER, BIPOLAR, ATYPICAL MANIC; DYSLIPIDEMIA; Thalamic pain syndrome (hyperesthetic); Migraine; Demyelinating disorder ([place]); Central pain syndrome; and Diabetes mellitus, type II ([place]). and presents today for an acute office visit.    This is a new problem. Associated symptoms of pain located in her left knee has been going on for about 1 day. She was walking yesterday and felt a pop followed by pain that is described as sharp. Aggravated by weight bearing. If she does not move it is dull and achy. Modifying factors include ice and a brace which has helped with the discomfort. She does have central pain syndrome effecting the left side of her body. Notes it feels weak and unsteady. Denies any previous trauma that she is aware. May be some catching sensation.   Allergies  Allergen Reactions  . Latex   . Prednisone      Current Outpatient Prescriptions on File Prior to Visit  Medication Sig Dispense Refill  . ALPRAZolam (XANAX) 0.5 MG tablet Take 1 tablet (0.5 mg total) by mouth 3 (three) times daily as needed for anxiety. 90 tablet 1  . aspirin 81 MG tablet Take 81 mg by mouth daily.    [name] atorvastatin (LIPITOR) 10 MG tablet Take 1 tablet (10 mg total) by mouth daily. Must establish with NEW PCP for additional refills. 90 tablet 0  . b complex vitamins tablet Take 1 tablet by mouth daily.      . baclofen (LIORESAL) 10 MG tablet Take 10 mg by mouth daily.    [name] buPROPion (BUDEPRION XL) 300 MG 24 hr tablet Take 1 tablet (300 mg total) by mouth daily. Take 300 mg by mouth daily. 30  tablet 1  . Cetirizine HCl 10 MG CAPS Take by mouth.    . Cholecalciferol (VITAMIN D3) 2000 UNITS TABS Take 500 Units by mouth daily.     [name] escitalopram (LEXAPRO) 10 MG tablet Take one half a tablet in the morning for seven days the increase to one whole tablet in the morning. 30 tablet 1  . Exenatide ER 2 MG PEN Inject 2 mg into the skin once a week. 3 each 4  . Gabapentin Enacarbil 600 MG TB24 Take 600 mg by mouth at bedtime. (Patient taking differently: Take 1,800 mg by mouth at bedtime. ) 30 tablet   . glipiZIDE (GLUCOTROL) 5 MG tablet Take 1 tablet (5 mg total) by mouth daily. 90 tablet 3  . glucose blood test strip Use as instructed 100 each 12  . GRALISE 600 MG TABS Take 1,800 mg by mouth daily.    . metFORMIN (GLUCOPHAGE) 1000 MG tablet Take 1 tablet (1,000 mg total) by mouth 2 (two) times daily with a meal. 180 tablet 0  . morphine (MS CONTIN) 60 MG 12 hr tablet Take 60 mg by mouth every 12 (twelve) hours.    . multivitamin-iron-minerals-folic acid (CENTRUM) chewable tablet Chew 1 tablet by mouth daily.    [name] NEEDLE, DISP, 25 G (BD ECLIPSE) 25G X 1-1/2" MISC Use to administer vitamin b12 monthly  12 each 0  . ondansetron (ZOFRAN) 4 MG tablet Take as needed for nausea    . oxyCODONE (OXY IR/ROXICODONE) 5 MG immediate release tablet 5 mg. Take 5 mg by mouth every 4 (four) hours as needed for Pain.    . polyethylene glycol (MIRALAX / GLYCOLAX) packet Take 17 g by mouth daily as needed.    . traZODone (DESYREL) 100 MG tablet Take 1 tablet (100 mg total) by mouth at bedtime as needed for sleep (make take one and a half tablet if needed.). 30 tablet 1  . traZODone (DESYREL) 150 MG tablet Take 0.5-2 tablets (75-300 mg total) by mouth at bedtime. Must establish with NEW PCP for additional refills. 60 tablet 0  . valACYclovir (VALTREX) 1000 MG tablet TAKE 1 TABLET EVERY DAY AS NEEDED 60 tablet 0  . Wheat Dextrin (BENEFIBER PO) Take by mouth as needed.     No current facility-administered  medications on file prior to visit.    Past Medical History  Diagnosis Date  . DIABETES MELLITUS, TYPE II   . ALLERGIC RHINITIS   . ANXIETY   . PARESTHESIA   . OBESITY   . AMENORRHEA   . DISORDER, BIPOLAR, ATYPICAL MANIC   . DYSLIPIDEMIA   . Thalamic pain syndrome (hyperesthetic)     follows with Dr Hardin Negus for same, on disability since 09/2011 for same  . Migraine   . Demyelinating disorder ([place])   . Central pain syndrome   . Diabetes mellitus, type II ([place])     Review of Systems  Constitutional: Negative for fever and chills.  Musculoskeletal: Positive for joint swelling.       Positive for left pain.  Neurological: Positive for weakness. Negative for numbness.      Objective:    BP 124/70 mmHg  Pulse 98  Temp(Src) 98.3 F (36.8 C) (Oral)  Resp 18  Ht 5\' 5"  (1.651 m)  Wt 231 lb (104.781 kg)  BMI 38.44 kg/m2  SpO2 98%  LMP 06/24/2015 Nursing note and vital signs reviewed.  Physical Exam  Constitutional: She is oriented to person, place, and time. She appears well-developed and well-nourished. No distress.  Cardiovascular: Normal rate, regular rhythm, normal heart sounds and intact distal pulses.   Pulmonary/Chest: Effort normal and breath sounds normal.  Musculoskeletal:  Left knee - mild edema with no obvious deformity or discoloration noted. Tenderness primarily located along medial joint line and anterior knee. Range of motion is limited to near full extension and approximately 110 flexion secondary to discomfort and pain. Ligamentous testing is negative. Questionable McMurray's. Distal pulses, sensation, and reflexes are intact and appropriate.   Neurological: She is alert and oriented to person, place, and time.  Skin: Skin is warm and dry.  Psychiatric: She has a normal mood and affect. Her behavior is normal. Judgment and thought content normal.       Assessment & Plan:   Problem List Items Addressed This Visit      Other   Left knee pain - Primary      Knee pain of undetermined origin although cannot rule out meniscal pathology or possible osteoarthritis or osteochondral pathology. Obtain x-rays to rule out fractures or osteoarthritis. Treat conservatively with current pain medications, ice, compression and elevation. Continue previous knee brace. Home exercise therapy initiated. Follow up pending conservative treatment as needed.       Relevant Orders   DG Knee Complete 4 Views Left (Completed)

## 2015-08-05 ENCOUNTER — Encounter: Payer: Self-pay | Admitting: Neurology

## 2015-08-05 ENCOUNTER — Ambulatory Visit (INDEPENDENT_AMBULATORY_CARE_PROVIDER_SITE_OTHER): Payer: 59 | Admitting: Neurology

## 2015-08-05 ENCOUNTER — Encounter: Payer: Self-pay | Admitting: *Deleted

## 2015-08-05 VITALS — BP 131/70 | HR 121 | Ht 65.0 in | Wt 231.6 lb

## 2015-08-05 DIAGNOSIS — G245 Blepharospasm: Secondary | ICD-10-CM

## 2015-08-05 NOTE — Patient Instructions (Signed)
Remember to drink plenty of fluid, eat healthy meals and do not skip any meals. Try to eat protein with a every meal and eat a healthy snack such as fruit or nuts in between meals. Try to keep a regular sleep-wake schedule and try to exercise daily, particularly in the form of walking, 20-30 minutes a day, if you can.   As far as your medications are concerned, I would like to suggest: continue Current medications  I would like to see you back in 6 months, sooner if we need to. Please call us with any interim questions, concerns, problems, updates or refill requests.   Our phone number is [phone]. We also have an after hours call service for urgent matters and there is a physician on-call for urgent questions. For any emergencies you know to call 911 or go to the nearest emergency room

## 2015-08-05 NOTE — Progress Notes (Signed)
[facility]    Provider:  Dr Jaynee Eagles Referring Provider: Rowe Clack, MD Primary Care Physician:  Gwendolyn Grant, MD  CC:  Hemifacial spasms and blepharospasm  Initial visit 08/05/2015: She has blepharospasms but noticed different symptoms since last being seen. She has noticed them driving and couldn't see because her eyelids were int he way. She was referred to [facility] for eyelid surgery and chronic dry eye. When she wa being examined the physician noticed spasms in the left lips all the way up the brow. She feels the twitching on the left side of the face. She was referred for botulinum injections for the spasms. It happens all day every day. This causes significant vision loss due to the spasms. She has the eye spasms bilaterally but the lower face only on the left. It lasts for hours and has impacted her driving. She has to use her fingers to hold open her eye lids.   Notes from [facility] state that she has spasms or orbicularis oculi muscles and corrugators causing effot-related closure of the periorbital soft tissue.  With episodic visual field defects secondary to the periorbital spasms.   Follows with [facility].   CC: Thalamic pain syndrome  HPI: CATHARINA PICA is a 44 y.o. female here as a follow up. We have completed a thorough evaluation including images, lumbar puncture and labwork. MRi of the brain was repeated and showed T2/flair hyperintense focus in the deep white matter of the right hemisphere that extends to the left hypothalmus. Explained that thalamic pain syndrome could explain her left-sided symptoms. LP was negative for multiple sclerosis. Oligoclonal bands were seen in the serum and are a non-specific finding and may be caused by a wide range of disorders including infection, inflammation and neoplasia. Patients with at least 2 clones in the csf and serum likely systemic only. Identical csf bands in the CSF and serum likely  systemic immune activation but not of intrathecal synthesis (ocbs in the csf likely explained by passive movement from the serum). At patient's request, screened for systemic inflammatory causes which came back unremarkable. MRi of the cervical spine showed very mild degenerative changes. EMG/NCS of the upper extremity showed right>left CTS which can be contributing to her symptoms. She has been referred to hand surgery.   Today she has severe pain on the left, fasciculations, knots and bumps everywhere, it hurts to extend her elbow on the right. She is hypersensitive, even water on her skin or sheets on her skin on the left side hurts. She is in excruciating pain, even hurts to have her glasses on the left. She can't feel things as well on the left. The fine motor is diminished on the left. She did trigger point injections with Dr. Hardin Negus. She has a cyst on her right foot. She had an ovarian cyst that went away. She has extreme sensitivity and when she is pushing a cart, the vibration hurts her left hand. Her balance is poor, not what it used to be. She has had nausea and vomiting over the last few months. She has to lay in bed in the mornings, she gets very stiff even sitting around at the house. She can't sit or she has extreme pain on both sides of her body and rectal pain. She denies low back pain or shooting pain in her legs. Mother said She stutters when she is in pain and can't get her point. She gets dizzy when she stands up too quickly.  If she forgets her medicine, she wakes up with excruciating pain.   EMG/NCS bilateral uppers: 02/2015 Conclusion: This is an abnormal study. There is electrophysiologic evidence of bilateral, moderately-severe, Right > Left Carpal Tunnel Syndrome. No suggestion of polyneuropathy or radiculopathy. Clinical correlation recommended.   MRi brain and cervical cord 01/2015;  IMPRESSION: This is a mildly abnormal MRI of the cervical spine showing mild degenerative  changes at C3-C4 and C5-C6 that do not lead to any nerve root impingement. The spinal cord appears normal.  IMPRESSION: This MRI of the brain with and without contrast shows a T2/flair hyperintense focus in the deep white matter of the right hemisphere corresponding to the focus reported in 05/13/2010 thatt no longer shows hyperintensity on diffusion-weighted images. Additionally, there are about 5 other punctate T2/flair foci in the subcortical white matter, most of which were present on the prior MRI. These foci are nonspecific and could be due to chronic microvascular ischemic changes, migraine or cardio-emboli. Demyelination would be less likely to have this appearance. No acute findings are noted.  Echo: 02/2015: normal  Labs: ace, hiv, b12, folate, pan-anca, rpr, hep c, celiac, ana comprehensive panel, paraneoplastic panel, IFE, tsh, RF,cmp,cbc, esr, hgba1c, lipids were unremarkable - slightly elevated hgba1c at 7 and esr 31.   LP: protein, glucose, cell count and diff, ocbs, ms profile unremarkable   Initial visit 02/02/2015: WINDIE MARASCO is a 44 y.o. female here as a referral from Dr. Asa Lente for complicated symptoms. Former Therapist, sports. Sounds like she has had a long journey and has been evaluated by multiple neurologists, pain specialists and had many pain procedures all of which have failed to find an etiology for her symptoms. One of her complaints is right arm pain. She has some shooting pain from the right side of the neck to the hand which started in November and have worsened. Goes into all 5 fingers. Feels like her whole hand is burning, like it is going to "shoot off", she can't make a fist, worse at night, she gets swollen lumps in her right upper arm. She can push on the base of her neck and actually cause the pain to shoot down her arm. She also has longer-standing pain symptoms on the entire left side of the body, splits midline to the left almost exactly from head to toe, with  sensitivity of skin. She has left-sided weakness. She has fatigue. She went to [facility] for MRIs and follow up in 2011 and 2012 for abnormal white matter changes. She also saw Dr. Krista Blue in 2011 and 2012. She has been evaluated by multiple neurologists for the same reasons, last 4 years ago as reported by patient. She was following at [facility] with Dr. Moshe Cipro. Has not had an MRi since 2012. No vision changes, has ringing and buzzing in her ears. Left sided issues started in 2011 when she was originally evaluated and diagnosed with a "demyelinating disorder". The left-sided symptoms are worsening. Cold makes her left-sided symptoms worse, she can't get out of bed in the winter. She is on pain medications, the gralise and oxycodone makes her symptoms better. She takes morphine ER. She is on disability due to her condition and follows in pain clinic. She has also seen Dr. Krista Blue here at [facility]. She was diagnosed with RSD in the left foot and arm at some point and saw Dr. Nelva Bush in 2012 who tried doing multiple cervical and lumbar spinal blocks which did not help. She was also seen at [place]  and they did a spinal nerve ablation which did not work longer than a day. By 3-4pm she can't even hold up her head anymore, she has to prop it up against something because of tightness. She tries to get out more now since it is warmer, it takes her 45 minutes to get up and out of the bed in the mornings. In the cold weather she is bed ridden. She was adopted and she does not have infomration about birth family.   Reviewed notes, labs and imaging from outside physicians, which showed: MRI of the brain dated June 2012 showed a single nonenhancing 5 x 3 mm focus of increased T2 FLAIR signal intensity within the sub-occipital white matter of the right parietal lobe. No enhancing lesions.MRI of the cervical spine showed no disc protrusion, spinal canal or neuroforaminal stenosis at any level. Both were done with and without  contrast.  She sees Dr. Unice Cobble who is her primary care. He also notes this reported "demyelinating syndrome" per patient. She was seen at [facility] by Dr. Moshe Cipro and [facility] neurology by Dr. Benjamin Stain. She was last seen at [facility] in July 2013 and apparently she became upset when MS treatments were not pursued. A hand surgeon diagnosed reflex sympathetic dystrophy . She follows in pain clinic.  Review of Systems: Patient complains of symptoms per HPI as well as the following symptoms: light sensitivity, eye pain, blurred vision, cold intolerance, heat intolerance, flushing, eye pain, daytime sleepiness, memory loss, dizziness, speech difficulty, memory loss, weakness, tremors, confusion, depression, anxiety, decr concentration. Pertinent negatives per HPI. All others negative.   Social History   Social History  . Marital Status: Divorced    Spouse Name: N/A  . Number of Children: 0  . Years of Education: 12+   Occupational History  . RN- long-term disability     Social History Main Topics  . Smoking status: Former Smoker    Types: Cigarettes, Cigars    Start date: 06/08/1986    Quit date: 08/22/2008  . Smokeless tobacco: Never Used     Comment: Divorced, lives with boyfriend  . Alcohol Use: No     Comment: Quit drinking in 2010  . Drug Use: No  . Sexual Activity: Yes    Birth Control/ Protection: IUD   Other Topics Concern  . Not on file   Social History Narrative   Lives at home with herself.   Caffeine use: 1 cup coffee 3 times per week    Family History  Problem Relation Age of Onset  . Adopted: Yes    Past Medical History  Diagnosis Date  . DIABETES MELLITUS, TYPE II   . ALLERGIC RHINITIS   . ANXIETY   . PARESTHESIA   . OBESITY   . AMENORRHEA   . DISORDER, BIPOLAR, ATYPICAL MANIC   . DYSLIPIDEMIA   . Thalamic pain syndrome (hyperesthetic)     follows with Dr Hardin Negus for same, on disability since 09/2011 for same  . Migraine   . Demyelinating  disorder ([place])   . Central pain syndrome   . Diabetes mellitus, type II [facility])     Past Surgical History  Procedure Laterality Date  . Breast surgery  1997, 2006    Reduction  . Cholecystectomy  1998  . Intrauterine device insertion  2009  . Knee surgery Right 1993    Current Outpatient Prescriptions  Medication Sig Dispense Refill  . ALPRAZolam (XANAX) 0.5 MG tablet Take 1 tablet (0.5 mg total) by  mouth 3 (three) times daily as needed for anxiety. 90 tablet 1  . aspirin 81 MG tablet Take 81 mg by mouth daily.    [name] atorvastatin (LIPITOR) 10 MG tablet Take 1 tablet (10 mg total) by mouth daily. Must establish with NEW PCP for additional refills. 90 tablet 0  . b complex vitamins tablet Take 1 tablet by mouth daily.      . baclofen (LIORESAL) 10 MG tablet Take 10 mg by mouth daily.    [name] buPROPion (BUDEPRION XL) 300 MG 24 hr tablet Take 1 tablet (300 mg total) by mouth daily. Take 300 mg by mouth daily. 30 tablet 1  . Cetirizine HCl 10 MG CAPS Take by mouth.    . Cholecalciferol (VITAMIN D3) 2000 UNITS TABS Take 500 Units by mouth daily.     [name] escitalopram (LEXAPRO) 10 MG tablet Take one half a tablet in the morning for seven days the increase to one whole tablet in the morning. 30 tablet 1  . Exenatide ER 2 MG PEN Inject 2 mg into the skin once a week. 3 each 4  . glipiZIDE (GLUCOTROL) 5 MG tablet Take 1 tablet (5 mg total) by mouth daily. 90 tablet 3  . glucose blood test strip Use as instructed 100 each 12  . GRALISE 600 MG TABS Take 1,800 mg by mouth daily.    . magnesium sulfate (EPSOM SALT) GRAN 1 bath 2-3 times a week    . metFORMIN (GLUCOPHAGE) 1000 MG tablet Take 1 tablet (1,000 mg total) by mouth 2 (two) times daily with a meal. 180 tablet 0  . morphine (MS CONTIN) 60 MG 12 hr tablet Take 60 mg by mouth every 12 (twelve) hours.    . multivitamin-iron-minerals-folic acid (CENTRUM) chewable tablet Chew 1 tablet by mouth daily.    [name] NEEDLE, DISP, 25 G (BD ECLIPSE) 25G X 1-1/2"  MISC Use to administer vitamin b12 monthly 12 each 0  . ondansetron (ZOFRAN) 4 MG tablet Take as needed for nausea    . oxyCODONE (OXY IR/ROXICODONE) 5 MG immediate release tablet 5 mg. Take 5 mg by mouth every 4 (four) hours as needed for Pain.    . polyethylene glycol (MIRALAX / GLYCOLAX) packet Take 17 g by mouth daily as needed.    [name] POTASSIUM CHLORIDE PO Take 198 mg by mouth daily.    . traZODone (DESYREL) 150 MG tablet Take 0.5-2 tablets (75-300 mg total) by mouth at bedtime. Must establish with NEW PCP for additional refills. 60 tablet 0  . valACYclovir (VALTREX) 1000 MG tablet TAKE 1 TABLET EVERY DAY AS NEEDED 60 tablet 0   No current facility-administered medications for this visit.    Allergies as of 08/05/2015 - Review Complete 08/05/2015  Allergen Reaction Noted  . Latex  07/03/2008  . Prednisone  06/22/2011    Vitals: BP 131/70 mmHg  Pulse 121  Ht _0  (1.651 m)  Wt 231 lb 9.6 oz (105.053 kg)  BMI 38.54 kg/m2  LMP 06/24/2015 Last Weight:  Wt Readings from Last 1 Encounters:  08/05/15 231 lb 9.6 oz (105.053 kg)   Last Height:   Ht Readings from Last 1 Encounters:  08/05/15 _1  (1.651 m)   Physical exam: Exam: Gen: NAD, conversant, well nourised, obese, well groomed  CV: RRR, no MRG. No Carotid Bruits. No peripheral edema, warm, nontender Eyes: Conjunctivae clear without exudates or hemorrhage  Neuro: Detailed Neurologic Exam  Speech:  Speech is normal; fluent and spontaneous with normal comprehension.  Cognition:  The patient is oriented to person, place, and time;   recent and remote memory intact;   language fluent;   normal attention, concentration,   fund of knowledge Cranial Nerves:  The pupils are equal, round, and reactive to light. Extraocular movements are intact. Trigeminal sensation is intact and the muscles of mastication are normal. The face is symmetric. The palate elevates in the midline. Hearing  intact. Voice is normal. Shoulder shrug is normal. The tongue has normal motion without fasciculations.   Motor Observation:  No asymmetry, no atrophy, and no involuntary movements noted. Tone:  Normal muscle tone.   Posture:  Posture is normal. normal erect   Strength:  Strength is V/V in the upper and lower limbs.    Sensation: Splits midline from the face to the thorax/abd/pelvis with hyperalgesia and dysesthesias on the left including left arm and leg.       Assessment/Plan: 44 year old patient here with multiple complaints. She has a complicated past medical history which includes patient reported "demyelinating disorder"and has followed-up with multiple neurologists including at [facility] but never treated. She is on disability and is practically bedridden in the winter months due to dysesthesias and hyperalgesia of the left side of her body. She has right sided neck pain with radicular symptoms into the right arm. She has chronic fatigue and chronic pain syndrome. At one point there was apparently a mention of reflex sympathetic dystrophy I don't see any indication of that on exam. In fact her exam was surprisingly unremarkable except for the left sided sensory problems as above. Strength appears to be intact.    KELLEE SITTNER is a 44 y.o. female here as a follow up. We have completed a thorough evaluation including images, lumbar puncture and labwork. MRi of the brain was repeated and showed T2/flair hyperintense focus in the deep white matter of the right hemisphere that extends to the left hypothalmus. Explained that thalamic pain syndrome could explain her left-sided symptoms. LP was negative for multiple sclerosis. Oligoclonal bands were seen in the serum and are a non-specific finding and may be caused by a wide range of disorders including infection, inflammation and neoplasia. Patients with at least 2 clones in the csf and serum likely systemic only.  Identical csf bands in the CSF and serum likely systemic immune activation but not of intrathecal synthesis (ocbs in the csf likely explained by passive movement from the serum). At patient's request, screened for systemic inflammatory causes which came back unremarkable. MRi of the cervical spine showed very mild degenerative changes. EMG/NCS of the upper extremity showed right>left CTS which can be contributing to her symptoms. She has been referred to hand surgery.  Discussed at length with patient. At this point she should f/u with hand surgery and her pain managagement specialist and continue to followup with her therapist.   Blepharospasm and hemifacial spasms: Will refer for injections for Dr. Rexene [place]  [id number] [facility] [address] [place], [place] [address]  Phone [phone] Fax [phone]  A total of 25 minutes was spent face-to-face with this patient. Over half this time was spent on counseling patient on the blepharospasm diagnosis and different diagnostic and therapeutic options available.

## 2015-08-06 ENCOUNTER — Telehealth: Payer: Self-pay | Admitting: Family

## 2015-08-06 NOTE — Telephone Encounter (Signed)
[employer] ref # [id number] valid 08/06/15-02/04/2016 for 6 visits

## 2015-08-06 NOTE — Telephone Encounter (Signed)
Pt is going to call me back on Monday regarding MRI. She may try to have it done @ [facility].

## 2015-08-06 NOTE — Telephone Encounter (Signed)
Patient called to advise that based on your appt the other day, she needs anew referral to [facility].   Her appt is dr Vickki Hearing on 08/10/2015 @ .3:45 PM   [place],  Patient is requesting to speak with you about the MRI referral already placed,.

## 2015-08-10 ENCOUNTER — Ambulatory Visit (INDEPENDENT_AMBULATORY_CARE_PROVIDER_SITE_OTHER): Payer: 59 | Admitting: Neurology

## 2015-08-10 ENCOUNTER — Encounter: Payer: Self-pay | Admitting: Neurology

## 2015-08-10 VITALS — BP 140/82 | HR 86 | Resp 16 | Ht 65.0 in | Wt 230.0 lb

## 2015-08-10 DIAGNOSIS — G514 Facial myokymia: Secondary | ICD-10-CM

## 2015-08-10 NOTE — Patient Instructions (Signed)
I do not see any telltale evidence of blepharospasm or hemifacial spasm and would not recommend botulinum toxin injections at this time. You can follow up with Dr. Jaynee Eagles as planned.

## 2015-08-10 NOTE — Progress Notes (Signed)
Subjective:    Patient ID: Amanda Vasquez is a 44 y.o. female.  HPI     Interim history:   Dear Berta Minor,   I saw your patient, Amanda Vasquez, upon your kind request in my clinic today for initial consultation of her possible left hemifacial spasm on blepharospasm and evaluation for potential botulinum toxin injections. The patient is unaccompanied today. As you know, Amanda Vasquez is a 44 year old right-handed woman with an underlying medical history of diabetes, allergic rhinitis, anxiety, obesity, mood disorder, hyperlipidemia, chronic pain with a diagnosis of thalamic pain syndrome for which she follows with Dr. Nicholaus Bloom in pain management, and migraines, who reports intermittent twitching around her eyes bilaterally. She also had some facial twitching on the left. She was told she had blepharospasm and was seen by ENT and plastics surgeon Dr. Carlis Abbott out of [facility]. He talk to her about eyelid surgery. She decided not to pursue this. Sometimes she feels she has to pry her eyes open on the left side while driving. Symptoms are intermittent.  Her Past Medical History Is Significant For: Past Medical History  Diagnosis Date  . DIABETES MELLITUS, TYPE II   . ALLERGIC RHINITIS   . ANXIETY   . PARESTHESIA   . OBESITY   . AMENORRHEA   . DISORDER, BIPOLAR, ATYPICAL MANIC   . DYSLIPIDEMIA   . Thalamic pain syndrome (hyperesthetic)     follows with Dr Hardin Negus for same, on disability since 09/2011 for same  . Migraine   . Demyelinating disorder ([place])   . Central pain syndrome   . Diabetes mellitus, type II ([place])     Her Past Surgical History Is Significant For: Past Surgical History  Procedure Laterality Date  . Breast surgery  1997, 2006    Reduction  . Cholecystectomy  1998  . Intrauterine device insertion  2009  . Knee surgery Right 1993    Her Family History Is Significant For: Family History  Problem Relation Age of Onset  . Adopted: Yes    Her Social History Is Significant  For: Social History   Social History  . Marital Status: Divorced    Spouse Name: N/A  . Number of Children: 0  . Years of Education: 12+   Occupational History  . RN- long-term disability     Social History Main Topics  . Smoking status: Former Smoker    Types: Cigarettes, Cigars    Start date: 06/08/1986    Quit date: 08/22/2008  . Smokeless tobacco: Never Used     Comment: Divorced, lives with boyfriend  . Alcohol Use: No     Comment: Quit drinking in 2010  . Drug Use: No  . Sexual Activity: Yes    Birth Control/ Protection: IUD   Other Topics Concern  . None   Social History Narrative   Lives at home with herself.   Caffeine use: 1 cup coffee 3 times per week    Her Allergies Are:  Allergies  Allergen Reactions  . Latex   . Prednisone   :   Her Current Medications Are:  Outpatient Encounter Prescriptions as of 08/10/2015  Medication Sig  . ALPRAZolam (XANAX) 0.5 MG tablet Take 1 tablet (0.5 mg total) by mouth 3 (three) times daily as needed for anxiety.  [name] aspirin 81 MG tablet Take 81 mg by mouth daily.  [name] atorvastatin (LIPITOR) 10 MG tablet Take 1 tablet (10 mg total) by mouth daily. Must establish with NEW PCP for additional refills.  [name]  b complex vitamins tablet Take 1 tablet by mouth daily.    . baclofen (LIORESAL) 10 MG tablet Take 10 mg by mouth daily.  [name] buPROPion (BUDEPRION XL) 300 MG 24 hr tablet Take 1 tablet (300 mg total) by mouth daily. Take 300 mg by mouth daily.  . Cetirizine HCl 10 MG CAPS Take by mouth.  . Cholecalciferol (VITAMIN D3) 2000 UNITS TABS Take 500 Units by mouth daily.   [name] escitalopram (LEXAPRO) 10 MG tablet Take one half a tablet in the morning for seven days the increase to one whole tablet in the morning.  . Exenatide ER 2 MG PEN Inject 2 mg into the skin once a week.  [name] glipiZIDE (GLUCOTROL) 5 MG tablet Take 1 tablet (5 mg total) by mouth daily.  [name] glucose blood test strip Use as instructed  . GRALISE 600 MG TABS Take 1,800 mg  by mouth daily.  . magnesium sulfate (EPSOM SALT) GRAN 1 bath 2-3 times a week  . metFORMIN (GLUCOPHAGE) 1000 MG tablet Take 1 tablet (1,000 mg total) by mouth 2 (two) times daily with a meal.  . morphine (MS CONTIN) 60 MG 12 hr tablet Take 60 mg by mouth every 12 (twelve) hours.  . multivitamin-iron-minerals-folic acid (CENTRUM) chewable tablet Chew 1 tablet by mouth daily.  [name] NEEDLE, DISP, 25 G (BD ECLIPSE) 25G X 1-1/2" MISC Use to administer vitamin b12 monthly  . ondansetron (ZOFRAN) 4 MG tablet Take as needed for nausea  . oxyCODONE (OXY IR/ROXICODONE) 5 MG immediate release tablet 5 mg. Take 5 mg by mouth every 4 (four) hours as needed for Pain.  . polyethylene glycol (MIRALAX / GLYCOLAX) packet Take 17 g by mouth daily as needed.  [name] POTASSIUM CHLORIDE PO Take 198 mg by mouth daily.  . traZODone (DESYREL) 150 MG tablet Take 0.5-2 tablets (75-300 mg total) by mouth at bedtime. Must establish with NEW PCP for additional refills.  . valACYclovir (VALTREX) 1000 MG tablet TAKE 1 TABLET EVERY DAY AS NEEDED   No facility-administered encounter medications on file as of 08/10/2015.  :  Review of Systems:  Out of a complete 14 point review of systems, all are reviewed and negative with the exception of these symptoms as listed below:   Review of Systems  Neurological:       Patient reports that she started having increased L facial spasms starting this past summer. She states that she has had them for over 10 years but has recently become worse.     Objective:  Neurologic Exam  Physical Exam Physical Examination:   Filed Vitals:   08/10/15 1504  BP: 140/82  Pulse: 86  Resp: 16    General Examination: The patient is a very pleasant 44 y.o. female in no acute distress. She appears well-developed and well-nourished and well groomed.   HEENT: Normocephalic, atraumatic, pupils are equal, round and reactive to light and accommodation. Funduscopic exam is normal with sharp disc margins  noted. Extraocular tracking is good without limitation to gaze excursion or nystagmus noted. Normal smooth pursuit is noted. Hearing is grossly intact. Face is symmetric with normal facial animation and normal facial sensation. She has no evidence of ptosis, no blepharospasm is visible, no apraxia of eyelid opening is detected and there is no overt facial twitching notable either. Speech is clear with no dysarthria noted. There is no hypophonia. There is no lip, neck/head, jaw or voice tremor. Neck is supple with full range of passive and active motion. There are  no carotid bruits on auscultation. Oropharynx exam reveals: moderate mouth dryness, adequate dental hygiene and mild airway crowding, due to redundant soft palate. There is mild pharyngeal erythema. Mallampati is class I.  Chest: Clear to auscultation without wheezing, rhonchi or crackles noted.  Heart: S1+S2+0, regular and normal without murmurs, rubs or gallops noted.   Abdomen: Soft, non-tender and non-distended with normal bowel sounds appreciated on auscultation.  Extremities: There is no pitting edema in the distal lower extremities bilaterally. Pedal pulses are intact.  Skin: Warm and dry without trophic changes noted. There are no varicose veins.  Musculoskeletal: exam reveals no obvious joint deformities, tenderness or joint swelling or erythema, but she reports having twisted her left knee and pain in her left knee for which she has an appointment with orthopedics this afternoon.   Neurologically:  Mental status: The patient is awake, alert and oriented in all 4 spheres. Her immediate and remote memory, attention, language skills and fund of knowledge are appropriate. There is no evidence of aphasia, agnosia, apraxia or anomia. Speech is clear with normal prosody and enunciation. Thought process is linear. Mood is normal and affect is normal.  Cranial nerves II - XII are as described above under HEENT exam. In addition: shoulder  shrug is normal with equal shoulder height noted. Motor exam: Normal bulk, strength and tone is noted. There is no drift, tremor or rebound. Romberg is negative. Reflexes are 2+ throughout. Fine motor skills and coordination: intact with normal finger taps, normal hand movements, normal rapid alternating patting, normal foot taps and normal foot agility.  Cerebellar testing: No dysmetria or intention tremor on finger to nose testing. Heel to shin is unremarkable bilaterally. There is no truncal or gait ataxia.  Sensory exam: intact to light touch, pinprick, vibration, temperature sense in the upper and lower extremities, but she is tender to touch in her left leg and left arm.  Gait, station and balance: She stands easily. No veering to one side is noted. No leaning to one side is noted. Posture is age-appropriate and stance is narrow based. Gait shows slight limp on the left. Tandem walk is possible that slowly and with caution.  Assessment and Plan:  In summary, Amanda Vasquez is a very pleasant 44 y.o.-year old female with an underlying medical history of diabetes, allergic rhinitis, anxiety, obesity, mood disorder, hyperlipidemia, chronic pain with a diagnosis of thalamic pain syndrome for which she follows with Dr. Nicholaus Bloom in pain management, and migraines, who reports intermittent twitching around her eyes bilaterally. On examination, she has no telltale evidence of blepharospasm or hemifacial spasm. I do not think botulinum toxin injection is indicated at this time. I explained my findings to her and she was agreeable. She would like to avoid any more medications or medical intervention unless absolutely necessary. She is advised to limit her driving if she feels that her eyes are strained or twitchy. She reports twitching around her eyelids and this could be in keeping with benign fasciculations. These were also not notable on exam today. She has had some left knee pain for which she has an  appointment with orthopedics this afternoon. From my end of things, I do not need to see her back in follow-up. I answered all her questions today and the patient was in agreement.  Thank you very much for allowing me to participate in the care of this nice patient. If I can be of any further assistance to you please do not hesitate to talk  to me.  Sincerely,   Star Age, MD, PhD

## 2015-08-11 ENCOUNTER — Encounter: Payer: Self-pay | Admitting: Internal Medicine

## 2015-08-11 ENCOUNTER — Ambulatory Visit (INDEPENDENT_AMBULATORY_CARE_PROVIDER_SITE_OTHER): Payer: 59 | Admitting: Internal Medicine

## 2015-08-11 ENCOUNTER — Other Ambulatory Visit (INDEPENDENT_AMBULATORY_CARE_PROVIDER_SITE_OTHER): Payer: 59

## 2015-08-11 VITALS — BP 130/80 | HR 102 | Temp 97.5°F | Ht 65.0 in | Wt 231.0 lb

## 2015-08-11 DIAGNOSIS — G890 Central pain syndrome: Secondary | ICD-10-CM

## 2015-08-11 DIAGNOSIS — Z0000 Encounter for general adult medical examination without abnormal findings: Secondary | ICD-10-CM

## 2015-08-11 DIAGNOSIS — E119 Type 2 diabetes mellitus without complications: Secondary | ICD-10-CM

## 2015-08-11 DIAGNOSIS — E1169 Type 2 diabetes mellitus with other specified complication: Secondary | ICD-10-CM

## 2015-08-11 DIAGNOSIS — E669 Obesity, unspecified: Secondary | ICD-10-CM

## 2015-08-11 LAB — BASIC METABOLIC PANEL
BUN: 16 mg/dL (ref 6–23)
CO2: 23 mEq/L (ref 19–32)
Calcium: 9.2 mg/dL (ref 8.4–10.5)
Chloride: 102 mEq/L (ref 96–112)
Creatinine, Ser: 0.68 mg/dL (ref 0.40–1.20)
GFR: 99.26 mL/min (ref >60.00)
Glucose, Bld: 158 mg/dL — ABNORMAL HIGH (ref 70–99)
Potassium: 4 mEq/L (ref 3.5–5.1)
Sodium: 135 mEq/L (ref 135–145)

## 2015-08-11 LAB — CBC WITH DIFFERENTIAL/PLATELET
Basophils Absolute: 0 10*3/uL (ref 0.0–0.1)
Basophils Relative: 0.2 % (ref 0.0–3.0)
Eosinophils Absolute: 0 10*3/uL (ref 0.0–0.7)
Eosinophils Relative: 0.1 % (ref 0.0–5.0)
HCT: 39 % (ref 36.0–46.0)
Hemoglobin: 12.7 g/dL (ref 12.0–15.0)
Lymphocytes Relative: 17.3 % (ref 12.0–46.0)
Lymphs Abs: 2.3 10*3/uL (ref 0.7–4.0)
MCHC: 32.6 g/dL (ref 30.0–36.0)
MCV: 86.8 fl (ref 78.0–100.0)
Monocytes Absolute: 0.6 10*3/uL (ref 0.1–1.0)
Monocytes Relative: 4.5 % (ref 3.0–12.0)
Neutro Abs: 10.1 10*3/uL — ABNORMAL HIGH (ref 1.4–7.7)
Neutrophils Relative %: 77.9 % — ABNORMAL HIGH (ref 43.0–77.0)
Platelets: 261 10*3/uL (ref 150.0–400.0)
RBC: 4.5 Mil/uL (ref 3.87–5.11)
RDW: 13.8 % (ref 11.5–15.5)
WBC: 13 10*3/uL — ABNORMAL HIGH (ref 4.0–10.5)

## 2015-08-11 LAB — HEMOGLOBIN A1C: Hgb A1c MFr Bld: 7 % — ABNORMAL HIGH (ref 4.6–6.5)

## 2015-08-11 LAB — LIPID PANEL
Cholesterol: 173 mg/dL (ref 0–200)
HDL: 61.5 mg/dL (ref >39.00)
LDL Cholesterol: 91 mg/dL (ref 0–99)
NonHDL: 111.23
Total CHOL/HDL Ratio: 3
Triglycerides: 100 mg/dL (ref 0.0–149.0)
VLDL: 20 mg/dL (ref 0.0–40.0)

## 2015-08-11 LAB — TSH: TSH: 0.7 u[IU]/mL (ref 0.35–4.50)

## 2015-08-11 LAB — HEPATIC FUNCTION PANEL
ALT: 11 U/L (ref 0–35)
AST: 10 U/L (ref 0–37)
Albumin: 3.8 g/dL (ref 3.5–5.2)
Alkaline Phosphatase: 53 U/L (ref 39–117)
Bilirubin, Direct: 0 mg/dL (ref 0.0–0.3)
Total Bilirubin: 0.3 mg/dL (ref 0.2–1.2)
Total Protein: 6.5 g/dL (ref 6.0–8.3)

## 2015-08-11 LAB — MICROALBUMIN / CREATININE URINE RATIO
Creatinine,U: 111.1 mg/dL
Microalb Creat Ratio: 0.6 mg/g (ref 0.0–30.0)
Microalb, Ur: 0.7 mg/dL (ref 0.0–1.9)

## 2015-08-11 MED ORDER — GLIPIZIDE 5 MG PO TABS: 5.0000 mg | ORAL_TABLET | Freq: Every day | ORAL | Status: DC

## 2015-08-11 MED ORDER — ATORVASTATIN CALCIUM 10 MG PO TABS: 10.0000 mg | ORAL_TABLET | Freq: Every day | ORAL | Status: DC

## 2015-08-11 MED ORDER — CETIRIZINE HCL 10 MG PO CAPS: 10.0000 mg | ORAL_CAPSULE | Freq: Every day | ORAL | Status: AC | PRN

## 2015-08-11 MED ORDER — EXENATIDE ER 2 MG ~~LOC~~ PEN: 2.0000 mg | PEN_INJECTOR | SUBCUTANEOUS | Status: DC

## 2015-08-11 MED ORDER — METFORMIN HCL 1000 MG PO TABS: 1000.0000 mg | ORAL_TABLET | Freq: Two times a day (BID) | ORAL | Status: DC

## 2015-08-11 NOTE — Assessment & Plan Note (Signed)
Weight trend reviewed -  ?consider potential bariatric intervention as suggested by general surgeon at office visit September 2014 Begin with focus on diet, exercise and lifestyle modifications  In 04/2013, used short term Phentermine to help reach weight loss goals - Has used same successfully in past for temporary loss Reports 10# gain since adding Lexapro 05/2015 - ?change same - defer to psyc  Wt Readings from Last 3 Encounters:  08/11/15 231 lb (104.781 kg)  08/10/15 230 lb (104.327 kg)  08/05/15 231 lb 9.6 oz (105.053 kg)

## 2015-08-11 NOTE — Assessment & Plan Note (Signed)
fasting cbgs 150-160 reviewed today Stopped onglyza 02/2011 as ineffective control 02/2011 started victoza and continued metformin briefly on Byetta 08/2012 per formulary, but stopped due to weight gain and edema Resumed victoza 12/2012 due to a1c >7 Changed to weekly Byderon 11/2014 for ease of administration, but now formulary change prompting need for changes so resume Victoza Check labs  Lab Results  Component Value Date   HGBA1C 7.0* 11/28/2014

## 2015-08-11 NOTE — Assessment & Plan Note (Signed)
L elbow injury with subsquent pain summer 2011 - ?thalamic source of pain s/p tx for RSD by dr. Nelva Bush - s/p injections, ablation and chronic narcotic tx>unimproved reviewed potential med problems such as gastroparesis and memory problems with chronic high dose narcotic use and encouraged use of fewer meds if pain will allow -  Changed from [place] to [place] fall 2012 - current med regimen reviewed: MSContin 60mg  q12h, prn Oxy 5mg , gralise 1800mg  qd Also working with ortho for steroid injections as needed Disability hearing pending early 2017? The current medical regimen is effective;  continue present plan and medications as per pain clinic and ortho

## 2015-08-11 NOTE — Progress Notes (Signed)
Subjective:    Patient ID: Amanda Vasquez, female    DOB: August 31, 1970, 44 y.o.   MRN: [id number]  HPI  patient is here today for annual physical. Patient feels well and has no complaints. Also reviewed chronic medical conditions, interval events and current concerns  Past Medical History  Diagnosis Date  . ALLERGIC RHINITIS   . ANXIETY   . PARESTHESIA   . OBESITY   . AMENORRHEA   . DISORDER, BIPOLAR, ATYPICAL MANIC   . DYSLIPIDEMIA   . Thalamic pain syndrome (hyperesthetic)     follows with Dr Hardin Negus for same, on disability since 09/2011 for same  . Migraine   . Demyelinating disorder ([place])   . Central pain syndrome   . Diabetes mellitus, type II ([place])    Family History  Problem Relation Age of Onset  . Adopted: Yes   Social History  Substance Use Topics  . Smoking status: Former Smoker    Types: Cigarettes, Cigars    Start date: 06/08/1986    Quit date: 08/22/2008  . Smokeless tobacco: Never Used  . Alcohol Use: No     Comment: Quit drinking in 2010    Review of Systems  Constitutional: Negative for fatigue and unexpected weight change.  Respiratory: Negative for cough, shortness of breath and wheezing.   Cardiovascular: Negative for chest pain, palpitations and leg swelling.  Gastrointestinal: Negative for nausea, abdominal pain and diarrhea.  Musculoskeletal: Positive for myalgias (chronic) and arthralgias (L knee, twist injury 2 weeks ago - seeing ortho for same, steroid IA yesterday).  Neurological: Negative for dizziness, weakness, light-headedness and headaches.  Psychiatric/Behavioral: Negative for dysphoric mood. The patient is not nervous/anxious.   All other systems reviewed and are negative.  Patient Care Team: Rowe Clack, MD as PCP - General (Internal Medicine) Marcial Pacas, MD as Consulting Physician (Neurology) Jovita Gamma, MD as Consulting Physician (Neurosurgery) Roseanne Kaufman, MD as Consulting Physician (Orthopedic Surgery) Nicholaus Bloom, MD (Anesthesiology) Pedro Earls, MD (Sports Medicine) Marjie Skiff, MD (Psychiatry) Cristin Ardis Hughs, MD (Psychology)     Objective:    Physical Exam  Constitutional: She is oriented to person, place, and time. She appears well-developed and well-nourished. No distress.  obese  HENT:  Head: Normocephalic and atraumatic.  Right Ear: External ear normal.  Left Ear: External ear normal.  Nose: Nose normal.  Mouth/Throat: Oropharynx is clear and moist. No oropharyngeal exudate.  Eyes: EOM are normal. Pupils are equal, round, and reactive to light. Right eye exhibits no discharge. Left eye exhibits no discharge. No scleral icterus.  Neck: Normal range of motion. Neck supple. No JVD present. No tracheal deviation present. No thyromegaly present.  Cardiovascular: Normal rate, regular rhythm, normal heart sounds and intact distal pulses.  Exam reveals no friction rub.   No murmur heard. Pulmonary/Chest: Effort normal and breath sounds normal. No respiratory distress. She has no wheezes. She has no rales. She exhibits no tenderness.  Abdominal: Soft. Bowel sounds are normal. She exhibits no distension and no mass. There is no tenderness. There is no rebound and no guarding.  Musculoskeletal: Normal range of motion.  Lymphadenopathy:    She has no cervical adenopathy.  Neurological: She is alert and oriented to person, place, and time. She has normal reflexes. No cranial nerve deficit.  Skin: Skin is warm and dry. No rash noted. She is not diaphoretic. No erythema.  Psychiatric: She has a normal mood and affect. Her behavior is normal. Judgment and thought content  normal.  Nursing note and vitals reviewed.   BP 130/80 mmHg  Pulse 102  Temp(Src) 97.5 F (36.4 C) (Oral)  Ht 5\' 5"  (1.651 m)  Wt 231 lb (104.781 kg)  BMI 38.44 kg/m2  SpO2 97% Wt Readings from Last 3 Encounters:  08/11/15 231 lb (104.781 kg)  08/10/15 230 lb (104.327 kg)  08/05/15 231 lb 9.6 oz (105.053 kg)      Lab Results  Component Value Date   WBC 6.7 01/20/2015   HGB 13.3 01/20/2015   HCT 39.5 01/20/2015   PLT 258.0 01/20/2015   GLUCOSE 78 11/28/2014   CHOL 148 11/28/2014   TRIG 101.0 11/28/2014   HDL 60.60 11/28/2014   LDLDIRECT 138.8 06/22/2011   LDLCALC 67 11/28/2014   ALT 11 11/28/2014   AST 13 11/28/2014   NA 136 11/28/2014   K 4.2 11/28/2014   CL 103 11/28/2014   CREATININE 0.74 11/28/2014   BUN 13 11/28/2014   CO2 25 11/28/2014   TSH 0.65 01/20/2015   HGBA1C 7.0* 11/28/2014   MICROALBUR 0.2 09/12/2012    Dg Knee Complete 4 Views Left  08/03/2015  CLINICAL DATA:  Onset of pain and swelling following a loud pop in the knee yesterday. EXAM: LEFT KNEE - COMPLETE 4+ VIEW COMPARISON:  None in PACs FINDINGS: The bones of the left knee are adequately mineralized. There is mild narrowing of the medial joint compartment. There is beaking of the tibial spines. There is a suprapatellar effusion. There is transversely oriented lucency through the superior pole of the patella. This may reflect a congenitally bipartite patella, an osteophyte through which a fracture has occurred, or calcification within the quadriceps tendon or the adjacent bursa or joint capsule. There is a small spur arising from the inferior articular margin of the patella. The proximal fibula is intact. IMPRESSION: There is a suprapatellar effusion. Please see the discussion above regarding the superior aspect of the patella. Correlation with the patient's site of symptoms is needed. MRI would be a useful next imaging step. Mild narrowing of the medial joint compartment consistent with osteoarthritis. Electronically Signed   By: David  Martinique M.D.   On: 08/03/2015 16:56       Assessment & Plan:   CPX/z00.00 - Patient has been counseled on age-appropriate routine health concerns for screening and prevention. These are reviewed and up-to-date. Immunizations are up-to-date or declined. Labs ordered and  reviewed.  Problem List Items Addressed This Visit    Diabetes mellitus type 2 in obese ([place])    fasting cbgs 150-160 reviewed today Stopped onglyza 02/2011 as ineffective control 02/2011 started victoza and continued metformin briefly on Byetta 08/2012 per formulary, but stopped due to weight gain and edema Resumed victoza 12/2012 due to a1c >7 Changed to weekly Byderon 11/2014 for ease of administration, but now formulary change prompting need for changes so resume Victoza Check labs  Lab Results  Component Value Date   HGBA1C 7.0* 11/28/2014        OBESITY    Weight trend reviewed -  ?consider potential bariatric intervention as suggested by general surgeon at office visit September 2014 Begin with focus on diet, exercise and lifestyle modifications  In 04/2013, used short term Phentermine to help reach weight loss goals - Has used same successfully in past for temporary loss Reports 10# gain since adding Lexapro 05/2015 - ?change same - defer to psyc  Wt Readings from Last 3 Encounters:  08/11/15 231 lb (104.781 kg)  08/10/15 230 lb (104.327 kg)  08/05/15 231 lb 9.6 oz (105.053 kg)        Thalamic pain syndrome (hyperesthetic)    L elbow injury with subsquent pain summer 2011 - ?thalamic source of pain s/p tx for RSD by dr. Nelva Bush - s/p injections, ablation and chronic narcotic tx>unimproved reviewed potential med problems such as gastroparesis and memory problems with chronic high dose narcotic use and encouraged use of fewer meds if pain will allow -  Changed from [place] to [place] fall 2012 - current med regimen reviewed: MSContin 60mg  q12h, prn Oxy 5mg , gralise 1800mg  qd Also working with ortho for steroid injections as needed Disability hearing pending early 2017? The current medical regimen is effective;  continue present plan and medications as per pain clinic and ortho       Other Visit Diagnoses    Routine general medical examination at a health care facility    -   Primary        Gwendolyn Grant, MD

## 2015-08-11 NOTE — Patient Instructions (Addendum)
It was good to see you today.  We have reviewed your prior records including labs and tests today  Health Maintenance reviewed - all recommended immunizations and age-appropriate screenings are up-to-date.  Test(s) ordered today. Your results will be released to [place] (or called to you) after review, usually within 72hours after test completion. If any changes need to be made, you will be notified at that same time.  Medications reviewed and updated, no changes recommended at this time. Refill on medication(s) as discussed today. Let us know if new diabetes mellitus medication is needed after Jan 2017  Continue working with your other specialists as reviewed  Please schedule followup in 3-6 months for semiannual diabetes mellitus exam and labs with your new PCP at [place], call sooner if problems.    Health Maintenance, Female Adopting a healthy lifestyle and getting preventive care can go a long way to promote health and wellness. Talk with your health care provider about what schedule of regular examinations is right for you. This is a good chance for you to check in with your provider about disease prevention and staying healthy. In between checkups, there are plenty of things you can do on your own. Experts have done a lot of research about which lifestyle changes and preventive measures are most likely to keep you healthy. Ask your health care provider for more information. WEIGHT AND DIET  Eat a healthy diet  Be sure to include plenty of vegetables, fruits, low-fat dairy products, and lean protein.  Do not eat a lot of foods high in solid fats, added sugars, or salt.  Get regular exercise. This is one of the most important things you can do for your health.  Most adults should exercise for at least 150 minutes each week. The exercise should increase your heart rate and make you sweat (moderate-intensity exercise).  Most adults should also do strengthening  exercises at least twice a week. This is in addition to the moderate-intensity exercise.  Maintain a healthy weight  Body mass index (BMI) is a measurement that can be used to identify possible weight problems. It estimates body fat based on height and weight. Your health care provider can help determine your BMI and help you achieve or maintain a healthy weight.  For females 64 years of age and older:   A BMI below 18.5 is considered underweight.  A BMI of 18.5 to 24.9 is normal.  A BMI of 25 to 29.9 is considered overweight.  A BMI of 30 and above is considered obese.  Watch levels of cholesterol and blood lipids  You should start having your blood tested for lipids and cholesterol at 45 years of age, then have this test every 5 years.  You may need to have your cholesterol levels checked more often if:  Your lipid or cholesterol levels are high.  You are older than 45 years of age.  You are at high risk for heart disease.  CANCER SCREENING   Lung Cancer  Lung cancer screening is recommended for adults 27-54 years old who are at high risk for lung cancer because of a history of smoking.  A yearly low-dose CT scan of the lungs is recommended for people who:  Currently smoke.  Have quit within the past 15 years.  Have at least a 30-pack-year history of smoking. A pack year is smoking an average of one pack of cigarettes a day for 1 year.  Yearly screening should continue until it has been  15 years since you quit.  Yearly screening should stop if you develop a health problem that would prevent you from having lung cancer treatment.  Breast Cancer  Practice breast self-awareness. This means understanding how your breasts normally appear and feel.  It also means doing regular breast self-exams. Let your health care provider know about any changes, no matter how small.  If you are in your 20s or 30s, you should have a clinical breast exam (CBE) by a health care  provider every 1-3 years as part of a regular health exam.  If you are 40 or older, have a CBE every year. Also consider having a breast X-ray (mammogram) every year.  If you have a family history of breast cancer, talk to your health care provider about genetic screening.  If you are at high risk for breast cancer, talk to your health care provider about having an MRI and a mammogram every year.  Breast cancer gene (BRCA) assessment is recommended for women who have family members with BRCA-related cancers. BRCA-related cancers include:  Breast.  Ovarian.  Tubal.  Peritoneal cancers.  Results of the assessment will determine the need for genetic counseling and BRCA1 and BRCA2 testing. Cervical Cancer Your health care provider may recommend that you be screened regularly for cancer of the pelvic organs (ovaries, uterus, and vagina). This screening involves a pelvic examination, including checking for microscopic changes to the surface of your cervix (Pap test). You may be encouraged to have this screening done every 3 years, beginning at age 38.  For women ages 67-65, health care providers may recommend pelvic exams and Pap testing every 3 years, or they may recommend the Pap and pelvic exam, combined with testing for human papilloma virus (HPV), every 5 years. Some types of HPV increase your risk of cervical cancer. Testing for HPV may also be done on women of any age with unclear Pap test results.  Other health care providers may not recommend any screening for nonpregnant women who are considered low risk for pelvic cancer and who do not have symptoms. Ask your health care provider if a screening pelvic exam is right for you.  If you have had past treatment for cervical cancer or a condition that could lead to cancer, you need Pap tests and screening for cancer for at least 20 years after your treatment. If Pap tests have been discontinued, your risk factors (such as having a new sexual  partner) need to be reassessed to determine if screening should resume. Some women have medical problems that increase the chance of getting cervical cancer. In these cases, your health care provider may recommend more frequent screening and Pap tests. Colorectal Cancer  This type of cancer can be detected and often prevented.  Routine colorectal cancer screening usually begins at 45 years of age and continues through 45 years of age.  Your health care provider may recommend screening at an earlier age if you have risk factors for colon cancer.  Your health care provider may also recommend using home test kits to check for hidden blood in the stool.  A small camera at the end of a tube can be used to examine your colon directly (sigmoidoscopy or colonoscopy). This is done to check for the earliest forms of colorectal cancer.  Routine screening usually begins at age 98.  Direct examination of the colon should be repeated every 5-10 years through 45 years of age. However, you may need to be screened more often if  early forms of precancerous polyps or small growths are found. Skin Cancer  Check your skin from head to toe regularly.  Tell your health care provider about any new moles or changes in moles, especially if there is a change in a mole's shape or color.  Also tell your health care provider if you have a mole that is larger than the size of a pencil eraser.  Always use sunscreen. Apply sunscreen liberally and repeatedly throughout the day.  Protect yourself by wearing long sleeves, pants, a wide-brimmed hat, and sunglasses whenever you are outside. HEART DISEASE, DIABETES, AND HIGH BLOOD PRESSURE   High blood pressure causes heart disease and increases the risk of stroke. High blood pressure is more likely to develop in:  People who have blood pressure in the high end of the normal range (130-139/85-89 mm Hg).  People who are overweight or obese.  People who are African  American.  If you are 61-13 years of age, have your blood pressure checked every 3-5 years. If you are 39 years of age or older, have your blood pressure checked every year. You should have your blood pressure measured twice--once when you are at a hospital or clinic, and once when you are not at a hospital or clinic. Record the average of the two measurements. To check your blood pressure when you are not at a hospital or clinic, you can use:  An automated blood pressure machine at a pharmacy.  A home blood pressure monitor.  If you are between 49 years and 21 years old, ask your health care provider if you should take aspirin to prevent strokes.  Have regular diabetes screenings. This involves taking a blood sample to check your fasting blood sugar level.  If you are at a normal weight and have a low risk for diabetes, have this test once every three years after 45 years of age.  If you are overweight and have a high risk for diabetes, consider being tested at a younger age or more often. PREVENTING INFECTION  Hepatitis B  If you have a higher risk for hepatitis B, you should be screened for this virus. You are considered at high risk for hepatitis B if:  You were born in a country where hepatitis B is common. Ask your health care provider which countries are considered high risk.  Your parents were born in a high-risk country, and you have not been immunized against hepatitis B (hepatitis B vaccine).  You have HIV or AIDS.  You use needles to inject street drugs.  You live with someone who has hepatitis B.  You have had sex with someone who has hepatitis B.  You get hemodialysis treatment.  You take certain medicines for conditions, including cancer, organ transplantation, and autoimmune conditions. Hepatitis C  Blood testing is recommended for:  Everyone born from [birth year] through 1966.  Anyone with known risk factors for hepatitis C. Sexually transmitted infections  (STIs)  You should be screened for sexually transmitted infections (STIs) including gonorrhea and chlamydia if:  You are sexually active and are younger than 45 years of age.  You are older than 46 years of age and your health care provider tells you that you are at risk for this type of infection.  Your sexual activity has changed since you were last screened and you are at an increased risk for chlamydia or gonorrhea. Ask your health care provider if you are at risk.  If you do not have HIV, but  are at risk, it may be recommended that you take a prescription medicine daily to prevent HIV infection. This is called pre-exposure prophylaxis (PrEP). You are considered at risk if:  You are sexually active and do not regularly use condoms or know the HIV status of your partner(s).  You take drugs by injection.  You are sexually active with a partner who has HIV. Talk with your health care provider about whether you are at high risk of being infected with HIV. If you choose to begin PrEP, you should first be tested for HIV. You should then be tested every 3 months for as long as you are taking PrEP.  PREGNANCY   If you are premenopausal and you may become pregnant, ask your health care provider about preconception counseling.  If you may become pregnant, take 400 to 800 micrograms (mcg) of folic acid every day.  If you want to prevent pregnancy, talk to your health care provider about birth control (contraception). OSTEOPOROSIS AND MENOPAUSE   Osteoporosis is a disease in which the bones lose minerals and strength with aging. This can result in serious bone fractures. Your risk for osteoporosis can be identified using a bone density scan.  If you are 66 years of age or older, or if you are at risk for osteoporosis and fractures, ask your health care provider if you should be screened.  Ask your health care provider whether you should take a calcium or vitamin D supplement to lower your risk  for osteoporosis.  Menopause may have certain physical symptoms and risks.  Hormone replacement therapy may reduce some of these symptoms and risks. Talk to your health care provider about whether hormone replacement therapy is right for you.  HOME CARE INSTRUCTIONS   Schedule regular health, dental, and eye exams.  Stay current with your immunizations.   Do not use any tobacco products including cigarettes, chewing tobacco, or electronic cigarettes.  If you are pregnant, do not drink alcohol.  If you are breastfeeding, limit how much and how often you drink alcohol.  Limit alcohol intake to no more than 1 drink per day for nonpregnant women. One drink equals 12 ounces of beer, 5 ounces of wine, or 1 ounces of hard liquor.  Do not use street drugs.  Do not share needles.  Ask your health care provider for help if you need support or information about quitting drugs.  Tell your health care provider if you often feel depressed.  Tell your health care provider if you have ever been abused or do not feel safe at home.   This information is not intended to replace advice given to you by your health care provider. Make sure you discuss any questions you have with your health care provider.   Document Released: 02/21/2011 Document Revised: 08/29/2014 Document Reviewed: 07/10/2013 Elsevier Interactive Patient Education [employer].

## 2015-08-12 ENCOUNTER — Encounter: Payer: Self-pay | Admitting: Psychiatry

## 2015-08-12 ENCOUNTER — Ambulatory Visit (INDEPENDENT_AMBULATORY_CARE_PROVIDER_SITE_OTHER): Payer: 59 | Admitting: Psychiatry

## 2015-08-12 VITALS — BP 142/78 | HR 118 | Temp 97.6°F | Ht 65.0 in | Wt 232.6 lb

## 2015-08-12 DIAGNOSIS — F32A Depression, unspecified: Secondary | ICD-10-CM

## 2015-08-12 DIAGNOSIS — F411 Generalized anxiety disorder: Secondary | ICD-10-CM | POA: Diagnosis not present

## 2015-08-12 DIAGNOSIS — F329 Major depressive disorder, single episode, unspecified: Secondary | ICD-10-CM | POA: Diagnosis not present

## 2015-08-12 MED ORDER — BUPROPION HCL ER (XL) 300 MG PO TB24: 300.0000 mg | ORAL_TABLET | Freq: Every day | ORAL | Status: DC

## 2015-08-12 MED ORDER — ALPRAZOLAM 0.5 MG PO TABS: 0.5000 mg | ORAL_TABLET | Freq: Four times a day (QID) | ORAL | Status: DC | PRN

## 2015-08-12 MED ORDER — DULOXETINE HCL 30 MG PO CPEP: 30.0000 mg | ORAL_CAPSULE | Freq: Every day | ORAL | Status: DC

## 2015-08-12 MED ORDER — TRAZODONE HCL 100 MG PO TABS: 100.0000 mg | ORAL_TABLET | Freq: Every evening | ORAL | Status: DC | PRN

## 2015-08-12 NOTE — Progress Notes (Signed)
[facility] MD/PA/NP OP Progress Note  08/12/2015 4:20 PM Amanda Vasquez  MRN:  [id number]  Subjective:  Patient presents for follow-up of her generalized anxiety disorder, other specified depressive disorder and insomnia. Patient indicates she has not noticed any benefit from the Lexapro and also believe she's gained about 8 pounds since being on it. His discussed that given these issues it might make sense to try another medication. She has had a past trial of Prozac in which she also reports she may have gained weight and she now has had the same issue with Lexapro. Thus I did discuss trying agent and another family. He indicates she does have some burning pain on one side of her body and thus we discussed that Cymbalta might be a reasonable option. In case she should largely using the alprazolam at bedtime to help her relax so she can go to sleep. She states she's not using it for sleep agent but it does help relax her so she can go to sleep and ultimately finds that the trazodone has been helpful. However she states that now the alprazolam she is unable to relax to even lie down in the bed.   Chief Complaint: gained weight Chief Complaint    Follow-up; Medication Refill     Visit Diagnosis:     ICD-9-CM ICD-10-CM   1. Generalized anxiety disorder 300.02 F41.1   2. Depression 311 F32.9     Past Medical History:  Past Medical History  Diagnosis Date  . ALLERGIC RHINITIS   . ANXIETY   . PARESTHESIA   . OBESITY   . AMENORRHEA   . DISORDER, BIPOLAR, ATYPICAL MANIC   . DYSLIPIDEMIA   . Thalamic pain syndrome (hyperesthetic)     follows with Dr Hardin Negus for same, on disability since 09/2011 for same  . Migraine   . Demyelinating disorder ([place])   . Central pain syndrome   . Diabetes mellitus, type II [facility])     Past Surgical History  Procedure Laterality Date  . Breast surgery  1997, 2006    Reduction  . Cholecystectomy  1998  . Intrauterine device insertion  2009  . Knee surgery Right  1993   Family History:  Family History  Problem Relation Age of Onset  . Adopted: Yes   Social History:  Social History   Social History  . Marital Status: Divorced    Spouse Name: N/A  . Number of Children: 0  . Years of Education: 12+   Occupational History  . RN- long-term disability     Social History Main Topics  . Smoking status: Former Smoker    Types: Cigarettes, Cigars    Start date: 06/08/1986    Quit date: 08/22/2008  . Smokeless tobacco: Never Used  . Alcohol Use: No     Comment: Quit drinking in 2010  . Drug Use: No  . Sexual Activity: Yes    Birth Control/ Protection: IUD   Other Topics Concern  . None   Social History Narrative   Divorced. Lives at home with herself.   Caffeine use: 1 cup coffee 3 times per week   Additional History:   Assessment:   Musculoskeletal: Strength & Muscle Tone: within normal limits Gait & Station: normal Patient leans: N/A  Psychiatric Specialty Exam: HPI  Review of Systems  Psychiatric/Behavioral: Negative for depression, suicidal ideas, hallucinations, memory loss and substance abuse. The patient is nervous/anxious and has insomnia (Sleep onset, however she is able to sleep once she falls asleep.).  All other systems reviewed and are negative.   Blood pressure 142/78, pulse 118, temperature 97.6 F (36.4 C), temperature source Tympanic, height 5\' 5"  (1.651 m), weight 232 lb 9.6 oz (105.507 kg), last menstrual period 08/02/2015, SpO2 93 %.Body mass index is 38.71 kg/(m^2).  General Appearance: Well Groomed  Eye Contact:  Good  Speech:  Normal Rate  Volume:  Normal  Mood:  Okay  Affect:  Constricted  Thought Process:  Linear  Orientation:  Full (Time, Place, and Person)  Thought Content:  Negative  Suicidal Thoughts:  No  Homicidal Thoughts:  No  Memory:  Immediate;   Good Recent;   Good Remote;   Good  Judgement:  Good  Insight:  Good  Psychomotor Activity:  Negative  Concentration:  Good  Recall:   Good  Fund of Knowledge: Good  Language: Good  Akathisia:  Negative  Handed:    AIMS (if indicated):    Assets:  Communication Skills Desire for Improvement  ADL's:  Intact  Cognition: WNL  Sleep:  Poor sleep onset but good otherwise    Is the patient at risk to self?  No. Has the patient been a risk to self in the past 6 months?  No. Has the patient been a risk to self within the distant past?  No. Is the patient a risk to others?  No. Has the patient been a risk to others in the past 6 months?  No. Has the patient been a risk to others within the distant past?  No.  Current Medications: Current Outpatient Prescriptions  Medication Sig Dispense Refill  . ALPRAZolam (XANAX) 0.5 MG tablet Take 1 tablet (0.5 mg total) by mouth 4 (four) times daily as needed for anxiety. 120 tablet 4  . aspirin 81 MG tablet Take 81 mg by mouth daily.    [name] atorvastatin (LIPITOR) 10 MG tablet Take 1 tablet (10 mg total) by mouth daily. 90 tablet 3  . b complex vitamins tablet Take 1 tablet by mouth daily.      . baclofen (LIORESAL) 10 MG tablet Take 1 tablet (10 mg total) by mouth 3 (three) times daily. 30 each   . buPROPion (WELLBUTRIN XL) 300 MG 24 hr tablet Take 1 tablet (300 mg total) by mouth daily. Take 300 mg by mouth daily. 30 tablet 4  . Cetirizine HCl 10 MG CAPS Take 1 capsule (10 mg total) by mouth daily as needed. Take by mouth. 30 capsule   . Cholecalciferol (VITAMIN D3) 2000 UNITS TABS Take 500 Units by mouth daily.     . Exenatide ER 2 MG PEN Inject 2 mg into the skin once a week. 3 each 4  . glipiZIDE (GLUCOTROL) 5 MG tablet Take 1 tablet (5 mg total) by mouth daily. 90 tablet 3  . glucose blood test strip Use as instructed 100 each 12  . GRALISE 600 MG TABS Take 1,800 mg by mouth daily.    . magnesium sulfate (EPSOM SALT) GRAN 1 bath 2-3 times a week    . metFORMIN (GLUCOPHAGE) 1000 MG tablet Take 1 tablet (1,000 mg total) by mouth 2 (two) times daily with a meal. 180 tablet 0  .  morphine (MS CONTIN) 60 MG 12 hr tablet Take 60 mg by mouth every 12 (twelve) hours.    . multivitamin-iron-minerals-folic acid (CENTRUM) chewable tablet Chew 1 tablet by mouth daily.    [name] NEEDLE, DISP, 25 G (BD ECLIPSE) 25G X 1-1/2" MISC Use to administer vitamin b12 monthly 12  each 0  . ondansetron (ZOFRAN) 4 MG tablet Take as needed for nausea    . oxyCODONE (OXY IR/ROXICODONE) 5 MG immediate release tablet Take 1 tablet (5 mg total) by mouth every 4 (four) hours as needed for severe pain. Per pain clinic Dr Hardin Negus 30 tablet   . polyethylene glycol (MIRALAX / GLYCOLAX) packet Take 17 g by mouth daily as needed.    [name] POTASSIUM CHLORIDE PO Take 198 mg by mouth daily.    . valACYclovir (VALTREX) 1000 MG tablet TAKE 1 TABLET EVERY DAY AS NEEDED 60 tablet 0  . DULoxetine (CYMBALTA) 30 MG capsule Take 1 capsule (30 mg total) by mouth daily. 30 capsule 1  . traZODone (DESYREL) 100 MG tablet Take 1 tablet (100 mg total) by mouth at bedtime as needed for sleep. 30 tablet 1   No current facility-administered medications for this visit.    Medical Decision Making:  Established Problem, Stable/Improving (1), Review of Medication Regimen & Side Effects (2) and Review of New Medication or Change in Dosage (2)  Treatment Plan Summary:Medication management and Plan  Medication management and Plan Other Specified depressive disorder- it appears that her depression is relatively stable at this time. Thus we'll continue her Wellbutrin XL at 300 mg daily.  Generalized anxiety disorder-patient reports anxiety about her own health and pain issues. Regards start some Cymbalta 30 mg daily. Risk and benefits of been discussing patient's able consent. Will increase her alprazolam from 0.5 mg 3 times a day to 0.5 mg 4 times a day as needed for anxiety.  Insomnia-continue  trazodone 100 mg at bedtime. Patient reports good benefit however sleep onset tends to be a problem, ever once asleep she states she can sleep 9  hours.   Patient will follow up in 1 month. I've explained my departure from the practice in February and that she would transition to another provider within the [facility] system. She's been encouraged call with questions concerns prior to her next point.  Faith Rogue 08/12/2015, 4:20 PM

## 2015-08-19 NOTE — Progress Notes (Signed)
Error

## 2015-08-21 ENCOUNTER — Telehealth: Payer: Self-pay | Admitting: Internal Medicine

## 2015-08-21 NOTE — Telephone Encounter (Signed)
Left msg for pt to call back. Need to know if [facility] is doing her MRI.

## 2015-08-21 NOTE — Telephone Encounter (Signed)
Noted! Thank you

## 2015-08-21 NOTE — Telephone Encounter (Signed)
Pt called you back. She wanted to let you know she has an MRI scheduled tomorrow with [facility]

## 2015-09-08 ENCOUNTER — Other Ambulatory Visit: Payer: Self-pay

## 2015-09-08 ENCOUNTER — Other Ambulatory Visit: Payer: Self-pay | Admitting: Internal Medicine

## 2015-09-08 MED ORDER — DULOXETINE HCL 30 MG PO CPEP: 30.0000 mg | ORAL_CAPSULE | Freq: Every day | ORAL | Status: DC

## 2015-09-08 NOTE — Telephone Encounter (Signed)
received a notice for a refill on duloxetine hcl dr 30mg  pt last seen on  08-12-15 next appt  09-16-15 pt will be about 4 day short having medications.

## 2015-09-09 ENCOUNTER — Telehealth: Payer: Self-pay

## 2015-09-09 NOTE — Telephone Encounter (Signed)
PA started on 09/08/2015  KEY: [id number]  Clinical data submitted on 09/09/15

## 2015-09-11 ENCOUNTER — Telehealth: Payer: Self-pay

## 2015-09-11 NOTE — Telephone Encounter (Signed)
PA initiated via covermymeds. Key for PA is [facility]

## 2015-09-11 NOTE — Telephone Encounter (Signed)
PA denied.  Must try victoza first.  REF [id number].  Will also fax over and will call patient to notify of denial.

## 2015-09-11 NOTE — Telephone Encounter (Signed)
[facility] called to follow up. I told her we're waiting for providers response. She asked that we send a new prescription for the victoza if that is what she wants to prescribe

## 2015-09-11 NOTE — Telephone Encounter (Signed)
The key below was cancelled.   New PA started.

## 2015-09-11 NOTE — Telephone Encounter (Signed)
Please see below and advise.

## 2015-09-14 ENCOUNTER — Encounter: Payer: Self-pay | Admitting: *Deleted

## 2015-09-14 ENCOUNTER — Other Ambulatory Visit: Payer: Self-pay

## 2015-09-14 NOTE — Progress Notes (Signed)
Faxed surgery clearance per Dr Jaynee Eagles back to [facility]. Received fax confirmation. Fax: [phone]. Sent copy to MR.

## 2015-09-15 ENCOUNTER — Encounter: Payer: Self-pay | Admitting: Internal Medicine

## 2015-09-15 MED ORDER — LIRAGLUTIDE 18 MG/3ML ~~LOC~~ SOPN: 0.6000 mg | PEN_INJECTOR | Freq: Every day | SUBCUTANEOUS | Status: DC

## 2015-09-15 NOTE — Addendum Note (Signed)
Addended by: Karle Barr on: 09/15/2015 09:08 AM   Modules accepted: Orders, Medications

## 2015-09-15 NOTE — Telephone Encounter (Signed)
Hello Ms. Lynnae Sandhoff,  I am responding to this message in lieu of Dr. Asa Lente. Unfortunately Dr. Asa Lente will not receive mychart messages at this point. We do have two female MD's (Dr Pricilla Holm and Dr Billey Gosling) accepting patients here at the [facility] office. I am happy to get you established with one of them.   On that same subject, I left a voicemail for you a few moments ago regarding a surgical clearance we received from Dr. Aaron Edelman Swinteck's office. I have been asked to get you in for an appointment so that you can be evaluated and cleared for this surgery. Would you like to schedule an appointment to have this completed?   Thanks so much,  [employer]

## 2015-09-15 NOTE — Telephone Encounter (Signed)
LVM for pt to call back as soon as possible.  Victoza has been sent to [facility]  RE: Dr Sharlet Salina responded to refill request. PA for Bydureon was denied. Victoza was rx'ed instead.  Sig: Inject 0.1-0.2 mLs (0.6-1.2 mg total) into the skin daily. Week 1 take 0.6 mg daily, week 2 and after take 1.2 mg daily. - Subcutaneous

## 2015-09-16 ENCOUNTER — Ambulatory Visit (INDEPENDENT_AMBULATORY_CARE_PROVIDER_SITE_OTHER): Payer: BLUE CROSS/BLUE SHIELD | Admitting: Psychiatry

## 2015-09-16 ENCOUNTER — Encounter: Payer: Self-pay | Admitting: Psychiatry

## 2015-09-16 VITALS — BP 124/68 | HR 109 | Temp 99.1°F | Ht 65.0 in | Wt 232.6 lb

## 2015-09-16 DIAGNOSIS — F329 Major depressive disorder, single episode, unspecified: Secondary | ICD-10-CM | POA: Diagnosis not present

## 2015-09-16 DIAGNOSIS — F32A Depression, unspecified: Secondary | ICD-10-CM

## 2015-09-16 DIAGNOSIS — F411 Generalized anxiety disorder: Secondary | ICD-10-CM | POA: Diagnosis not present

## 2015-09-16 MED ORDER — TRAZODONE HCL 100 MG PO TABS: 100.0000 mg | ORAL_TABLET | Freq: Every evening | ORAL | Status: DC | PRN

## 2015-09-16 MED ORDER — DULOXETINE HCL 30 MG PO CPEP: 30.0000 mg | ORAL_CAPSULE | Freq: Every day | ORAL | Status: DC

## 2015-09-16 NOTE — Progress Notes (Signed)
[place] MD/PA/NP OP Progress Note  09/16/2015 2:26 PM Amanda Vasquez  MRN:  [id number]  Subjective:  Patient presents for follow-up of her generalized anxiety disorder, other specified depressive disorder and insomnia. At the last visit we started Cymbalta. Patient feels like the Cymbalta has been helpful for her depression. She states the alprazolam continues to be effective for her anxiety. Overall she feels good and feels like the current medication combination is working.  An upcoming stressors that she is going to have knee surgery on her left knee to repair a meniscus. She states she will need to be in therapy after the surgery. However she states she is waiting or medical clearance by several of her other providers.   Chief Complaint: Doing good Chief Complaint    Follow-up; Medication Refill; Anxiety     Visit Diagnosis:     ICD-9-CM ICD-10-CM   1. Generalized anxiety disorder 300.02 F41.1   2. Depression 311 F32.9     Past Medical History:  Past Medical History  Diagnosis Date  . ALLERGIC RHINITIS   . ANXIETY   . PARESTHESIA   . OBESITY   . AMENORRHEA   . DISORDER, BIPOLAR, ATYPICAL MANIC   . DYSLIPIDEMIA   . Thalamic pain syndrome (hyperesthetic)     follows with Dr Hardin Negus for same, on disability since 09/2011 for same  . Migraine   . Demyelinating disorder ([place])   . Central pain syndrome   . Diabetes mellitus, type II ([place])   . Knee pain     Past Surgical History  Procedure Laterality Date  . Breast surgery  1997, 2006    Reduction  . Cholecystectomy  1998  . Intrauterine device insertion  2009  . Knee surgery Right 1993   Family History:  Family History  Problem Relation Age of Onset  . Adopted: Yes   Social History:  Social History   Social History  . Marital Status: Divorced    Spouse Name: N/A  . Number of Children: 0  . Years of Education: 12+   Occupational History  . RN- long-term disability     Social History Main Topics  . Smoking status:  Former Smoker    Types: Cigarettes, Cigars    Start date: 06/08/1986    Quit date: 08/22/2008  . Smokeless tobacco: Never Used  . Alcohol Use: No     Comment: Quit drinking in 2010  . Drug Use: No  . Sexual Activity: Yes    Birth Control/ Protection: IUD   Other Topics Concern  . None   Social History Narrative   Divorced. Lives at home with herself.   Caffeine use: 1 cup coffee 3 times per week   Additional History:   Assessment:   Musculoskeletal: Strength & Muscle Tone: within normal limits Gait & Station: normal Patient leans: N/A  Psychiatric Specialty Exam: Anxiety Patient reports no insomnia, nervous/anxious behavior or suicidal ideas.      Review of Systems  Psychiatric/Behavioral: Negative for depression, suicidal ideas, hallucinations, memory loss and substance abuse. The patient is not nervous/anxious and does not have insomnia.   All other systems reviewed and are negative.   Blood pressure 124/68, pulse 109, temperature 99.1 F (37.3 C), temperature source Tympanic, height 5\' 5"  (1.651 m), weight 232 lb 9.6 oz (105.507 kg), SpO2 91 %.Body mass index is 38.71 kg/(m^2).  General Appearance: Well Groomed  Eye Contact:  Good  Speech:  Normal Rate  Volume:  Normal  Mood:  Good  Affect:  bright, able to smile  Thought Process:  Linear  Orientation:  Full (Time, Place, and Person)  Thought Content:  Negative  Suicidal Thoughts:  No  Homicidal Thoughts:  No  Memory:  Immediate;   Good Recent;   Good Remote;   Good  Judgement:  Good  Insight:  Good  Psychomotor Activity:  Negative  Concentration:  Good  Recall:  Good  Fund of Knowledge: Good  Language: Good  Akathisia:  Negative  Handed:    AIMS (if indicated):    Assets:  Communication Skills Desire for Improvement  ADL's:  Intact  Cognition: WNL  Sleep:  Poor sleep onset but good otherwise    Is the patient at risk to self?  No. Has the patient been a risk to self in the past 6 months?   No. Has the patient been a risk to self within the distant past?  No. Is the patient a risk to others?  No. Has the patient been a risk to others in the past 6 months?  No. Has the patient been a risk to others within the distant past?  No.  Current Medications: Current Outpatient Prescriptions  Medication Sig Dispense Refill  . ALPRAZolam (XANAX) 0.5 MG tablet Take 1 tablet (0.5 mg total) by mouth 4 (four) times daily as needed for anxiety. 120 tablet 4  . aspirin 81 MG tablet Take 81 mg by mouth daily.    [name] atorvastatin (LIPITOR) 10 MG tablet Take 1 tablet (10 mg total) by mouth daily. 90 tablet 3  . b complex vitamins tablet Take 1 tablet by mouth daily.      . baclofen (LIORESAL) 10 MG tablet Take 1 tablet (10 mg total) by mouth 3 (three) times daily. 30 each   . buPROPion (WELLBUTRIN XL) 300 MG 24 hr tablet Take 1 tablet (300 mg total) by mouth daily. Take 300 mg by mouth daily. 30 tablet 4  . Cetirizine HCl 10 MG CAPS Take 1 capsule (10 mg total) by mouth daily as needed. Take by mouth. 30 capsule   . Cholecalciferol (VITAMIN D3) 2000 UNITS TABS Take 500 Units by mouth daily.     . DULoxetine (CYMBALTA) 30 MG capsule Take 1 capsule (30 mg total) by mouth daily. 30 capsule 4  . glipiZIDE (GLUCOTROL) 5 MG tablet Take 1 tablet (5 mg total) by mouth daily. 90 tablet 3  . glucose blood test strip Use as instructed 100 each 12  . GRALISE 600 MG TABS Take 1,800 mg by mouth daily.    . Liraglutide 18 MG/3ML SOPN Inject 0.1-0.2 mLs (0.6-1.2 mg total) into the skin daily. Week 1 take 0.6 mg daily, week 2 and after take 1.2 mg daily. 6 mL 3  . magnesium sulfate (EPSOM SALT) GRAN 1 bath 2-3 times a week    . metFORMIN (GLUCOPHAGE) 1000 MG tablet Take 1 tablet (1,000 mg total) by mouth 2 (two) times daily with a meal. 180 tablet 0  . morphine (MS CONTIN) 60 MG 12 hr tablet Take 60 mg by mouth every 12 (twelve) hours.    . multivitamin-iron-minerals-folic acid (CENTRUM) chewable tablet Chew 1 tablet  by mouth daily.    [name] NEEDLE, DISP, 25 G (BD ECLIPSE) 25G X 1-1/2" MISC Use to administer vitamin b12 monthly 12 each 0  . ondansetron (ZOFRAN) 4 MG tablet Take as needed for nausea    . oxyCODONE (OXY IR/ROXICODONE) 5 MG immediate release tablet Take 1 tablet (5 mg total) by mouth every 4 (  four) hours as needed for severe pain. Per pain clinic Dr Hardin Negus 30 tablet   . polyethylene glycol (MIRALAX / GLYCOLAX) packet Take 17 g by mouth daily as needed.    [name] POTASSIUM CHLORIDE PO Take 198 mg by mouth daily.    . traZODone (DESYREL) 100 MG tablet Take 1 tablet (100 mg total) by mouth at bedtime as needed for sleep. 30 tablet 4  . valACYclovir (VALTREX) 1000 MG tablet Take 1 tablet (1,000 mg total) by mouth daily as needed. Must estab w/new provider for future refills 60 tablet 2   No current facility-administered medications for this visit.    Medical Decision Making:  Established Problem, Stable/Improving (1), Review of Medication Regimen & Side Effects (2) and Review of New Medication or Change in Dosage (2)  Treatment Plan Summary:Medication management and Plan  Medication management and Plan Other Specified depressive disorder- it appears that her depression is relatively stable at this time. Thus we'll continue her Wellbutrin XL at 300 mg daily.  Generalized anxiety disorder-patient reports anxiety about her own health and pain issues. Continue Cymbalta 30 mg daily. Continue alprazolam 0.5 mg 4 times a day as needed for anxiety.  Insomnia-continue  trazodone 100 mg at bedtime. Patient reports good benefit however sleep onset tends to be a problem, ever once asleep she states she can sleep 9 hours.   Patient will follow up in 3 month. He is aware of my departure and she would transition to another provider within the [facility] system. She's been encouraged call with questions concerns prior to her next appointment.  Faith Rogue 09/16/2015, 2:26 PM

## 2015-09-17 ENCOUNTER — Telehealth: Payer: Self-pay | Admitting: Internal Medicine

## 2015-09-17 NOTE — Telephone Encounter (Signed)
LVM for pt to call back as soon as possible.   RE: below note

## 2015-09-17 NOTE — Telephone Encounter (Signed)
Pt stated that [employer] needs a letter stating that the victoza and bayad caused adverse side effects (nause and abdominal cramps) and did not affect her a1c.   Pt stated that bydureon did positively affect a1c and did not cause adverse side effect.   Pls advise.

## 2015-09-17 NOTE — Telephone Encounter (Signed)
Pt called stated she the PA for bydureon to be submitted to [facility] phone # [phone] to call or fill out the paper work that she will be faxing to you.

## 2015-09-17 NOTE — Telephone Encounter (Signed)
PCP last office note documents her reactions to various medication and states change to bydureon from victoza was ease of administration to once weekly. Would need visit to discuss and would recommend to start the victoza in the meantime.

## 2015-09-21 ENCOUNTER — Ambulatory Visit (INDEPENDENT_AMBULATORY_CARE_PROVIDER_SITE_OTHER): Payer: BLUE CROSS/BLUE SHIELD | Admitting: Family Medicine

## 2015-09-21 ENCOUNTER — Encounter: Payer: Self-pay | Admitting: Family Medicine

## 2015-09-21 VITALS — BP 116/70 | HR 106 | Temp 98.4°F | Wt 230.5 lb

## 2015-09-21 DIAGNOSIS — G890 Central pain syndrome: Secondary | ICD-10-CM | POA: Diagnosis not present

## 2015-09-21 DIAGNOSIS — E119 Type 2 diabetes mellitus without complications: Secondary | ICD-10-CM | POA: Diagnosis not present

## 2015-09-21 DIAGNOSIS — Z01810 Encounter for preprocedural cardiovascular examination: Secondary | ICD-10-CM | POA: Insufficient documentation

## 2015-09-21 DIAGNOSIS — E669 Obesity, unspecified: Secondary | ICD-10-CM

## 2015-09-21 DIAGNOSIS — E1169 Type 2 diabetes mellitus with other specified complication: Secondary | ICD-10-CM

## 2015-09-21 DIAGNOSIS — M25562 Pain in left knee: Secondary | ICD-10-CM

## 2015-09-21 DIAGNOSIS — F411 Generalized anxiety disorder: Secondary | ICD-10-CM

## 2015-09-21 DIAGNOSIS — G379 Demyelinating disease of central nervous system, unspecified: Secondary | ICD-10-CM

## 2015-09-21 NOTE — Assessment & Plan Note (Signed)
Complex medical history. >45 minutes spent in face to face time with patient, >50% spent in counselling or coordination of care, reviewing chart, discussing medical history and surgical clearance with pt.

## 2015-09-21 NOTE — Telephone Encounter (Signed)
Forwarding message to new PCP.   Dr. Deborra Medina,  Can I send the PA fax to your CMA or the paper work CMA (if applicable)?

## 2015-09-21 NOTE — Progress Notes (Signed)
Pre visit review using our clinic review tool, if applicable. No additional management support is needed unless otherwise documented below in the visit note. 

## 2015-09-21 NOTE — Telephone Encounter (Signed)
Pt told me in office today that she can only take bydureon as victoza did not control her blood sugar and she could not tolerate others.  That was all we addressed about her diabetes since she thought you all were working on her PA.  She is scheduled to come see me for her diabetes management for the first time in a couple of months.

## 2015-09-21 NOTE — Assessment & Plan Note (Signed)
EKG reassuring. No previous problems with anesthesia or intubation. Surgical Clearance provided.

## 2015-09-21 NOTE — Telephone Encounter (Signed)
Pt checking on the status of the PA for this medication and she sent the assistant a form on Friday 09/18/15, just making sure we got everything we need for get this approve. Please give her a call  [phone]

## 2015-09-21 NOTE — Progress Notes (Signed)
Subjective:   Patient ID: NICKCOLE BLASBERG, female    DOB: Nov 10, 1970, 45 y.o.   MRN: [id number]  JETTY EPPLE is a pleasant 45 y.o. year old female who presents to clinic today with surgical clearance  on 09/21/2015  HPI:  She has not yet established care in our office.  Has been seeing Dr. Asa Lente at [facility].  Was told she had to be seen by someone in our office ASAP for surgical clearance.  Very complicated medical history.  Per pt, having knee surgery and was already "cleared" for surgery by her neurologist.  Chart reviewed.  Last saw Dr. Asa Lente on 08/11/15 for CPX. Multiple tried and failed diabetes rxs due to poor control and intolerance. Currently awaiting prior auth for bydureon. also takes Metformin 1000 mg twice daily and Glucotrol 5 mg daily.  Lab Results  Component Value Date   HGBA1C 7.0* 08/11/2015     Followed by psychiatry (Dr. Jimmye Norman) for GAD and depression.  Last seen recently on 09/16/15.  Note reviewed. Advised to continue current dose of Wellbutrin and Cymbalta and increased Xanax to 0.5 mg four times daily as needed for anxiety.  Trazodone was continued as well for insomnia.  Has follow up in 3 months.  Thalamic pain syndrome and demyelinating disease- followed by neurology, Dr. Jaynee Eagles.  Last seen on 04/06/15- note reviewed. Advised to she follow up with her pain management specialist.  Current Outpatient Prescriptions on File Prior to Visit  Medication Sig Dispense Refill  . ALPRAZolam (XANAX) 0.5 MG tablet Take 1 tablet (0.5 mg total) by mouth 4 (four) times daily as needed for anxiety. 120 tablet 4  . aspirin 81 MG tablet Take 81 mg by mouth daily.    [name] atorvastatin (LIPITOR) 10 MG tablet Take 1 tablet (10 mg total) by mouth daily. 90 tablet 3  . b complex vitamins tablet Take 1 tablet by mouth daily.      . baclofen (LIORESAL) 10 MG tablet Take 1 tablet (10 mg total) by mouth 3 (three) times daily. 30 each   . buPROPion (WELLBUTRIN XL) 300 MG 24 hr tablet  Take 1 tablet (300 mg total) by mouth daily. Take 300 mg by mouth daily. 30 tablet 4  . Cetirizine HCl 10 MG CAPS Take 1 capsule (10 mg total) by mouth daily as needed. Take by mouth. 30 capsule   . Cholecalciferol (VITAMIN D3) 2000 UNITS TABS Take 500 Units by mouth daily.     . DULoxetine (CYMBALTA) 30 MG capsule Take 1 capsule (30 mg total) by mouth daily. 30 capsule 4  . glipiZIDE (GLUCOTROL) 5 MG tablet Take 1 tablet (5 mg total) by mouth daily. 90 tablet 3  . glucose blood test strip Use as instructed 100 each 12  . GRALISE 600 MG TABS Take 1,800 mg by mouth daily.    . Liraglutide 18 MG/3ML SOPN Inject 0.1-0.2 mLs (0.6-1.2 mg total) into the skin daily. Week 1 take 0.6 mg daily, week 2 and after take 1.2 mg daily. 6 mL 3  . magnesium sulfate (EPSOM SALT) GRAN 1 bath 2-3 times a week    . metFORMIN (GLUCOPHAGE) 1000 MG tablet Take 1 tablet (1,000 mg total) by mouth 2 (two) times daily with a meal. 180 tablet 0  . morphine (MS CONTIN) 60 MG 12 hr tablet Take 60 mg by mouth every 12 (twelve) hours.    . multivitamin-iron-minerals-folic acid (CENTRUM) chewable tablet Chew 1 tablet by mouth daily.    [name] NEEDLE,  DISP, 25 G (BD ECLIPSE) 25G X 1-1/2" MISC Use to administer vitamin b12 monthly 12 each 0  . ondansetron (ZOFRAN) 4 MG tablet Take as needed for nausea    . oxyCODONE (OXY IR/ROXICODONE) 5 MG immediate release tablet Take 1 tablet (5 mg total) by mouth every 4 (four) hours as needed for severe pain. Per pain clinic Dr Hardin Negus 30 tablet   . polyethylene glycol (MIRALAX / GLYCOLAX) packet Take 17 g by mouth daily as needed.    [name] POTASSIUM CHLORIDE PO Take 198 mg by mouth daily.    . traZODone (DESYREL) 100 MG tablet Take 1 tablet (100 mg total) by mouth at bedtime as needed for sleep. 30 tablet 4  . valACYclovir (VALTREX) 1000 MG tablet Take 1 tablet (1,000 mg total) by mouth daily as needed. Must estab w/new provider for future refills 60 tablet 2   No current facility-administered  medications on file prior to visit.    Allergies  Allergen Reactions  . Latex   . Prednisone     Past Medical History  Diagnosis Date  . ALLERGIC RHINITIS   . ANXIETY   . PARESTHESIA   . OBESITY   . AMENORRHEA   . DISORDER, BIPOLAR, ATYPICAL MANIC   . DYSLIPIDEMIA   . Thalamic pain syndrome (hyperesthetic)     follows with Dr Hardin Negus for same, on disability since 09/2011 for same  . Migraine   . Demyelinating disorder ([place])   . Central pain syndrome   . Diabetes mellitus, type II ([place])   . Knee pain     Past Surgical History  Procedure Laterality Date  . Breast surgery  1997, 2006    Reduction  . Cholecystectomy  1998  . Intrauterine device insertion  2009  . Knee surgery Right 1993    Family History  Problem Relation Age of Onset  . Adopted: Yes    Social History   Social History  . Marital Status: Divorced    Spouse Name: N/A  . Number of Children: 0  . Years of Education: 12+   Occupational History  . RN- long-term disability     Social History Main Topics  . Smoking status: Former Smoker    Types: Cigarettes, Cigars    Start date: 06/08/1986    Quit date: 08/22/2008  . Smokeless tobacco: Never Used  . Alcohol Use: No     Comment: Quit drinking in 2010  . Drug Use: No  . Sexual Activity: Yes    Birth Control/ Protection: IUD   Other Topics Concern  . Not on file   Social History Narrative   Divorced. Lives at home with herself.   Caffeine use: 1 cup coffee 3 times per week   The PMH, PSH, Social History, Family History, Medications, and allergies have been reviewed in [facility], and have been updated if relevant.    Review of Systems     Objective:    BP 116/70 mmHg  Pulse 106  Temp(Src) 98.4 F (36.9 C) (Oral)  Wt 230 lb 8 oz (104.554 kg)  SpO2 93%   Physical Exam        Assessment & Plan:   Pre-operative cardiovascular examination - Plan: EKG 12-Lead  Diabetes mellitus type 2 in obese ([name])  Left knee pain  Thalamic  pain syndrome  Demyelinating disease of central nervous system ([name])  Anxiety state No Follow-up on file.

## 2015-09-21 NOTE — Patient Instructions (Signed)
Great to meet you. Please come see me for your 3 month diabetes follow up.

## 2015-09-22 NOTE — Telephone Encounter (Signed)
Called pt back.  Need the fax for tier exception instead of quantity limit exception.   Called [employer] prior authorization line. They were not able to pull up drug name Bydureon.  PA form filled out and given to MD to sign.

## 2015-09-22 NOTE — Telephone Encounter (Signed)
MD is not able to sign PA for Victoza. Per last OV with [facility] Victoza was effective but Bydureon had an easier administration (weekly) as opposed to daily (as with Victoza).   Forwarding to RN team lead.

## 2015-09-26 ENCOUNTER — Ambulatory Visit: Payer: Self-pay | Admitting: Orthopedic Surgery

## 2015-10-06 ENCOUNTER — Encounter (HOSPITAL_BASED_OUTPATIENT_CLINIC_OR_DEPARTMENT_OTHER): Payer: Self-pay | Admitting: *Deleted

## 2015-10-06 NOTE — Progress Notes (Addendum)
NPO AFTER MN.  ARRIVE AT 0600.  NEEDS ISTAT AND URINE PREG.  CURRENT EKG IN CHART AND EPIC. WILL TAKE WELLBUTRIN, CYMBALTA, MS CONTIN, AND BACLOFEN AM DOS W/ SIPS OF WATER.

## 2015-10-07 NOTE — Telephone Encounter (Signed)
PA initiated via [employer]

## 2015-10-07 NOTE — Telephone Encounter (Signed)
Pt called requesting status of this PA - she is completely out of medication. I advised pt that 1 sample pen will be available for her to pick up (Pt will come Friday 02/17, placed in A side fridge) while I check on this request for her

## 2015-10-08 NOTE — Telephone Encounter (Signed)
PA denied, pt advised in detail via personal VM and told to follow up with new PCP ASAP

## 2015-10-09 ENCOUNTER — Encounter (HOSPITAL_BASED_OUTPATIENT_CLINIC_OR_DEPARTMENT_OTHER): Payer: Self-pay

## 2015-10-09 ENCOUNTER — Ambulatory Visit (HOSPITAL_BASED_OUTPATIENT_CLINIC_OR_DEPARTMENT_OTHER)
Admission: RE | Admit: 2015-10-09 | Discharge: 2015-10-09 | Disposition: A | Discharge status: Home / Self Care | Payer: BLUE CROSS/BLUE SHIELD | Source: Ambulatory Visit | Attending: Orthopedic Surgery | Admitting: Orthopedic Surgery

## 2015-10-09 ENCOUNTER — Ambulatory Visit (HOSPITAL_BASED_OUTPATIENT_CLINIC_OR_DEPARTMENT_OTHER): Payer: BLUE CROSS/BLUE SHIELD | Admitting: Anesthesiology

## 2015-10-09 ENCOUNTER — Encounter (HOSPITAL_BASED_OUTPATIENT_CLINIC_OR_DEPARTMENT_OTHER)
Admission: RE | Disposition: A | Discharge status: Home / Self Care | Payer: Self-pay | Source: Ambulatory Visit | Attending: Orthopedic Surgery

## 2015-10-09 DIAGNOSIS — X58XXXA Exposure to other specified factors, initial encounter: Secondary | ICD-10-CM | POA: Diagnosis not present

## 2015-10-09 DIAGNOSIS — S83232A Complex tear of medial meniscus, current injury, left knee, initial encounter: Secondary | ICD-10-CM | POA: Insufficient documentation

## 2015-10-09 DIAGNOSIS — Z7984 Long term (current) use of oral hypoglycemic drugs: Secondary | ICD-10-CM | POA: Insufficient documentation

## 2015-10-09 DIAGNOSIS — Z79899 Other long term (current) drug therapy: Secondary | ICD-10-CM | POA: Insufficient documentation

## 2015-10-09 DIAGNOSIS — F1120 Opioid dependence, uncomplicated: Secondary | ICD-10-CM | POA: Insufficient documentation

## 2015-10-09 DIAGNOSIS — Z87891 Personal history of nicotine dependence: Secondary | ICD-10-CM | POA: Insufficient documentation

## 2015-10-09 DIAGNOSIS — S83242A Other tear of medial meniscus, current injury, left knee, initial encounter: Secondary | ICD-10-CM | POA: Diagnosis present

## 2015-10-09 DIAGNOSIS — E785 Hyperlipidemia, unspecified: Secondary | ICD-10-CM | POA: Insufficient documentation

## 2015-10-09 DIAGNOSIS — G379 Demyelinating disease of central nervous system, unspecified: Secondary | ICD-10-CM | POA: Diagnosis not present

## 2015-10-09 DIAGNOSIS — F319 Bipolar disorder, unspecified: Secondary | ICD-10-CM | POA: Insufficient documentation

## 2015-10-09 DIAGNOSIS — G8929 Other chronic pain: Secondary | ICD-10-CM | POA: Diagnosis not present

## 2015-10-09 DIAGNOSIS — Z7982 Long term (current) use of aspirin: Secondary | ICD-10-CM | POA: Diagnosis not present

## 2015-10-09 DIAGNOSIS — E119 Type 2 diabetes mellitus without complications: Secondary | ICD-10-CM | POA: Insufficient documentation

## 2015-10-09 HISTORY — PX: KNEE ARTHROSCOPY WITH MEDIAL MENISECTOMY: SHX5651

## 2015-10-09 HISTORY — DX: Generalized anxiety disorder: F41.1

## 2015-10-09 HISTORY — DX: Allergic rhinitis, unspecified: J30.9

## 2015-10-09 HISTORY — DX: Personal history of other diseases of the digestive system: Z87.19

## 2015-10-09 HISTORY — DX: Personal history of colonic polyps: Z86.010

## 2015-10-09 HISTORY — DX: Weakness: R53.1

## 2015-10-09 HISTORY — DX: Other constipation: K59.09

## 2015-10-09 HISTORY — DX: Presence of spectacles and contact lenses: Z97.3

## 2015-10-09 HISTORY — DX: Hyperlipidemia, unspecified: E78.5

## 2015-10-09 HISTORY — DX: Unspecified tear of unspecified meniscus, current injury, left knee, initial encounter: S83.207A

## 2015-10-09 LAB — POCT I-STAT 4, (NA,K, GLUC, HGB,HCT)
Glucose, Bld: 246 mg/dL — ABNORMAL HIGH (ref 65–99)
HCT: 36 % (ref 36.0–46.0)
Hemoglobin: 12.2 g/dL (ref 12.0–15.0)
Potassium: 4.1 mmol/L (ref 3.5–5.1)
Sodium: 140 mmol/L (ref 135–145)

## 2015-10-09 LAB — GLUCOSE, CAPILLARY: Glucose-Capillary: 173 mg/dL — ABNORMAL HIGH (ref 65–99)

## 2015-10-09 SURGERY — ARTHROSCOPY, KNEE, WITH MEDIAL MENISCECTOMY
Anesthesia: General | Site: Knee | Laterality: Left

## 2015-10-09 MED ORDER — PROPOFOL 500 MG/50ML IV EMUL
INTRAVENOUS | Status: DC | PRN
  Administered 2015-10-09: 150 mL via INTRAVENOUS
  Administered 2015-10-09: 50 mL via INTRAVENOUS

## 2015-10-09 MED ORDER — PROPOFOL 500 MG/50ML IV EMUL
INTRAVENOUS | Status: AC
  Filled 2015-10-09: qty 50

## 2015-10-09 MED ORDER — ONDANSETRON HCL 4 MG PO TABS
4.0000 mg | ORAL_TABLET | Freq: Four times a day (QID) | ORAL | Status: DC | PRN
  Filled 2015-10-09: qty 1

## 2015-10-09 MED ORDER — METOCLOPRAMIDE HCL 5 MG/ML IJ SOLN
5.0000 mg | Freq: Three times a day (TID) | INTRAMUSCULAR | Status: DC | PRN
  Filled 2015-10-09: qty 2

## 2015-10-09 MED ORDER — MIDAZOLAM HCL 2 MG/2ML IJ SOLN
INTRAMUSCULAR | Status: AC
  Filled 2015-10-09: qty 2

## 2015-10-09 MED ORDER — ONDANSETRON HCL 4 MG/2ML IJ SOLN
INTRAMUSCULAR | Status: DC | PRN
  Administered 2015-10-09: 4 mg via INTRAVENOUS

## 2015-10-09 MED ORDER — SODIUM CHLORIDE 0.9 % IV SOLN
INTRAVENOUS | Status: DC
  Filled 2015-10-09: qty 1000

## 2015-10-09 MED ORDER — FENTANYL CITRATE (PF) 100 MCG/2ML IJ SOLN
INTRAMUSCULAR | Status: AC
  Filled 2015-10-09: qty 2

## 2015-10-09 MED ORDER — FENTANYL CITRATE (PF) 100 MCG/2ML IJ SOLN
100.0000 ug | Freq: Once | INTRAMUSCULAR | Status: AC
  Administered 2015-10-09: 100 ug via INTRAVENOUS
  Filled 2015-10-09: qty 2

## 2015-10-09 MED ORDER — KETOROLAC TROMETHAMINE 30 MG/ML IJ SOLN
INTRAMUSCULAR | Status: AC
  Filled 2015-10-09: qty 1

## 2015-10-09 MED ORDER — LIDOCAINE-EPINEPHRINE 1 %-1:100000 IJ SOLN
INTRAMUSCULAR | Status: DC | PRN
  Administered 2015-10-09: 20 mL

## 2015-10-09 MED ORDER — ASPIRIN EC 325 MG PO TBEC: 325.0000 mg | DELAYED_RELEASE_TABLET | Freq: Two times a day (BID) | ORAL | Status: DC

## 2015-10-09 MED ORDER — CHLORHEXIDINE GLUCONATE 4 % EX LIQD
60.0000 mL | Freq: Once | CUTANEOUS | Status: DC
  Filled 2015-10-09: qty 60

## 2015-10-09 MED ORDER — HYDROMORPHONE HCL 1 MG/ML IJ SOLN
INTRAMUSCULAR | Status: AC
  Filled 2015-10-09: qty 1

## 2015-10-09 MED ORDER — FENTANYL CITRATE (PF) 100 MCG/2ML IJ SOLN
INTRAMUSCULAR | Status: DC | PRN
  Administered 2015-10-09 (×4): 50 ug via INTRAVENOUS

## 2015-10-09 MED ORDER — MIDAZOLAM HCL 5 MG/5ML IJ SOLN
INTRAMUSCULAR | Status: DC | PRN
  Administered 2015-10-09: 1 mg via INTRAVENOUS

## 2015-10-09 MED ORDER — CEFAZOLIN SODIUM-DEXTROSE 2-3 GM-% IV SOLR
INTRAVENOUS | Status: AC
  Filled 2015-10-09: qty 50

## 2015-10-09 MED ORDER — PROMETHAZINE HCL 25 MG/ML IJ SOLN
6.2500 mg | INTRAMUSCULAR | Status: DC | PRN
  Filled 2015-10-09: qty 1

## 2015-10-09 MED ORDER — OXYCODONE HCL 5 MG PO TABS
5.0000 mg | ORAL_TABLET | ORAL | Status: DC | PRN
  Filled 2015-10-09: qty 2

## 2015-10-09 MED ORDER — DEXAMETHASONE SODIUM PHOSPHATE 10 MG/ML IJ SOLN
INTRAMUSCULAR | Status: AC
  Filled 2015-10-09: qty 1

## 2015-10-09 MED ORDER — BUPIVACAINE-EPINEPHRINE (PF) 0.5% -1:200000 IJ SOLN
INTRAMUSCULAR | Status: DC | PRN
  Administered 2015-10-09: 20 mL via PERINEURAL

## 2015-10-09 MED ORDER — MIDAZOLAM HCL 2 MG/2ML IJ SOLN
2.0000 mg | Freq: Once | INTRAMUSCULAR | Status: AC
  Administered 2015-10-09: 2 mg via INTRAVENOUS
  Filled 2015-10-09: qty 2

## 2015-10-09 MED ORDER — ONDANSETRON HCL 4 MG/2ML IJ SOLN
INTRAMUSCULAR | Status: AC
  Filled 2015-10-09: qty 2

## 2015-10-09 MED ORDER — LACTATED RINGERS IV SOLN
INTRAVENOUS | Status: DC
  Administered 2015-10-09 (×2): via INTRAVENOUS
  Filled 2015-10-09: qty 1000

## 2015-10-09 MED ORDER — ONDANSETRON HCL 4 MG PO TABS: 4.0000 mg | ORAL_TABLET | Freq: Three times a day (TID) | ORAL | Status: DC | PRN

## 2015-10-09 MED ORDER — SODIUM CHLORIDE 0.9 % IR SOLN
Status: DC | PRN
  Administered 2015-10-09: 3000 mL

## 2015-10-09 MED ORDER — BUPIVACAINE-EPINEPHRINE 0.25% -1:200000 IJ SOLN
INTRAMUSCULAR | Status: DC | PRN
  Administered 2015-10-09: 30 mL

## 2015-10-09 MED ORDER — METHOCARBAMOL 500 MG PO TABS
500.0000 mg | ORAL_TABLET | Freq: Four times a day (QID) | ORAL | Status: DC | PRN
  Filled 2015-10-09: qty 1

## 2015-10-09 MED ORDER — FAMOTIDINE 20 MG PO TABS: 20.0000 mg | ORAL_TABLET | Freq: Two times a day (BID) | ORAL | Status: DC

## 2015-10-09 MED ORDER — LIDOCAINE HCL (CARDIAC) 20 MG/ML IV SOLN
INTRAVENOUS | Status: DC | PRN
  Administered 2015-10-09: 70 mg via INTRAVENOUS

## 2015-10-09 MED ORDER — METOCLOPRAMIDE HCL 5 MG PO TABS
5.0000 mg | ORAL_TABLET | Freq: Three times a day (TID) | ORAL | Status: DC | PRN
  Filled 2015-10-09: qty 2

## 2015-10-09 MED ORDER — LACTATED RINGERS IV SOLN
INTRAVENOUS | Status: DC
  Filled 2015-10-09: qty 1000

## 2015-10-09 MED ORDER — CEFAZOLIN SODIUM-DEXTROSE 2-3 GM-% IV SOLR
2.0000 g | INTRAVENOUS | Status: AC
  Administered 2015-10-09: 2 g via INTRAVENOUS
  Filled 2015-10-09: qty 50

## 2015-10-09 MED ORDER — METHOCARBAMOL 1000 MG/10ML IJ SOLN
500.0000 mg | Freq: Four times a day (QID) | INTRAVENOUS | Status: DC | PRN
  Filled 2015-10-09: qty 5

## 2015-10-09 MED ORDER — MEPERIDINE HCL 25 MG/ML IJ SOLN
6.2500 mg | INTRAMUSCULAR | Status: DC | PRN
  Filled 2015-10-09: qty 1

## 2015-10-09 MED ORDER — CEFADROXIL 500 MG PO CAPS: 500.0000 mg | ORAL_CAPSULE | Freq: Two times a day (BID) | ORAL | Status: DC

## 2015-10-09 MED ORDER — LIDOCAINE HCL (CARDIAC) 20 MG/ML IV SOLN
INTRAVENOUS | Status: AC
  Filled 2015-10-09: qty 5

## 2015-10-09 MED ORDER — ONDANSETRON HCL 4 MG/2ML IJ SOLN
4.0000 mg | Freq: Four times a day (QID) | INTRAMUSCULAR | Status: DC | PRN
  Filled 2015-10-09: qty 2

## 2015-10-09 MED ORDER — FENTANYL CITRATE (PF) 100 MCG/2ML IJ SOLN
50.0000 ug | INTRAMUSCULAR | Status: DC | PRN
  Filled 2015-10-09: qty 1

## 2015-10-09 MED ORDER — ARTIFICIAL TEARS OP OINT
TOPICAL_OINTMENT | OPHTHALMIC | Status: AC
  Filled 2015-10-09: qty 3.5

## 2015-10-09 MED ORDER — DEXAMETHASONE SODIUM PHOSPHATE 10 MG/ML IJ SOLN
INTRAMUSCULAR | Status: DC | PRN
  Administered 2015-10-09: 10 mg via INTRAVENOUS

## 2015-10-09 MED ORDER — LACTATED RINGERS IR SOLN
Status: DC | PRN
  Administered 2015-10-09 (×2): 3000 mL

## 2015-10-09 MED ORDER — HYDROMORPHONE HCL 1 MG/ML IJ SOLN
0.5000 mg | INTRAMUSCULAR | Status: DC | PRN
  Administered 2015-10-09 (×2): 0.5 mg via INTRAVENOUS
  Filled 2015-10-09: qty 1

## 2015-10-09 SURGICAL SUPPLY — 40 items
BANDAGE ACE 6X5 VEL STRL LF (GAUZE/BANDAGES/DRESSINGS) ×2 IMPLANT
BANDAGE ELASTIC 6 VELCRO ST LF (GAUZE/BANDAGES/DRESSINGS) ×2 IMPLANT
BLADE CUDA 4.2 (BLADE) IMPLANT
BLADE CUDA SHAVER 3.5 (BLADE) ×1 IMPLANT
CHLORAPREP W/TINT 26ML (MISCELLANEOUS) ×2 IMPLANT
DRAPE ARTHROSCOPY W/POUCH 114 (DRAPES) ×2 IMPLANT
DRAPE U-SHAPE 47X51 STRL (DRAPES) IMPLANT
DRSG EMULSION OIL 3X3 NADH (GAUZE/BANDAGES/DRESSINGS) ×2 IMPLANT
DRSG PAD ABDOMINAL 8X10 ST (GAUZE/BANDAGES/DRESSINGS) ×2 IMPLANT
ELECT MENISCUS 165MM 90D (ELECTRODE) IMPLANT
ELECT REM PT RETURN 9FT ADLT (ELECTROSURGICAL)
ELECTRODE REM PT RTRN 9FT ADLT (ELECTROSURGICAL) ×1 IMPLANT
GAUZE SPONGE 4X4 12PLY STRL (GAUZE/BANDAGES/DRESSINGS) ×2 IMPLANT
GLOVE BIO SURGEON STRL SZ8.5 (GLOVE) ×2 IMPLANT
GLOVE INDICATOR 8.5 STRL (GLOVE) ×2 IMPLANT
GOWN STRL REUS W/TWL 2XL LVL3 (GOWN DISPOSABLE) ×2 IMPLANT
GOWN STRL REUS W/TWL XL LVL3 (GOWN DISPOSABLE) ×2 IMPLANT
HOLDER KNEE FOAM BLUE (MISCELLANEOUS) ×1 IMPLANT
IV LACTATED RINGER IRRG 3000ML (IV SOLUTION) ×6
IV LR IRRIG 3000ML ARTHROMATIC (IV SOLUTION) IMPLANT
IV NS IRRIG 3000ML ARTHROMATIC (IV SOLUTION) ×3 IMPLANT
KIT ROOM TURNOVER WOR (KITS) ×2 IMPLANT
KNEE WRAP E Z 3 GEL PACK (MISCELLANEOUS) ×2 IMPLANT
MANIFOLD NEPTUNE II (INSTRUMENTS) ×1 IMPLANT
NDL SAFETY ECLIPSE 18X1.5 (NEEDLE) ×1 IMPLANT
NEEDLE HYPO 18GX1.5 SHARP (NEEDLE) ×2
PACK ARTHROSCOPY DSU (CUSTOM PROCEDURE TRAY) ×2 IMPLANT
PACK BASIN DAY SURGERY FS (CUSTOM PROCEDURE TRAY) ×2 IMPLANT
PAD FOR LEG HOLDER (MISCELLANEOUS) ×1 IMPLANT
PADDING CAST COTTON 6X4 STRL (CAST SUPPLIES) ×2 IMPLANT
PENCIL BUTTON HOLSTER BLD 10FT (ELECTRODE) IMPLANT
SET ARTHROSCOPY TUBING (MISCELLANEOUS) ×2
SET ARTHROSCOPY TUBING LN (MISCELLANEOUS) ×1 IMPLANT
SUT ETHILON 3 0 PS 1 (SUTURE) ×2 IMPLANT
SUT ETHILON 4 0 PS 2 18 (SUTURE) IMPLANT
SYR 30ML LL (SYRINGE) ×1 IMPLANT
TOWEL OR 17X24 6PK STRL BLUE (TOWEL DISPOSABLE) ×2 IMPLANT
TUBE CONNECTING 12X1/4 (SUCTIONS) ×2 IMPLANT
WAND 30 DEG SABER W/CORD (SURGICAL WAND) IMPLANT
WAND 90 DEG TURBOVAC W/CORD (SURGICAL WAND) IMPLANT

## 2015-10-09 NOTE — Anesthesia Preprocedure Evaluation (Addendum)
Anesthesia Evaluation  Patient identified by MRN, date of birth, ID band Patient awake    Reviewed: Allergy & Precautions, NPO status , Patient's Chart, lab work & pertinent test results  Airway Mallampati: II  TM Distance: >3 FB Neck ROM: Full    Dental no notable dental hx. (+) Dental Advisory Given, Teeth Intact   Pulmonary neg pulmonary ROS, former smoker,    Pulmonary exam normal breath sounds clear to auscultation       Cardiovascular negative cardio ROS Normal cardiovascular exam Rhythm:Regular Rate:Normal     Neuro/Psych PSYCHIATRIC DISORDERS Anxiety Bipolar Disorder Thalamic pain syndrome  Chronic Narcotic use  Neuromuscular disease (L sided weakness and pain) negative psych ROS   GI/Hepatic negative GI ROS, Neg liver ROS,   Endo/Other  diabetes, Type 2, Oral Hypoglycemic Agents  Renal/GU negative Renal ROS  negative genitourinary   Musculoskeletal negative musculoskeletal ROS (+)   Abdominal   Peds negative pediatric ROS (+)  Hematology negative hematology ROS (+)   Anesthesia Other Findings   Reproductive/Obstetrics negative OB ROS                         Anesthesia Physical Anesthesia Plan  ASA: III  Anesthesia Plan: General   Post-op Pain Management: GA combined w/ Regional for post-op pain   Induction: Intravenous  Airway Management Planned: LMA  Additional Equipment:   Intra-op Plan:   Post-operative Plan:   Informed Consent: I have reviewed the patients History and Physical, chart, labs and discussed the procedure including the risks, benefits and alternatives for the proposed anesthesia with the patient or authorized representative who has indicated his/her understanding and acceptance.   Dental advisory given  Plan Discussed with: CRNA  Anesthesia Plan Comments: (Adductor canal block plus LMA)        Anesthesia Quick Evaluation

## 2015-10-09 NOTE — Op Note (Signed)
Preoperative diagnosis- Left knee medial meniscal tear  Postoperative diagnosis Left- knee medial meniscal tear  Procedure- Left knee arthroscopy with medial meniscal debridement, medial tibial plateau chondroplasty, and patellofemoral chondroplasty  Surgeon- Rod Can, MD  Anesthesia-General  EBL- Minimal  Complications- None  Condition- PACU - hemodynamically stable.  Brief clinical note- The patient is a 45 y/o female who sustained a left knee injury in December 2016. She presented with medial joint line pain and mechanical symptoms. MRI confirmed medial meniscal tear and arthritis. We discussed the risks, benefits, and alternatives to arthroscopic partial medial meniscectomy, and she elected to proceed.  Procedure in detail -   After successful administration of General anesthetic, a tourniquet is placed high on the Left thigh and the Left lower extremity is prepped and draped in the usual sterile fashion. Time out is performed by the surgical team. Standard superolateral and inferolateral portal sites are marked and incisions made with an 11 blade. The outflow cannula is passed through the superolateral portal and camera through the inferolateral portal and inflow is initiated. Arthroscopic visualization proceeds.   The undersurface of the patella and trochlea are visualized and found to have grade II chrondromalacia. The medial and lateral gutters are visualized and there are no loose bodies. Flexion and valgus force is applied to the knee and the medial compartment is entered. A spinal needle is passed into the joint through the site marked for the inferomedial portal. A small incision is made and the dilator passed into the joint. The findings for the medial compartment are complex medial meniscal tear at the level of the posterior horn / root with displaced flap. The tear is debrided to a stable base with baskets and a shaver. It is probed and found to be  stable. The was diffue grade III changes to the medial femoral condyle and grade II changes to the medial tibial plateau. Unstable areas were debrided with a shaver.   The intercondylar notch is visualized and the ACL was probed and found to be normal. The lateral compartment is entered and the findings are intact meniscus and cartilage.   The joint is again inspected and there are no other tears, defects or loose bodies identified. The arthroscopic equipment is then removed. The portals were closed with 3-0 nylon with a simple technique. 20 ml of .25% Marcaine with epinephrine are injected. The incisions are cleaned and dried and a bulky sterile dressing is applied. The patient is then awakened and transported to recovery in stable condition.

## 2015-10-09 NOTE — Anesthesia Procedure Notes (Addendum)
Anesthesia Regional Block:  Adductor canal block  Pre-Anesthetic Checklist: ,, timeout performed, Correct Patient, Correct Site, Correct Laterality, Correct Procedure, Correct Position, site marked, Risks and benefits discussed,  Surgical consent,  Pre-op evaluation,  At surgeon's request and post-op pain management  Laterality: Left and Lower  Prep: Dura Prep       Needles:  Injection technique: Single-shot  Needle Type: Echogenic Needle     Needle Length: 10cm 10 cm Needle Gauge: 21 and 21 G    Additional Needles:  Procedures: ultrasound guided (picture in chart) Adductor canal block Narrative:   Performed by: Personally   Additional Notes: Patient tolerated the procedure well without complications   Procedure Name: LMA Insertion Date/Time: 10/09/2015 7:38 AM Performed by: Wanita Chamberlain Pre-anesthesia Checklist: Patient identified, Timeout performed, Emergency Drugs available, Suction available and Patient being monitored Patient Re-evaluated:Patient Re-evaluated prior to inductionOxygen Delivery Method: Circle system utilized Preoxygenation: Pre-oxygenation with 100% oxygen Intubation Type: IV induction Ventilation: Mask ventilation without difficulty LMA: LMA inserted LMA Size: 4.0 Number of attempts: 1 Placement Confirmation: positive ETCO2 and breath sounds checked- equal and bilateral Tube secured with: Tape Dental Injury: Teeth and Oropharynx as per pre-operative assessment

## 2015-10-09 NOTE — Transfer of Care (Signed)
Immediate Anesthesia Transfer of Care Note  Patient: Amanda Vasquez  Procedure(s) Performed: Procedure(s): LEFT KNEE ARTHROSCOPY WITH PARTIAL MEDIAL MENISCECTOMY; PATELLA-FEMORAL CHONDROPLASTY (Left)  Patient Location: PACU  Anesthesia Type:General  Level of Consciousness: awake, alert , oriented and patient cooperative  Airway & Oxygen Therapy: Patient Spontanous Breathing and Patient connected to nasal cannula oxygen  Post-op Assessment: Report given to RN and Post -op Vital signs reviewed and stable  Post vital signs: Reviewed and stable Sao2  97% BP 159/91 110--15  Last Vitals:  Filed Vitals:   10/09/15 0720 10/09/15 0725  BP:    Pulse: 95 96  Temp:    Resp: 17 15    Complications: No apparent anesthesia complications

## 2015-10-09 NOTE — Anesthesia Postprocedure Evaluation (Signed)
Anesthesia Post Note  Patient: SHAQUITTA DIMOS  Procedure(s) Performed: Procedure(s) (LRB): LEFT KNEE ARTHROSCOPY WITH PARTIAL MEDIAL MENISCECTOMY; PATELLA-FEMORAL CHONDROPLASTY (Left)  Patient location during evaluation: PACU Anesthesia Type: General and Regional Level of consciousness: awake and alert Pain management: pain level controlled Vital Signs Assessment: post-procedure vital signs reviewed and stable Respiratory status: spontaneous breathing, nonlabored ventilation, respiratory function stable and patient connected to nasal cannula oxygen Cardiovascular status: blood pressure returned to baseline and stable Postop Assessment: no signs of nausea or vomiting Anesthetic complications: no    Last Vitals:  Filed Vitals:   10/09/15 0930 10/09/15 0945  BP: 159/91 145/85  Pulse: 114 111  Temp: 37.4 C   Resp: 14 12    Last Pain:  Filed Vitals:   10/09/15 1039  PainSc: 8     LLE Motor Response: Purposeful movement;Responds to commands (10/09/15 1030) LLE Sensation: Full sensation (10/09/15 1030)          Montez Hageman

## 2015-10-09 NOTE — H&P (Signed)
PREOPERATIVE H&P  Chief Complaint: MEDIAL MENISCAL TEAR LEFT KNEE  HPI: Amanda Vasquez is a 45 y.o. female who presents for preoperative history and physical with a diagnosis of MEDIAL MENISCAL TEAR LEFT KNEE. Symptoms are rated as moderate to severe, and have been worsening.  This is significantly impairing activities of daily living.  She has elected for surgical management.   Past Medical History  Diagnosis Date  . 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 Migraine   . Demyelinating disorder ([place])     brain lesion that touches thalamic  . Diabetes mellitus, type II ([place])   . Knee pain   . Bipolar 1 disorder ([place])     ATYPICAL MANIC  . Dyslipidemia   . Allergic rhinitis   . Generalized anxiety disorder   . History of colitis   . History of adenomatous polyp of colon   . Acute meniscal tear of left knee   . General weakness     left hand and leg  . Chronic constipation   . Wears glasses    Past Surgical History  Procedure Laterality Date  . Breast reduction surgery Bilateral 1997  . Cholecystectomy  1998  . Transthoracic echocardiogram  03-02-2015    normal LVF,  ef 65-70%  . Knee arthroscopy Right 1993   Social History   Social History  . Marital Status: Divorced    Spouse Name: N/A  . Number of Children: 0  . Years of Education: 12+   Occupational History  . RN- long-term disability     Social History Main Topics  . Smoking status: Former Smoker -- 0.25 packs/day for 20 years    Types: Cigarettes    Start date: 06/08/1986    Quit date: 08/22/2010  . Smokeless tobacco: Never Used  . Alcohol Use: No  . Drug Use: No  . Sexual Activity: Yes    Birth Control/ Protection: IUD     Comment: Mirena IUD placement Dec 2015   Other Topics Concern  . None    Social History Narrative   Divorced. Lives at home with herself.   Caffeine use: 1 cup coffee 3 times per week   Family History  Problem Relation Age of Onset  . Adopted: Yes   Allergies  Allergen Reactions  . Latex Hives  . Prednisone Other (See Comments)    hallucination   Prior to Admission medications   Medication Sig Start Date End Date Taking? Authorizing Provider  ALPRAZolam Duanne Moron) 0.5 MG tablet Take 1 tablet (0.5 mg total) by mouth 4 (four) times daily as needed for anxiety. 08/12/15  Yes Marjie Skiff, MD  aspirin 81 MG tablet Take 81 mg by mouth daily.   Yes Historical Provider, MD  atorvastatin (LIPITOR) 10 MG tablet Take 1 tablet (10 mg total) by mouth daily. Patient taking differently: Take 10 mg by mouth every evening.  08/11/15  Yes Rowe Clack, MD  b complex vitamins tablet Take 1 tablet by mouth daily.     Yes Historical Provider, MD  baclofen (LIORESAL) 10 MG tablet Take 1 tablet (10 mg total) by mouth 3 (three) times daily. 08/11/15  Yes Rowe Clack, MD  buPROPion (WELLBUTRIN XL) 300 MG 24 hr tablet Take 1 tablet (300 mg total) by mouth daily. Take 300  mg by mouth daily. Patient taking differently: Take 300 mg by mouth every morning. Take 300 mg by mouth daily. 08/12/15  Yes Marjie Skiff, MD  Cetirizine HCl 10 MG CAPS Take 1 capsule (10 mg total) by mouth daily as needed. Take by mouth. Patient taking differently: Take 10 mg by mouth every evening. Take by mouth. 08/11/15  Yes Rowe Clack, MD  Cholecalciferol (VITAMIN D3) 2000 UNITS TABS Take 100-2,000 Units by mouth daily.    Yes Historical Provider, MD  DULoxetine (CYMBALTA) 30 MG capsule Take 1 capsule (30 mg total) by mouth daily. Patient taking differently: Take 30 mg by mouth every morning.  09/16/15  Yes Marjie Skiff, MD  glipiZIDE (GLUCOTROL) 5 MG tablet Take 1 tablet (5 mg total) by mouth daily. Patient taking differently: Take 5 mg by mouth every morning.  08/11/15  Yes  Rowe Clack, MD  GRALISE 600 MG TABS Take 1,800 mg by mouth every evening.  02/20/15  Yes Historical Provider, MD  Liraglutide 18 MG/3ML SOPN Inject 0.1-0.2 mLs (0.6-1.2 mg total) into the skin daily. Week 1 take 0.6 mg daily, week 2 and after take 1.2 mg daily. Patient taking differently: Inject 2 mg into the skin once a week. BYDUREON--  Weekly on Saturday's 09/15/15  Yes Hoyt Koch, MD  metFORMIN (GLUCOPHAGE) 1000 MG tablet Take 1 tablet (1,000 mg total) by mouth 2 (two) times daily with a meal. 08/11/15  Yes Rowe Clack, MD  morphine (MS CONTIN) 60 MG 12 hr tablet Take 60 mg by mouth every 12 (twelve) hours.   Yes Historical Provider, MD  multivitamin-iron-minerals-folic acid (CENTRUM) chewable tablet Chew 1 tablet by mouth daily.   Yes Historical Provider, MD  oxyCODONE (OXY IR/ROXICODONE) 5 MG immediate release tablet Take 1 tablet (5 mg total) by mouth every 4 (four) hours as needed for severe pain. Per pain clinic Dr Hardin Negus 08/11/15  Yes Rowe Clack, MD  polyethylene glycol (MIRALAX / GLYCOLAX) packet Take 17 g by mouth daily.    Yes Historical Provider, MD  POTASSIUM CHLORIDE PO Take 198 mg by mouth daily. 01/27/11  Yes Historical Provider, MD  traZODone (DESYREL) 100 MG tablet Take 1 tablet (100 mg total) by mouth at bedtime as needed for sleep. 09/16/15  Yes Marjie Skiff, MD  valACYclovir (VALTREX) 1000 MG tablet Take 1 tablet (1,000 mg total) by mouth daily as needed. Must estab w/new provider for future refills 09/08/15  Yes Rowe Clack, MD     Positive ROS: All other systems have been reviewed and were otherwise negative with the exception of those mentioned in the HPI and as above.  Physical Exam: General: Alert, no acute distress Cardiovascular: No pedal edema Respiratory: No cyanosis, no use of accessory musculature GI: No organomegaly, abdomen is soft and non-tender Skin: No lesions in the area of chief complaint Neurologic: Sensation  intact distally Psychiatric: Patient is competent for consent with normal mood and affect Lymphatic: No axillary or cervical lymphadenopathy  MUSCULOSKELETAL: L knee skin intact. ++ effusion. No warmth or erythema. (+) [place].   Assessment: MEDIAL MENISCAL TEAR LEFT KNEE  Plan: Plan for Procedure(s): LEFT KNEE ARTHROSCOPY WITH PARTIAL MEDIAL MENISCECTOMY   The risks benefits and alternatives were discussed with the patient including but not limited to the risks of nonoperative treatment, versus surgical intervention including worsening of pain disorder, stiffness, worsening of arthritis, infection, bleeding, nerve injury, blood clots, cardiopulmonary complications, morbidity, mortality, among others, and they were  willing to proceed.   Noretta Frier, Horald Pollen, MD Cell [phone]   10/09/2015 7:27 AM

## 2015-10-09 NOTE — Discharge Instructions (Signed)
° °Dr. Brian Swinteck °Hip & Knee Reconstruction °[facility] °[address] °[place], [place] [address] °[phone] ° ° °Arthroscopic Procedure, Knee °An arthroscopic procedure can find what is wrong with your knee. °PROCEDURE °Arthroscopy is a surgical technique that allows your orthopedic surgeon to diagnose and treat your knee injury with accuracy. They will look into your knee through a small instrument. This is almost like a small (pencil sized) telescope. Because arthroscopy affects your knee less than open knee surgery, you can anticipate a more rapid recovery. Taking an active role by following your caregiver's instructions will help with rapid and complete recovery. Use crutches, rest, elevation, ice, and knee exercises as instructed. The length of recovery depends on various factors including type of injury, age, physical condition, medical conditions, and your rehabilitation. °Your knee is the joint between the large bones (femur and tibia) in your leg. Cartilage covers these bone ends which are smooth and slippery and allow your knee to bend and move smoothly. Two menisci, thick, semi-lunar shaped pads of cartilage which form a rim inside the joint, help absorb shock and stabilize your knee. Ligaments bind the bones together and support your knee joint. Muscles move the joint, help support your knee, and take stress off the joint itself. Because of this all programs and physical therapy to rehabilitate an injured or repaired knee require rebuilding and strengthening your muscles. °AFTER THE PROCEDURE °· After the procedure, you will be moved to a recovery area until most of the effects of the medication have worn off. Your caregiver will discuss the test results with you.  °· Only take over-the-counter or prescription medicines for pain, discomfort, or fever as directed by your caregiver.  °SEEK MEDICAL CARE IF:  °· You have increased bleeding from your wounds.  °· You see  redness, swelling, or have increasing pain in your wounds.  °· You have pus coming from your wound.  °· You have an oral temperature above 102° F (38.9° C).  °· You notice a bad smell coming from the wound or dressing.  °· You have severe pain with any motion of your knee.  °SEEK IMMEDIATE MEDICAL CARE IF:  °· You develop a rash.  °· You have difficulty breathing.  °· You have any allergic problems.  °FURTHER INSTRUCTIONS:  °· ICE to the affected knee every three hours for 30 minutes at a time and then as needed for pain and swelling.  Continue to use ice on the knee for pain and swelling from surgery. You may notice swelling that will progress down to the foot and ankle.  This is normal after surgery.  Elevate the leg when you are not up walking on it.   ° °DIET °You may resume your previous home diet once your are discharged from the hospital. ° °DRESSING / WOUND CARE / SHOWERING °You may change your dressing 3-5 days after surgery.  Then change the dressing every day with sterile gauze.  Please use good hand washing techniques before changing the dressing.  Do not use any lotions or creams on the incision until instructed by your surgeon. °You may start showering two days after being discharged home but do not submerge the incisions under water.  °Change dressing 48 hours after the procedure and then cover the small incisions with band aids until your follow up visit. °Change the surgical dressings daily and reapply a dry dressing each time.  ° °ACTIVITY °Walk with your walker as instructed. °Use walker as long   as suggested by your caregivers. °Avoid periods of inactivity such as sitting longer than an hour when not asleep. This helps prevent blood clots.  °You may resume a sexual relationship in one month or when given the OK by your doctor.  °You may return to work once you are cleared by your doctor.  °Do not drive a car for 6 weeks or until released by you surgeon.  °Do not drive while taking  narcotics. ° °WEIGHT BEARING °Weight bearing as tolerated with assist device (walker, cane, etc) as directed, use it as long as suggested by your surgeon or therapist, typically at least 4-6 weeks. ° °POSTOPERATIVE CONSTIPATION PROTOCOL °Constipation - defined medically as fewer than three stools per week and severe constipation as less than one stool per week. ° °One of the most common issues patients have following surgery is constipation.  Even if you have a regular bowel pattern at home, your normal regimen is likely to be disrupted due to multiple reasons following surgery.  Combination of anesthesia, postoperative narcotics, change in appetite and fluid intake all can affect your bowels.  In order to avoid complications following surgery, here are some recommendations in order to help you during your recovery period. ° °Colace (docusate) - Pick up an over-the-counter form of Colace or another stool softener and take twice a day as long as you are requiring postoperative pain medications.  Take with a full glass of water daily.  If you experience loose stools or diarrhea, hold the colace until you stool forms back up.  If your symptoms do not get better within 1 week or if they get worse, check with your doctor. ° °Dulcolax (bisacodyl) - Pick up over-the-counter and take as directed by the product packaging as needed to assist with the movement of your bowels.  Take with a full glass of water.  Use this product as needed if not relieved by Colace only.  ° °MiraLax (polyethylene glycol) - Pick up over-the-counter to have on hand.  MiraLax is a solution that will increase the amount of water in your bowels to assist with bowel movements.  Take as directed and can mix with a glass of water, juice, soda, coffee, or tea.  Take if you go more than two days without a movement. °Do not use MiraLax more than once per day. Call your doctor if you are still constipated or irregular after using this medication for 7 days  in a row. ° °If you continue to have problems with postoperative constipation, please contact the office for further assistance and recommendations.  If you experience "the worst abdominal pain ever" or develop nausea or vomiting, please contact the office immediatly for further recommendations for treatment. ° °ITCHING ° If you experience itching with your medications, try taking only a single pain pill, or even half a pain pill at a time.  You can also use Benadryl over the counter for itching or also to help with sleep.  ° °TED HOSE STOCKINGS °Wear the elastic stockings on both legs for three weeks following surgery during the day but you may remove then at night for sleeping. ° °MEDICATIONS °See your medication summary on the “After Visit Summary” that the nursing staff will review with you prior to discharge.  You may have some home medications which will be placed on hold until you complete the course of blood thinner medication.  It is important for you to complete the blood thinner medication as prescribed by your surgeon.  Continue   your approved medications as instructed at time of discharge. °Do not drive while taking narcotics.  ° °PRECAUTIONS °If you experience chest pain or shortness of breath - call 911 immediately for transfer to the hospital emergency department.  °If you develop a fever greater that 101 F, purulent drainage from wound, increased redness or drainage from wound, foul odor from the wound/dressing, or calf pain - CONTACT YOUR SURGEON.   °                                                °FOLLOW-UP APPOINTMENTS °Make sure you keep all of your appointments after your operation with your surgeon and caregivers. You should call the office at [phone]  and make an appointment for approximately one week after the date of your surgery or on the date instructed by your surgeon outlined in the "After Visit Summary". ° °RANGE OF MOTION AND STRENGTHENING EXERCISES  °Rehabilitation of the knee  is important following a knee injury or an operation. After just a few days of immobilization, the muscles of the thigh which control the knee become weakened and shrink (atrophy). Knee exercises are designed to build up the tone and strength of the thigh muscles and to improve knee motion. Often times heat used for twenty to thirty minutes before working out will loosen up your tissues and help with improving the range of motion but do not use heat for the first two weeks following surgery. These exercises can be done on a training (exercise) mat, on the floor, on a table or on a bed. Use what ever works the best and is most comfortable for you Knee exercises include: ° °QUAD STRENGTHENING EXERCISES °Strengthening Quadriceps Sets ° °Tighten muscles on top of thigh by pushing knees down into floor or table. °Hold for 20 seconds. Repeat 10 times. °Do 2 sessions per day. ° ° ° ° °Strengthening Terminal Knee Extension ° °With knee bent over bolster, straighten knee by tightening muscle on top of thigh. Be sure to keep bottom of knee on bolster. °Hold for 20 seconds. Repeat 10 times. °Do 2 sessions per day. ° ° °Straight Leg with Bent Knee ° °Lie on back with opposite leg bent. Keep involved knee slightly bent at knee and raise leg 4-6". Hold for 10 seconds. °Repeat 20 times per set. °Do 2 sets per session. °Do 2 sessions per day. ° ° °Post Anesthesia Home Care Instructions ° °Activity: °Get plenty of rest for the remainder of the day. A responsible adult should stay with you for 24 hours following the procedure.  °For the next 24 hours, DO NOT: °-Drive a car °-Operate machinery °-Drink alcoholic beverages °-Take any medication unless instructed by your physician °-Make any legal decisions or sign important papers. ° °Meals: °Start with liquid foods such as gelatin or soup. Progress to regular foods as tolerated. Avoid greasy, spicy, heavy foods. If nausea and/or vomiting occur, drink only clear liquids until the nausea  and/or vomiting subsides. Call your physician if vomiting continues. ° °Special Instructions/Symptoms: °Your throat may feel dry or sore from the anesthesia or the breathing tube placed in your throat during surgery. If this causes discomfort, gargle with warm salt water. The discomfort should disappear within 24 hours. ° °If you had a scopolamine patch placed behind your ear for the management of post- operative nausea and/or vomiting: ° °  1. The medication in the patch is effective for 72 hours, after which it should be removed.  Wrap patch in a tissue and discard in the trash. Wash hands thoroughly with soap and water. °2. You may remove the patch earlier than 72 hours if you experience unpleasant side effects which may include dry mouth, dizziness or visual disturbances. °3. Avoid touching the patch. Wash your hands with soap and water after contact with the patch. °  ° °

## 2015-10-12 ENCOUNTER — Encounter (HOSPITAL_BASED_OUTPATIENT_CLINIC_OR_DEPARTMENT_OTHER): Payer: Self-pay | Admitting: Orthopedic Surgery

## 2015-10-13 ENCOUNTER — Encounter: Payer: Self-pay | Admitting: Physical Therapy

## 2015-10-13 ENCOUNTER — Ambulatory Visit: Payer: BLUE CROSS/BLUE SHIELD | Attending: Orthopedic Surgery | Admitting: Physical Therapy

## 2015-10-13 DIAGNOSIS — M25662 Stiffness of left knee, not elsewhere classified: Secondary | ICD-10-CM | POA: Insufficient documentation

## 2015-10-13 DIAGNOSIS — R531 Weakness: Secondary | ICD-10-CM | POA: Diagnosis present

## 2015-10-13 DIAGNOSIS — R262 Difficulty in walking, not elsewhere classified: Secondary | ICD-10-CM | POA: Diagnosis present

## 2015-10-13 DIAGNOSIS — M25562 Pain in left knee: Secondary | ICD-10-CM | POA: Diagnosis present

## 2015-10-13 NOTE — Therapy (Signed)
[place] MAIN [facility] SERVICES [address] [place], [place], [address] Phone: [phone]   Fax:  [phone]  Physical Therapy Evaluation  Patient Details  Name: Amanda Vasquez MRN: [id number] Date of Birth: Nov 08, 1970 Referring Provider: Rod Can MD  Encounter Date: 10/13/2015      PT End of Session - 10/13/15 1623    Visit Number 1   Number of Visits 13   Date for PT Re-Evaluation 11/24/15   PT Start Time [id number]   PT Stop Time 1610   PT Time Calculation (min) 55 min   Activity Tolerance Patient tolerated treatment well   Behavior During Therapy [facility] for tasks assessed/performed      Past Medical History  Diagnosis Date  . 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 Migraine   . Demyelinating disorder ([place])     brain lesion that touches thalamic  . Knee pain   . Dyslipidemia   . Allergic rhinitis   . Generalized anxiety disorder     mostly controlled  . History of colitis   . History of adenomatous polyp of colon   . Acute meniscal tear of left knee   . General weakness     left hand and leg  . Chronic constipation   . Wears glasses   . Diabetes mellitus, type II ([place])     controlled with medication;    Past Surgical History  Procedure Laterality Date  . Breast reduction surgery Bilateral 1997  . Cholecystectomy  1998  . Transthoracic echocardiogram  03-02-2015    normal LVF,  ef 65-70%  . Knee arthroscopy Right 1993  . Knee arthroscopy with medial menisectomy Left 10/09/2015    Procedure: LEFT KNEE ARTHROSCOPY WITH PARTIAL MEDIAL MENISCECTOMY; PATELLA-FEMORAL CHONDROPLASTY;  Surgeon: Rod Can, MD;  Location: [place];  Service: Orthopedics;  Laterality: Left;    There were no vitals  filed for this visit.  Visit Diagnosis:  Left knee pain - Plan: PT plan of care cert/re-cert  Difficulty walking - Plan: PT plan of care cert/re-cert  Knee stiffness, left - Plan: PT plan of care cert/re-cert  Weakness - Plan: PT plan of care cert/re-cert      Subjective Assessment - 10/13/15 1524    Subjective 45 yo Female s/p left knee scope on 10/09/15; She reports increased pain after knee surgery; She had a nerve block with general anesthesia. She presents to therapy with RW; In addition to left knee pain she has central pain lesion on right thalamus with left side pain and sensitivity; She reports trying to be as active as possible but also trying to not to overdo it. She reports using RW a few weeks leading up to surgery due to knee discomfort from medial meniscus injury;    Pertinent History history of thalamic pain; seeing chronic pain specialist; anxiety/depression (somewhat controlled); lives alone   How long can you sit comfortably? 20-30 min due to thalamic pain   How long can you stand comfortably? 10 min max due to knee pain   How long can you walk comfortably? uses RW, about 500 feet   Diagnostic tests MRI prior to surgery showed medial meniscus injury   Patient Stated Goals "healing correctly, improve strength; walk without AD, improve balance;  Currently in Pain? Yes   Pain Score 8    Pain Location Knee   Pain Orientation Left   Pain Descriptors / Indicators Aching   Pain Type Acute pain;Surgical pain   Pain Onset In the past 7 days   Pain Frequency Constant   Aggravating Factors  overdoing activity;, prolonged standing;   Pain Relieving Factors rest and elevation; ice packs, medication;    Effect of Pain on Daily Activities decreased            [facility] PT Assessment - 10/13/15 0001    Assessment   Medical Diagnosis left knee pain s/p knee scope   Referring Provider Rod Can MD   Onset Date/Surgical Date 10/09/15   Hand Dominance Right   Next MD  Visit 10/21/15   Prior Therapy denies any PT for this condition; has had PT in the past for other problems   Precautions   Precautions Fall   Required Braces or Orthoses --  has knee brace but hasn't been wearing since surgery   Restrictions   Weight Bearing Restrictions No   Balance Screen   Has the patient fallen in the past 6 months --  has multiple near misses   Has the patient had a decrease in activity level because of a fear of falling?  Yes   Is the patient reluctant to leave their home because of a fear of falling?  Yes   Home Environment   Additional Comments lives in 2 story home; has living all on main floor; has 1 step to get to back door; has 5 steps in front, has wall but no railing; negotiates steps one step at a time;    Prior Function   Level of Independence Independent with basic ADLs;Independent with gait;Independent with transfers   Vocation On disability  was working as a Therapist, sports, disabled since 2012   Vocation Requirements NA   Leisure read, visit with friends, crafts/sew;    Cognition   Overall Cognitive Status Within Functional Limits for tasks assessed   Observation/Other Assessments   Skin Integrity does have stiches in left knee otherwise unremarkable;   Observation/Other Assessments-Edema    Edema --  has swelling in LLE knee; not assessed    Sensation   Light Touch Appears Intact   Coordination   Gross Motor Movements are Fluid and Coordinated Yes   Fine Motor Movements are Fluid and Coordinated Yes   Posture/Postural Control   Posture Comments sits with mild slumped posture; able to self correct with verbal cues;   AROM   Right Knee Extension 0   Right Knee Flexion 130   Left Knee Extension -12  in supine   Left Knee Flexion 100  in supine   Strength   Overall Strength Comments RLE gross strength is 4/5; LLE: hip flexion: 3+/5, hip abduction 3/5, hip adduction 3/5, knee flexion: 3-/5, knee extension3-/5, ankle: DF: 3/5, PF: 3+/5   Palpation    Patella mobility demontrates good patella mobility in LLE; however increased pain with lateral and superior glides;   Palpation comment reports moderate to severe tenderness along left medial and inferior knee;   Transfers   Comments requires push up from chair with BUE for transfers;   Ambulation/Gait   Gait Comments ambulates with RW, supervision, decreased step length on RLE with decreased stance on LLE; increased LLE knee flexion with gait; slower gait speed; step to gait pattern;   Standardized Balance Assessment   10 Meter Walk 0.8 m/s with RW,  home ambulator, limited community ambulator   High Level Balance   High Level Balance Comments able to maintain SLS on RLE for 5 sec; unable to tolerate SLS on LLE with decreased knee extension; able to stand feet together eyes closed for 10 sec with slight sway, supervision;       TREATMENT: PT initiated HEP: LLE quad sets 3 sec hold x10 with mod VCS to isolate quad muscle activation for increased patella movement and to increase hold time for increased quad strengthening; LLE heel slides x5 reps with min VCs to increase ROM for better flexibility;  PT instructed patient to perform exercise multiple times a day for increased joint mobility;                    PT Education - 10/13/15 1623    Education provided Yes   Education Details HEP, LE ROM, quad sets   Person(s) Educated Patient   Methods Explanation;Verbal cues   Comprehension Verbalized understanding;Returned demonstration;Verbal cues required             PT Long Term Goals - 10/13/15 1630    PT LONG TERM GOAL #1   Title Patient will be independent in home exercise program to improve strength/mobility for better functional independence with ADLs. by 11/24/15   Time 6   Period Weeks   Status New   PT LONG TERM GOAL #2   Title Patient will increase 10 meter walk test to >1.65m/s as to improve gait speed for better community ambulation and to reduce fall risk.  by 11/24/15   Time 6   Period Weeks   Status New   PT LONG TERM GOAL #3   Title Patient will report being able to stand for at least 20 min without significant increase in left knee pain for return to PLOF and independence in ADLs by 11/24/15   Time 6   Period Weeks   Status New   PT LONG TERM GOAL #4   Title Patient will increase LLE gross strength to 4+/5 as to improve functional strength for independent gait, increased standing tolerance and increased ADL ability. by 11/24/15   Time 6   Period Weeks   Status New   PT LONG TERM GOAL #5   Title Patient will increase lower extremity functional scale to >40/80 to demonstrate improved functional mobility and increased tolerance with ADLs. by 11/24/15   Time 6   Period Weeks   Status New   Additional Long Term Goals   Additional Long Term Goals Yes   PT LONG TERM GOAL #6   Title Patient will increase LLE knee AROM to 0-115 degrees for increased functional ROM for stair negotiation/transfers by 11/24/15   Time 6   Period Weeks   Status New   PT LONG TERM GOAL #7   Title Patient will tolerate 5 seconds of single leg stance without loss of balance to improve ability to get in and out of shower safely. by 11/24/15   Time 6   Period Weeks   Status New               Plan - 10/13/15 1624    Clinical Impression Statement 45 yo Female is s/p left knee scope on 10/09/15; Patient had Left knee arthroscopy with medial meniscal debridement, medial tibial plateau chondroplasty, and patellofemoral chondroplasty. Patient presents to therapy with RW. She exhibits decreased left knee ROM especially with decreased knee extension in stance. PT also identified increased swelling in left  knee. She does demonstrate good patella mobility but has increased sensitivity to palpation along medial and inferior knee; Patient has a thalamic lesion with left side chronic pain and sensitivity; She is currently seeing a pain specialist to try and manage her pain. She  reports on a typical day her pain will be 7-8/10; Patient would benefit from skilled PT intervention to improve knee ROM, increase LLE strength and improve balance/gait safety;    Pt will benefit from skilled therapeutic intervention in order to improve on the following deficits Abnormal gait;Decreased endurance;Hypomobility;Increased edema;Decreased activity tolerance;Decreased strength;Pain;Difficulty walking;Decreased mobility;Decreased balance;Decreased range of motion;Impaired flexibility;Decreased safety awareness   Rehab Potential Fair   Clinical Impairments Affecting Rehab Potential positive: motivated, was able to walk independently prior to surgery, young in age; negative: co-morbidities, chronic pain; Patient's clinical presentation is evolving as she has a thalamic lesion with varying degrees of left sided pain and increased sensitivity to touch on left side of body;    PT Frequency 2x / week   PT Duration 6 weeks   PT Treatment/Interventions Aquatic Therapy;Cryotherapy;Electrical Stimulation;Moist Heat;Balance training;Therapeutic exercise;Therapeutic activities;Functional mobility training;Stair training;Gait training;DME Instruction;Ultrasound;Neuromuscular re-education;Patient/family education;Manual techniques;Taping;Energy conservation;Dry needling;Passive range of motion   PT Next Visit Plan advance knee strengthening; focus on quad strengthening/knee extension   PT Home Exercise Plan initiated- see patient instructions   Consulted and Agree with Plan of Care Patient         Problem List Patient Active Problem List   Diagnosis Date Noted  . Pre-operative cardiovascular examination 09/21/2015  . Left knee pain 08/03/2015  . 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 CTS (carpal tunnel syndrome) 02/25/2015  . Paresthesias 02/02/2015  . Demyelinating nervous system disease or syndrome ([place]) 01/20/2015  . Demyelinating disease of central nervous system ([place]) 03/01/2012  . Thalamic  pain syndrome (hyperesthetic) 09/07/2010  . Hyperlipidemia 05/28/2010  . OBESITY 02/26/2010  . Herpes simplex virus (HSV) infection 12/21/2009  . ALLERGIC RHINITIS 12/21/2009  . BACK PAIN, THORACIC REGION 11/11/2009  . Diabetes mellitus type 2 in obese ([place]) 07/28/2008  . Anxiety state 07/03/2008  . AMENORRHEA 12/17/2007  . DISORDER, BIPOLAR, ATYPICAL MANIC 05/14/2007    Amanda Vasquez PT, DPT 10/13/2015, 4:34 PM  [place] MAIN [facility] SERVICES [address] [place], [place], [address] Phone: [phone]   Fax:  [phone]  Name: Amanda Vasquez MRN: [id number] Date of Birth: 1971-01-14

## 2015-10-13 NOTE — Patient Instructions (Signed)
Quad Sets    Slowly tighten thigh muscles of straight, left leg while counting out loud to __3__. Relax. Repeat _10___ times. Do _3-5___ sessions per day.  http://gt2.exer.us/293   Copyright  VHI. All rights reserved.  Heel Slides    Squeeze pelvic floor and hold. Slide left heel along bed towards bottom. Hold for _2__ seconds. Slide back to flat knee position. Repeat _10__ times. Do _3-5__ times a day. Repeat with other leg.    Copyright  VHI. All rights reserved.

## 2015-10-15 ENCOUNTER — Ambulatory Visit: Payer: BLUE CROSS/BLUE SHIELD | Admitting: Physical Therapy

## 2015-10-15 DIAGNOSIS — R262 Difficulty in walking, not elsewhere classified: Secondary | ICD-10-CM

## 2015-10-15 DIAGNOSIS — M25562 Pain in left knee: Secondary | ICD-10-CM

## 2015-10-15 DIAGNOSIS — M25662 Stiffness of left knee, not elsewhere classified: Secondary | ICD-10-CM

## 2015-10-15 NOTE — Therapy (Signed)
[place] [facility] [address], [place], [address] Phone: [phone]   Fax:  [phone]  Physical Therapy Treatment  Patient Details  Name: Amanda Vasquez MRN: [id number] Date of Birth: 13-Jun-1971 Referring Provider: Rod Can MD  Encounter Date: 10/15/2015      PT End of Session - 10/15/15 1404    Visit Number 2   Number of Visits 13   Date for PT Re-Evaluation 11/24/15   PT Start Time 1300   PT Stop Time 1341   PT Time Calculation (min) 41 min   Activity Tolerance Patient tolerated treatment well   Behavior During Therapy [facility] for tasks assessed/performed      Past Medical History  Diagnosis Date  . 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 Migraine   . Demyelinating disorder ([place])     brain lesion that touches thalamic  . Knee pain   . Dyslipidemia   . Allergic rhinitis   . Generalized anxiety disorder     mostly controlled  . History of colitis   . History of adenomatous polyp of colon   . Acute meniscal tear of left knee   . General weakness     left hand and leg  . Chronic constipation   . Wears glasses   . Diabetes mellitus, type II ([place])     controlled with medication;    Past Surgical History  Procedure Laterality Date  . Breast reduction surgery Bilateral 1997  . Cholecystectomy  1998  . Transthoracic echocardiogram  03-02-2015    normal LVF,  ef 65-70%  . Knee arthroscopy Right 1993  . Knee arthroscopy with medial menisectomy Left 10/09/2015    Procedure: LEFT KNEE ARTHROSCOPY WITH PARTIAL MEDIAL MENISCECTOMY; PATELLA-FEMORAL CHONDROPLASTY;  Surgeon: Rod Can, MD;  Location: [place];  Service: Orthopedics;  Laterality: Left;    There were no  vitals filed for this visit.  Visit Diagnosis:  Left knee pain  Difficulty walking  Knee stiffness, left      Subjective Assessment - 10/15/15 1328    Subjective Patient reports she has been attempting ambulaiton without device at home, though she continues to have knee pain with such. She reports no new falls, use of RW prior to the surgery (multiple episodes of turning on flexed knee).    Pertinent History history of thalamic pain; seeing chronic pain specialist; anxiety/depression (somewhat controlled); lives alone   How long can you sit comfortably? 20-30 min due to thalamic pain   How long can you stand comfortably? 10 min max due to knee pain   How long can you walk comfortably? uses RW, about 500 feet   Diagnostic tests MRI prior to surgery showed medial meniscus injury   Patient Stated Goals "healing correctly, improve strength; walk without AD, improve balance;    Currently in Pain? Yes       Gait training with and without RW emphasizing heel strike to promote knee extension as patient had been landing at midfoot/plantarflexed. Patient reported significant decrease in pain, less pressure in her quadricep area after cuing provided. X 60' for 5 rounds 1 bout of ascending 2 steps, step to pattern with no loss of balance noted.   Quad sets with manual overpressure  2 sets x  10 repetitions with 5" holds   LAQs with BW x10, 0.75# ankle weight x 10, 1# ankle weight x10, 1.5# ankle weight x 10 repetitions -- no pain increase reported.   TKE instruction x 8 repetitions with emphasis on end range knee extension.   Manual stretching of PFs into DF x 10 repetitions for 5" holds for 2 sets, reported less pain and tightness in calf afterwards in gait.                           PT Education - 10/15/15 1403    Education provided Yes   Education Details Emphasized heel strike in gait, progression of strengthening and stretching program to normalize gait pattern.     Person(s) Educated Patient   Methods Explanation;Demonstration   Comprehension Returned demonstration;Verbalized understanding             PT Long Term Goals - 10/13/15 1630    PT LONG TERM GOAL #1   Title Patient will be independent in home exercise program to improve strength/mobility for better functional independence with ADLs. by 11/24/15   Time 6   Period Weeks   Status New   PT LONG TERM GOAL #2   Title Patient will increase 10 meter walk test to >1.87m/s as to improve gait speed for better community ambulation and to reduce fall risk. by 11/24/15   Time 6   Period Weeks   Status New   PT LONG TERM GOAL #3   Title Patient will report being able to stand for at least 20 min without significant increase in left knee pain for return to PLOF and independence in ADLs by 11/24/15   Time 6   Period Weeks   Status New   PT LONG TERM GOAL #4   Title Patient will increase LLE gross strength to 4+/5 as to improve functional strength for independent gait, increased standing tolerance and increased ADL ability. by 11/24/15   Time 6   Period Weeks   Status New   PT LONG TERM GOAL #5   Title Patient will increase lower extremity functional scale to >40/80 to demonstrate improved functional mobility and increased tolerance with ADLs. by 11/24/15   Time 6   Period Weeks   Status New   Additional Long Term Goals   Additional Long Term Goals Yes   PT LONG TERM GOAL #6   Title Patient will increase LLE knee AROM to 0-115 degrees for increased functional ROM for stair negotiation/transfers by 11/24/15   Time 6   Period Weeks   Status New   PT LONG TERM GOAL #7   Title Patient will tolerate 5 seconds of single leg stance without loss of balance to improve ability to get in and out of shower safely. by 11/24/15   Time 6   Period Weeks   Status New               Plan - 10/15/15 1404    Clinical Impression Statement Patient reports significant improvement in pain symptoms and normalization  of gait pattern with emphasis on knee extension and heel strike to reduce PFJ compression. Patient demonstrates tightness in PFs and weakness in quadriceps secondary to altered gait pattern, though she reports decrease in pain after tx of the impairment listed.    Pt will benefit from skilled therapeutic intervention in order to improve on the following deficits Abnormal gait;Decreased endurance;Hypomobility;Increased edema;Decreased activity tolerance;Decreased strength;Pain;Difficulty walking;Decreased mobility;Decreased balance;Decreased range of  motion;Impaired flexibility;Decreased safety awareness   Rehab Potential Fair   Clinical Impairments Affecting Rehab Potential positive: motivated, was able to walk independently prior to surgery, young in age; negative: co-morbidities, chronic pain; Patient's clinical presentation is evolving as she has a thalamic lesion with varying degrees of left sided pain and increased sensitivity to touch on left side of body;    PT Frequency 2x / Vasquez   PT Duration 6 weeks   PT Treatment/Interventions Aquatic Therapy;Cryotherapy;Electrical Stimulation;Moist Heat;Balance training;Therapeutic exercise;Therapeutic activities;Functional mobility training;Stair training;Gait training;DME Instruction;Ultrasound;Neuromuscular re-education;Patient/family education;Manual techniques;Taping;Energy conservation;Dry needling;Passive range of motion   PT Next Visit Plan advance knee strengthening; focus on quad strengthening/knee extension   PT Home Exercise Plan initiated- see patient instructions   Consulted and Agree with Plan of Care Patient        Problem List Patient Active Problem List   Diagnosis Date Noted  . Pre-operative cardiovascular examination 09/21/2015  . Left knee pain 08/03/2015  . 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 CTS (carpal tunnel syndrome) 02/25/2015  . Paresthesias 02/02/2015  . Demyelinating nervous system disease or syndrome ([place]) 01/20/2015   . Demyelinating disease of central nervous system ([place]) 03/01/2012  . Thalamic pain syndrome (hyperesthetic) 09/07/2010  . Hyperlipidemia 05/28/2010  . OBESITY 02/26/2010  . Herpes simplex virus (HSV) infection 12/21/2009  . ALLERGIC RHINITIS 12/21/2009  . BACK PAIN, THORACIC REGION 11/11/2009  . Diabetes mellitus type 2 in obese ([place]) 07/28/2008  . Anxiety state 07/03/2008  . AMENORRHEA 12/17/2007  . DISORDER, BIPOLAR, ATYPICAL MANIC 05/14/2007   Kerman Passey, PT, DPT    10/15/2015, 2:07 PM  [place] [facility] [address], [place], [address] Phone: [phone]   Fax:  [phone]  Name: Amanda Vasquez MRN: [id number] Date of Birth: 06/21/1971

## 2015-10-20 ENCOUNTER — Ambulatory Visit: Payer: BLUE CROSS/BLUE SHIELD

## 2015-10-20 DIAGNOSIS — M25562 Pain in left knee: Secondary | ICD-10-CM

## 2015-10-20 DIAGNOSIS — R262 Difficulty in walking, not elsewhere classified: Secondary | ICD-10-CM

## 2015-10-20 DIAGNOSIS — R531 Weakness: Secondary | ICD-10-CM

## 2015-10-20 DIAGNOSIS — M25662 Stiffness of left knee, not elsewhere classified: Secondary | ICD-10-CM

## 2015-10-20 NOTE — Therapy (Signed)
[facility] [address] [place], [address] Phone: [phone]   Fax:  [phone]  Physical Therapy Treatment  Patient Details  Name: Amanda Vasquez MRN: [id number] Date of Birth: 1971-06-28 Referring Provider: Rod Can MD  Encounter Date: 10/20/2015      PT End of Session - 10/21/15 0911    Visit Number 3   Number of Visits 13   Date for PT Re-Evaluation 11/24/15   PT Start Time 1300   PT Stop Time 1345   PT Time Calculation (min) 45 min   Equipment Utilized During Treatment Gait belt   Activity Tolerance Patient tolerated treatment well   Behavior During Therapy [facility] for tasks assessed/performed      Past Medical History  Diagnosis Date  . 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 Migraine   . Demyelinating disorder ([place])     brain lesion that touches thalamic  . Knee pain   . Dyslipidemia   . Allergic rhinitis   . Generalized anxiety disorder     mostly controlled  . History of colitis   . History of adenomatous polyp of colon   . Acute meniscal tear of left knee   . General weakness     left hand and leg  . Chronic constipation   . Wears glasses   . Diabetes mellitus, type II ([place])     controlled with medication;    Past Surgical History  Procedure Laterality Date  . Breast reduction surgery Bilateral 1997  . Cholecystectomy  1998  . Transthoracic echocardiogram  03-02-2015    normal LVF,  ef 65-70%  . Knee arthroscopy Right 1993  . Knee arthroscopy with medial menisectomy Left 10/09/2015    Procedure: LEFT KNEE ARTHROSCOPY WITH PARTIAL MEDIAL MENISCECTOMY; PATELLA-FEMORAL CHONDROPLASTY;  Surgeon: Rod Can, MD;  Location: [facility];  Service:  Orthopedics;  Laterality: Left;    There were no vitals filed for this visit.  Visit Diagnosis:  Left knee pain  Difficulty walking  Knee stiffness, left  Weakness      Subjective Assessment - 10/20/15 1306    Subjective Pt reports she has not been using the walker since Saturday. Pt denies increase in L knee pain since discontinuing the walker. She complaining of some suprapatellar swelling on this date. Denies redness, chills, fever, or drainage from incision. Follow-up appointment with orthopedics on Wednesday.    Pertinent History history of thalamic pain; seeing chronic pain specialist; anxiety/depression (somewhat controlled); lives alone   How long can you sit comfortably? 20-30 min due to thalamic pain   How long can you stand comfortably? 10 min max due to knee pain   How long can you walk comfortably? uses RW, about 500 feet   Diagnostic tests MRI prior to surgery showed medial meniscus injury   Patient Stated Goals "healing correctly, improve strength; walk without AD, improve balance;    Currently in Pain? Yes   Pain Score 7    Pain Location Knee   Pain Orientation Left   Pain Type Surgical pain       THERE-EX NuStep L2 x 5 minutes for warm-up during history (2 minutes unbilled); Quad sets 5 second hold x 10 repetitions; Quad sets 5 second hold x 10  with IVC to R knee to prevent quad inhibition x 10, discontinued following first 10 reps due to increase in thalamic pain; Supine SLR 2 x 5 with slow eccentric control, mild extensor lag noted during SLR; R sidelying clams for L hip ER x 10 with manual resistance; Seated LAQs with bodyweight x 10, manual resistance x 10, increased manual resistance x 10, extensor lag noted; Seated resisted L knee flexion for HS strengthening latex-free red Tband 2 x 10; Standing TKE with red latex-free Tband 2 x 10 with cues for quad contraction at end range extension; [employer] bodyweight squats 2 x 10; Manual stretching of L PFs into  DF 30 seconds x 2 to help relieve cramps in calf;                          PT Education - 10/21/15 0910    Education provided Yes   Education Details HEP reinforced. extensor lag, signs of infection, follow-up with orthopedics regarding swelling   Person(s) Educated Patient   Methods Explanation   Comprehension Verbalized understanding             PT Long Term Goals - 10/13/15 1630    PT LONG TERM GOAL #1   Title Patient will be independent in home exercise program to improve strength/mobility for better functional independence with ADLs. by 11/24/15   Time 6   Period Weeks   Status New   PT LONG TERM GOAL #2   Title Patient will increase 10 meter walk test to >1.36m/s as to improve gait speed for better community ambulation and to reduce fall risk. by 11/24/15   Time 6   Period Weeks   Status New   PT LONG TERM GOAL #3   Title Patient will report being able to stand for at least 20 min without significant increase in left knee pain for return to PLOF and independence in ADLs by 11/24/15   Time 6   Period Weeks   Status New   PT LONG TERM GOAL #4   Title Patient will increase LLE gross strength to 4+/5 as to improve functional strength for independent gait, increased standing tolerance and increased ADL ability. by 11/24/15   Time 6   Period Weeks   Status New   PT LONG TERM GOAL #5   Title Patient will increase lower extremity functional scale to >40/80 to demonstrate improved functional mobility and increased tolerance with ADLs. by 11/24/15   Time 6   Period Weeks   Status New   Additional Long Term Goals   Additional Long Term Goals Yes   PT LONG TERM GOAL #6   Title Patient will increase LLE knee AROM to 0-115 degrees for increased functional ROM for stair negotiation/transfers by 11/24/15   Time 6   Period Weeks   Status New   PT LONG TERM GOAL #7   Title Patient will tolerate 5 seconds of single leg stance without loss of balance to improve ability to  get in and out of shower safely. by 11/24/15   Time 6   Period Weeks   Status New               Plan - 10/21/15 0911    Clinical Impression Statement Pt reports continued improvement in balance since last therapy session. She has discontinued use of walker over the weekend but reports intermittent use of single point cane. Emphasized the importance of single point cane use  for additional stability and to prevent falls while knee is healing. Pt educated about signs of infection and encouraged pt to follow-up with orthpedist regarding knee swelling. No signs of surgical incision infection at this time and pt denies chills, fevers, discharge, redness, or night sweats. Extensor lag noted during quad sets and LAQ. Attempted IFC to knee during quad sets to prevent inhibition but pt unable to tolerate due to thalamic pain. Pt encouraged to continue HEP and follow-up as scheduled    Pt will benefit from skilled therapeutic intervention in order to improve on the following deficits Abnormal gait;Decreased endurance;Hypomobility;Increased edema;Decreased activity tolerance;Decreased strength;Pain;Difficulty walking;Decreased mobility;Decreased balance;Decreased range of motion;Impaired flexibility;Decreased safety awareness   Rehab Potential Fair   Clinical Impairments Affecting Rehab Potential positive: motivated, was able to walk independently prior to surgery, young in age; negative: co-morbidities, chronic pain; Patient's clinical presentation is evolving as she has a thalamic lesion with varying degrees of left sided pain and increased sensitivity to touch on left side of body;    PT Frequency 2x / week   PT Duration 6 weeks   PT Treatment/Interventions Aquatic Therapy;Cryotherapy;Electrical Stimulation;Moist Heat;Balance training;Therapeutic exercise;Therapeutic activities;Functional mobility training;Stair training;Gait training;DME Instruction;Ultrasound;Neuromuscular re-education;Patient/family  education;Manual techniques;Taping;Energy conservation;Dry needling;Passive range of motion   PT Next Visit Plan advance knee strengthening; focus on quad strengthening/knee extension   PT Home Exercise Plan Continue as prescribed   Consulted and Agree with Plan of Care Patient        Problem List Patient Active Problem List   Diagnosis Date Noted  . Pre-operative cardiovascular examination 09/21/2015  . Left knee pain 08/03/2015  . 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 CTS (carpal tunnel syndrome) 02/25/2015  . Paresthesias 02/02/2015  . Demyelinating nervous system disease or syndrome ([place]) 01/20/2015  . Demyelinating disease of central nervous system ([place]) 03/01/2012  . Thalamic pain syndrome (hyperesthetic) 09/07/2010  . Hyperlipidemia 05/28/2010  . OBESITY 02/26/2010  . Herpes simplex virus (HSV) infection 12/21/2009  . ALLERGIC RHINITIS 12/21/2009  . BACK PAIN, THORACIC REGION 11/11/2009  . Diabetes mellitus type 2 in obese ([place]) 07/28/2008  . Anxiety state 07/03/2008  . AMENORRHEA 12/17/2007  . DISORDER, BIPOLAR, ATYPICAL MANIC 05/14/2007   Phillips Grout PT, DPT   Kreed Kauffman 10/21/2015, 9:17 AM  [facility] [address] [place], [address] Phone: [phone]   Fax:  [phone]  Name: Amanda Vasquez MRN: [id number] Date of Birth: 1971-04-12

## 2015-10-22 ENCOUNTER — Ambulatory Visit: Payer: BLUE CROSS/BLUE SHIELD | Attending: Orthopedic Surgery | Admitting: Physical Therapy

## 2015-10-22 DIAGNOSIS — R531 Weakness: Secondary | ICD-10-CM | POA: Diagnosis present

## 2015-10-22 DIAGNOSIS — R262 Difficulty in walking, not elsewhere classified: Secondary | ICD-10-CM | POA: Diagnosis present

## 2015-10-22 DIAGNOSIS — M25662 Stiffness of left knee, not elsewhere classified: Secondary | ICD-10-CM

## 2015-10-22 DIAGNOSIS — M25562 Pain in left knee: Secondary | ICD-10-CM | POA: Diagnosis present

## 2015-10-22 NOTE — Therapy (Signed)
[facility] [address] [place], [address] Phone: [phone]   Fax:  [phone]  Physical Therapy Treatment  Patient Details  Name: Amanda Vasquez MRN: [id number] Date of Birth: 05-27-1971 Referring Provider: Rod Can MD  Encounter Date: 10/22/2015      PT End of Session - 10/22/15 1436    Visit Number 4   Number of Visits 13   Date for PT Re-Evaluation 11/24/15   PT Start Time 1404   PT Stop Time 1432   PT Time Calculation (min) 28 min   Activity Tolerance Patient tolerated treatment well   Behavior During Therapy [facility] for tasks assessed/performed      Past Medical History  Diagnosis Date  . 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 Migraine   . Demyelinating disorder ([place])     brain lesion that touches thalamic  . Knee pain   . Dyslipidemia   . Allergic rhinitis   . Generalized anxiety disorder     mostly controlled  . History of colitis   . History of adenomatous polyp of colon   . Acute meniscal tear of left knee   . General weakness     left hand and leg  . Chronic constipation   . Wears glasses   . Diabetes mellitus, type II ([place])     controlled with medication;    Past Surgical History  Procedure Laterality Date  . Breast reduction surgery Bilateral 1997  . Cholecystectomy  1998  . Transthoracic echocardiogram  03-02-2015    normal LVF,  ef 65-70%  . Knee arthroscopy Right 1993  . Knee arthroscopy with medial menisectomy Left 10/09/2015    Procedure: LEFT KNEE ARTHROSCOPY WITH PARTIAL MEDIAL MENISCECTOMY; PATELLA-FEMORAL CHONDROPLASTY;  Surgeon: Rod Can, MD;  Location: [facility];  Service: Orthopedics;  Laterality: Left;    There were no  vitals filed for this visit.  Visit Diagnosis:  Left knee pain  Difficulty walking  Knee stiffness, left      Subjective Assessment - 10/22/15 1431    Subjective Patient reports she saw her orthopedist and had 60 cc of synovial fluid drained and feels much better. She has discontinued use of all DME, she is able to complete sit to stands without pain currently.    Pertinent History history of thalamic pain; seeing chronic pain specialist; anxiety/depression (somewhat controlled); lives alone   How long can you sit comfortably? 20-30 min due to thalamic pain   How long can you stand comfortably? 10 min max due to knee pain   How long can you walk comfortably? uses RW, about 500 feet   Diagnostic tests MRI prior to surgery showed medial meniscus injury   Patient Stated Goals "healing correctly, improve strength; walk without AD, improve balance;    Currently in Pain? Yes  More complaints of clicking, popping, grinding around medial knee.      Lateral glides of patella x 15 " for 4 bouts (no pain or restriction with medial, increased pain initially with lateral) Decrease in clicking after this.   Stair ascent/descent x 2 bouts able to ascend reciprocally, step to pattern on descent. No increase in pain reported.   Single leg stance on LLE 9.6 seconds  Sit to stand  without red t-band (favoring R sided weight bearing) added red t-band (latex free) x 10 for 2 sets (able to increase WBing on LLE)   Superior glides of patella x 15" for 3 bouts --> no clicking, popping, grinding during ambulation after. (Superior glides initially deemed painful by patient)                             PT Education - 10/22/15 1435    Education provided Yes   Education Details Improvement in gait quality, use of t-band with sit to stand, patellar mobilizations.    Person(s) Educated Patient   Methods Explanation;Demonstration;Handout   Comprehension Verbalized understanding;Returned  demonstration             PT Long Term Goals - 10/22/15 1414    PT LONG TERM GOAL #1   Title Patient will be independent in home exercise program to improve strength/mobility for better functional independence with ADLs. by 11/24/15   Time 6   Period Weeks   Status New   PT LONG TERM GOAL #2   Title Patient will increase 10 meter walk test to >1.44m/s as to improve gait speed for better community ambulation and to reduce fall risk. by 11/24/15   Time 6   Period Weeks   Status New   PT LONG TERM GOAL #3   Title Patient will report being able to stand for at least 20 min without significant increase in left knee pain for return to PLOF and independence in ADLs by 11/24/15   Time 6   Period Weeks   Status New   PT LONG TERM GOAL #4   Title Patient will increase LLE gross strength to 4+/5 as to improve functional strength for independent gait, increased standing tolerance and increased ADL ability. by 11/24/15   Time 6   Period Weeks   Status New   PT LONG TERM GOAL #5   Title Patient will increase lower extremity functional scale to >40/80 to demonstrate improved functional mobility and increased tolerance with ADLs. by 11/24/15   Time 6   Period Weeks   Status New   PT LONG TERM GOAL #6   Title Patient will increase LLE knee AROM to 0-115 degrees for increased functional ROM for stair negotiation/transfers by 11/24/15   Time 6   Period Weeks   Status New   PT LONG TERM GOAL #7   Title Patient will tolerate 5 seconds of single leg stance without loss of balance to improve ability to get in and out of shower safely. by 11/24/15   Baseline 9.6 seconds on LLE on 3/2   Time 6   Period Weeks   Status Achieved               Plan - 10/22/15 1436    Clinical Impression Statement Patient demonstrates significant improvement in quadriceps force output after drainage performed. She continues to favor her R side in sit to stand and ambulation, though gait mechanics significantly improved  from iniital session with this PT. Patient would benefit from additional mobilization and strengthening of her LLE as tolerated.    Pt will benefit from skilled therapeutic intervention in order to improve on the following deficits Abnormal gait;Decreased endurance;Hypomobility;Increased edema;Decreased activity tolerance;Decreased strength;Pain;Difficulty walking;Decreased mobility;Decreased balance;Decreased range of motion;Impaired flexibility;Decreased safety awareness   Rehab Potential Fair   Clinical Impairments Affecting Rehab Potential positive: motivated, was able to walk independently prior to surgery, young in  age; negative: co-morbidities, chronic pain; Patient's clinical presentation is evolving as she has a thalamic lesion with varying degrees of left sided pain and increased sensitivity to touch on left side of body;    PT Frequency 2x / week   PT Duration 6 weeks   PT Treatment/Interventions Aquatic Therapy;Cryotherapy;Electrical Stimulation;Moist Heat;Balance training;Therapeutic exercise;Therapeutic activities;Functional mobility training;Stair training;Gait training;DME Instruction;Ultrasound;Neuromuscular re-education;Patient/family education;Manual techniques;Taping;Energy conservation;Dry needling;Passive range of motion   PT Next Visit Plan advance knee strengthening; focus on quad strengthening/knee extension   PT Home Exercise Plan Continue as prescribed   Consulted and Agree with Plan of Care Patient        Problem List Patient Active Problem List   Diagnosis Date Noted  . Pre-operative cardiovascular examination 09/21/2015  . Left knee pain 08/03/2015  . 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 CTS (carpal tunnel syndrome) 02/25/2015  . Paresthesias 02/02/2015  . Demyelinating nervous system disease or syndrome ([place]) 01/20/2015  . Demyelinating disease of central nervous system ([place]) 03/01/2012  . Thalamic pain syndrome (hyperesthetic) 09/07/2010  . Hyperlipidemia  05/28/2010  . OBESITY 02/26/2010  . Herpes simplex virus (HSV) infection 12/21/2009  . ALLERGIC RHINITIS 12/21/2009  . BACK PAIN, THORACIC REGION 11/11/2009  . Diabetes mellitus type 2 in obese ([place]) 07/28/2008  . Anxiety state 07/03/2008  . AMENORRHEA 12/17/2007  . DISORDER, BIPOLAR, ATYPICAL MANIC 05/14/2007   Kerman Passey, PT, DPT    10/22/2015, 6:49 PM  [facility] [address], [place], [address] Phone: [phone]   Fax:  [phone]  Name: Amanda Vasquez MRN: [id number] Date of Birth: [birth year]

## 2015-10-27 ENCOUNTER — Ambulatory Visit: Payer: BLUE CROSS/BLUE SHIELD | Admitting: Physical Therapy

## 2015-10-27 DIAGNOSIS — M25562 Pain in left knee: Secondary | ICD-10-CM

## 2015-10-27 DIAGNOSIS — M25662 Stiffness of left knee, not elsewhere classified: Secondary | ICD-10-CM

## 2015-10-27 DIAGNOSIS — R531 Weakness: Secondary | ICD-10-CM

## 2015-10-27 NOTE — Therapy (Signed)
[place] [facility] [address], [place], [address] Phone: [phone]   Fax:  [phone]  Physical Therapy Treatment  Patient Details  Name: Amanda Vasquez MRN: [id number] Date of Birth: 05-21-1971 Referring Provider: Rod Can MD  Encounter Date: 10/27/2015      PT End of Session - 10/27/15 1532    Visit Number 5   Number of Visits 13   Date for PT Re-Evaluation 11/24/15   PT Start Time [id number]   PT Stop Time 1430   PT Time Calculation (min) 42 min   Activity Tolerance Patient tolerated treatment well   Behavior During Therapy [facility] for tasks assessed/performed      Past Medical History  Diagnosis Date  . 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 Migraine   . Demyelinating disorder ([place])     brain lesion that touches thalamic  . Knee pain   . Dyslipidemia   . Allergic rhinitis   . Generalized anxiety disorder     mostly controlled  . History of colitis   . History of adenomatous polyp of colon   . Acute meniscal tear of left knee   . General weakness     left hand and leg  . Chronic constipation   . Wears glasses   . Diabetes mellitus, type II ([place])     controlled with medication;    Past Surgical History  Procedure Laterality Date  . Breast reduction surgery Bilateral 1997  . Cholecystectomy  1998  . Transthoracic echocardiogram  03-02-2015    normal LVF,  ef 65-70%  . Knee arthroscopy Right 1993  . Knee arthroscopy with medial menisectomy Left 10/09/2015    Procedure: LEFT KNEE ARTHROSCOPY WITH PARTIAL MEDIAL MENISCECTOMY; PATELLA-FEMORAL CHONDROPLASTY;  Surgeon: Rod Can, MD;  Location: [place];  Service: Orthopedics;  Laterality: Left;    There were no  vitals filed for this visit.  Visit Diagnosis:  Left knee pain  Knee stiffness, left  Weakness      Subjective Assessment - 10/27/15 1527    Subjective Patient reports she now really only has discomfort/catching with descending stairs. Otherwise she reports no surgical pain at this time, no use of DME.    Pertinent History history of thalamic pain; seeing chronic pain specialist; anxiety/depression (somewhat controlled); lives alone   Limitations Walking   How long can you sit comfortably? 20-30 min due to thalamic pain   How long can you stand comfortably? 10 min max due to knee pain   How long can you walk comfortably? uses RW, about 500 feet   Diagnostic tests MRI prior to surgery showed medial meniscus injury   Patient Stated Goals "healing correctly, improve strength; walk without AD, improve balance;    Currently in Pain? --  No pain at rest, discomfort over focal area around medial patella with descending steps/prolonged knee extension, or with mini-squats   Pain Location Knee   Pain Orientation Left   Pain Descriptors / Indicators Aching   Pain Type Surgical pain   Pain Onset 1 to 4 weeks ago   Pain Frequency Constant   Aggravating Factors  Mini-squats, ascending/descending steps.      Manual Therapy  PNF prone quadriceps stretching 3 bouts x 10 repetitions, initially started  at 90 degrees progressed with contract relax to beyond right angle. Notable decrease in symptoms with steps afterwards  Soft tissue mobilization over medial knee in area of complaints, reports of "it hurts so good", patient reports decreased symptoms afterwards with stair ascent/squats.   TherEx  Full squats to chair (began to hear pop at knee), reduced ROM to mini-partial squats with cuing for hip hinging to initiate, x 8 for 2 sets. Patient reported less pain around medial portion of knee.  Ascend/descend steps -- used this as reproducible sign as patient had stated she had pain with this. Decreased  throughout the session. Patient complained of some crmaping in her L calf. Performed 3 sets of 10 repetitions of stair calf stretching to calf raises with HHA (cuing for set up and technique). Progressed to double leg stretch to single calf raises.                            PT Education - 10/27/15 1531    Education provided Yes   Education Details Progressed HEP, to increase soft tissue mobilization over her painful area.    Person(s) Educated Patient   Methods Explanation;Demonstration;Handout   Comprehension Verbalized understanding;Returned demonstration             PT Long Term Goals - 10/22/15 1414    PT LONG TERM GOAL #1   Title Patient will be independent in home exercise program to improve strength/mobility for better functional independence with ADLs. by 11/24/15   Time 6   Period Weeks   Status New   PT LONG TERM GOAL #2   Title Patient will increase 10 meter walk test to >1.52m/s as to improve gait speed for better community ambulation and to reduce fall risk. by 11/24/15   Time 6   Period Weeks   Status New   PT LONG TERM GOAL #3   Title Patient will report being able to stand for at least 20 min without significant increase in left knee pain for return to PLOF and independence in ADLs by 11/24/15   Time 6   Period Weeks   Status New   PT LONG TERM GOAL #4   Title Patient will increase LLE gross strength to 4+/5 as to improve functional strength for independent gait, increased standing tolerance and increased ADL ability. by 11/24/15   Time 6   Period Weeks   Status New   PT LONG TERM GOAL #5   Title Patient will increase lower extremity functional scale to >40/80 to demonstrate improved functional mobility and increased tolerance with ADLs. by 11/24/15   Time 6   Period Weeks   Status New   PT LONG TERM GOAL #6   Title Patient will increase LLE knee AROM to 0-115 degrees for increased functional ROM for stair negotiation/transfers by 11/24/15    Time 6   Period Weeks   Status New   PT LONG TERM GOAL #7   Title Patient will tolerate 5 seconds of single leg stance without loss of balance to improve ability to get in and out of shower safely. by 11/24/15   Baseline 9.6 seconds on LLE on 3/2   Time 6   Period Weeks   Status Achieved               Plan - 10/27/15 1533    Clinical Impression Statement Patient reports significantly improved function, balance, and LE strength from previous sessions. She reports sharp  pain over one spot on medial side of L knee, reduced with quadricep stretching as well as gentle soft tissue mobilization over the area. She displays some soft tissue restrictions in prone quadriceps stretching, relieved with stretching. Patient would benefit from continued skilled PT services to address her pain symptoms.    Pt will benefit from skilled therapeutic intervention in order to improve on the following deficits Abnormal gait;Decreased endurance;Hypomobility;Increased edema;Decreased activity tolerance;Decreased strength;Pain;Difficulty walking;Decreased mobility;Decreased balance;Decreased range of motion;Impaired flexibility;Decreased safety awareness   Rehab Potential Fair   Clinical Impairments Affecting Rehab Potential positive: motivated, was able to walk independently prior to surgery, young in age; negative: co-morbidities, chronic pain; Patient's clinical presentation is evolving as she has a thalamic lesion with varying degrees of left sided pain and increased sensitivity to touch on left side of body;    PT Frequency 2x / week   PT Duration 6 weeks   PT Treatment/Interventions Aquatic Therapy;Cryotherapy;Electrical Stimulation;Moist Heat;Balance training;Therapeutic exercise;Therapeutic activities;Functional mobility training;Stair training;Gait training;DME Instruction;Ultrasound;Neuromuscular re-education;Patient/family education;Manual techniques;Taping;Energy conservation;Dry needling;Passive range of  motion   PT Next Visit Plan Focus on terminal quad strengthening, increasing depth in squats, stair ascent/descent as needed.    PT Home Exercise Plan Continue as prescribed   Consulted and Agree with Plan of Care Patient        Problem List Patient Active Problem List   Diagnosis Date Noted  . Pre-operative cardiovascular examination 09/21/2015  . Left knee pain 08/03/2015  . 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 CTS (carpal tunnel syndrome) 02/25/2015  . Paresthesias 02/02/2015  . Demyelinating nervous system disease or syndrome ([place]) 01/20/2015  . Demyelinating disease of central nervous system ([place]) 03/01/2012  . Thalamic pain syndrome (hyperesthetic) 09/07/2010  . Hyperlipidemia 05/28/2010  . OBESITY 02/26/2010  . Herpes simplex virus (HSV) infection 12/21/2009  . ALLERGIC RHINITIS 12/21/2009  . BACK PAIN, THORACIC REGION 11/11/2009  . Diabetes mellitus type 2 in obese ([place]) 07/28/2008  . Anxiety state 07/03/2008  . AMENORRHEA 12/17/2007  . DISORDER, BIPOLAR, ATYPICAL MANIC 05/14/2007   Kerman Passey, PT, DPT    10/27/2015, 3:39 PM  [place] [facility] [address] [place], [address] Phone: [phone]   Fax:  [phone]  Name: Amanda Vasquez MRN: [id number] Date of Birth: July 30, 1971

## 2015-10-29 ENCOUNTER — Ambulatory Visit: Payer: BLUE CROSS/BLUE SHIELD | Admitting: Physical Therapy

## 2015-10-29 DIAGNOSIS — M25662 Stiffness of left knee, not elsewhere classified: Secondary | ICD-10-CM

## 2015-10-29 DIAGNOSIS — R262 Difficulty in walking, not elsewhere classified: Secondary | ICD-10-CM

## 2015-10-29 DIAGNOSIS — M25562 Pain in left knee: Secondary | ICD-10-CM | POA: Diagnosis not present

## 2015-10-29 NOTE — Therapy (Signed)
[place] [facility] [address], [place], [address] Phone: [phone]   Fax:  [phone]  Physical Therapy Treatment  Patient Details  Name: Amanda Vasquez MRN: [id number] Date of Birth: 1970-09-23 Referring Provider: Rod Can MD  Encounter Date: 10/29/2015      PT End of Session - 10/29/15 1432    Visit Number 6   Number of Visits 13   Date for PT Re-Evaluation 11/24/15   PT Start Time 1346   PT Stop Time 1430   PT Time Calculation (min) 44 min   Activity Tolerance Patient tolerated treatment well   Behavior During Therapy [facility] for tasks assessed/performed      Past Medical History  Diagnosis Date  . 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 Migraine   . Demyelinating disorder ([place])     brain lesion that touches thalamic  . Knee pain   . Dyslipidemia   . Allergic rhinitis   . Generalized anxiety disorder     mostly controlled  . History of colitis   . History of adenomatous polyp of colon   . Acute meniscal tear of left knee   . General weakness     left hand and leg  . Chronic constipation   . Wears glasses   . Diabetes mellitus, type II ([place])     controlled with medication;    Past Surgical History  Procedure Laterality Date  . Breast reduction surgery Bilateral 1997  . Cholecystectomy  1998  . Transthoracic echocardiogram  03-02-2015    normal LVF,  ef 65-70%  . Knee arthroscopy Right 1993  . Knee arthroscopy with medial menisectomy Left 10/09/2015    Procedure: LEFT KNEE ARTHROSCOPY WITH PARTIAL MEDIAL MENISCECTOMY; PATELLA-FEMORAL CHONDROPLASTY;  Surgeon: Rod Can, MD;  Location: [place];  Service: Orthopedics;  Laterality: Left;    There were no  vitals filed for this visit.  Visit Diagnosis:  Left knee pain  Knee stiffness, left  Difficulty walking      Subjective Assessment - 10/29/15 1348    Subjective Patient reports today is a bad "thalamapic pain" day, it was all she could do to get here.    Pertinent History history of thalamic pain; seeing chronic pain specialist; anxiety/depression (somewhat controlled); lives alone   Limitations Walking   How long can you sit comfortably? 20-30 min due to thalamic pain   How long can you stand comfortably? 10 min max due to knee pain   How long can you walk comfortably? uses RW, about 500 feet   Diagnostic tests MRI prior to surgery showed medial meniscus injury   Patient Stated Goals "healing correctly, improve strength; walk without AD, improve balance;    Currently in Pain? --  Patient reports pain in her L calf, medially. Unclear if this is thalamic or not. No hotness/redness or discoloration noted.       Patient reported tenderness in posterior knee, distal to joint.   Not relieved with either 90-90 HS stretching or manual DF with knee extended for gastroc.   Soft tissue mobilization extensively throughout medial mid to proximal gastroc and distal hamstring tendons with trigger points identified, mild relief of symptoms Multiple bouts performed throughout musculature as patient has poor two point discrimination  due to thalamic issues.   Standing calf stretching x 10 for 2 bouts with reduction from moderate to mild symptoms.   Standing hip abductions x 12 bilaterally with yellow t-band                             PT Education - 10/29/15 1431    Education provided Yes   Education Details Progression to strengthening portion of program as calf pain resolves.    Person(s) Educated Patient   Methods Explanation;Demonstration;Handout   Comprehension Verbalized understanding;Returned demonstration             PT Long Term Goals - 10/22/15 1414     PT LONG TERM GOAL #1   Title Patient will be independent in home exercise program to improve strength/mobility for better functional independence with ADLs. by 11/24/15   Time 6   Period Weeks   Status New   PT LONG TERM GOAL #2   Title Patient will increase 10 meter walk test to >1.23m/s as to improve gait speed for better community ambulation and to reduce fall risk. by 11/24/15   Time 6   Period Weeks   Status New   PT LONG TERM GOAL #3   Title Patient will report being able to stand for at least 20 min without significant increase in left knee pain for return to PLOF and independence in ADLs by 11/24/15   Time 6   Period Weeks   Status New   PT LONG TERM GOAL #4   Title Patient will increase LLE gross strength to 4+/5 as to improve functional strength for independent gait, increased standing tolerance and increased ADL ability. by 11/24/15   Time 6   Period Weeks   Status New   PT LONG TERM GOAL #5   Title Patient will increase lower extremity functional scale to >40/80 to demonstrate improved functional mobility and increased tolerance with ADLs. by 11/24/15   Time 6   Period Weeks   Status New   PT LONG TERM GOAL #6   Title Patient will increase LLE knee AROM to 0-115 degrees for increased functional ROM for stair negotiation/transfers by 11/24/15   Time 6   Period Weeks   Status New   PT LONG TERM GOAL #7   Title Patient will tolerate 5 seconds of single leg stance without loss of balance to improve ability to get in and out of shower safely. by 11/24/15   Baseline 9.6 seconds on LLE on 3/2   Time 6   Period Weeks   Status Achieved               Plan - 10/29/15 1432    Clinical Impression Statement Patient is reporting less pain in her knee, more complaints today centering around L calf which she initially injured in December. This could be exacerbated as well by thalamic aggravation today. Patient has progressed well with knee pain thus far unclear what the etiology of her  calf pain is at this point and how it relates to gait. Patient provided with initial quadricep and hip abductor strengthening program to be progressed prior to discharge.    Pt will benefit from skilled therapeutic intervention in order to improve on the following deficits Abnormal gait;Decreased endurance;Hypomobility;Increased edema;Decreased activity tolerance;Decreased strength;Pain;Difficulty walking;Decreased mobility;Decreased balance;Decreased range of motion;Impaired flexibility;Decreased safety awareness   Rehab Potential Fair   Clinical Impairments Affecting Rehab Potential positive: motivated, was able to walk independently  prior to surgery, young in age; negative: co-morbidities, chronic pain; Patient's clinical presentation is evolving as she has a thalamic lesion with varying degrees of left sided pain and increased sensitivity to touch on left side of body;    PT Frequency 2x / week   PT Duration 6 weeks   PT Treatment/Interventions Aquatic Therapy;Cryotherapy;Electrical Stimulation;Moist Heat;Balance training;Therapeutic exercise;Therapeutic activities;Functional mobility training;Stair training;Gait training;DME Instruction;Ultrasound;Neuromuscular re-education;Patient/family education;Manual techniques;Taping;Energy conservation;Dry needling;Passive range of motion   PT Next Visit Plan Focus on terminal quad strengthening, increasing depth in squats, stair ascent/descent as needed.    PT Home Exercise Plan Continue as prescribed   Consulted and Agree with Plan of Care Patient        Problem List Patient Active Problem List   Diagnosis Date Noted  . Pre-operative cardiovascular examination 09/21/2015  . Left knee pain 08/03/2015  . 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 CTS (carpal tunnel syndrome) 02/25/2015  . Paresthesias 02/02/2015  . Demyelinating nervous system disease or syndrome ([place]) 01/20/2015  . Demyelinating disease of central nervous system ([place]) 03/01/2012  .  Thalamic pain syndrome (hyperesthetic) 09/07/2010  . Hyperlipidemia 05/28/2010  . OBESITY 02/26/2010  . Herpes simplex virus (HSV) infection 12/21/2009  . ALLERGIC RHINITIS 12/21/2009  . BACK PAIN, THORACIC REGION 11/11/2009  . Diabetes mellitus type 2 in obese ([place]) 07/28/2008  . Anxiety state 07/03/2008  . AMENORRHEA 12/17/2007  . DISORDER, BIPOLAR, ATYPICAL MANIC 05/14/2007   Kerman Passey, PT, DPT    10/29/2015, 6:34 PM  [facility] [address] [place], [address] Phone: [phone]   Fax:  [phone]  Name: ERIOLUWA VENTURINO MRN: [id number] Date of Birth: 1971/01/31

## 2015-11-03 ENCOUNTER — Ambulatory Visit: Payer: BLUE CROSS/BLUE SHIELD | Admitting: Physical Therapy

## 2015-11-03 DIAGNOSIS — M25562 Pain in left knee: Secondary | ICD-10-CM

## 2015-11-03 DIAGNOSIS — M25662 Stiffness of left knee, not elsewhere classified: Secondary | ICD-10-CM

## 2015-11-03 DIAGNOSIS — R262 Difficulty in walking, not elsewhere classified: Secondary | ICD-10-CM

## 2015-11-03 NOTE — Therapy (Signed)
[facility] [address] [place], [address] Phone: [phone]   Fax:  [phone]  Physical Therapy Treatment/Discharge Summary  Patient Details  Name: Amanda Vasquez MRN: [id number] Date of Birth: Jan 15, 1971 Referring Provider: Rod Can MD  Encounter Date: 11/03/2015      PT End of Session - 11/03/15 1428    Visit Number 7   Number of Visits 13   Date for PT Re-Evaluation 11/24/15   PT Start Time 1346   PT Stop Time 1427   PT Time Calculation (min) 41 min   Activity Tolerance Patient tolerated treatment well   Behavior During Therapy [facility] for tasks assessed/performed      Past Medical History  Diagnosis Date  . 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 Migraine   . Demyelinating disorder ([place])     brain lesion that touches thalamic  . Knee pain   . Dyslipidemia   . Allergic rhinitis   . Generalized anxiety disorder     mostly controlled  . History of colitis   . History of adenomatous polyp of colon   . Acute meniscal tear of left knee   . General weakness     left hand and leg  . Chronic constipation   . Wears glasses   . Diabetes mellitus, type II ([place])     controlled with medication;    Past Surgical History  Procedure Laterality Date  . Breast reduction surgery Bilateral 1997  . Cholecystectomy  1998  . Transthoracic echocardiogram  03-02-2015    normal LVF,  ef 65-70%  . Knee arthroscopy Right 1993  . Knee arthroscopy with medial menisectomy Left 10/09/2015    Procedure: LEFT KNEE ARTHROSCOPY WITH PARTIAL MEDIAL MENISCECTOMY; PATELLA-FEMORAL CHONDROPLASTY;  Surgeon: Rod Can, MD;  Location: [facility];  Service: Orthopedics;  Laterality: Left;     There were no vitals filed for this visit.  Visit Diagnosis:  Left knee pain  Knee stiffness, left  Difficulty walking      Subjective Assessment - 11/03/15 1406    Subjective Patient reports her calf pain is resolved today, she has minimal complaints over her L knee now, just tightness with stair descent.    Pertinent History history of thalamic pain; seeing chronic pain specialist; anxiety/depression (somewhat controlled); lives alone   Limitations Walking   How long can you sit comfortably? 20-30 min due to thalamic pain   How long can you stand comfortably? 10 min max due to knee pain   How long can you walk comfortably? uses RW, about 500 feet   Diagnostic tests MRI prior to surgery showed medial meniscus injury   Patient Stated Goals "healing correctly, improve strength; walk without AD, improve balance;    Currently in Pain? No/denies     TherEx Ascend/descend steps (pulling on the medial knee space)   Standing quadricep stretching 2 bouts x 1 minute of alternating between stretch and non-stretch.   Manual stretching of quadricep in sitting (reproduced medial knee complaints) x 1 minute for 2 bouts.   Mini step ups to blue foam pad with emphasis on quad set at the end range of extension. 2 sets x 15 repetitions.    Single knee to chest 2 bouts x 10 repetitions (  5" holds)   90-90 Hamstring stretching 2 bouts x 10 repetitions (5" holds)                          PT Education - 11/03/15 1427    Education provided Yes   Education Details HEP for LE strengthening and stretching program.    Person(s) Educated Patient   Methods Explanation;Demonstration;Handout   Comprehension Verbalized understanding;Returned demonstration             PT Long Term Goals - 11/03/15 1351    PT LONG TERM GOAL #1   Title Patient will be independent in home exercise program to improve strength/mobility for better functional independence with ADLs. by 11/24/15    Time 6   Period Weeks   Status Achieved   PT LONG TERM GOAL #2   Title Patient will increase 10 meter walk test to >1.2ms as to improve gait speed for better community ambulation and to reduce fall risk. by 11/24/15   Time 6   Period Weeks   Status New   PT LONG TERM GOAL #3   Title Patient will report being able to stand for at least 20 min without significant increase in left knee pain for return to PLOF and independence in ADLs by 11/24/15   Time 6   Period Weeks   Status On-going   PT LONG TERM GOAL #4   Title Patient will increase LLE gross strength to 4+/5 as to improve functional strength for independent gait, increased standing tolerance and increased ADL ability. by 11/24/15   Time 6   Period Weeks   Status Achieved   PT LONG TERM GOAL #5   Title Patient will increase lower extremity functional scale to >40/80 to demonstrate improved functional mobility and increased tolerance with ADLs. by 11/24/15   Time 6   Period Weeks   Status New   PT LONG TERM GOAL #6   Title Patient will increase LLE knee AROM to 0-115 degrees for increased functional ROM for stair negotiation/transfers by 11/24/15   Baseline 138 degrees flexion, 0 at extension.    Time 6   Period Weeks   Status Achieved   PT LONG TERM GOAL #7   Title Patient will tolerate 5 seconds of single leg stance without loss of balance to improve ability to get in and out of shower safely. by 11/24/15   Baseline 9.6 seconds on LLE on 3/2   Time 6   Period Weeks   Status Achieved               Plan - 11/03/15 1428    Clinical Impression Statement Patient now reports minimal complaints with her day to day mobility. She demonstrates the ability to complete household mobility without significant increase in knee pain at this time and has met most of her PT related goals. Patient agrees she has an appropriate HEP for maintenance and is appropriate for discharge.    Pt will benefit from skilled therapeutic intervention in order  to improve on the following deficits Abnormal gait;Decreased endurance;Hypomobility;Increased edema;Decreased activity tolerance;Decreased strength;Pain;Difficulty walking;Decreased mobility;Decreased balance;Decreased range of motion;Impaired flexibility;Decreased safety awareness   Rehab Potential Fair   Clinical Impairments Affecting Rehab Potential positive: motivated, was able to walk independently prior to surgery, young in age; negative: co-morbidities, chronic pain; Patient's clinical presentation is evolving as she has a thalamic lesion with varying degrees of left sided pain and increased sensitivity to touch on left side of  body;    PT Frequency 2x / week   PT Duration 6 weeks   PT Treatment/Interventions Aquatic Therapy;Cryotherapy;Electrical Stimulation;Moist Heat;Balance training;Therapeutic exercise;Therapeutic activities;Functional mobility training;Stair training;Gait training;DME Instruction;Ultrasound;Neuromuscular re-education;Patient/family education;Manual techniques;Taping;Energy conservation;Dry needling;Passive range of motion   PT Next Visit Plan Discharged.    Consulted and Agree with Plan of Care Patient        Problem List Patient Active Problem List   Diagnosis Date Noted  . Pre-operative cardiovascular examination 09/21/2015  . Left knee pain 08/03/2015  . 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 CTS (carpal tunnel syndrome) 02/25/2015  . Paresthesias 02/02/2015  . Demyelinating nervous system disease or syndrome ([place]) 01/20/2015  . Demyelinating disease of central nervous system ([place]) 03/01/2012  . Thalamic pain syndrome (hyperesthetic) 09/07/2010  . Hyperlipidemia 05/28/2010  . OBESITY 02/26/2010  . Herpes simplex virus (HSV) infection 12/21/2009  . ALLERGIC RHINITIS 12/21/2009  . BACK PAIN, THORACIC REGION 11/11/2009  . Diabetes mellitus type 2 in obese ([place]) 07/28/2008  . Anxiety state 07/03/2008  . AMENORRHEA 12/17/2007  . DISORDER, BIPOLAR, ATYPICAL  MANIC 05/14/2007   Kerman Passey, PT, DPT    11/03/2015, 3:40 PM  [facility] [address], [place], [address] Phone: [phone]   Fax:  [phone]  Name: NINETTE COTTA MRN: [id number] Date of Birth: Jun 07, 1971

## 2015-11-05 ENCOUNTER — Ambulatory Visit: Payer: BLUE CROSS/BLUE SHIELD | Admitting: Physical Therapy

## 2015-11-10 ENCOUNTER — Encounter: Payer: BLUE CROSS/BLUE SHIELD | Admitting: Physical Therapy

## 2015-11-10 ENCOUNTER — Other Ambulatory Visit: Payer: Self-pay | Admitting: Family Medicine

## 2015-11-11 NOTE — Telephone Encounter (Signed)
Last labs abnormal. pls advise 

## 2015-11-12 ENCOUNTER — Encounter: Payer: BLUE CROSS/BLUE SHIELD | Admitting: Physical Therapy

## 2015-11-17 ENCOUNTER — Encounter: Payer: BLUE CROSS/BLUE SHIELD | Admitting: Physical Therapy

## 2015-11-19 ENCOUNTER — Encounter: Payer: BLUE CROSS/BLUE SHIELD | Admitting: Physical Therapy

## 2015-12-15 ENCOUNTER — Ambulatory Visit (INDEPENDENT_AMBULATORY_CARE_PROVIDER_SITE_OTHER): Payer: BLUE CROSS/BLUE SHIELD | Admitting: Psychiatry

## 2015-12-15 ENCOUNTER — Encounter: Payer: Self-pay | Admitting: Psychiatry

## 2015-12-15 VITALS — BP 118/68 | HR 115 | Temp 97.7°F | Ht 65.0 in | Wt 225.2 lb

## 2015-12-15 DIAGNOSIS — F32A Depression, unspecified: Secondary | ICD-10-CM

## 2015-12-15 DIAGNOSIS — F329 Major depressive disorder, single episode, unspecified: Secondary | ICD-10-CM

## 2015-12-15 DIAGNOSIS — F411 Generalized anxiety disorder: Secondary | ICD-10-CM

## 2015-12-15 MED ORDER — DULOXETINE HCL 60 MG PO CPEP: 60.0000 mg | ORAL_CAPSULE | Freq: Every day | ORAL | Status: DC

## 2015-12-15 NOTE — Progress Notes (Signed)
Patient ID: HADLEE NIELSEN, female   DOB: 1970/09/11, 45 y.o.   MRN: [id number] [facility] MD/PA/NP OP Progress Note  12/15/2015 2:04 PM TAMRYN SCULL  MRN:  [id number]  Subjective:  Patient presents for follow-up of her generalized anxiety disorder, other specified depressive disorder and insomnia. Patient was previously seen by Dr. Jimmye Norman and this is the first visit for this patient with this clinician. She states that she was doing well a few months ago but since she had her knee surgery in February has been having more restricted mobility and her pain has caused her to become more depressed . On a scale of 1-10 with 10 being her best mood and one the lowest she reports being at a 5 or 6 Reports increased fatigue, lack of energy, feeling depressed over past 2 months. Had a good visit to her brother, just came back and feeling down. She denies any crying spells. Fair sleep and appetite. Denies any suicidal thoughts. Discussed with her that benzodiazepines are to be used more on an as-needed basis not as a regular regimen and patient is willing to taper.   Chief Complaint: Depressed Chief Complaint    Follow-up; Medication Refill     Visit Diagnosis:     ICD-9-CM ICD-10-CM   1. Generalized anxiety disorder 300.02 F41.1   2. Depression 311 F32.9     Past Medical History:  Past Medical History  Diagnosis Date  . 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 Migraine   . Demyelinating disorder ([place])     brain lesion that touches thalamic  . Knee pain   . Dyslipidemia   . Allergic rhinitis   . Generalized anxiety disorder     mostly controlled  . History of colitis   . History of adenomatous polyp of colon   . Acute meniscal tear of left knee   . General weakness     left hand and leg   . Chronic constipation   . Wears glasses   . Diabetes mellitus, type II ([place])     controlled with medication;    Past Surgical History  Procedure Laterality Date  . Breast reduction surgery Bilateral 1997  . Cholecystectomy  1998  . Transthoracic echocardiogram  03-02-2015    normal LVF,  ef 65-70%  . Knee arthroscopy Right 1993  . Knee arthroscopy with medial menisectomy Left 10/09/2015    Procedure: LEFT KNEE ARTHROSCOPY WITH PARTIAL MEDIAL MENISCECTOMY; PATELLA-FEMORAL CHONDROPLASTY;  Surgeon: Rod Can, MD;  Location: [facility];  Service: Orthopedics;  Laterality: Left;   Family History:  Family History  Problem Relation Age of Onset  . Adopted: Yes   Social History:  Social History   Social History  . Marital Status: Divorced    Spouse Name: N/A  . Number of Children: 0  . Years of Education: 12+   Occupational History  . RN- long-term disability     Social History Main Topics  . Smoking status: Former Smoker -- 0.25 packs/day for 20 years    Types: Cigarettes    Start date: 06/08/1986    Quit date: 08/22/2010  . Smokeless tobacco: Never Used  . Alcohol Use: No  . Drug Use: No  . Sexual Activity: Yes    Birth Control/ Protection: IUD  Comment: Mirena IUD placement Dec 2015   Other Topics Concern  . None   Social History Narrative   Divorced. Lives at home with herself.   Caffeine use: 1 cup coffee 3 times per week   Additional History:   Assessment:   Musculoskeletal: Strength & Muscle Tone: within normal limits Gait & Station: normal Patient leans: N/A  Psychiatric Specialty Exam: Anxiety Patient reports no insomnia, nervous/anxious behavior or suicidal ideas.      Review of Systems  Psychiatric/Behavioral: Negative for depression, suicidal ideas, hallucinations, memory loss and substance abuse. The patient is not nervous/anxious and does not have insomnia.   All other systems reviewed and are negative.   Blood  pressure 118/68, pulse 115, temperature 97.7 F (36.5 C), temperature source Tympanic, height 5\' 5"  (1.651 m), weight 225 lb 3.2 oz (102.15 kg), last menstrual period 12/15/2015, SpO2 91 %.Body mass index is 37.48 kg/(m^2).  General Appearance: Casually groomed   Eye Contact:  Good  Speech:  Normal Rate  Volume:  Normal  Mood:  Dysthymic   Affect:  Restricted   Thought Process:  Linear  Orientation:  Full (Time, Place, and Person)  Thought Content:  Negative  Suicidal Thoughts:  No  Homicidal Thoughts:  No  Memory:  Immediate;   Good Recent;   Good Remote;   Good  Judgement:  Good  Insight:  Good  Psychomotor Activity:  Negative  Concentration:  Good  Recall:  Good  Fund of Knowledge: Good  Language: Good  Akathisia:  Negative  Handed:    AIMS (if indicated):    Assets:  Communication Skills Desire for Improvement  ADL's:  Intact  Cognition: WNL  Sleep:  Okay    Is the patient at risk to self?  No. Has the patient been a risk to self in the past 6 months?  No. Has the patient been a risk to self within the distant past?  No. Is the patient a risk to others?  No. Has the patient been a risk to others in the past 6 months?  No. Has the patient been a risk to others within the distant past?  No.  Current Medications: Current Outpatient Prescriptions  Medication Sig Dispense Refill  . ALPRAZolam (XANAX) 0.5 MG tablet Take 1 tablet (0.5 mg total) by mouth 4 (four) times daily as needed for anxiety. 120 tablet 4  . aspirin EC 325 MG tablet Take 1 tablet (325 mg total) by mouth 2 (two) times daily after a meal. 60 tablet 0  . atorvastatin (LIPITOR) 10 MG tablet Take 1 tablet (10 mg total) by mouth daily. (Patient taking differently: Take 10 mg by mouth every evening. ) 90 tablet 3  . b complex vitamins tablet Take 1 tablet by mouth daily.      . baclofen (LIORESAL) 10 MG tablet Take 1 tablet (10 mg total) by mouth 3 (three) times daily. 30 each   . buPROPion (WELLBUTRIN XL)  300 MG 24 hr tablet Take 1 tablet (300 mg total) by mouth daily. Take 300 mg by mouth daily. (Patient taking differently: Take 300 mg by mouth every morning. Take 300 mg by mouth daily.) 30 tablet 4  . cefadroxil (DURICEF) 500 MG capsule Take 1 capsule (500 mg total) by mouth 2 (two) times daily. (Patient not taking: Reported on 10/13/2015) 2 capsule 0  . Cetirizine HCl 10 MG CAPS Take 1 capsule (10 mg total) by mouth daily as needed. Take by mouth. (Patient taking differently: Take 10 mg  by mouth every evening. Take by mouth.) 30 capsule   . Cholecalciferol (VITAMIN D3) 2000 UNITS TABS Take 100-2,000 Units by mouth daily.     . DULoxetine (CYMBALTA) 30 MG capsule Take 1 capsule (30 mg total) by mouth daily. (Patient taking differently: Take 30 mg by mouth every morning. ) 30 capsule 4  . famotidine (PEPCID) 20 MG tablet Take 1 tablet (20 mg total) by mouth 2 (two) times daily. 60 tablet 0  . glipiZIDE (GLUCOTROL) 5 MG tablet Take 1 tablet (5 mg total) by mouth daily. (Patient taking differently: Take 5 mg by mouth every morning. ) 90 tablet 3  . GRALISE 600 MG TABS Take 1,800 mg by mouth every evening.     . Liraglutide 18 MG/3ML SOPN Inject 0.1-0.2 mLs (0.6-1.2 mg total) into the skin daily. Week 1 take 0.6 mg daily, week 2 and after take 1.2 mg daily. (Patient taking differently: Inject 2 mg into the skin once a week. BYDUREON--  Weekly on Saturday's) 6 mL 3  . metFORMIN (GLUCOPHAGE) 1000 MG tablet TAKE ONE TABLET BY MOUTH TWICE DAILY WITH A MEAL 180 tablet 3  . morphine (MS CONTIN) 60 MG 12 hr tablet Take 60 mg by mouth every 12 (twelve) hours.    . multivitamin-iron-minerals-folic acid (CENTRUM) chewable tablet Chew 1 tablet by mouth daily.    . ondansetron (ZOFRAN) 4 MG tablet Take 1 tablet (4 mg total) by mouth every 8 (eight) hours as needed for nausea or vomiting. 20 tablet 0  . oxyCODONE (OXY IR/ROXICODONE) 5 MG immediate release tablet Take 1 tablet (5 mg total) by mouth every 4 (four) hours  as needed for severe pain. Per pain clinic Dr Hardin Negus 30 tablet   . polyethylene glycol (MIRALAX / GLYCOLAX) packet Take 17 g by mouth daily.     [name] POTASSIUM CHLORIDE PO Take 198 mg by mouth daily.    . traZODone (DESYREL) 100 MG tablet Take 1 tablet (100 mg total) by mouth at bedtime as needed for sleep. 30 tablet 4  . valACYclovir (VALTREX) 1000 MG tablet Take 1 tablet (1,000 mg total) by mouth daily as needed. Must estab w/new provider for future refills 60 tablet 2   No current facility-administered medications for this visit.    Medical Decision Making:  Established Problem, Stable/Improving (1), Review of Medication Regimen & Side Effects (2) and Review of New Medication or Change in Dosage (2)  Treatment Plan Summary:Medication management and Plan  Medication management and Plan Other Specified depressive disorder- continue Wellbutrin at 300 mg daily   Generalized anxiety disorder- patient continues to have anxiety about her pain , increase Cymbalta to 60 mg daily  Taper alprazolam to 0.5mg  to be taken twice daily as needed for one month and then decrease to 1 tablet daily as needed for one month and then stop completely  Insomnia-continue  trazodone 100 mg at bedtime. Patient reports good benefit.   Patient will follow up in 1 month or call before if needed.  Marland KitchenRAVI, Allanna Bresee 12/15/2015, 2:04 PM

## 2015-12-21 ENCOUNTER — Encounter: Payer: Self-pay | Admitting: Family Medicine

## 2015-12-21 ENCOUNTER — Ambulatory Visit (INDEPENDENT_AMBULATORY_CARE_PROVIDER_SITE_OTHER): Payer: BLUE CROSS/BLUE SHIELD | Admitting: Family Medicine

## 2015-12-21 VITALS — BP 108/62 | HR 95 | Temp 98.3°F | Wt 223.5 lb

## 2015-12-21 DIAGNOSIS — E119 Type 2 diabetes mellitus without complications: Secondary | ICD-10-CM

## 2015-12-21 DIAGNOSIS — E669 Obesity, unspecified: Secondary | ICD-10-CM

## 2015-12-21 DIAGNOSIS — E1169 Type 2 diabetes mellitus with other specified complication: Secondary | ICD-10-CM

## 2015-12-21 LAB — HEMOGLOBIN A1C: Hgb A1c MFr Bld: 7.1 % — ABNORMAL HIGH (ref 4.6–6.5)

## 2015-12-21 LAB — COMPREHENSIVE METABOLIC PANEL
ALT: 12 U/L (ref 0–35)
AST: 11 U/L (ref 0–37)
Albumin: 4 g/dL (ref 3.5–5.2)
Alkaline Phosphatase: 48 U/L (ref 39–117)
BUN: 15 mg/dL (ref 6–23)
CO2: 26 mEq/L (ref 19–32)
Calcium: 9.5 mg/dL (ref 8.4–10.5)
Chloride: 105 mEq/L (ref 96–112)
Creatinine, Ser: 0.68 mg/dL (ref 0.40–1.20)
GFR: 99.1 mL/min (ref >60.00)
Glucose, Bld: 172 mg/dL — ABNORMAL HIGH (ref 70–99)
Potassium: 4.8 mEq/L (ref 3.5–5.1)
Sodium: 137 mEq/L (ref 135–145)
Total Bilirubin: 0.3 mg/dL (ref 0.2–1.2)
Total Protein: 6.6 g/dL (ref 6.0–8.3)

## 2015-12-21 LAB — LIPID PANEL
Cholesterol: 133 mg/dL (ref 0–200)
HDL: 56.8 mg/dL (ref >39.00)
LDL Cholesterol: 55 mg/dL (ref 0–99)
NonHDL: 76.07
Total CHOL/HDL Ratio: 2
Triglycerides: 105 mg/dL (ref 0.0–149.0)
VLDL: 21 mg/dL (ref 0.0–40.0)

## 2015-12-21 MED ORDER — EXENATIDE ER 2 MG ~~LOC~~ PEN: PEN_INJECTOR | SUBCUTANEOUS | Status: DC

## 2015-12-21 NOTE — Progress Notes (Signed)
Pre visit review using our clinic review tool, if applicable. No additional management support is needed unless otherwise documented below in the visit note. 

## 2015-12-21 NOTE — Progress Notes (Signed)
Subjective:   Patient ID: Amanda Vasquez, female    DOB: 1971/01/13, 45 y.o.   MRN: [id number]  Amanda Vasquez is a pleasant 45 y.o. year old female who presents to clinic today with Follow-up  on 12/21/2015  HPI:  DM- Taking glipizide 5 mg daily, Metformin 1000 mg twice daily  along with Liraglutide 2mg  weekly. Neg urine micro 08/11/15 She is also taking a statin- lipitor 10 mg daily. Checks FSBS a few times a week- usually fasting is around 130s.  Felt FSBS have deteriorated since having to switch back to Victoza. DM was better controlled on Bydureon, forced to switch by insurance provider. Wt Readings from Last 3 Encounters:  12/21/15 223 lb 8 oz (101.379 kg)  12/15/15 225 lb 3.2 oz (102.15 kg)  10/09/15 226 lb (102.513 kg)    Lab Results  Component Value Date   HGBA1C 7.0* 08/11/2015   Lab Results  Component Value Date   CHOL 173 08/11/2015   HDL 61.50 08/11/2015   LDLCALC 91 08/11/2015   LDLDIRECT 138.8 06/22/2011   TRIG 100.0 08/11/2015   CHOLHDL 3 08/11/2015   Current Outpatient Prescriptions on File Prior to Visit  Medication Sig Dispense Refill  . ALPRAZolam (XANAX) 0.5 MG tablet Take 1 tablet (0.5 mg total) by mouth 4 (four) times daily as needed for anxiety. 120 tablet 4  . aspirin EC 325 MG tablet Take 1 tablet (325 mg total) by mouth 2 (two) times daily after a meal. 60 tablet 0  . atorvastatin (LIPITOR) 10 MG tablet Take 1 tablet (10 mg total) by mouth daily. (Patient taking differently: Take 10 mg by mouth every evening. ) 90 tablet 3  . b complex vitamins tablet Take 1 tablet by mouth daily.      . baclofen (LIORESAL) 10 MG tablet Take 1 tablet (10 mg total) by mouth 3 (three) times daily. 30 each   . buPROPion (WELLBUTRIN XL) 300 MG 24 hr tablet Take 1 tablet (300 mg total) by mouth daily. Take 300 mg by mouth daily. (Patient taking differently: Take 300 mg by mouth every morning. Take 300 mg by mouth daily.) 30 tablet 4  . cefadroxil (DURICEF) 500 MG capsule  Take 1 capsule (500 mg total) by mouth 2 (two) times daily. 2 capsule 0  . Cetirizine HCl 10 MG CAPS Take 1 capsule (10 mg total) by mouth daily as needed. Take by mouth. (Patient taking differently: Take 10 mg by mouth every evening. Take by mouth.) 30 capsule   . Cholecalciferol (VITAMIN D3) 2000 UNITS TABS Take 100-2,000 Units by mouth daily.     . DULoxetine (CYMBALTA) 60 MG capsule Take 1 capsule (60 mg total) by mouth daily. 30 capsule 2  . glipiZIDE (GLUCOTROL) 5 MG tablet Take 1 tablet (5 mg total) by mouth daily. (Patient taking differently: Take 5 mg by mouth every morning. ) 90 tablet 3  . GRALISE 600 MG TABS Take 1,800 mg by mouth every evening.     . Liraglutide 18 MG/3ML SOPN Inject 0.1-0.2 mLs (0.6-1.2 mg total) into the skin daily. Week 1 take 0.6 mg daily, week 2 and after take 1.2 mg daily. (Patient taking differently: Inject 2 mg into the skin once a week. BYDUREON--  Weekly on Saturday's) 6 mL 3  . metFORMIN (GLUCOPHAGE) 1000 MG tablet TAKE ONE TABLET BY MOUTH TWICE DAILY WITH A MEAL 180 tablet 3  . morphine (MS CONTIN) 60 MG 12 hr tablet Take 60 mg by mouth every 12 (  twelve) hours.    . multivitamin-iron-minerals-folic acid (CENTRUM) chewable tablet Chew 1 tablet by mouth daily.    [name] oxyCODONE (OXY IR/ROXICODONE) 5 MG immediate release tablet Take 1 tablet (5 mg total) by mouth every 4 (four) hours as needed for severe pain. Per pain clinic Dr Hardin Negus 30 tablet   . polyethylene glycol (MIRALAX / GLYCOLAX) packet Take 17 g by mouth daily.     [name] POTASSIUM CHLORIDE PO Take 198 mg by mouth daily.    . traZODone (DESYREL) 100 MG tablet Take 1 tablet (100 mg total) by mouth at bedtime as needed for sleep. 30 tablet 4  . valACYclovir (VALTREX) 1000 MG tablet Take 1 tablet (1,000 mg total) by mouth daily as needed. Must estab w/new provider for future refills 60 tablet 2   No current facility-administered medications on file prior to visit.    Allergies  Allergen Reactions  . Latex  Hives  . Prednisone Other (See Comments)    hallucination    Past Medical History  Diagnosis Date  . 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 Migraine   . Demyelinating disorder ([place])     brain lesion that touches thalamic  . Knee pain   . Dyslipidemia   . Allergic rhinitis   . Generalized anxiety disorder     mostly controlled  . History of colitis   . History of adenomatous polyp of colon   . Acute meniscal tear of left knee   . General weakness     left hand and leg  . Chronic constipation   . Wears glasses   . Diabetes mellitus, type II ([place])     controlled with medication;    Past Surgical History  Procedure Laterality Date  . Breast reduction surgery Bilateral 1997  . Cholecystectomy  1998  . Transthoracic echocardiogram  03-02-2015    normal LVF,  ef 65-70%  . Knee arthroscopy Right 1993  . Knee arthroscopy with medial menisectomy Left 10/09/2015    Procedure: LEFT KNEE ARTHROSCOPY WITH PARTIAL MEDIAL MENISCECTOMY; PATELLA-FEMORAL CHONDROPLASTY;  Surgeon: Rod Can, MD;  Location: [place];  Service: Orthopedics;  Laterality: Left;    Family History  Problem Relation Age of Onset  . Adopted: Yes    Social History   Social History  . Marital Status: Divorced    Spouse Name: N/A  . Number of Children: 0  . Years of Education: 12+   Occupational History  . RN- long-term disability     Social History Main Topics  . Smoking status: Former Smoker -- 0.25 packs/day for 20 years    Types: Cigarettes    Start date: 06/08/1986    Quit date: 08/22/2010  . Smokeless tobacco: Never Used  . Alcohol Use: No  . Drug Use: No  . Sexual Activity: Yes    Birth Control/ Protection: IUD     Comment: Mirena IUD placement Dec 2015    Other Topics Concern  . Not on file   Social History Narrative   Divorced. Lives at home with herself.   Caffeine use: 1 cup coffee 3 times per week   The PMH, PSH, Social History, Family History, Medications, and allergies have been reviewed in [facility], and have been updated if relevant.   Review of Systems  Constitutional:  Negative.   Gastrointestinal: Positive for nausea.  Musculoskeletal: Negative.   Skin: Negative.   Hematological: Negative.   Psychiatric/Behavioral: Negative.        Objective:    BP 108/62 mmHg  Pulse 95  Temp(Src) 98.3 F (36.8 C) (Oral)  Wt 223 lb 8 oz (101.379 kg)  SpO2 96%  LMP 12/15/2015   Physical Exam  Constitutional: She is oriented to person, place, and time. She appears well-developed and well-nourished. No distress.  HENT:  Head: Normocephalic.  Eyes: Conjunctivae are normal.  Cardiovascular: Normal rate.   Pulmonary/Chest: Effort normal.  Musculoskeletal: Normal range of motion.  Neurological: She is alert and oriented to person, place, and time. No cranial nerve deficit.  Skin: Skin is warm and dry. She is not diaphoretic.  Psychiatric: She has a normal mood and affect. Her behavior is normal. Judgment and thought content normal.  Nursing note and vitals reviewed.         Assessment & Plan:   Diabetes mellitus type 2 in obese ([name]) No Follow-up on file.

## 2015-12-21 NOTE — Assessment & Plan Note (Signed)
Deteriorated. Much better control with Bydureon.  In January 2017, [employer] no longer would cover this and had to switch to victoza.  Having nausea, weight gain, and poor control of blood sugars with Bydureon.  Will check labs today and request prior authorization for Bydureon.

## 2015-12-22 ENCOUNTER — Encounter: Payer: Self-pay | Admitting: Family Medicine

## 2015-12-28 ENCOUNTER — Telehealth: Payer: Self-pay | Admitting: *Deleted

## 2015-12-28 NOTE — Telephone Encounter (Signed)
Form received indicating PA required for pts bydureon. Form completed and faxed back to requested party; awaiting response

## 2015-12-28 NOTE — Telephone Encounter (Signed)
Amanda Vasquez with [employer] [place] left v/m that Bydureon was denied and pt had been notified. FYI to [place].

## 2015-12-28 NOTE — Telephone Encounter (Signed)
Rena received vm indicating PA denied, but a fax was received, stating approved. Approval letter faxed to pharmacy and sent to be scanned into pt chart

## 2015-12-28 NOTE — Telephone Encounter (Signed)
Pt will need new Rx

## 2016-01-12 ENCOUNTER — Other Ambulatory Visit: Payer: Self-pay | Admitting: Family Medicine

## 2016-01-13 ENCOUNTER — Other Ambulatory Visit: Payer: Self-pay | Admitting: *Deleted

## 2016-01-13 MED ORDER — DULOXETINE HCL 60 MG PO CPEP: 60.0000 mg | ORAL_CAPSULE | Freq: Every day | ORAL | Status: DC

## 2016-01-13 MED ORDER — ALPRAZOLAM 0.5 MG PO TABS: 0.5000 mg | ORAL_TABLET | Freq: Four times a day (QID) | ORAL | Status: DC | PRN

## 2016-01-13 MED ORDER — TRAZODONE HCL 100 MG PO TABS: 100.0000 mg | ORAL_TABLET | Freq: Every evening | ORAL | Status: DC | PRN

## 2016-01-13 NOTE — Telephone Encounter (Signed)
Patient called and needs Trazodone 100, Cymbalta 60, Alprazolam 0.5 refilled.

## 2016-01-13 NOTE — Telephone Encounter (Signed)
Patient called stating that at last appt Dr. Deborra Medina agreed to take over prescribing psych meds for patient. She needs refills now. Did not advise which meds. Message left to return my call and advise.

## 2016-01-13 NOTE — Telephone Encounter (Signed)
Rx called in as directed.   

## 2016-01-14 ENCOUNTER — Ambulatory Visit: Payer: BLUE CROSS/BLUE SHIELD | Admitting: Psychiatry

## 2016-02-01 ENCOUNTER — Telehealth: Payer: Self-pay

## 2016-02-01 NOTE — Telephone Encounter (Signed)
Dr Ravi pt 

## 2016-03-02 ENCOUNTER — Other Ambulatory Visit: Payer: Self-pay | Admitting: Family Medicine

## 2016-03-03 ENCOUNTER — Encounter: Payer: Self-pay | Admitting: Family Medicine

## 2016-03-10 ENCOUNTER — Encounter: Payer: Self-pay | Admitting: Family Medicine

## 2016-03-10 ENCOUNTER — Ambulatory Visit (INDEPENDENT_AMBULATORY_CARE_PROVIDER_SITE_OTHER): Payer: BLUE CROSS/BLUE SHIELD | Admitting: Family Medicine

## 2016-03-10 VITALS — BP 116/74 | HR 101 | Temp 98.1°F | Wt 218.5 lb

## 2016-03-10 DIAGNOSIS — E119 Type 2 diabetes mellitus without complications: Secondary | ICD-10-CM

## 2016-03-10 DIAGNOSIS — E1169 Type 2 diabetes mellitus with other specified complication: Secondary | ICD-10-CM

## 2016-03-10 DIAGNOSIS — E669 Obesity, unspecified: Secondary | ICD-10-CM

## 2016-03-10 DIAGNOSIS — R5383 Other fatigue: Secondary | ICD-10-CM | POA: Diagnosis not present

## 2016-03-10 LAB — COMPREHENSIVE METABOLIC PANEL
ALT: 12 U/L (ref 0–35)
AST: 15 U/L (ref 0–37)
Albumin: 4.4 g/dL (ref 3.5–5.2)
Alkaline Phosphatase: 64 U/L (ref 39–117)
BUN: 15 mg/dL (ref 6–23)
CO2: 27 mEq/L (ref 19–32)
Calcium: 9.7 mg/dL (ref 8.4–10.5)
Chloride: 99 mEq/L (ref 96–112)
Creatinine, Ser: 0.91 mg/dL (ref 0.40–1.20)
GFR: 70.73 mL/min (ref >60.00)
Glucose, Bld: 91 mg/dL (ref 70–99)
Potassium: 3.7 mEq/L (ref 3.5–5.1)
Sodium: 135 mEq/L (ref 135–145)
Total Bilirubin: 0.2 mg/dL (ref 0.2–1.2)
Total Protein: 7.4 g/dL (ref 6.0–8.3)

## 2016-03-10 LAB — CBC WITH DIFFERENTIAL/PLATELET
Basophils Absolute: 0 10*3/uL (ref 0.0–0.1)
Basophils Relative: 0.3 % (ref 0.0–3.0)
Eosinophils Absolute: 0.3 10*3/uL (ref 0.0–0.7)
Eosinophils Relative: 3.1 % (ref 0.0–5.0)
HCT: 40.7 % (ref 36.0–46.0)
Hemoglobin: 13.3 g/dL (ref 12.0–15.0)
Lymphocytes Relative: 49 % — ABNORMAL HIGH (ref 12.0–46.0)
Lymphs Abs: 4.1 10*3/uL — ABNORMAL HIGH (ref 0.7–4.0)
MCHC: 32.6 g/dL (ref 30.0–36.0)
MCV: 86.6 fl (ref 78.0–100.0)
Monocytes Absolute: 0.5 10*3/uL (ref 0.1–1.0)
Monocytes Relative: 6.4 % (ref 3.0–12.0)
Neutro Abs: 3.5 10*3/uL (ref 1.4–7.7)
Neutrophils Relative %: 41.2 % — ABNORMAL LOW (ref 43.0–77.0)
Platelets: 271 10*3/uL (ref 150.0–400.0)
RBC: 4.7 Mil/uL (ref 3.87–5.11)
RDW: 13.5 % (ref 11.5–15.5)
WBC: 8.4 10*3/uL (ref 4.0–10.5)

## 2016-03-10 LAB — HEMOGLOBIN A1C: Hgb A1c MFr Bld: 6.9 % — ABNORMAL HIGH (ref 4.6–6.5)

## 2016-03-10 LAB — VITAMIN B12: Vitamin B-12: 541 pg/mL (ref 211–911)

## 2016-03-10 LAB — TSH: TSH: 2.88 u[IU]/mL (ref 0.35–4.50)

## 2016-03-10 LAB — VITAMIN D 25 HYDROXY (VIT D DEFICIENCY, FRACTURES): VITD: 46.22 ng/mL (ref 30.00–100.00)

## 2016-03-10 NOTE — Progress Notes (Signed)
Pre visit review using our clinic review tool, if applicable. No additional management support is needed unless otherwise documented below in the visit note. 

## 2016-03-10 NOTE — Addendum Note (Signed)
Addended by: Ellamae Sia on: 03/10/2016 11:44 AM   Modules accepted: Orders

## 2016-03-10 NOTE — Assessment & Plan Note (Signed)
New- persistent. Exam unremarkable. Will check labs today, including D dimer given her recent travel. The patient indicates understanding of these issues and agrees with the plan. Orders Placed This Encounter  Procedures  . Hemoglobin A1c  . Comprehensive metabolic panel  . Vitamin B12  . Vitamin D, 25-hydroxy  . TSH  . CBC with Differential/Platelet  . B. Burgdorfi Antibodies  . D-dimer, quantitative (not at [facility])

## 2016-03-10 NOTE — Progress Notes (Signed)
Subjective:   Patient ID: Amanda Vasquez, female    DOB: 11-Jun-1971, 45 y.o.   MRN: [id number]  Amanda Vasquez is a pleasant 45 y.o. year old female who presents to clinic today with Fatigue  on 03/10/2016  HPI:  Martin Majestic on a trip to [place] two weeks ago.  While she was there, she had her first migraine- associated with flashing lights, photo/phonophobia, nausea and vomiting.  That resolved within a day or two.  Has had no further headaches but since her return home she has been "profoundly tired."  She feels she is sleeping well.  No CP or SOB but just "does not feel right."  No swelling her legs.  Denies tick bites.  Appetite has decreased since the fatigue started.  Wt Readings from Last 3 Encounters:  03/10/16 218 lb 8 oz (99.111 kg)  12/21/15 223 lb 8 oz (101.379 kg)  12/15/15 225 lb 3.2 oz (102.15 kg)   Denies feeling depressed or anxious.  Current Outpatient Prescriptions on File Prior to Visit  Medication Sig Dispense Refill  . ALPRAZolam (XANAX) 0.5 MG tablet Take 1 tablet (0.5 mg total) by mouth 4 (four) times daily as needed for anxiety. 120 tablet 4  . aspirin EC 325 MG tablet Take 1 tablet (325 mg total) by mouth 2 (two) times daily after a meal. 60 tablet 0  . atorvastatin (LIPITOR) 10 MG tablet Take 1 tablet (10 mg total) by mouth daily. (Patient taking differently: Take 10 mg by mouth every evening. ) 90 tablet 3  . b complex vitamins tablet Take 1 tablet by mouth daily.      . baclofen (LIORESAL) 10 MG tablet Take 1 tablet (10 mg total) by mouth 3 (three) times daily. 30 each   . buPROPion (WELLBUTRIN XL) 300 MG 24 hr tablet Take 1 tablet (300 mg total) by mouth daily. Take 300 mg by mouth daily. (Patient taking differently: Take 300 mg by mouth every morning. Take 300 mg by mouth daily.) 30 tablet 4  . Cetirizine HCl 10 MG CAPS Take 1 capsule (10 mg total) by mouth daily as needed. Take by mouth. (Patient taking differently: Take 10 mg by mouth every evening. Take  by mouth.) 30 capsule   . Cholecalciferol (VITAMIN D3) 2000 UNITS TABS Take 100-2,000 Units by mouth daily.     . DULoxetine (CYMBALTA) 60 MG capsule Take 1 capsule (60 mg total) by mouth daily. 30 capsule 2  . Exenatide ER (BYDUREON) 2 MG PEN Inject SQ weekly 1 each 3  . glipiZIDE (GLUCOTROL) 5 MG tablet Take 1 tablet (5 mg total) by mouth daily. (Patient taking differently: Take 5 mg by mouth every morning. ) 90 tablet 3  . GRALISE 600 MG TABS Take 1,800 mg by mouth every evening.     . metFORMIN (GLUCOPHAGE) 1000 MG tablet TAKE ONE TABLET BY MOUTH TWICE DAILY WITH A MEAL 180 tablet 3  . morphine (MS CONTIN) 60 MG 12 hr tablet Take 60 mg by mouth every 12 (twelve) hours.    . multivitamin-iron-minerals-folic acid (CENTRUM) chewable tablet Chew 1 tablet by mouth daily.    [name] oxyCODONE (OXY IR/ROXICODONE) 5 MG immediate release tablet Take 1 tablet (5 mg total) by mouth every 4 (four) hours as needed for severe pain. Per pain clinic Dr Hardin Negus 30 tablet   . polyethylene glycol (MIRALAX / GLYCOLAX) packet Take 17 g by mouth daily.     [name] POTASSIUM CHLORIDE PO Take 198 mg by mouth daily.    [name]  traZODone (DESYREL) 100 MG tablet Take 1 tablet (100 mg total) by mouth at bedtime as needed for sleep. 30 tablet 4  . valACYclovir (VALTREX) 1000 MG tablet Take 1 tablet (1,000 mg total) by mouth daily as needed. Must estab w/new provider for future refills 60 tablet 2   No current facility-administered medications on file prior to visit.    Allergies  Allergen Reactions  . Latex Hives  . Prednisone Other (See Comments)    hallucination    Past Medical History  Diagnosis Date  . 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 Migraine   . Demyelinating disorder ([place])     brain lesion that  touches thalamic  . Knee pain   . Dyslipidemia   . Allergic rhinitis   . Generalized anxiety disorder     mostly controlled  . History of colitis   . History of adenomatous polyp of colon   . Acute meniscal tear of left knee   . General weakness     left hand and leg  . Chronic constipation   . Wears glasses   . Diabetes mellitus, type II ([place])     controlled with medication;    Past Surgical History  Procedure Laterality Date  . Breast reduction surgery Bilateral 1997  . Cholecystectomy  1998  . Transthoracic echocardiogram  03-02-2015    normal LVF,  ef 65-70%  . Knee arthroscopy Right 1993  . Knee arthroscopy with medial menisectomy Left 10/09/2015    Procedure: LEFT KNEE ARTHROSCOPY WITH PARTIAL MEDIAL MENISCECTOMY; PATELLA-FEMORAL CHONDROPLASTY;  Surgeon: Rod Can, MD;  Location: [place];  Service: Orthopedics;  Laterality: Left;    Family History  Problem Relation Age of Onset  . Adopted: Yes    Social History   Social History  . Marital Status: Divorced    Spouse Name: N/A  . Number of Children: 0  . Years of Education: 12+   Occupational History  . RN- long-term disability     Social History Main Topics  . Smoking status: Former Smoker -- 0.25 packs/day for 20 years    Types: Cigarettes    Start date: 06/08/1986    Quit date: 08/22/2010  . Smokeless tobacco: Never Used  . Alcohol Use: No  . Drug Use: No  . Sexual Activity: Yes    Birth Control/ Protection: IUD     Comment: Mirena IUD placement Dec 2015   Other Topics Concern  . Not on file   Social History Narrative   Divorced. Lives at home with herself.   Caffeine use: 1 cup coffee 3 times per week   The PMH, PSH, Social History, Family History, Medications, and allergies have been reviewed in [facility], and have been updated if relevant.   Review of Systems  Constitutional: Positive for appetite change, fatigue and unexpected weight change. Negative for fever.  HENT:  Negative.   Respiratory: Negative.   Cardiovascular: Negative.   Gastrointestinal: Negative.   Endocrine: Negative.   Musculoskeletal: Negative.   Skin: Negative.   Allergic/Immunologic: Negative.   Neurological: Negative.   Hematological: Negative.   Psychiatric/Behavioral: Negative.   All other systems reviewed and are negative.      Objective:    BP 116/74 mmHg  Pulse 101  Temp(Src) 98.1 F (36.7 C) (Oral)  Wt 218 lb 8 oz (99.111 kg)  SpO2 97%  LMP 02/15/2016   Physical Exam  Constitutional: She is oriented to person, place, and time. She appears well-developed and well-nourished.  HENT:  Head: Atraumatic.  Eyes: Conjunctivae are normal.  Neck: Normal range of motion. Neck supple. No thyromegaly present.  Cardiovascular: Normal rate, regular rhythm and normal heart sounds.   Pulmonary/Chest: Effort normal and breath sounds normal. No respiratory distress.  Abdominal: Soft.  Musculoskeletal: Normal range of motion. She exhibits no edema.  Neurological: She is alert and oriented to person, place, and time. No cranial nerve deficit.  Skin: Skin is warm and dry. She is not diaphoretic.  Psychiatric: She has a normal mood and affect. Her behavior is normal. Judgment and thought content normal.  Nursing note and vitals reviewed.         Assessment & Plan:   Other fatigue - Plan: Comprehensive metabolic panel, Vitamin 123456, Vitamin D, 25-hydroxy, TSH, CBC with Differential/Platelet, B. Burgdorfi Antibodies, D-dimer, quantitative (not at [facility])  Diabetes mellitus type 2 in obese ([place]) - Plan: Hemoglobin A1c No Follow-up on file.

## 2016-03-11 ENCOUNTER — Encounter: Payer: Self-pay | Admitting: Family Medicine

## 2016-03-11 ENCOUNTER — Telehealth: Payer: Self-pay | Admitting: Family Medicine

## 2016-03-11 LAB — D-DIMER, QUANTITATIVE (NOT AT ARMC): D-Dimer, Quant: 0.34 mcg/mL FEU (ref <0.50)

## 2016-03-11 LAB — LYME AB/WESTERN BLOT REFLEX: B burgdorferi Ab IgG+IgM: <0.9 Index (ref <0.90)

## 2016-03-11 NOTE — Telephone Encounter (Signed)
Pt called wanting to get her lab results from yesterday Please advise pt status of labs

## 2016-03-11 NOTE — Telephone Encounter (Signed)
See result note.  

## 2016-03-21 ENCOUNTER — Other Ambulatory Visit: Payer: Self-pay | Admitting: Family Medicine

## 2016-04-20 ENCOUNTER — Encounter: Payer: Self-pay | Admitting: Family Medicine

## 2016-04-21 MED ORDER — EXENATIDE ER 2 MG ~~LOC~~ PEN: PEN_INJECTOR | SUBCUTANEOUS | 3 refills | Status: DC

## 2016-05-05 ENCOUNTER — Encounter: Payer: Self-pay | Admitting: Family Medicine

## 2016-05-12 MED ORDER — METFORMIN HCL 1000 MG PO TABS: 1000.0000 mg | ORAL_TABLET | Freq: Two times a day (BID) | ORAL | 3 refills | Status: DC

## 2016-05-12 MED ORDER — TRAZODONE HCL 100 MG PO TABS: 100.0000 mg | ORAL_TABLET | Freq: Every evening | ORAL | 4 refills | Status: DC | PRN

## 2016-05-12 MED ORDER — ATORVASTATIN CALCIUM 10 MG PO TABS: 10.0000 mg | ORAL_TABLET | Freq: Every evening | ORAL | 1 refills | Status: DC

## 2016-05-12 MED ORDER — EXENATIDE ER 2 MG ~~LOC~~ PEN: PEN_INJECTOR | SUBCUTANEOUS | 3 refills | Status: DC

## 2016-05-12 MED ORDER — DULOXETINE HCL 60 MG PO CPEP: 60.0000 mg | ORAL_CAPSULE | Freq: Every day | ORAL | 2 refills | Status: DC

## 2016-05-12 MED ORDER — VALACYCLOVIR HCL 1 G PO TABS: 1000.0000 mg | ORAL_TABLET | Freq: Every day | ORAL | 2 refills | Status: DC | PRN

## 2016-05-12 MED ORDER — GLIPIZIDE 5 MG PO TABS: 5.0000 mg | ORAL_TABLET | Freq: Every morning | ORAL | 0 refills | Status: DC

## 2016-05-12 NOTE — Telephone Encounter (Signed)
Hold xanax until contract complete.

## 2016-05-12 NOTE — Telephone Encounter (Signed)
Pt is requesting medication refills transferred to local pharmacy. One medication pt is requesting is a controlled substance and we are not able to transfer until next month, once she has updated her contract with the new pharmacy, per contract. pls advise

## 2016-05-12 NOTE — Telephone Encounter (Signed)
Ok to refill appropriate rxs as requested.

## 2016-05-12 NOTE — Telephone Encounter (Signed)
Is it ok to transfer her xanax too, even with her contract?

## 2016-05-16 ENCOUNTER — Encounter: Payer: Self-pay | Admitting: Family Medicine

## 2016-05-17 ENCOUNTER — Other Ambulatory Visit: Payer: Self-pay | Admitting: Family Medicine

## 2016-05-17 ENCOUNTER — Ambulatory Visit (INDEPENDENT_AMBULATORY_CARE_PROVIDER_SITE_OTHER): Payer: BLUE CROSS/BLUE SHIELD | Admitting: Family Medicine

## 2016-05-17 ENCOUNTER — Encounter: Payer: Self-pay | Admitting: Family Medicine

## 2016-05-17 VITALS — BP 128/58 | HR 89 | Temp 98.2°F | Wt 224.5 lb

## 2016-05-17 DIAGNOSIS — H65193 Other acute nonsuppurative otitis media, bilateral: Secondary | ICD-10-CM | POA: Diagnosis not present

## 2016-05-17 MED ORDER — BUPROPION HCL ER (XL) 300 MG PO TB24: ORAL_TABLET | ORAL | 4 refills | Status: DC

## 2016-05-17 MED ORDER — ALPRAZOLAM 0.5 MG PO TABS: 0.5000 mg | ORAL_TABLET | Freq: Four times a day (QID) | ORAL | 4 refills | Status: DC | PRN

## 2016-05-17 MED ORDER — TRAZODONE HCL 100 MG PO TABS: 100.0000 mg | ORAL_TABLET | Freq: Every evening | ORAL | 4 refills | Status: DC | PRN

## 2016-05-17 MED ORDER — AMOXICILLIN-POT CLAVULANATE 875-125 MG PO TABS: 1.0000 | ORAL_TABLET | Freq: Two times a day (BID) | ORAL | 0 refills | Status: AC

## 2016-05-17 NOTE — Progress Notes (Signed)
SUBJECTIVE: Amanda Vasquez is a 45 y.o. female  with 7 day(s) history of pain  both ears, and coryza, congestion and sore throat. Temperature not measured at home.   Current Outpatient Prescriptions on File Prior to Visit  Medication Sig Dispense Refill  . aspirin EC 325 MG tablet Take 1 tablet (325 mg total) by mouth 2 (two) times daily after a meal. 60 tablet 0  . atorvastatin (LIPITOR) 10 MG tablet Take 1 tablet (10 mg total) by mouth every evening. 90 tablet 1  . b complex vitamins tablet Take 1 tablet by mouth daily.      . baclofen (LIORESAL) 10 MG tablet Take 1 tablet (10 mg total) by mouth 3 (three) times daily. 30 each   . Cetirizine HCl 10 MG CAPS Take 1 capsule (10 mg total) by mouth daily as needed. Take by mouth. (Patient taking differently: Take 10 mg by mouth every evening. Take by mouth.) 30 capsule   . Cholecalciferol (VITAMIN D3) 2000 UNITS TABS Take 100-2,000 Units by mouth daily.     [name] dronabinol (MARINOL) 2.5 MG capsule Take 2.5 mg by mouth daily as needed.  1  . DULoxetine (CYMBALTA) 60 MG capsule Take 1 capsule (60 mg total) by mouth daily. 30 capsule 2  . Exenatide ER (BYDUREON) 2 MG PEN Inject SQ weekly 1 each 3  . glipiZIDE (GLUCOTROL) 5 MG tablet Take 1 tablet (5 mg total) by mouth every morning. 30 tablet 0  . GRALISE 600 MG TABS Take 1,800 mg by mouth every evening.     . metFORMIN (GLUCOPHAGE) 1000 MG tablet Take 1 tablet (1,000 mg total) by mouth 2 (two) times daily with a meal. 180 tablet 3  . morphine (MS CONTIN) 60 MG 12 hr tablet Take 60 mg by mouth every 12 (twelve) hours.    . multivitamin-iron-minerals-folic acid (CENTRUM) chewable tablet Chew 1 tablet by mouth daily.    [name] oxyCODONE (OXY IR/ROXICODONE) 5 MG immediate release tablet Take 1 tablet (5 mg total) by mouth every 4 (four) hours as needed for severe pain. Per pain clinic Dr Hardin Negus 30 tablet   . polyethylene glycol (MIRALAX / GLYCOLAX) packet Take 17 g by mouth daily.     [name] POTASSIUM CHLORIDE PO  Take 198 mg by mouth daily.    . valACYclovir (VALTREX) 1000 MG tablet Take 1 tablet (1,000 mg total) by mouth daily as needed. Must estab w/new provider for future refills 60 tablet 2   No current facility-administered medications on file prior to visit.     Allergies  Allergen Reactions  . Latex Hives  . Prednisone Other (See Comments)    hallucination    Past Medical History:  Diagnosis Date  . Acute meniscal tear of left knee   . Allergic rhinitis   . Chronic constipation   . Demyelinating disorder ([place])    brain lesion that touches thalamic  . Diabetes mellitus, type II ([place])    controlled with medication;  . Dyslipidemia   . General weakness    left hand and leg  . Generalized anxiety disorder    mostly controlled  . History of adenomatous polyp of colon   . History of colitis   . Knee pain   . Migraine   . 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  Wears glasses     Past Surgical History:  Procedure Laterality Date  . BREAST REDUCTION SURGERY Bilateral 1997  . CHOLECYSTECTOMY  1998  . KNEE ARTHROSCOPY Right 1993  . KNEE ARTHROSCOPY WITH MEDIAL MENISECTOMY Left 10/09/2015   Procedure: LEFT KNEE ARTHROSCOPY WITH PARTIAL MEDIAL MENISCECTOMY; PATELLA-FEMORAL CHONDROPLASTY;  Surgeon: Rod Can, MD;  Location: [facility] [place];  Service: Orthopedics;  Laterality: Left;  . TRANSTHORACIC ECHOCARDIOGRAM  03-02-2015   normal LVF,  ef 65-70%    Family History  Problem Relation Age of Onset  . Adopted: Yes    Social History   Social History  . Marital status: Divorced    Spouse name: N/A  . Number of children: 0  . Years of education: 12+   Occupational History  . RN- long-term disability     Social History Main Topics  . Smoking  status: Former Smoker    Packs/day: 0.25    Years: 20.00    Types: Cigarettes    Start date: 06/08/1986    Quit date: 08/22/2010  . Smokeless tobacco: Never Used  . Alcohol use No  . Drug use: No  . Sexual activity: Yes    Birth control/ protection: IUD     Comment: Mirena IUD placement Dec 2015   Other Topics Concern  . Not on file   Social History Narrative   Divorced. Lives at home with herself.   Caffeine use: 1 cup coffee 3 times per week   The PMH, PSH, Social History, Family History, Medications, and allergies have been reviewed in [facility], and have been updated if relevant.  OBJECTIVE: BP (!) 128/58   Pulse 89   Temp 98.2 F (36.8 C) (Oral)   Wt 224 lb 8 oz (101.8 kg)   LMP 04/17/2016   SpO2 95%   BMI 37.36 kg/m  General appearance: alert, well appearing, and in no distress.   Ears: right TM red, dull, bulging, left TM red, dull, bulging Nose: normal and patent, no erythema, discharge or polyps Oropharynx: mucous membranes moist, pharynx normal without lesions Neck: supple, no significant adenopathy Lungs: clear to auscultation, no wheezes, rales or rhonchi, symmetric air entry  ASSESSMENT: Otitis Media  PLAN: 1) See orders for this visit as documented in the electronic medical record. 2) Symptomatic therapy suggested: use acetaminophen, ibuprofen prn.  3) Call or return to clinic prn if these symptoms worsen or fail to improve as anticipated.

## 2016-05-17 NOTE — Progress Notes (Signed)
Pre visit review using our clinic review tool, if applicable. No additional management support is needed unless otherwise documented below in the visit note. 

## 2016-05-26 ENCOUNTER — Telehealth: Payer: Self-pay

## 2016-05-26 MED ORDER — AZITHROMYCIN 250 MG PO TABS: ORAL_TABLET | ORAL | 0 refills | Status: DC

## 2016-05-26 NOTE — Telephone Encounter (Signed)
Pt left v/m; pt was seen 05/17/16; pt is almost finished with abx and sinus still draining and backing up into eustachian tube, still has ear infection. Lymph nodes on both sides of neck are still tender and swollen.Pt feels warm but has not taken temp. Pt thinks augmentin may not work for pt any longer. Pt request different abx. Please advise. [facility]

## 2016-05-26 NOTE — Telephone Encounter (Signed)
I'm sorry to hear she is not feeling better.  eRx sent for zpack.  Please keep me updated.

## 2016-05-26 NOTE — Telephone Encounter (Signed)
Spoke to pt and informed her Rx has been sent to the pharmacy. States she will update [place] with s/s if no improvement

## 2016-06-22 ENCOUNTER — Ambulatory Visit (INDEPENDENT_AMBULATORY_CARE_PROVIDER_SITE_OTHER): Payer: BLUE CROSS/BLUE SHIELD | Admitting: Family Medicine

## 2016-06-22 ENCOUNTER — Encounter: Payer: Self-pay | Admitting: Family Medicine

## 2016-06-22 VITALS — BP 124/62 | HR 93 | Temp 98.1°F | Wt 239.0 lb

## 2016-06-22 DIAGNOSIS — K3184 Gastroparesis: Secondary | ICD-10-CM

## 2016-06-22 DIAGNOSIS — E1169 Type 2 diabetes mellitus with other specified complication: Secondary | ICD-10-CM | POA: Diagnosis not present

## 2016-06-22 DIAGNOSIS — E669 Obesity, unspecified: Secondary | ICD-10-CM | POA: Diagnosis not present

## 2016-06-22 LAB — HM DIABETES EYE EXAM

## 2016-06-22 LAB — HEMOGLOBIN A1C: Hgb A1c MFr Bld: 7.3 % — ABNORMAL HIGH (ref 4.6–6.5)

## 2016-06-22 MED ORDER — METOCLOPRAMIDE HCL 5 MG PO TABS: 5.0000 mg | ORAL_TABLET | Freq: Three times a day (TID) | ORAL | 3 refills | Status: DC

## 2016-06-22 NOTE — Patient Instructions (Signed)
We are starting reglan for your gastroparesis. Please keep me updated.

## 2016-06-22 NOTE — Progress Notes (Signed)
Pre visit review using our clinic review tool, if applicable. No additional management support is needed unless otherwise documented below in the visit note. 

## 2016-06-22 NOTE — Progress Notes (Signed)
Subjective:   Patient ID: Amanda Vasquez, female    DOB: November 04, 1970, 45 y.o.   MRN: [id number]  Amanda Vasquez is a pleasant 45 y.o. year old female who presents to clinic today with Follow-up  on 06/22/2016  HPI:  DM-currently taking Metformin 1000 mg twice daily and Glucotrol 5 mg daily, along with Bydureon 2 mg weekly.  Checks FSBS twice weekly- ranging 90-150.  Denies any episodes of hypoglycemia recently.  Has gained 15 pounds in the last couple of months.  Hurt her knee, binge eating at night.  Constantly nauseated and gasy.  BMs are more loose although they are typically quite firm due to her chronic opioid use.  Constantly belching for months.    Wt Readings from Last 3 Encounters:  06/22/16 239 lb (108.4 kg)  05/17/16 224 lb 8 oz (101.8 kg)  03/10/16 218 lb 8 oz (99.1 kg)    Lab Results  Component Value Date   HGBA1C 6.9 (H) 03/10/2016   Lab Results  Component Value Date   CHOL 133 12/21/2015   HDL 56.80 12/21/2015   LDLCALC 55 12/21/2015   LDLDIRECT 138.8 06/22/2011   TRIG 105.0 12/21/2015   CHOLHDL 2 12/21/2015   Lab Results  Component Value Date   NA 135 03/10/2016   K 3.7 03/10/2016   CL 99 03/10/2016   CO2 27 03/10/2016   Lab Results  Component Value Date   ALT 12 03/10/2016   AST 15 03/10/2016   ALKPHOS 64 03/10/2016   BILITOT 0.2 03/10/2016     Current Outpatient Prescriptions on File Prior to Visit  Medication Sig Dispense Refill  . ALPRAZolam (XANAX) 0.5 MG tablet Take 1 tablet (0.5 mg total) by mouth 4 (four) times daily as needed for anxiety. 120 tablet 4  . aspirin EC 325 MG tablet Take 1 tablet (325 mg total) by mouth 2 (two) times daily after a meal. 60 tablet 0  . atorvastatin (LIPITOR) 10 MG tablet Take 1 tablet (10 mg total) by mouth every evening. 90 tablet 1  . azithromycin (ZITHROMAX) 250 MG tablet 2 tabs by mouth on day 1 followed by 1 tab by mouth daily days 2-5 6 tablet 0  . b complex vitamins tablet Take 1 tablet by mouth daily.       . baclofen (LIORESAL) 10 MG tablet Take 1 tablet (10 mg total) by mouth 3 (three) times daily. 30 each   . buPROPion (WELLBUTRIN XL) 300 MG 24 hr tablet Take 300 mg by mouth daily. 30 tablet 4  . Cetirizine HCl 10 MG CAPS Take 1 capsule (10 mg total) by mouth daily as needed. Take by mouth. (Patient taking differently: Take 10 mg by mouth every evening. Take by mouth.) 30 capsule   . Cholecalciferol (VITAMIN D3) 2000 UNITS TABS Take 100-2,000 Units by mouth daily.     [name] dronabinol (MARINOL) 2.5 MG capsule Take 2.5 mg by mouth daily as needed.  1  . DULoxetine (CYMBALTA) 60 MG capsule Take 1 capsule (60 mg total) by mouth daily. 30 capsule 2  . Exenatide ER (BYDUREON) 2 MG PEN Inject SQ weekly 1 each 3  . glipiZIDE (GLUCOTROL) 5 MG tablet TAKE 1 TABLET BY MOUTH EVERY MORNING 30 tablet 1  . GRALISE 600 MG TABS Take 1,800 mg by mouth every evening.     . metFORMIN (GLUCOPHAGE) 1000 MG tablet Take 1 tablet (1,000 mg total) by mouth 2 (two) times daily with a meal. 180 tablet 3  .  morphine (MS CONTIN) 60 MG 12 hr tablet Take 60 mg by mouth every 12 (twelve) hours.    . multivitamin-iron-minerals-folic acid (CENTRUM) chewable tablet Chew 1 tablet by mouth daily.    [name] oxyCODONE (OXY IR/ROXICODONE) 5 MG immediate release tablet Take 1 tablet (5 mg total) by mouth every 4 (four) hours as needed for severe pain. Per pain clinic Dr Hardin Negus 30 tablet   . polyethylene glycol (MIRALAX / GLYCOLAX) packet Take 17 g by mouth daily.     [name] POTASSIUM CHLORIDE PO Take 198 mg by mouth daily.    . traZODone (DESYREL) 100 MG tablet Take 1 tablet (100 mg total) by mouth at bedtime as needed for sleep. 30 tablet 4  . valACYclovir (VALTREX) 1000 MG tablet Take 1 tablet (1,000 mg total) by mouth daily as needed. Must estab w/new provider for future refills 60 tablet 2   No current facility-administered medications on file prior to visit.     Allergies  Allergen Reactions  . Latex Hives    Past Medical History:    Diagnosis Date  . Acute meniscal tear of left knee   . Allergic rhinitis   . Chronic constipation   . Demyelinating disorder ([place])    brain lesion that touches thalamic  . Diabetes mellitus, type II ([place])    controlled with medication;  . Dyslipidemia   . General weakness    left hand and leg  . Generalized anxiety disorder    mostly controlled  . History of adenomatous polyp of colon   . History of colitis   . Knee pain   . Migraine   . 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 Wears glasses     Past Surgical History:  Procedure Laterality Date  . BREAST REDUCTION SURGERY Bilateral 1997  . CHOLECYSTECTOMY  1998  . KNEE ARTHROSCOPY Right 1993  . KNEE ARTHROSCOPY WITH MEDIAL MENISECTOMY Left 10/09/2015   Procedure: LEFT KNEE ARTHROSCOPY WITH PARTIAL MEDIAL MENISCECTOMY; PATELLA-FEMORAL CHONDROPLASTY;  Surgeon: Rod Can, MD;  Location: [facility];  Service: Orthopedics;  Laterality: Left;  . TRANSTHORACIC ECHOCARDIOGRAM  03-02-2015   normal LVF,  ef 65-70%    Family History  Problem Relation Age of Onset  . Adopted: Yes    Social History   Social History  . Marital status: Divorced    Spouse name: N/A  . Number of children: 0  . Years of education: 12+   Occupational History  . RN- long-term disability     Social History Main Topics  . Smoking status: Former Smoker    Packs/day: 0.25    Years: 20.00    Types: Cigarettes    Start date: 06/08/1986    Quit date: 08/22/2010  . Smokeless tobacco: Never Used  . Alcohol use No  . Drug use: No  . Sexual activity: Yes    Birth control/ protection: IUD     Comment: Mirena IUD placement Dec 2015   Other Topics Concern  . Not on file   Social History Narrative   Divorced. Lives at  home with herself.   Caffeine use: 1 cup coffee 3 times per week   The PMH, PSH, Social History, Family History, Medications, and allergies have been reviewed in [facility], and have been updated if relevant.   Review  of Systems  Constitutional: Positive for unexpected weight change.  Gastrointestinal: Positive for abdominal distention, diarrhea and nausea. Negative for blood in stool and vomiting.  Genitourinary: Negative.   Musculoskeletal: Negative.   Neurological: Negative.   Hematological: Negative.   Psychiatric/Behavioral: Negative.   All other systems reviewed and are negative.      Objective:    BP 124/62   Pulse 93   Temp 98.1 F (36.7 C) (Oral)   Wt 239 lb (108.4 kg)   LMP 06/13/2016   SpO2 97%   BMI 39.77 kg/m    Physical Exam  Constitutional: She is oriented to person, place, and time. She appears well-developed and well-nourished. No distress.  HENT:  Head: Normocephalic.  Eyes: Conjunctivae are normal.  Cardiovascular: Normal rate.   Pulmonary/Chest: Effort normal.  Abdominal: Soft. She exhibits no distension. There is no tenderness. There is no rebound and no guarding.  Neurological: She is alert and oriented to person, place, and time. No cranial nerve deficit.  Skin: Skin is warm and dry. She is not diaphoretic.  Psychiatric: She has a normal mood and affect. Her behavior is normal. Judgment and thought content normal.  Nursing note and vitals reviewed.         Assessment & Plan:   Diabetes mellitus type 2 in obese ([place]) - Plan: Hemoglobin A1c  Gastroparesis No Follow-up on file.

## 2016-06-22 NOTE — Assessment & Plan Note (Signed)
New- discussed tx options and referral to GI for definitive diagnosis. She would prefer to try reglan first. eRx sent. Call or return to clinic prn if these symptoms worsen or fail to improve as anticipated. The patient indicates understanding of these issues and agrees with the plan.

## 2016-06-22 NOTE — Assessment & Plan Note (Signed)
Recheck a1c today. Continue current rx. Decrease carbohydrate intake and discuss binge eating with her therapist. The patient indicates understanding of these issues and agrees with the plan.

## 2016-07-01 ENCOUNTER — Ambulatory Visit (INDEPENDENT_AMBULATORY_CARE_PROVIDER_SITE_OTHER): Payer: BLUE CROSS/BLUE SHIELD | Admitting: Family Medicine

## 2016-07-01 ENCOUNTER — Encounter: Payer: Self-pay | Admitting: Family Medicine

## 2016-07-01 VITALS — BP 122/70 | HR 110 | Temp 98.8°F | Wt 239.0 lb

## 2016-07-01 DIAGNOSIS — W19XXXA Unspecified fall, initial encounter: Secondary | ICD-10-CM

## 2016-07-01 DIAGNOSIS — R296 Repeated falls: Secondary | ICD-10-CM | POA: Diagnosis not present

## 2016-07-01 DIAGNOSIS — M67479 Ganglion, unspecified ankle and foot: Secondary | ICD-10-CM

## 2016-07-01 NOTE — Progress Notes (Signed)
Subjective:    Patient ID: Amanda Vasquez, female    DOB: 09-27-1970, 45 y.o.   MRN: [id number]  HPI This is a very pleasant 45 yo female who presents today following a fall yesterday. She was walking on wet brick and her right leg went forward and her left knee bent back. She is currently having pain at the back of her elbow and her left knee. Feels like she is falling more often. Not sure if falls related to loss of balance or other reason. Has an appointment with her ortho in 4 days for follow up of chronic left knee problems. Sees Dr. Hardin Negus for pain management and has plenty of pain medication and muscle relaxers at home to treat current pain. Has previously established with neuro, Dr. Jaynee Eagles.  She primarily wants to know if anything is broken or needs xrays.  Has also noted recurrence of ganglion cysts on dorsum of feet. Dr. Charlann Boxer treated these several years ago. She has one on each foot.   Past Medical History:  Diagnosis Date  . Acute meniscal tear of left knee   . Allergic rhinitis   . Chronic constipation   . Demyelinating disorder ([place])    brain lesion that touches thalamic  . Diabetes mellitus, type II ([place])    controlled with medication;  . Dyslipidemia   . General weakness    left hand and leg  . Generalized anxiety disorder    mostly controlled  . History of adenomatous polyp of colon   . History of colitis   . Knee pain   . Migraine   . 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 Wears glasses    Past Surgical History:  Procedure Laterality Date  . BREAST REDUCTION SURGERY Bilateral 1997  . CHOLECYSTECTOMY  1998  . KNEE ARTHROSCOPY Right 1993  . KNEE ARTHROSCOPY WITH MEDIAL MENISECTOMY Left 10/09/2015   Procedure: LEFT KNEE ARTHROSCOPY WITH PARTIAL  MEDIAL MENISCECTOMY; PATELLA-FEMORAL CHONDROPLASTY;  Surgeon: Rod Can, MD;  Location: [facility];  Service: Orthopedics;  Laterality: Left;  . TRANSTHORACIC ECHOCARDIOGRAM  03-02-2015   normal LVF,  ef 65-70%   Family History  Problem Relation Age of Onset  . Adopted: Yes   Social History  Substance Use Topics  . Smoking status: Former Smoker    Packs/day: 0.25    Years: 20.00    Types: Cigarettes    Start date: 06/08/1986    Quit date: 08/22/2010  . Smokeless tobacco: Never Used  . Alcohol use No      Review of Systems Per HPI    Objective:   Physical Exam  Constitutional: She is oriented to person, place, and time. She appears well-developed and well-nourished. No distress.  Obese.   HENT:  Head: Normocephalic and atraumatic.  Eyes: Conjunctivae are normal.  Neck: Normal range of motion. Neck supple.  Pulmonary/Chest: Effort normal.  Musculoskeletal:  Trunk, left arm and left knee examined. No ecchymosis, no erythema. Tender over left shoulder (chronic) and left lateral rib cage. Tender left distal bicep. Left knee with diffuse tenderness, no deformity, no effusion, no instability.  Bilateral feet with single, small rubbery nodules.    Neurological: She is alert and oriented to person, place, and time.  Very sensitive to light touch.   Skin:  Skin is warm and dry. She is not diaphoretic.  Psychiatric: She has a normal mood and affect. Her behavior is normal. Judgment and thought content normal.  Appears mildly anxious.   Vitals reviewed.     BP 122/70 (BP Location: Right Arm, Patient Position: Sitting, Cuff Size: Large)   Pulse (!) 110   Temp 98.8 F (37.1 C) (Oral)   Wt 239 lb (108.4 kg)   LMP 06/13/2016   SpO2 97%   BMI 39.77 kg/m  Wt Readings from Last 3 Encounters:  07/01/16 239 lb (108.4 kg)  06/22/16 239 lb (108.4 kg)  05/17/16 224 lb 8 oz (101.8 kg)       Assessment & Plan:  1. Fall, initial encounter - physical exam  reassuring - follow up as scheduled with ortho - continue current meds and heat prn  2. Frequent falls - patient will follow up with neuro (Dr. Jaynee Eagles) to help determine cause, could possibly benefit from PT/OT. She will make appointment.  3. Ganglion cyst of foot - suggested she return to Dr. Gardenia Phlegm since he successfully treated these in the past.    Clarene Reamer, FNP-BC  [facility], [place]  07/02/2016 6:04 AM

## 2016-07-01 NOTE — Progress Notes (Signed)
Pre visit review using our clinic review tool, if applicable. No additional management support is needed unless otherwise documented below in the visit note. 

## 2016-07-01 NOTE — Patient Instructions (Signed)
Please schedule an appointment with Dr. Jaynee Eagles regarding your increased falling/gait disturbance

## 2016-07-11 ENCOUNTER — Telehealth: Payer: Self-pay

## 2016-07-11 ENCOUNTER — Encounter: Payer: Self-pay | Admitting: Family Medicine

## 2016-07-11 NOTE — Telephone Encounter (Signed)
Pt left /vm; pt last seen 07/01/16 by Glenda Chroman FNP; pt said was noted at that visit pt had fluid in rt ear. Pt is now in [place] visiting family until after Thanksgiving holiday and pt is now afraid ear infection has come back; pt has recently seen Dr Deborra Medina for bilateral ear infection.Pt request med for ear infection. Pt will have pharmacy information when called back.

## 2016-07-11 NOTE — Telephone Encounter (Signed)
I did not evaluate her for this.  Will route to Debbie to see her thoughts.

## 2016-07-12 MED ORDER — AZITHROMYCIN 250 MG PO TABS: ORAL_TABLET | ORAL | 0 refills | Status: DC

## 2016-07-12 NOTE — Telephone Encounter (Signed)
Attempted to call patient, no answer. Looks like Dr. Deborra Medina sent her a Zpack yesterday.

## 2016-07-25 ENCOUNTER — Other Ambulatory Visit: Payer: Self-pay | Admitting: Family Medicine

## 2016-08-12 ENCOUNTER — Other Ambulatory Visit: Payer: Self-pay | Admitting: Family Medicine

## 2016-08-17 ENCOUNTER — Encounter: Payer: Self-pay | Admitting: Neurology

## 2016-08-17 ENCOUNTER — Ambulatory Visit (INDEPENDENT_AMBULATORY_CARE_PROVIDER_SITE_OTHER): Payer: BLUE CROSS/BLUE SHIELD | Admitting: Neurology

## 2016-08-17 VITALS — BP 153/82 | HR 93 | Ht 65.0 in | Wt 244.2 lb

## 2016-08-17 DIAGNOSIS — R29898 Other symptoms and signs involving the musculoskeletal system: Secondary | ICD-10-CM

## 2016-08-17 DIAGNOSIS — G890 Central pain syndrome: Secondary | ICD-10-CM

## 2016-08-17 DIAGNOSIS — R270 Ataxia, unspecified: Secondary | ICD-10-CM | POA: Diagnosis not present

## 2016-08-17 DIAGNOSIS — R269 Unspecified abnormalities of gait and mobility: Secondary | ICD-10-CM | POA: Diagnosis not present

## 2016-08-17 DIAGNOSIS — M545 Low back pain, unspecified: Secondary | ICD-10-CM

## 2016-08-17 DIAGNOSIS — G8929 Other chronic pain: Secondary | ICD-10-CM | POA: Diagnosis not present

## 2016-08-17 DIAGNOSIS — W19XXXA Unspecified fall, initial encounter: Secondary | ICD-10-CM

## 2016-08-17 NOTE — Progress Notes (Signed)
[facility]    Provider:  Dr Amanda Vasquez Referring Provider: Rowe Clack, MD Primary Care Physician:  Amanda Grant, MD  CC: Thalamic pain syndrome    Interval history 08/17/2016: Patient returns for follow up of thalamic pain syndrome. Extensive workup was completed including thorough labwork, LP, repeat MRis. Left-sided symptoms from thalamic pain syndrome and she follows with pain management. She does have multiple neurologic other neurologic and somatic complaints. She is here for falls, she tore her left meniscus. She is having falls, started in September she had one in September and in novemebr she has little ones almost every day. No dizziness, SOB, cp, she had a meniscal tear in February and she reinjured the knee but this is not the only reason she falls. All of a sudden she is on the ground, no alteration of awareness however. The last fall she was reaching for something she was reaching over for something and lost her balance and catches herself. No associated dizziness, SOB or other associated symptoms just lost her balance. She has ringing in her ears, murmurs in her ear, she can hear the electricity when she plugs things into the wall she can hear the electricity, no headaches. She has new weakness in her hands, she was diagnosed with CTS and she has swelling in the hands and a little tremor and can;t make a fist. Both hands. Her has a lot of pain in the bones of her hands. Will order physical therapy for gaot and balance and right lower back pain.   HPI: Amanda Vasquez is a 45 y.o. female here as a follow up. We have completed a thorough evaluation including images, lumbar puncture and labwork. MRi of the brain was repeated and showed T2/flair hyperintense focus in the deep white matter of the right hemisphere that extends to the left hypothalmus. Explained that thalamic pain syndrome could explain her left-sided symptoms. LP was negative for multiple  sclerosis. Oligoclonal bands were seen in the serum and are a non-specific finding and may be caused by a wide range of disorders including infection, inflammation and neoplasia. Patients with at least 2 clones in the csf and serum likely systemic only. Identical csf bands in the CSF and serum likely systemic immune activation but not of intrathecal synthesis (ocbs in the csf likely explained by passive movement from the serum). At patient's request, screened for systemic inflammatory causes which came back unremarkable. MRi of the cervical spine showed very mild degenerative changes. EMG/NCS of the upper extremity showed right>left CTS which can be contributing to her symptoms. She has been referred to hand surgery.   Today she has severe pain on the left, fasciculations, knots and bumps everywhere, it hurts to extend her elbow on the right. She is hypersensitive, even water on her skin or sheets on her skin on the left side hurts. She is in excruciating pain, even hurts to have her glasses on the left. She can't feel things as well on the left. The fine motor is diminished on the left. She did trigger point injections with Dr. Hardin Negus. She has a cyst on her right foot. She had an ovarian cyst that went away. She has extreme sensitivity and when she is pushing a cart, the vibration hurts her left hand. Her balance is poor, not what it used to be. She has had nausea and vomiting over the last few months. She has to lay in bed in the mornings, she gets very stiff even sitting around at  the house. She can't sit or she has extreme pain on both sides of her body and rectal pain. She denies low back pain or shooting pain in her legs. Mother said She stutters when she is in pain and can't get her point. She gets dizzy when she stands up too quickly. If she forgets her medicine, she wakes up with excruciating pain.   EMG/NCS bilateral uppers: 02/2015 Conclusion: This is an abnormal study. There is  electrophysiologic evidence of bilateral, moderately-severe, Right > Left Carpal Tunnel Syndrome. No suggestion of polyneuropathy or radiculopathy. Clinical correlation recommended.   MRi brain and cervical cord 01/2015;  IMPRESSION: This is a mildly abnormal MRI of the cervical spine showing mild degenerative changes at C3-C4 and C5-C6 that do not lead to any nerve root impingement. The spinal cord appears normal.  IMPRESSION: This MRI of the brain with and without contrast shows a T2/flair hyperintense focus in the deep white matter of the right hemisphere corresponding to the focus reported in 05/13/2010 thatt no longer shows hyperintensity on diffusion-weighted images. Additionally, there are about 5 other punctate T2/flair foci in the subcortical white matter, most of which were present on the prior MRI. These foci are nonspecific and could be due to chronic microvascular ischemic changes, migraine or cardio-emboli. Demyelination would be less likely to have this appearance. No acute findings are noted.  Echo: 02/2015: normal  Labs: ace, hiv, b12, folate, pan-anca, rpr, hep c, celiac, ana comprehensive panel, paraneoplastic panel, IFE, tsh, RF,cmp,cbc, esr, hgba1c, lipids were unremarkable - slightly elevated hgba1c at 7 and esr 31.   LP: protein, glucose, cell count and diff, ocbs, ms profile unremarkable   Initial visit 02/02/2015: Amanda Vasquez is a 45 y.o. female here as a referral from Dr. Asa Lente for complicated symptoms. Former Therapist, sports. Sounds like she has had a long journey and has been evaluated by multiple neurologists, pain specialists and had many pain procedures all of which have failed to find an etiology for her symptoms. One of her complaints is right arm pain. She has some shooting pain from the right side of the neck to the hand which started in November and have worsened. Goes into all 5 fingers. Feels like her whole hand is burning, like it is going to "shoot off",  she can't make a fist, worse at night, she gets swollen lumps in her right upper arm. She can push on the base of her neck and actually cause the pain to shoot down her arm. She also has longer-standing pain symptoms on the entire left side of the body, splits midline to the left almost exactly from head to toe, with sensitivity of skin. She has left-sided weakness. She has fatigue. She went to [facility] for MRIs and follow up in 2011 and 2012 for abnormal white matter changes. She also saw Dr. Krista Blue in 2011 and 2012. She has been evaluated by multiple neurologists for the same reasons, last 4 years ago as reported by patient. She was following at [facility] with Dr. Moshe Cipro. Has not had an MRi since 2012. No vision changes, has ringing and buzzing in her ears. Left sided issues started in 2011 when she was originally evaluated and diagnosed with a "demyelinating disorder". The left-sided symptoms are worsening. Cold makes her left-sided symptoms worse, she can't get out of bed in the winter. She is on pain medications, the gralise and oxycodone makes her symptoms better. She takes morphine ER. She is on disability due to her condition and follows  in pain clinic. She has also seen Dr. Krista Blue here at [facility]. She was diagnosed with RSD in the left foot and arm at some point and saw Dr. Nelva Bush in 2012 who tried doing multiple cervical and lumbar spinal blocks which did not help. She was also seen at [facility] and they did a spinal nerve ablation which did not work longer than a day. By 3-4pm she can't even hold up her head anymore, she has to prop it up against something because of tightness. She tries to get out more now since it is warmer, it takes her 45 minutes to get up and out of the bed in the mornings. In the cold weather she is bed ridden. She was adopted and she does not have infomration about birth family.   Reviewed notes, labs and imaging from outside physicians, which showed: MRI of the brain dated June 2012  showed a single nonenhancing 5 x 3 mm focus of increased T2 FLAIR signal intensity within the sub-occipital white matter of the right parietal lobe. No enhancing lesions.MRI of the cervical spine showed no disc protrusion, spinal canal or neuroforaminal stenosis at any level. Both were done with and without contrast.  She sees Dr. Unice Cobble who is her primary care. He also notes this reported "demyelinating syndrome" per patient. She was seen at [facility] by Dr. Moshe Cipro and [facility] neurology by Dr. Benjamin Stain. She was last seen at [facility] in July 2013 and apparently she became upset when MS treatments were not pursued. A hand surgeon diagnosed reflex sympathetic dystrophy . She follows in pain clinic.  Review of Systems: Patient complains of symptoms per HPI as well as the following symptoms: Fatigue, fever, unexpected weight change, excessive sweating, ringing in ears, light sensitivity, cold intolerance, heat intolerance, flushing, abdominal pain, constipation, nausea, environmental allergies, joint pain, joint swelling, back pain, aching muscles, muscle cramps, walking difficulty, neck pain, swollen lymph nodes, bruise bleed easily, memory loss, dizziness, burning, speech difficulty, weakness, tremors, decreased concentration, depression, nervousness and anxiety Pertinent negatives per HPI. All others negative.    Social History   Social History  . Marital status: Divorced    Spouse name: N/A  . Number of children: 0  . Years of education: 12+   Occupational History  . RN- long-term disability     Social History Main Topics  . Smoking status: Former Smoker    Packs/day: 0.25    Years: 20.00    Types: Cigarettes    Start date: 06/08/1986    Quit date: 08/22/2010  . Smokeless tobacco: Never Used  . Alcohol use No  . Drug use: No  . Sexual activity: Yes    Birth control/ protection: IUD     Comment: Mirena IUD placement Dec 2015   Other Topics Concern  . Not on file   Social  History Narrative   Divorced. Lives at home with herself.   Caffeine use: 1 cup coffee 3 times per week    Family History  Problem Relation Age of Onset  . Adopted: Yes    Past Medical History:  Diagnosis Date  . Acute meniscal tear of left knee   . Allergic rhinitis   . Chronic constipation   . Demyelinating disorder ([place])    brain lesion that touches thalamic  . Diabetes mellitus, type II ([place])    controlled with medication;  . Dyslipidemia   . General weakness    left hand and leg  . Generalized anxiety disorder  mostly controlled  . History of adenomatous polyp of colon   . History of colitis   . Knee pain   . Migraine   . 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 causing chronic pain on left side of body    follows with Dr Vear Clock (pain management)//  central nervous system left side pain when touched--  nacrotic dependence  . Wears glasses     Past Surgical History:  Procedure Laterality Date  . BREAST REDUCTION SURGERY Bilateral 1997  . CHOLECYSTECTOMY  1998  . KNEE ARTHROSCOPY Right 1993  . KNEE ARTHROSCOPY WITH MEDIAL MENISECTOMY Left 10/09/2015   Procedure: LEFT KNEE ARTHROSCOPY WITH PARTIAL MEDIAL MENISCECTOMY; PATELLA-FEMORAL CHONDROPLASTY;  Surgeon: Samson Frederic, MD;  Location: [facility];  Service: Orthopedics;  Laterality: Left;  . TRANSTHORACIC ECHOCARDIOGRAM  03-02-2015   normal LVF,  ef 65-70%    Current Outpatient Prescriptions  Medication Sig Dispense Refill  . ALPRAZolam (XANAX) 0.5 MG tablet Take 1 tablet (0.5 mg total) by mouth 4 (four) times daily as needed for anxiety. 120 tablet 4  . aspirin EC 325 MG tablet Take 1 tablet (325 mg total) by mouth 2 (two) times daily after a meal. 60 tablet 0  . atorvastatin (LIPITOR) 10 MG tablet TAKE 1 TABLET BY MOUTH EVERY EVENING 90 tablet 1  . b complex vitamins tablet Take 1 tablet by mouth daily.      . baclofen (LIORESAL) 10 MG tablet  Take 1 tablet (10 mg total) by mouth 3 (three) times daily. 30 each   . buPROPion (WELLBUTRIN XL) 300 MG 24 hr tablet Take 300 mg by mouth daily. 30 tablet 4  . Cetirizine HCl 10 MG CAPS Take 1 capsule (10 mg total) by mouth daily as needed. Take by mouth. (Patient taking differently: Take 10 mg by mouth every evening. Take by mouth.) 30 capsule   . Cholecalciferol (VITAMIN D3) 2000 UNITS TABS Take 100-2,000 Units by mouth daily.     [name] dronabinol (MARINOL) 2.5 MG capsule Take 2.5 mg by mouth daily as needed.  1  . DUEXIS 800-26.6 MG TABS Take 1 tablet by mouth 2 (two) times daily.  3  . DULoxetine (CYMBALTA) 60 MG capsule TAKE 1 CAPSULE BY MOUTH DAILY 90 capsule 1  . Exenatide ER (BYDUREON) 2 MG PEN Inject SQ weekly 1 each 3  . glipiZIDE (GLUCOTROL) 5 MG tablet TAKE ONE TABLET BY MOUTH EVERY MORNING 90 tablet 1  . GRALISE 600 MG TABS Take 1,800 mg by mouth every evening.     . metFORMIN (GLUCOPHAGE) 1000 MG tablet Take 1 tablet (1,000 mg total) by mouth 2 (two) times daily with a meal. 180 tablet 3  . metoCLOPramide (REGLAN) 5 MG tablet Take 1 tablet (5 mg total) by mouth 4 (four) times daily -  before meals and at bedtime. 120 tablet 3  . morphine (MS CONTIN) 60 MG 12 hr tablet Take 60 mg by mouth every 12 (twelve) hours.    . multivitamin-iron-minerals-folic acid (CENTRUM) chewable tablet Chew 1 tablet by mouth daily.    [name] oxyCODONE (OXY IR/ROXICODONE) 5 MG immediate release tablet Take 1 tablet (5 mg total) by mouth every 4 (four) hours as needed for severe pain. Per pain clinic Dr Vear Clock 30 tablet   . polyethylene glycol (MIRALAX / GLYCOLAX) packet Take 17 g by mouth daily.     [name] POTASSIUM CHLORIDE PO Take 198 mg by mouth daily.    . traZODone (DESYREL) 100 MG  tablet Take 1 tablet (100 mg total) by mouth at bedtime as needed for sleep. 30 tablet 4  . valACYclovir (VALTREX) 1000 MG tablet Take 1 tablet (1,000 mg total) by mouth daily as needed. Must estab w/new provider for future refills 60  tablet 2   No current facility-administered medications for this visit.     Allergies as of 08/17/2016 - Review Complete 08/17/2016  Allergen Reaction Noted  . Latex Hives 07/03/2008    Vitals: BP (!) 153/82 (BP Location: Right Arm, Patient Position: Sitting, Cuff Size: Large)   Pulse 93   Ht 5\' 5"  (1.651 m)   Wt 244 lb 3.2 oz (110.8 kg)   BMI 40.64 kg/m  Last Weight:  Wt Readings from Last 1 Encounters:  08/17/16 244 lb 3.2 oz (110.8 kg)   Last Height:   Ht Readings from Last 1 Encounters:  08/17/16 5\' 5"  (1.651 m)     Physical exam: Exam: Gen: NAD, conversant, well nourised, obese, well groomed  CV: RRR, no MRG. No Carotid Bruits. No peripheral edema, warm, nontender Eyes: Conjunctivae clear without exudates or hemorrhage  Neuro: Detailed Neurologic Exam  Speech:  Speech is normal; fluent and spontaneous with normal comprehension.  Cognition:  The patient is oriented to person, place, and time;   Cranial Nerves:  The pupils are equal, round, and reactive to light. Extraocular movements are intact. Trigeminal sensation is intact and the muscles of mastication are normal. The face is symmetric. The palate elevates in the midline. Hearing intact. Voice is normal. Shoulder shrug is normal. The tongue has normal motion without fasciculations.   Motor Observation:  No asymmetry, no atrophy, and no involuntary movements noted. Tone:  Normal muscle tone.   Posture:  Posture is normal. normal erect   Gait: can heel and toe walk, imbalance with heel-to-toe but can perform, sway on romberg. Wide based due to body habitus  Strength: left prox weakness with some giveway otherwise  Strength is V/V in the upper and lower limbs.    Sensation: Splits midline from the face to the thorax/abd/pelvis with hyperalgesia and dysesthesias on the left including left arm and leg.    reflexes: decrease left patellar otherwise  normal   Assessment/Plan: 45 year old patient with a complicated past medical history which includes thalamic pain syndrome, on disability,  chronic fatigue and chronic pain syndrome. We have completed a thorough evaluation including images, lumbar puncture and labwork. MRi of the brain was repeated and showed T2/flair hyperintense focus in the deep white matter of the right hemisphere that extends to the left hypothalmus. Explained that thalamic pain syndrome could explain her left-sided symptoms. LP was negative for multiple sclerosis. Oligoclonal bands were seen in the serum and are a non-specific finding and may be caused by a wide range of disorders including infection, inflammation and neoplasia. Patients with at least 2 clones in the csf and serum likely systemic only. Identical csf bands in the CSF and serum likely systemic immune activation but not of intrathecal synthesis (ocbs in the csf likely explained by passive movement from the serum). At patient's request, screened for systemic inflammatory causes which came back unremarkable. MRi of the cervical spine showed very mild degenerative changes. EMG/NCS of the upper extremity showed right>left CTS which can be contributing to her symptoms. She has been referred to hand surgery.  Discussed at length with patient. At this point she should f/u with hand surgery and her pain managagement specialist and continue to followup with her therapist.  Right low back pain without sciatica for one year, imbalance and recent falls: Physicalt therapy    Sarina Ill, MD  [facility] [address] [place], [place] [address]  Phone [phone] Fax [phone]  A total of 25 minutes was spent face-to-face with this patient. Over half this time was spent on counseling patient on the falls, ataxia, thalamic pain syndrome diagnosis and different diagnostic and therapeutic options available.

## 2016-08-17 NOTE — Patient Instructions (Addendum)
Remember to drink plenty of fluid, eat healthy meals and do not skip any meals. Try to eat protein with a every meal and eat a healthy snack such as fruit or nuts in between meals. Try to keep a regular sleep-wake schedule and try to exercise daily, particularly in the form of walking, 20-30 minutes a day, if you can.   As far as diagnostic testing: Physical therapy  I would like to see you back in 6 months, sooner if we need to. Please call us with any interim questions, concerns, problems, updates or refill requests.   Our phone number is [phone]. We also have an after hours call service for urgent matters and there is a physician on-call for urgent questions. For any emergencies you know to call 911 or go to the nearest emergency room

## 2016-09-01 ENCOUNTER — Telehealth: Payer: Self-pay | Admitting: Neurology

## 2016-09-01 NOTE — Telephone Encounter (Signed)
Amanda Vasquez- can you send her referral to place she listed below instead? You send her referral placed on 12/27 next door.

## 2016-09-01 NOTE — Telephone Encounter (Signed)
Pt called to advise her insurance will cover PT at [facility] at  [place] (p) [phone]

## 2016-09-05 NOTE — Telephone Encounter (Signed)
Referral has been sent . Thanks Dana.  

## 2016-09-12 NOTE — Telephone Encounter (Signed)
error 

## 2016-09-20 ENCOUNTER — Other Ambulatory Visit: Payer: Self-pay | Admitting: Family Medicine

## 2016-10-11 ENCOUNTER — Other Ambulatory Visit: Payer: Self-pay | Admitting: Family Medicine

## 2016-10-11 NOTE — Telephone Encounter (Signed)
Last f/u 08/2015 

## 2016-10-11 NOTE — Telephone Encounter (Signed)
Rx called in to requested pharmacy 

## 2016-10-20 ENCOUNTER — Other Ambulatory Visit: Payer: Self-pay | Admitting: Family Medicine

## 2016-10-25 ENCOUNTER — Other Ambulatory Visit: Payer: Self-pay | Admitting: Family Medicine

## 2016-11-22 ENCOUNTER — Other Ambulatory Visit: Payer: Self-pay | Admitting: Family Medicine

## 2016-11-22 NOTE — Telephone Encounter (Signed)
Alprazolam called into [facility], [place]: [phone].

## 2016-11-22 NOTE — Telephone Encounter (Signed)
Last office visit 06/1016 with D. Carlean Purl.  Last refilled 10/11/16 for #120 with no refills.  Ok to refill?

## 2016-12-27 ENCOUNTER — Other Ambulatory Visit: Payer: Self-pay | Admitting: Family Medicine

## 2017-01-02 ENCOUNTER — Other Ambulatory Visit: Payer: Self-pay | Admitting: Family Medicine

## 2017-01-02 NOTE — Telephone Encounter (Signed)
Alprazolam last refilled on 11/22/16 #120, Cymbalta last refilled on 10/25/16 Trazodone last refilled on 10/24/16 #30 +1 Last OV 06/22/16

## 2017-01-05 ENCOUNTER — Encounter: Payer: Self-pay | Admitting: Family Medicine

## 2017-01-19 ENCOUNTER — Ambulatory Visit (INDEPENDENT_AMBULATORY_CARE_PROVIDER_SITE_OTHER): Payer: BLUE CROSS/BLUE SHIELD | Admitting: Family Medicine

## 2017-01-19 ENCOUNTER — Encounter: Payer: Self-pay | Admitting: Family Medicine

## 2017-01-19 VITALS — BP 120/82 | HR 92 | Temp 98.9°F | Wt 237.0 lb

## 2017-01-19 DIAGNOSIS — E669 Obesity, unspecified: Secondary | ICD-10-CM | POA: Diagnosis not present

## 2017-01-19 DIAGNOSIS — E1169 Type 2 diabetes mellitus with other specified complication: Secondary | ICD-10-CM

## 2017-01-19 DIAGNOSIS — H3562 Retinal hemorrhage, left eye: Secondary | ICD-10-CM | POA: Diagnosis not present

## 2017-01-19 LAB — LIPID PANEL
Cholesterol: 166 mg/dL (ref 0–200)
HDL: 56.6 mg/dL (ref >39.00)
LDL Cholesterol: 71 mg/dL (ref 0–99)
NonHDL: 109.41
Total CHOL/HDL Ratio: 3
Triglycerides: 191 mg/dL — ABNORMAL HIGH (ref 0.0–149.0)
VLDL: 38.2 mg/dL (ref 0.0–40.0)

## 2017-01-19 LAB — COMPREHENSIVE METABOLIC PANEL
ALT: 28 U/L (ref 0–35)
AST: 18 U/L (ref 0–37)
Albumin: 4.2 g/dL (ref 3.5–5.2)
Alkaline Phosphatase: 63 U/L (ref 39–117)
BUN: 14 mg/dL (ref 6–23)
CO2: 26 mEq/L (ref 19–32)
Calcium: 9.6 mg/dL (ref 8.4–10.5)
Chloride: 105 mEq/L (ref 96–112)
Creatinine, Ser: 0.8 mg/dL (ref 0.40–1.20)
GFR: 81.76 mL/min (ref >60.00)
Glucose, Bld: 155 mg/dL — ABNORMAL HIGH (ref 70–99)
Potassium: 4.4 mEq/L (ref 3.5–5.1)
Sodium: 139 mEq/L (ref 135–145)
Total Bilirubin: 0.2 mg/dL (ref 0.2–1.2)
Total Protein: 7 g/dL (ref 6.0–8.3)

## 2017-01-19 LAB — HEMOGLOBIN A1C: Hgb A1c MFr Bld: 9.7 % — ABNORMAL HIGH (ref 4.6–6.5)

## 2017-01-19 LAB — TSH: TSH: 1.07 u[IU]/mL (ref 0.35–4.50)

## 2017-01-19 NOTE — Assessment & Plan Note (Signed)
Continue current rx. Hemoglobin a1c, cmet, lipid panel, TSH and urine micro done today.

## 2017-01-19 NOTE — Assessment & Plan Note (Signed)
Carotid dopplers ordered.

## 2017-01-19 NOTE — Patient Instructions (Signed)
Great to see you. Please stop by to see Marion on your way out.   

## 2017-01-19 NOTE — Progress Notes (Signed)
Subjective:   Patient ID: Amanda Vasquez, female    DOB: 04-18-1971, 46 y.o.   MRN: [id number]  Amanda Vasquez is a pleasant 46 y.o. year old female who presents to clinic today with Follow-up  on 01/19/2017  HPI:  DM-currently taking Metformin 1000 mg twice daily and Glucotrol 5 mg daily, along with Bydureon 2 mg weekly.  Checks FSBS twice weekly- ranging 90-150.  Denies any episodes of hypoglycemia recently.  Lab Results  Component Value Date   HGBA1C 7.3 (H) 06/22/2016   Lab Results  Component Value Date   CHOL 133 12/21/2015   HDL 56.80 12/21/2015   LDLCALC 55 12/21/2015   LDLDIRECT 138.8 06/22/2011   TRIG 105.0 12/21/2015   CHOLHDL 2 12/21/2015    Of note, saw her optometrist earlier this month (Dr. Bridgett Larsson), who did a dilated eye exam. Per pt, "bleeding on her eye" was noted on the left and she suggested that she have tests done on her carotid arteries.  Note received and reviewed from Dr. Bridgett Larsson from 12/20/16 which reports dot hemorrhages on her left retina.  She is not having any eye pain or visual changes currently.   Current Outpatient Prescriptions on File Prior to Visit  Medication Sig Dispense Refill  . ALPRAZolam (XANAX) 0.5 MG tablet TAKE ONE TABLET FOUR TIMES DAILY AS NEEDED FOR ANXIETY 120 tablet 0  . atorvastatin (LIPITOR) 10 MG tablet TAKE ONE TABLET BY MOUTH EVERY EVENING 90 tablet 3  . b complex vitamins tablet Take 1 tablet by mouth daily.      . baclofen (LIORESAL) 10 MG tablet Take 1 tablet (10 mg total) by mouth 3 (three) times daily. 30 each   . buPROPion (WELLBUTRIN XL) 300 MG 24 hr tablet TAKE ONE TABLET EVERY DAY NEEDS APPT FORFURTHER REFILLS 30 tablet 0  . Cetirizine HCl 10 MG CAPS Take 1 capsule (10 mg total) by mouth daily as needed. Take by mouth. (Patient taking differently: Take 10 mg by mouth every evening. Take by mouth.) 30 capsule   . Cholecalciferol (VITAMIN D3) 2000 UNITS TABS Take 100-2,000 Units by mouth daily.     [name] dronabinol (MARINOL)  2.5 MG capsule Take 2.5 mg by mouth daily as needed.  1  . DUEXIS 800-26.6 MG TABS Take 1 tablet by mouth 2 (two) times daily.  3  . DULoxetine (CYMBALTA) 60 MG capsule TAKE 1 CAPSULE EVERY DAY 90 capsule 3  . glipiZIDE (GLUCOTROL) 5 MG tablet TAKE ONE TABLET EVERY MORNING 90 tablet 3  . GRALISE 600 MG TABS Take 1,800 mg by mouth every evening.     . metFORMIN (GLUCOPHAGE) 1000 MG tablet Take 1 tablet (1,000 mg total) by mouth 2 (two) times daily with a meal. 180 tablet 3  . metoCLOPramide (REGLAN) 5 MG tablet TAKE 1 TABLET 4 TIMES A DAY BEFORE MEALSAND AT BEDTIME 120 tablet 3  . morphine (MS CONTIN) 60 MG 12 hr tablet Take 60 mg by mouth every 12 (twelve) hours.    . multivitamin-iron-minerals-folic acid (CENTRUM) chewable tablet Chew 1 tablet by mouth daily.    [name] oxyCODONE (OXY IR/ROXICODONE) 5 MG immediate release tablet Take 1 tablet (5 mg total) by mouth every 4 (four) hours as needed for severe pain. Per pain clinic Dr Hardin Negus 30 tablet   . polyethylene glycol (MIRALAX / GLYCOLAX) packet Take 17 g by mouth daily.     [name] POTASSIUM CHLORIDE PO Take 198 mg by mouth daily.    . traZODone (DESYREL) 100  MG tablet TAKE ONE TABLET AT BEDTIME AS NEEDED FORSLEEP 30 tablet 3  . valACYclovir (VALTREX) 1000 MG tablet Take 1 tablet (1,000 mg total) by mouth daily as needed. Must estab w/new provider for future refills 60 tablet 2   No current facility-administered medications on file prior to visit.     Allergies  Allergen Reactions  . Latex Hives    Past Medical History:  Diagnosis Date  . Acute meniscal tear of left knee   . Allergic rhinitis   . Chronic constipation   . Demyelinating disorder ([place])    brain lesion that touches thalamic  . Diabetes mellitus, type II ([place])    controlled with medication;  . Dyslipidemia   . General weakness    left hand and leg  . Generalized anxiety disorder    mostly controlled  . History of adenomatous polyp of colon   . History of colitis   . Knee  pain   . Migraine   . 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 Wears glasses     Past Surgical History:  Procedure Laterality Date  . BREAST REDUCTION SURGERY Bilateral 1997  . CHOLECYSTECTOMY  1998  . KNEE ARTHROSCOPY Right 1993  . KNEE ARTHROSCOPY WITH MEDIAL MENISECTOMY Left 10/09/2015   Procedure: LEFT KNEE ARTHROSCOPY WITH PARTIAL MEDIAL MENISCECTOMY; PATELLA-FEMORAL CHONDROPLASTY;  Surgeon: Rod Can, MD;  Location: [facility];  Service: Orthopedics;  Laterality: Left;  . TRANSTHORACIC ECHOCARDIOGRAM  03-02-2015   normal LVF,  ef 65-70%    Family History  Problem Relation Age of Onset  . Adopted: Yes    Social History   Social History  . Marital status: Divorced    Spouse name: N/A  . Number of children: 0  . Years of education: 12+   Occupational History  . RN- long-term disability     Social History Main Topics  . Smoking status: Former Smoker    Packs/day: 0.25    Years: 20.00    Types: Cigarettes    Start date: 06/08/1986    Quit date: 08/22/2010  . Smokeless tobacco: Never Used  . Alcohol use No  . Drug use: No  . Sexual activity: Yes    Birth control/ protection: IUD     Comment: Mirena IUD placement Dec 2015   Other Topics Concern  . Not on file   Social History Narrative   Divorced. Lives at home with herself.   Caffeine use: 1 cup coffee 3 times per week   The PMH, PSH, Social History, Family History, Medications, and allergies have been reviewed in [facility], and have been updated if relevant.   Review of Systems  Constitutional: Negative.   Eyes: Negative.   Respiratory: Negative.   Cardiovascular: Negative.   Musculoskeletal: Negative.   Hematological: Negative.   Psychiatric/Behavioral: Negative.   All other  systems reviewed and are negative.      Objective:    BP 120/82   Pulse 92   Temp 98.9 F (37.2 C)   Wt 237 lb (107.5 kg)   SpO2 99%   BMI 39.44 kg/m    Physical Exam  Constitutional: She is oriented to person, place, and time. She appears well-developed and well-nourished. No distress.  HENT:  Head: Normocephalic and atraumatic.  Neck: Carotid bruit is not  present.  Cardiovascular: Normal rate and regular rhythm.   Pulmonary/Chest: Effort normal and breath sounds normal.  Musculoskeletal: Normal range of motion.  Neurological: She is alert and oriented to person, place, and time. No cranial nerve deficit.  Skin: Skin is warm and dry. She is not diaphoretic.  Psychiatric: She has a normal mood and affect. Her behavior is normal. Judgment normal.  Nursing note and vitals reviewed.         Assessment & Plan:   Diabetes mellitus type 2 in obese ([place]) - Plan: Hemoglobin A1c, Comprehensive metabolic panel, Lipid panel, Microalbumin / creatinine urine ratio  Dot and blot hemorrhage, left - Plan: VAS US CAROTID No Follow-up on file.

## 2017-01-23 ENCOUNTER — Encounter: Payer: Self-pay | Admitting: Family Medicine

## 2017-01-23 NOTE — Addendum Note (Signed)
Addended by: Ellamae Sia on: 01/23/2017 09:30 AM   Modules accepted: Orders

## 2017-01-24 ENCOUNTER — Other Ambulatory Visit: Payer: Self-pay | Admitting: Family Medicine

## 2017-01-24 DIAGNOSIS — E1165 Type 2 diabetes mellitus with hyperglycemia: Secondary | ICD-10-CM

## 2017-01-25 ENCOUNTER — Ambulatory Visit: Payer: BLUE CROSS/BLUE SHIELD

## 2017-01-25 DIAGNOSIS — H3562 Retinal hemorrhage, left eye: Secondary | ICD-10-CM

## 2017-01-25 DIAGNOSIS — I6523 Occlusion and stenosis of bilateral carotid arteries: Secondary | ICD-10-CM | POA: Diagnosis not present

## 2017-01-30 ENCOUNTER — Ambulatory Visit (INDEPENDENT_AMBULATORY_CARE_PROVIDER_SITE_OTHER): Payer: BLUE CROSS/BLUE SHIELD | Admitting: Family Medicine

## 2017-01-30 ENCOUNTER — Encounter: Payer: Self-pay | Admitting: Family Medicine

## 2017-01-30 DIAGNOSIS — F32A Depression, unspecified: Secondary | ICD-10-CM

## 2017-01-30 DIAGNOSIS — F329 Major depressive disorder, single episode, unspecified: Secondary | ICD-10-CM | POA: Diagnosis not present

## 2017-01-30 DIAGNOSIS — F419 Anxiety disorder, unspecified: Secondary | ICD-10-CM | POA: Diagnosis not present

## 2017-01-30 MED ORDER — SERTRALINE HCL 50 MG PO TABS: 50.0000 mg | ORAL_TABLET | Freq: Every day | ORAL | 3 refills | Status: DC

## 2017-01-30 MED ORDER — DULOXETINE HCL 30 MG PO CPEP: ORAL_CAPSULE | ORAL | 0 refills | Status: DC

## 2017-01-30 NOTE — Progress Notes (Signed)
Subjective:   Patient ID: Amanda Vasquez, female    DOB: 1971/05/16, 46 y.o.   MRN: [id number]  Amanda Vasquez is a pleasant 46 y.o. year old female who presents to clinic today with Medication Management  on 01/30/2017  HPI:  Feels her depression has worsened. Has been on Wellbutrin 300 mg XL daily for years and cymbalta 60 mg daily for at least a year. Takes trazodone for sleep, sometimes takes an as needed xanax for insomnia as well.  Used to see a psychiatrist but he left and did not get re established with another provider.  Past several months, more depression and anxiety. Not wanting to leave the house.  Feels very isolated. Denies SI or HI.  Feels cymbalta was started for pain but didn't help much with pain or depression.  Current Outpatient Prescriptions on File Prior to Visit  Medication Sig Dispense Refill  . ALPRAZolam (XANAX) 0.5 MG tablet TAKE ONE TABLET FOUR TIMES DAILY AS NEEDED FOR ANXIETY 120 tablet 0  . atorvastatin (LIPITOR) 10 MG tablet TAKE ONE TABLET BY MOUTH EVERY EVENING 90 tablet 3  . b complex vitamins tablet Take 1 tablet by mouth daily.      . baclofen (LIORESAL) 10 MG tablet Take 1 tablet (10 mg total) by mouth 3 (three) times daily. 30 each   . buPROPion (WELLBUTRIN XL) 300 MG 24 hr tablet TAKE ONE TABLET EVERY DAY NEEDS APPT FORFURTHER REFILLS 30 tablet 0  . Cetirizine HCl 10 MG CAPS Take 1 capsule (10 mg total) by mouth daily as needed. Take by mouth. (Patient taking differently: Take 10 mg by mouth every evening. Take by mouth.) 30 capsule   . Cholecalciferol (VITAMIN D3) 2000 UNITS TABS Take 100-2,000 Units by mouth daily.     [name] dronabinol (MARINOL) 2.5 MG capsule Take 2.5 mg by mouth daily as needed.  1  . DUEXIS 800-26.6 MG TABS Take 1 tablet by mouth 2 (two) times daily.  3  . glipiZIDE (GLUCOTROL) 5 MG tablet TAKE ONE TABLET EVERY MORNING 90 tablet 3  . GRALISE 600 MG TABS Take 1,800 mg by mouth every evening.     . metFORMIN (GLUCOPHAGE) 1000 MG  tablet Take 1 tablet (1,000 mg total) by mouth 2 (two) times daily with a meal. 180 tablet 3  . metoCLOPramide (REGLAN) 5 MG tablet TAKE 1 TABLET 4 TIMES A DAY BEFORE MEALSAND AT BEDTIME 120 tablet 3  . morphine (MS CONTIN) 60 MG 12 hr tablet Take 60 mg by mouth every 12 (twelve) hours.    . multivitamin-iron-minerals-folic acid (CENTRUM) chewable tablet Chew 1 tablet by mouth daily.    [name] oxyCODONE (OXY IR/ROXICODONE) 5 MG immediate release tablet Take 1 tablet (5 mg total) by mouth every 4 (four) hours as needed for severe pain. Per pain clinic Dr Hardin Negus 30 tablet   . polyethylene glycol (MIRALAX / GLYCOLAX) packet Take 17 g by mouth daily.     [name] POTASSIUM CHLORIDE PO Take 198 mg by mouth daily.    . traZODone (DESYREL) 100 MG tablet TAKE ONE TABLET AT BEDTIME AS NEEDED FORSLEEP 30 tablet 3  . valACYclovir (VALTREX) 1000 MG tablet Take 1 tablet (1,000 mg total) by mouth daily as needed. Must estab w/new provider for future refills 60 tablet 2   No current facility-administered medications on file prior to visit.     Allergies  Allergen Reactions  . Latex Hives    Past Medical History:  Diagnosis Date  . Acute  meniscal tear of left knee   . Allergic rhinitis   . Chronic constipation   . Demyelinating disorder ([place])    brain lesion that touches thalamic  . Diabetes mellitus, type II ([place])    controlled with medication;  . Dyslipidemia   . General weakness    left hand and leg  . Generalized anxiety disorder    mostly controlled  . History of adenomatous polyp of colon   . History of colitis   . Knee pain   . Migraine   . 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 Wears glasses     Past Surgical History:  Procedure Laterality Date  . BREAST REDUCTION SURGERY  Bilateral 1997  . CHOLECYSTECTOMY  1998  . KNEE ARTHROSCOPY Right 1993  . KNEE ARTHROSCOPY WITH MEDIAL MENISECTOMY Left 10/09/2015   Procedure: LEFT KNEE ARTHROSCOPY WITH PARTIAL MEDIAL MENISCECTOMY; PATELLA-FEMORAL CHONDROPLASTY;  Surgeon: Rod Can, MD;  Location: [facility];  Service: Orthopedics;  Laterality: Left;  . TRANSTHORACIC ECHOCARDIOGRAM  03-02-2015   normal LVF,  ef 65-70%    Family History  Problem Relation Age of Onset  . Adopted: Yes    Social History   Social History  . Marital status: Divorced    Spouse name: N/A  . Number of children: 0  . Years of education: 12+   Occupational History  . RN- long-term disability     Social History Main Topics  . Smoking status: Former Smoker    Packs/day: 0.25    Years: 20.00    Types: Cigarettes    Start date: 06/08/1986    Quit date: 08/22/2010  . Smokeless tobacco: Never Used  . Alcohol use No  . Drug use: No  . Sexual activity: Yes    Birth control/ protection: IUD     Comment: Mirena IUD placement Dec 2015   Other Topics Concern  . Not on file   Social History Narrative   Divorced. Lives at home with herself.   Caffeine use: 1 cup coffee 3 times per week   The PMH, PSH, Social History, Family History, Medications, and allergies have been reviewed in [facility], and have been updated if relevant.   Review of Systems  Psychiatric/Behavioral: Positive for dysphoric mood. Negative for agitation, behavioral problems, confusion, decreased concentration, hallucinations, self-injury, sleep disturbance and suicidal ideas. The patient is nervous/anxious. The patient is not hyperactive.   All other systems reviewed and are negative.      Objective:    BP 110/70   Pulse (!) 122   Wt 239 lb (108.4 kg)   SpO2 97%   BMI 39.77 kg/m    Physical Exam  Constitutional: She is oriented to person, place, and time. She appears well-developed and well-nourished. No distress.  HENT:  Head: Normocephalic  and atraumatic.  Eyes: Conjunctivae are normal.  Cardiovascular: Normal rate.   Pulmonary/Chest: Effort normal.  Musculoskeletal: Normal range of motion.  Neurological: She is alert and oriented to person, place, and time. No cranial nerve deficit.  Skin: Skin is warm and dry. She is not diaphoretic.  Psychiatric: She has a normal mood and affect. Her behavior is normal. Judgment and thought content normal.  Nursing note and vitals reviewed.         Assessment & Plan:   Anxiety  and depression No Follow-up on file.

## 2017-01-30 NOTE — Assessment & Plan Note (Signed)
>  25 minutes spent in face to face time with patient, >50% spent in counselling or coordination of care Deteriorated. Will wean off cymbalta- start zoloft- see AVS for details.  She will update me in a few weeks.

## 2017-01-30 NOTE — Patient Instructions (Signed)
We are weaning off of cymbalta- Take a 60 mg capsule every other day for 1 week and then take a 30 mg capsule every other day 2 weeks.    Start Zoloft 50 mg. Patient is to take 1/2 tablet daily for 10 days, then advance to 1 full tablet thereafter. We discussed possible side effects of headache, GI upset, drowsiness.  Please keep me updated.

## 2017-02-10 ENCOUNTER — Other Ambulatory Visit: Payer: Self-pay | Admitting: Family Medicine

## 2017-02-10 NOTE — Telephone Encounter (Signed)
Last refill 01/02/17 Last OV 01/30/17 Ok to refill?

## 2017-02-10 NOTE — Telephone Encounter (Signed)
Called into [facility] - [place], [place] - [address]: [phone]  

## 2017-02-15 ENCOUNTER — Other Ambulatory Visit: Payer: Self-pay | Admitting: Family Medicine

## 2017-02-15 NOTE — Telephone Encounter (Signed)
Last refill 12/27/16 Last OV 01/30/17 Ok to refill?

## 2017-03-15 ENCOUNTER — Other Ambulatory Visit: Payer: Self-pay | Admitting: Family Medicine

## 2017-03-16 NOTE — Telephone Encounter (Signed)
Faxed to  [place], [place]: [phone]

## 2017-03-16 NOTE — Telephone Encounter (Signed)
Last refill 02/10/17  Last OV 01/30/17 Ok to refill?

## 2017-05-15 ENCOUNTER — Telehealth: Payer: Self-pay | Admitting: Neurology

## 2017-05-15 ENCOUNTER — Other Ambulatory Visit: Payer: Self-pay | Admitting: Family Medicine

## 2017-05-15 NOTE — Telephone Encounter (Signed)
I called pt and LMVM for her that calling her back.  Dr. Jaynee Eagles last saw her 07/2016, for issues other then migraine.  I instructed her to call her pcp, or go to urgent care/ED if severe.

## 2017-05-15 NOTE — Telephone Encounter (Signed)
Pt states she has only had 1 migraine before and to be on day 11 is very concerning for her because she has nothing to take and is asking for a call back please

## 2017-05-17 ENCOUNTER — Encounter: Payer: Self-pay | Admitting: Family Medicine

## 2017-05-17 ENCOUNTER — Ambulatory Visit (INDEPENDENT_AMBULATORY_CARE_PROVIDER_SITE_OTHER): Payer: Medicare Other | Admitting: Family Medicine

## 2017-05-17 DIAGNOSIS — Z23 Encounter for immunization: Secondary | ICD-10-CM | POA: Diagnosis not present

## 2017-05-17 DIAGNOSIS — G43019 Migraine without aura, intractable, without status migrainosus: Secondary | ICD-10-CM

## 2017-05-17 DIAGNOSIS — G43909 Migraine, unspecified, not intractable, without status migrainosus: Secondary | ICD-10-CM | POA: Insufficient documentation

## 2017-05-17 MED ORDER — KETOROLAC TROMETHAMINE 60 MG/2ML IM SOLN
60.0000 mg | Freq: Once | INTRAMUSCULAR | Status: AC
  Administered 2017-05-17: 60 mg via INTRAMUSCULAR

## 2017-05-17 MED ORDER — KETOROLAC TROMETHAMINE 60 MG/2ML IM SOLN: 60.0000 mg | Freq: Once | INTRAMUSCULAR | Status: AC

## 2017-05-17 NOTE — Assessment & Plan Note (Signed)
IM toradol today. She will update me tomorrow.

## 2017-05-17 NOTE — Progress Notes (Signed)
Subjective:   Patient ID: Amanda Vasquez, female    DOB: [birth year], 46 y.o.   MRN: [id number]  Amanda Vasquez is a pleasant 46 y.o. year old female who presents to clinic today with Migraine  on 05/17/2017  HPI:  Has had only one other migraine in her life - 20 years ago.  This feels the same- has had this headache for 11 days.  Associated with photophobia and nausea. No vomiting.  No other focal neurological deficits.  Has tried ibuprofen.  Current Outpatient Prescriptions on File Prior to Visit  Medication Sig Dispense Refill  . ALPRAZolam (XANAX) 0.5 MG tablet TAKE ONE TABLET BY MOUTH FOUR TIMES DAILY AS NEEDED FOR ANXIETY 120 tablet 3  . atorvastatin (LIPITOR) 10 MG tablet TAKE ONE TABLET BY MOUTH EVERY EVENING 90 tablet 3  . b complex vitamins tablet Take 1 tablet by mouth daily.      . baclofen (LIORESAL) 10 MG tablet Take 1 tablet (10 mg total) by mouth 3 (three) times daily. 30 each   . buPROPion (WELLBUTRIN XL) 300 MG 24 hr tablet TAKE ONE TABLET BY MOUTH EVERY DAY *NEEDS OFFICE VISIT FOR MORE REFILLS* 30 tablet 5  . BYDUREON 2 MG SRER INJECT SUB-Q WEEKLY AS DIRECTED 4 each 5  . Cetirizine HCl 10 MG CAPS Take 1 capsule (10 mg total) by mouth daily as needed. Take by mouth. (Patient taking differently: Take 10 mg by mouth every evening. Take by mouth.) 30 capsule   . Cholecalciferol (VITAMIN D3) 2000 UNITS TABS Take 100-2,000 Units by mouth daily.     [name] dronabinol (MARINOL) 2.5 MG capsule Take 2.5 mg by mouth daily as needed.  1  . DUEXIS 800-26.6 MG TABS Take 1 tablet by mouth 2 (two) times daily.  3  . DULoxetine (CYMBALTA) 30 MG capsule 1 capsule every other day for 2 weeks. 14 capsule 0  . glipiZIDE (GLUCOTROL) 5 MG tablet TAKE ONE TABLET EVERY MORNING 90 tablet 3  . GRALISE 600 MG TABS Take 1,800 mg by mouth every evening.     . metFORMIN (GLUCOPHAGE) 1000 MG tablet Take 1 tablet (1,000 mg total) by mouth 2 (two) times daily with a meal. 180 tablet 3  . metoCLOPramide  (REGLAN) 5 MG tablet TAKE 1 TABLET 4 TIMES A DAY BEFORE MEALSAND AT BEDTIME 120 tablet 3  . morphine (MS CONTIN) 60 MG 12 hr tablet Take 60 mg by mouth every 12 (twelve) hours.    . multivitamin-iron-minerals-folic acid (CENTRUM) chewable tablet Chew 1 tablet by mouth daily.    [name] oxyCODONE (OXY IR/ROXICODONE) 5 MG immediate release tablet Take 1 tablet (5 mg total) by mouth every 4 (four) hours as needed for severe pain. Per pain clinic Dr Hardin Negus 30 tablet   . polyethylene glycol (MIRALAX / GLYCOLAX) packet Take 17 g by mouth daily.     [name] POTASSIUM CHLORIDE PO Take 198 mg by mouth daily.    . sertraline (ZOLOFT) 50 MG tablet TAKE ONE TABLET BY MOUTH EVERY DAY 30 tablet 3  . traZODone (DESYREL) 100 MG tablet TAKE 1 TABLET BY MOUTH AT BEDTIME AS NEEDED FOR SLEEP 30 tablet 3  . valACYclovir (VALTREX) 1000 MG tablet Take 1 tablet (1,000 mg total) by mouth daily as needed. Must estab w/new provider for future refills 60 tablet 2   No current facility-administered medications on file prior to visit.     Allergies  Allergen Reactions  . Latex Hives    Past Medical History:  Diagnosis Date  . Acute meniscal tear of left knee   . Allergic rhinitis   . Chronic constipation   . Demyelinating disorder ([place])    brain lesion that touches thalamic  . Diabetes mellitus, type II ([place])    controlled with medication;  . Dyslipidemia   . General weakness    left hand and leg  . Generalized anxiety disorder    mostly controlled  . History of adenomatous polyp of colon   . History of colitis   . Knee pain   . Migraine   . 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 Wears glasses     Past Surgical History:  Procedure Laterality Date  . BREAST REDUCTION SURGERY Bilateral 1997  .  CHOLECYSTECTOMY  1998  . KNEE ARTHROSCOPY Right 1993  . KNEE ARTHROSCOPY WITH MEDIAL MENISECTOMY Left 10/09/2015   Procedure: LEFT KNEE ARTHROSCOPY WITH PARTIAL MEDIAL MENISCECTOMY; PATELLA-FEMORAL CHONDROPLASTY;  Surgeon: Rod Can, MD;  Location: [facility];  Service: Orthopedics;  Laterality: Left;  . TRANSTHORACIC ECHOCARDIOGRAM  03-02-2015   normal LVF,  ef 65-70%    Family History  Problem Relation Age of Onset  . Adopted: Yes    Social History   Social History  . Marital status: Divorced    Spouse name: N/A  . Number of children: 0  . Years of education: 12+   Occupational History  . RN- long-term disability     Social History Main Topics  . Smoking status: Former Smoker    Packs/day: 0.25    Years: 20.00    Types: Cigarettes    Start date: 06/08/1986    Quit date: 08/22/2010  . Smokeless tobacco: Never Used  . Alcohol use No  . Drug use: No  . Sexual activity: Yes    Birth control/ protection: IUD     Comment: Mirena IUD placement Dec 2015   Other Topics Concern  . Not on file   Social History Narrative   Divorced. Lives at home with herself.   Caffeine use: 1 cup coffee 3 times per week   The PMH, PSH, Social History, Family History, Medications, and allergies have been reviewed in [facility], and have been updated if relevant.   Review of Systems  Constitutional: Negative.   Eyes: Positive for photophobia.  Gastrointestinal: Positive for nausea. Negative for vomiting.  Neurological: Positive for headaches. Negative for dizziness, tremors, seizures, syncope, facial asymmetry, speech difficulty, weakness, light-headedness and numbness.  Hematological: Negative.   Psychiatric/Behavioral: Negative.   All other systems reviewed and are negative.      Objective:    BP 118/70   Pulse (!) 115   Ht 5\' 5"  (1.651 m)   Wt 237 lb (107.5 kg)   SpO2 95%   BMI 39.44 kg/m    Physical Exam  Constitutional: She is oriented to person, place, and  time. She appears well-nourished.  + photophobia  HENT:  Head: Normocephalic.  Eyes: Conjunctivae are normal.  Cardiovascular: Normal rate.   Pulmonary/Chest: Effort normal.  Musculoskeletal: Normal range of motion.  Neurological: She is alert and oriented to person, place, and time.  Skin: Skin is warm and dry.  Psychiatric: She has a normal mood and affect. Her behavior is normal. Judgment and thought content normal.  Nursing note and vitals reviewed.  Assessment & Plan:   Intractable migraine without aura and without status migrainosus - Plan: ketorolac (TORADOL) injection 60 mg No Follow-up on file.

## 2017-05-17 NOTE — Addendum Note (Signed)
Addended by: Mady Haagensen on: 05/17/2017 01:23 PM   Modules accepted: Orders

## 2017-05-18 ENCOUNTER — Telehealth: Payer: Self-pay

## 2017-05-18 DIAGNOSIS — R519 Headache, unspecified: Secondary | ICD-10-CM

## 2017-05-18 DIAGNOSIS — R51 Headache: Principal | ICD-10-CM

## 2017-05-18 NOTE — Telephone Encounter (Signed)
I'm sorry to hear she is not feeling better.  I would like to order an MRI of her brain.  Please let me know if she is okay with that and I will place the order.

## 2017-05-18 NOTE — Telephone Encounter (Signed)
Pt left v/m; pt was seen on 05/17/17 and was given toradol injection; pt H/A is no better and pt is very concerned. Pt said earlier this summer carotid artery scan was ordered due to bleeding behind eye and pt wonders if that could be related to H/A. Pt request cb with what is next step for pt to do.[facility].

## 2017-05-19 ENCOUNTER — Other Ambulatory Visit: Payer: Self-pay

## 2017-05-19 NOTE — Telephone Encounter (Signed)
Pt aware that someone from this office will call her with appt.

## 2017-05-19 NOTE — Telephone Encounter (Signed)
Left detailed message.  Pt to call back to let us know if she's okay with getting an MRI.

## 2017-05-19 NOTE — Telephone Encounter (Signed)
PT would like to sch the MRI as discussed.

## 2017-05-19 NOTE — Telephone Encounter (Signed)
Order entered

## 2017-05-25 ENCOUNTER — Ambulatory Visit (HOSPITAL_COMMUNITY)
Admission: RE | Admit: 2017-05-25 | Discharge: 2017-05-25 | Disposition: A | Discharge status: Home / Self Care | Payer: Medicare Other | Source: Ambulatory Visit | Attending: Family Medicine | Admitting: Family Medicine

## 2017-05-25 DIAGNOSIS — R519 Headache, unspecified: Secondary | ICD-10-CM

## 2017-05-25 DIAGNOSIS — G43909 Migraine, unspecified, not intractable, without status migrainosus: Secondary | ICD-10-CM | POA: Diagnosis not present

## 2017-05-25 DIAGNOSIS — R51 Headache: Secondary | ICD-10-CM | POA: Insufficient documentation

## 2017-05-25 LAB — POCT I-STAT CREATININE: Creatinine, Ser: 0.6 mg/dL (ref 0.44–1.00)

## 2017-05-25 MED ORDER — GADOBENATE DIMEGLUMINE 529 MG/ML IV SOLN
20.0000 mL | Freq: Once | INTRAVENOUS | Status: AC | PRN
  Administered 2017-05-25: 20 mL via INTRAVENOUS

## 2017-05-26 ENCOUNTER — Encounter: Payer: Self-pay | Admitting: Family Medicine

## 2017-05-26 ENCOUNTER — Ambulatory Visit (INDEPENDENT_AMBULATORY_CARE_PROVIDER_SITE_OTHER): Payer: Medicare Other | Admitting: Family Medicine

## 2017-05-26 VITALS — BP 124/62 | HR 109 | Temp 98.1°F | Ht 65.0 in | Wt 237.8 lb

## 2017-05-26 DIAGNOSIS — G43019 Migraine without aura, intractable, without status migrainosus: Secondary | ICD-10-CM

## 2017-05-26 MED ORDER — PREDNISONE 10 MG PO TABS: ORAL_TABLET | ORAL | 0 refills | Status: DC

## 2017-05-26 MED ORDER — PROMETHAZINE HCL 25 MG PO TABS: 25.0000 mg | ORAL_TABLET | Freq: Three times a day (TID) | ORAL | 0 refills | Status: DC | PRN
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
## 2017-05-26 NOTE — Patient Instructions (Addendum)
Try to follow good headache habits  Drink lots of water  Avoid caffeine as much as you can as a habit - but some during a headache can help Try to go to bed and get up at the same time every day  Regular exercise helps (walking)  Avoid alcohol     Try phenergan at bedtime ( a nausea medicine that helps with headache and will sedate)  Let's try a lower dose prednisone taper 30 mg    (if you have intolerable side effects please let us know)  Keep your neuro appt for the end of the month   Update [place] if no improvement

## 2017-05-28 NOTE — Assessment & Plan Note (Addendum)
Complex- several weeks  Many triggers  On several psychiatric medications and pain medications that can sensitize to headache Disc headache lifestyle habits Abrupt w/d from recent ibuprofen may be trigger  Wants to try low dose prednisone to break cycle (if side eff will alert [place] - has had problems with high dose in the past)  Px phenergan for nausea and ha  Disc hydration  Reassuring exam and MRI yesterday  Will d/c with neurologist at f/u as well  >25 minutes spent in face to face time with patient, >50% spent in counselling or coordination of care including plan for future care and prevention

## 2017-05-30 ENCOUNTER — Encounter: Payer: Self-pay | Admitting: Internal Medicine

## 2017-05-30 ENCOUNTER — Ambulatory Visit (INDEPENDENT_AMBULATORY_CARE_PROVIDER_SITE_OTHER): Payer: Medicare Other | Admitting: Internal Medicine

## 2017-05-30 VITALS — BP 124/80 | HR 96 | Ht 66.0 in | Wt 234.0 lb

## 2017-05-30 DIAGNOSIS — E1165 Type 2 diabetes mellitus with hyperglycemia: Secondary | ICD-10-CM | POA: Diagnosis not present

## 2017-05-30 LAB — POCT GLYCOSYLATED HEMOGLOBIN (HGB A1C): Hemoglobin A1C: 8.9

## 2017-05-30 MED ORDER — EMPAGLIFLOZIN 25 MG PO TABS: 25.0000 mg | ORAL_TABLET | Freq: Every day | ORAL | 5 refills | Status: DC

## 2017-05-30 NOTE — Patient Instructions (Addendum)
Please continue: - Metformin 1000 mg 2x a day, with meals - Glipizide 5 mg in am, before b'fast - Bydureon 2 mg weekly  Please start: - Jardiance 25 mg daily in am, before b'fast  Please return in 1.5 months with your sugar log.   Please let me know if the sugars are consistently <80 or >200.  PATIENT INSTRUCTIONS FOR TYPE 2 DIABETES:  DIET AND EXERCISE Diet and exercise is an important part of diabetic treatment.  We recommended aerobic exercise in the form of brisk walking (working between 40-60% of maximal aerobic capacity, similar to brisk walking) for 150 minutes per week (such as 30 minutes five days per week) along with 3 times per week performing 'resistance' training (using various gauge rubber tubes with handles) 5-10 exercises involving the major muscle groups (upper body, lower body and core) performing 10-15 repetitions (or near fatigue) each exercise. Start at half the above goal but build slowly to reach the above goals. If limited by weight, joint pain, or disability, we recommend daily walking in a swimming pool with water up to waist to reduce pressure from joints while allow for adequate exercise.    BLOOD GLUCOSES Monitoring your blood glucoses is important for continued management of your diabetes. Please check your blood glucoses 2-4 times a day: fasting, before meals and at bedtime (you can rotate these measurements - e.g. one day check before the 3 meals, the next day check before 2 of the meals and before bedtime, etc.).   HYPOGLYCEMIA (low blood sugar) Hypoglycemia is usually a reaction to not eating, exercising, or taking too much insulin/ other diabetes drugs.  Symptoms include tremors, sweating, hunger, confusion, headache, etc. Treat IMMEDIATELY with 15 grams of Carbs: . 4 glucose tablets .  cup regular juice/soda . 2 tablespoons raisins . 4 teaspoons sugar . 1 tablespoon honey Recheck blood glucose in 15 mins and repeat above if still symptomatic/blood  glucose <100.  RECOMMENDATIONS TO REDUCE YOUR RISK OF DIABETIC COMPLICATIONS: * Take your prescribed MEDICATION(S) * Follow a DIABETIC diet: Complex carbs, fiber rich foods, (monounsaturated and polyunsaturated) fats * AVOID saturated/trans fats, high fat foods, >2,300 mg salt per day. * EXERCISE at least 5 times a week for 30 minutes or preferably daily.  * DO NOT SMOKE OR DRINK more than 1 drink a day. * Check your FEET every day. Do not wear tightfitting shoes. Contact us if you develop an ulcer * See your EYE doctor once a year or more if needed * Get a FLU shot once a year * Get a PNEUMONIA vaccine once before and once after age 51 years  GOALS:  * Your Hemoglobin A1c of <7%  * fasting sugars need to be <130 * after meals sugars need to be <180 (2h after you start eating) * Your Systolic BP should be 725 or lower  * Your Diastolic BP should be 80 or lower  * Your HDL (Good Cholesterol) should be 40 or higher  * Your LDL (Bad Cholesterol) should be 100 or lower. * Your Triglycerides should be 150 or lower  * Your Urine microalbumin (kidney function) should be <30 * Your Body Mass Index should be 25 or lower    Please consider the following ways to cut down carbs and fat and increase fiber and micronutrients in your diet: - substitute whole grain for white bread or pasta - substitute brown rice for white rice - substitute 90-calorie flat bread pieces for slices of bread when possible -  substitute sweet potatoes or yams for white potatoes - substitute humus for margarine - substitute tofu for cheese when possible - substitute almond or rice milk for regular milk (would not drink soy milk daily due to concern for soy estrogen influence on breast cancer risk) - substitute dark chocolate for other sweets when possible - substitute water - can add lemon or orange slices for taste - for diet sodas (artificial sweeteners will trick your body that you can eat sweets without getting  calories and will lead you to overeating and weight gain in the long run) - do not skip breakfast or other meals (this will slow down the metabolism and will result in more weight gain over time)  - can try smoothies made from fruit and almond/rice milk in am instead of regular breakfast - can also try old-fashioned (not instant) oatmeal made with almond/rice milk in am - order the dressing on the side when eating salad at a restaurant (pour less than half of the dressing on the salad) - eat as little meat as possible - can try juicing, but should not forget that juicing will get rid of the fiber, so would alternate with eating raw veg./fruits or drinking smoothies - use as little oil as possible, even when using olive oil - can dress a salad with a mix of balsamic vinegar and lemon juice, for e.g. - use agave nectar, stevia sugar, or regular sugar rather than artificial sweateners - steam or broil/roast veggies  - snack on veggies/fruit/nuts (unsalted, preferably) when possible, rather than processed foods - reduce or eliminate aspartame in diet (it is in diet sodas, chewing gum, etc) Read the labels!  Try to read Dr. Janene Harvey book: "Program for Reversing Diabetes" for other ideas for healthy eating. Please look up "The Engine 2 diet" by Christa See.

## 2017-05-30 NOTE — Progress Notes (Signed)
Patient ID: Amanda Vasquez, female   DOB: [birth year], 46 y.o.   MRN: [id number]   HPI: Amanda Vasquez is a 46 y.o.-year-old female, referred by her PCP, Dr. Deborra Medina, for management of DM2, dx in ~1997, non-insulin-dependent, uncontrolled, without long term complications.  Last hemoglobin A1c was higher - had knee Sx 09/2015 >> could not exercise: Lab Results  Component Value Date   HGBA1C 9.7 (H) 01/19/2017   HGBA1C 7.3 (H) 06/22/2016   HGBA1C 6.9 (H) 03/10/2016   She has migraines >> steroid tapers.   Pt is on a regimen of: - Metformin 1000 mg 2x a day, with meals - Glipizide 5 mg in am - Bydureon 2 mg weekly - since 2017  Pt checks her sugars 1-2x a week: - am: 150-200s - 2h after b'fast: n/c - before lunch: n/c - 2h after lunch: n/c - before dinner: n/c - 2h after dinner: n/c - bedtime: n/c - nighttime: n/c No lows. Lowest sugar was 90-120; she has hypoglycemia awareness at 90.  Highest sugar was 200s.  Glucometer: AccuChek  Pt's meals are: - Breakfast: hot tea, toast  - Lunch: ham and PB sandwich - Dinner: soup , salad, sometimes chicken - Snacks: muffin; no sodas  - no CKD, last BUN/creatinine:  Lab Results  Component Value Date   BUN 14 01/19/2017   BUN 15 03/10/2016   CREATININE 0.60 05/25/2017   CREATININE 0.80 01/19/2017   - last set of lipids: Lab Results  Component Value Date   CHOL 166 01/19/2017   HDL 56.60 01/19/2017   LDLCALC 71 01/19/2017   LDLDIRECT 138.8 06/22/2011   TRIG 191.0 (H) 01/19/2017   CHOLHDL 3 01/19/2017  On Lipitor 10 mg daily. - last eye exam was in 06/2016. + DR OS (mild NP DR). - no numbness and tingling in her feet.  Unclear  FH of DM  - pt adopted.  ROS: Constitutional: + weight gain, + fatigue, no subjective hyperthermia/hypothermia Eyes: no blurry vision, no xerophthalmia ENT: no sore throat, no nodules palpated in throat, no dysphagia/odynophagia, no hoarseness, + tinnitus Cardiovascular: no CP/SOB/palpitations/leg  swelling Respiratory: no cough/SOB Gastrointestinal: + N/no V/D/+ C Musculoskeletal: + both: muscle/joint aches Skin: no rashes Neurological: no tremors/numbness/tingling/dizziness, + HA Psychiatric: + bothdepression/anxiety + low libido  Past Medical History:  Diagnosis Date  . Acute meniscal tear of left knee   . Allergic rhinitis   . Chronic constipation   . Demyelinating disorder ([place])    brain lesion that touches thalamic  . Diabetes mellitus, type II ([place])    controlled with medication;  . Dyslipidemia   . General weakness    left hand and leg  . Generalized anxiety disorder    mostly controlled  . History of adenomatous polyp of colon   . History of colitis   . Knee pain   . Migraine   . 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 Wears glasses    Past Surgical History:  Procedure Laterality Date  . BREAST REDUCTION SURGERY Bilateral 1997  . CHOLECYSTECTOMY  1998  . KNEE ARTHROSCOPY Right 1993  . KNEE ARTHROSCOPY WITH MEDIAL MENISECTOMY Left 10/09/2015   Procedure: LEFT KNEE ARTHROSCOPY WITH PARTIAL MEDIAL MENISCECTOMY; PATELLA-FEMORAL CHONDROPLASTY;  Surgeon: Rod Can, MD;  Location: [facility];  Service: Orthopedics;  Laterality: Left;  . TRANSTHORACIC ECHOCARDIOGRAM  03-02-2015   normal LVF,  ef 65-70%   Social History   Social History  . Marital status: Divorced    Spouse name: N/A  . Number of children: 0  . Years of education: 12+   Occupational History  . RN- long-term disability     Social History Main Topics  . Smoking status: Former Smoker    Packs/day: 0.25    Years: 20.00    Types: Cigarettes    Start date: 06/08/1986    Quit date: 08/22/2010  . Smokeless tobacco: Never Used  . Alcohol use No  . Drug use: No  .  Sexual activity: Yes    Birth control/ protection: IUD     Comment: Mirena IUD placement Dec 2015   Other Topics Concern  . Not on file   Social History Narrative   Divorced. Lives at home with herself.   Caffeine use: 1 cup coffee 3 times per week   Current Outpatient Prescriptions on File Prior to Visit  Medication Sig Dispense Refill  . ALPRAZolam (XANAX) 0.5 MG tablet TAKE ONE TABLET BY MOUTH FOUR TIMES DAILY AS NEEDED FOR ANXIETY 120 tablet 3  . atorvastatin (LIPITOR) 10 MG tablet TAKE ONE TABLET BY MOUTH EVERY EVENING 90 tablet 3  . b complex vitamins tablet Take 1 tablet by mouth daily.      . baclofen (LIORESAL) 10 MG tablet Take 1 tablet (10 mg total) by mouth 3 (three) times daily. 30 each   . buPROPion (WELLBUTRIN XL) 300 MG 24 hr tablet TAKE ONE TABLET BY MOUTH EVERY DAY *NEEDS OFFICE VISIT FOR MORE REFILLS* 30 tablet 5  . BYDUREON 2 MG SRER INJECT SUB-Q WEEKLY AS DIRECTED 4 each 5  . Cetirizine HCl 10 MG CAPS Take 1 capsule (10 mg total) by mouth daily as needed. Take by mouth. (Patient taking differently: Take 10 mg by mouth every evening. Take by mouth.) 30 capsule   . Cholecalciferol (VITAMIN D3) 2000 UNITS TABS Take 100-2,000 Units by mouth daily.     [name] dronabinol (MARINOL) 2.5 MG capsule Take 2.5 mg by mouth daily as needed.  1  . glipiZIDE (GLUCOTROL) 5 MG tablet TAKE ONE TABLET EVERY MORNING 90 tablet 3  . GRALISE 600 MG TABS Take 1,800 mg by mouth every evening.     . metFORMIN (GLUCOPHAGE) 1000 MG tablet Take 1 tablet (1,000 mg total) by mouth 2 (two) times daily with a meal. 180 tablet 3  . metoCLOPramide (REGLAN) 5 MG tablet TAKE 1 TABLET 4 TIMES A DAY BEFORE MEALSAND AT BEDTIME 120 tablet 3  . morphine (MS CONTIN) 60 MG 12 hr tablet Take 60 mg by mouth every 12 (twelve) hours.    . multivitamin-iron-minerals-folic acid (CENTRUM) chewable tablet Chew 1 tablet by mouth daily.    [name] oxyCODONE (OXY IR/ROXICODONE) 5 MG immediate release tablet Take 1 tablet (5 mg total)  by mouth every 4 (four) hours as needed for severe pain. Per pain clinic Dr Hardin Negus 30 tablet   . polyethylene glycol (MIRALAX / GLYCOLAX) packet Take 17 g by mouth daily.     [name] POTASSIUM CHLORIDE PO Take 198 mg by mouth daily.    . predniSONE (DELTASONE) 10 MG tablet Take 3 pills once daily by mouth for 3 days, then 2 pills once daily for 3 days, then 1 pill once daily for 3 days and then stop 18 tablet 0  . promethazine (PHENERGAN) 25 MG tablet Take 1 tablet (  25 mg total) by mouth every 8 (eight) hours as needed for nausea or vomiting (migraine). Caution of sedation 20 tablet 0  . sertraline (ZOLOFT) 50 MG tablet TAKE ONE TABLET BY MOUTH EVERY DAY 30 tablet 3  . traZODone (DESYREL) 100 MG tablet TAKE 1 TABLET BY MOUTH AT BEDTIME AS NEEDED FOR SLEEP 30 tablet 3  . valACYclovir (VALTREX) 1000 MG tablet Take 1 tablet (1,000 mg total) by mouth daily as needed. Must estab w/new provider for future refills 60 tablet 2  . DUEXIS 800-26.6 MG TABS Take 1 tablet by mouth 2 (two) times daily.  3   No current facility-administered medications on file prior to visit.    Allergies  Allergen Reactions  . Latex Hives  . Prednisone Other (See Comments)    hallucination   Family History  Problem Relation Age of Onset  . Adopted: Yes   PE: BP 124/80 (BP Location: Left Arm, Patient Position: Sitting)   Pulse 96   Ht 5\' 6"  (1.676 m)   Wt 234 lb (106.1 kg)   SpO2 97%   BMI 37.77 kg/m  Wt Readings from Last 3 Encounters:  05/30/17 234 lb (106.1 kg)  05/26/17 237 lb 12 oz (107.8 kg)  05/17/17 237 lb (107.5 kg)   Constitutional: obese, in NAD Eyes: PERRLA, EOMI, no exophthalmos ENT: moist mucous membranes, no thyromegaly, no cervical lymphadenopathy Cardiovascular: tachycardia, RR, No MRG Respiratory: CTA B Gastrointestinal: abdomen soft, NT, ND, BS+ Musculoskeletal: no deformities, strength intact in all 4 Skin: moist, warm, no rashes Neurological: no tremor with outstretched hands, DTR normal  in all 4  ASSESSMENT: 1. DM2, non-insulin-dependent, uncontrolled, without long term complications, but with hyperglycemia  PLAN:  1. Patient with long-standing, uncontrolled diabetes, on oral antidiabetic regimen + GLP1 R agonist, which became insufficient. She checks sugars seldom and only in am >> all high. We discussed the need to check more often. - we discussed at length about nutrition and I suggested to stop eating after dinner - at 7 pm as now she has many snacks after dinner. I also suggested other changes in her diet also.  - for now, we also discussed about adding a n SGLT2 inh to help with both sugars and weight, but I would like to de-escalate the regimen at next visit if she is doing better with her diet. - we discussed about SEs of Jardiance, which are: dizziness (advised to be careful when stands from sitting position), decreased BP - usually not < normal (BP today is not low), and fungal UTIs (advised to let me know if develops one).  - I suggested to:  Patient Instructions  Please continue: - Metformin 1000 mg 2x a day, with meals - Glipizide 5 mg in am, before b'fast - Bydureon 2 mg weekly  Please start: - Jardiance 25 mg daily in am, before b'fast  Please return in 1.5 months with your sugar log.   Please let me know if the sugars are consistently <80 or >200.  - Strongly advised her to start checking sugars at different times of the day - check once a day, rotating checks - given sugar log and advised how to fill it and to bring it at next appt  - given foot care handout and explained the principles  - given instructions for hypoglycemia management "15-15 rule"  - advised for yearly eye exams  - she got her flu shot this season - Return to clinic in 1.5 mo with sugar log  Philemon Kingdom, MD PhD [facility]

## 2017-06-01 ENCOUNTER — Other Ambulatory Visit: Payer: Self-pay | Admitting: Family Medicine

## 2017-06-21 ENCOUNTER — Telehealth: Payer: Self-pay

## 2017-06-21 ENCOUNTER — Ambulatory Visit (INDEPENDENT_AMBULATORY_CARE_PROVIDER_SITE_OTHER): Payer: Medicare Other | Admitting: Neurology

## 2017-06-21 ENCOUNTER — Other Ambulatory Visit: Payer: Self-pay | Admitting: Neurology

## 2017-06-21 ENCOUNTER — Encounter: Payer: Self-pay | Admitting: Neurology

## 2017-06-21 VITALS — BP 119/75 | HR 108 | Wt 236.4 lb

## 2017-06-21 DIAGNOSIS — G43901 Migraine, unspecified, not intractable, with status migrainosus: Secondary | ICD-10-CM

## 2017-06-21 DIAGNOSIS — E113292 Type 2 diabetes mellitus with mild nonproliferative diabetic retinopathy without macular edema, left eye: Secondary | ICD-10-CM | POA: Diagnosis not present

## 2017-06-21 LAB — HM DIABETES EYE EXAM

## 2017-06-21 MED ORDER — NARATRIPTAN HCL 2.5 MG PO TABS: ORAL_TABLET | ORAL | 11 refills | Status: DC

## 2017-06-21 MED ORDER — FROVATRIPTAN SUCCINATE 2.5 MG PO TABS: ORAL_TABLET | ORAL | 3 refills | Status: DC

## 2017-06-21 MED ORDER — DIHYDROERGOTAMINE MESYLATE 1 MG/ML IJ SOLN
1.0000 mg | Freq: Once | INTRAMUSCULAR | Status: AC
  Administered 2017-06-21: 1 mg via INTRAMUSCULAR

## 2017-06-21 NOTE — Addendum Note (Signed)
Addended by: Sarina Ill B on: 06/21/2017 03:55 PM   Modules accepted: Orders

## 2017-06-21 NOTE — Progress Notes (Signed)
[id number]    Provider:  Dr Lucia Gaskins Referring Provider: Dianne Dun, MD Primary Care Physician:  Dianne Dun, MD  CC: Thalamic pain syndrome  Interval headache 06/21/2017: She ha had a migraine since the middle of September. Prednisone taper did not work. Headaches are so bad she cant get anything, she has light and sound sensitivity, nausea. Patient has a history of migraines in the past however states she really doesn't have a lot of headaches that was remote and possibly once unfortunately she doesn't know her family history since she is adopted. Headache has been ongoing since September 13, this is a new problem. Never discussed this before, but pounding, very sensitive to the touch on the right side, unilateral, pain behind her eyes, nausea, waxing and waning but continuous and September 13 today it is 6 out of 10 in pain.  Interval history 08/17/2016: Patient returns for follow up of thalamic pain syndrome. Extensive workup was completed including thorough labwork, LP, repeat MRis. Left-sided symptoms from thalamic pain syndrome and she follows with pain management. She does have multiple neurologic other neurologic and somatic complaints. She is here for falls, she tore her left meniscus. She is having falls, started in September she had one in September and in novemebr she has little ones almost every day. No dizziness, SOB, cp, she had a meniscal tear in February and she reinjured the knee but this is not the only reason she falls. All of a sudden she is on the ground, no alteration of awareness however. The last fall she was reaching for something she was reaching over for something and lost her balance and catches herself. No associated dizziness, SOB or other associated symptoms just lost her balance. She has ringing in her ears, murmurs in her ear, she can hear the electricity when she plugs things into the wall she can hear the electricity, no headaches. She has  new weakness in her hands, she was diagnosed with CTS and she has swelling in the hands and a little tremor and can;t make a fist. Both hands. Her has a lot of pain in the bones of her hands. Will order physical therapy for gaot and balance and right lower back pain.   HPI: Amanda Vasquez is a 46 y.o. female here as a follow up. We have completed a thorough evaluation including images, lumbar puncture and labwork. MRi of the brain was repeated and showed T2/flair hyperintense focus in the deep white matter of the right hemisphere that extends to the left hypothalmus. Explained that thalamic pain syndrome could explain her left-sided symptoms. LP was negative for multiple sclerosis. Oligoclonal bands were seen in the serum and are a non-specific finding and may be caused by a wide range of disorders including infection, inflammation and neoplasia. Patients with at least 2 clones in the csf and serum likely systemic only. Identical csf bands in the CSF and serum likely systemic immune activation but not of intrathecal synthesis (ocbs in the csf likely explained by passive movement from the serum). At patient's request, screened for systemic inflammatory causes which came back unremarkable. MRi of the cervical spine showed very mild degenerative changes. EMG/NCS of the upper extremity showed right>left CTS which can be contributing to her symptoms. She has been referred to hand surgery.   Today she has severe pain on the left, fasciculations, knots and bumps everywhere, it hurts to extend her elbow on the right. She is hypersensitive, even water on her skin or  sheets on her skin on the left side hurts. She is in excruciating pain, even hurts to have her glasses on the left. She can't feel things as well on the left. The fine motor is diminished on the left. She did trigger point injections with Dr. Hardin Negus. She has a cyst on her right foot. She had an ovarian cyst that went away. She has extreme sensitivity  and when she is pushing a cart, the vibration hurts her left hand. Her balance is poor, not what it used to be. She has had nausea and vomiting over the last few months. She has to lay in bed in the mornings, she gets very stiff even sitting around at the house. She can't sit or she has extreme pain on both sides of her body and rectal pain. She denies low back pain or shooting pain in her legs. Mother said She stutters when she is in pain and can't get her point. She gets dizzy when she stands up too quickly. If she forgets her medicine, she wakes up with excruciating pain.   EMG/NCS bilateral uppers: 02/2015 Conclusion: This is an abnormal study. There is electrophysiologic evidence of bilateral, moderately-severe, Right >Left Carpal Tunnel Syndrome. No suggestion of polyneuropathy or radiculopathy. Clinical correlation recommended.   MRi brain and cervical cord 01/2015;  IMPRESSION: This is a mildly abnormal MRI of the cervical spine showing mild degenerative changes at C3-C4 and C5-C6 that do not lead to any nerve root impingement. The spinal cord appears normal.  IMPRESSION: This MRI of the brain with and without contrast shows a T2/flair hyperintense focus in the deep white matter of the right hemisphere corresponding to the focus reported in 05/13/2010 thatt no longer shows hyperintensity on diffusion-weighted images. Additionally, there are about 5 other punctate T2/flair foci in the subcortical white matter, most of which were present on the prior MRI. These foci are nonspecific and could be due to chronic microvascular ischemic changes, migraine or cardio-emboli. Demyelination would be less likely to have this appearance. No acute findings are noted.  Echo: 02/2015: normal  Labs: ace, hiv, b12, folate, pan-anca, rpr, hep c, celiac, ana comprehensive panel, paraneoplastic panel, IFE, tsh, RF,cmp,cbc, esr, hgba1c, lipids were unremarkable - slightly elevated hgba1c at 7 and esr 31.    LP: protein, glucose, cell count and diff, ocbs, ms profile unremarkable   Initial visit 02/02/2015: Amanda Vasquez is a 46 y.o. female here as a referral from Dr. Asa Lente for complicated symptoms. Former Therapist, sports. Sounds like she has had a long journey and has been evaluated by multiple neurologists, pain specialists and had many pain procedures all of which have failed to find an etiology for her symptoms. One of her complaints is right arm pain. She has some shooting pain from the right side of the neck to the hand which started in November and have worsened. Goes into all 5 fingers. Feels like her whole hand is burning, like it is going to "shoot off", she can't make a fist, worse at night, she gets swollen lumps in her right upper arm. She can push on the base of her neck and actually cause the pain to shoot down her arm. She also has longer-standing pain symptoms on the entire left side of the body, splits midline to the left almost exactly from head to toe, with sensitivity of skin. She has left-sided weakness. She has fatigue. She went to [facility] for MRIs and follow up in 2011 and 2012 for abnormal white matter  changes. She also saw Dr. Krista Blue in 2011 and 2012. She has been evaluated by multiple neurologists for the same reasons, last 4 years ago as reported by patient. She was following at [facility] with Dr. Moshe Cipro. Has not had an MRi since 2012. No vision changes, has ringing and buzzing in her ears. Left sided issues started in 2011 when she was originally evaluated and diagnosed with a "demyelinating disorder". The left-sided symptoms are worsening. Cold makes her left-sided symptoms worse, she can't get out of bed in the winter. She is on pain medications, the gralise and oxycodone makes her symptoms better. She takes morphine ER. She is on disability due to her condition and follows in pain clinic. She has also seen Dr. Krista Blue here at [facility]. She was diagnosed with RSD in the left foot and arm at some point and  saw Dr. Nelva Bush in 2012 who tried doing multiple cervical and lumbar spinal blocks which did not help. She was also seen at [place] [facility] and they did a spinal nerve ablation which did not work longer than a day. By 3-4pm she can't even hold up her head anymore, she has to prop it up against something because of tightness. She tries to get out more now since it is warmer, it takes her 45 minutes to get up and out of the bed in the mornings. In the cold weather she is bed ridden. She was adopted and she does not have infomration about birth family.   Reviewed notes, labs and imaging from outside physicians, which showed: MRI of the brain dated June 2012 showed a single nonenhancing 5 x 3 mm focus of increased T2 FLAIR signal intensity within the sub-occipital white matter of the right parietal lobe. No enhancing lesions.MRI of the cervical spine showed no disc protrusion, spinal canal or neuroforaminal stenosis at any level. Both were done with and without contrast.  She sees Dr. Unice Cobble who is her primary care. He also notes this reported "demyelinating syndrome" per patient. She was seen at [facility] by Dr. Moshe Cipro and I [facility] neurology by Dr. Benjamin Stain. She was last seen at [facility] in July 2013 and apparently she became upset when MS treatments were not pursued. A hand surgeon diagnosed reflex sympathetic dystrophy . She follows in pain clinic.  Review of Systems: Patient complains of symptoms per HPI as well as the following symptoms: Fatigue, fever, unexpected weight change, excessive sweating, ringing in ears, light sensitivity, cold intolerance, heat intolerance, flushing, abdominal pain, constipation, nausea, environmental allergies, joint pain, joint swelling, back pain, aching muscles, muscle cramps, walking difficulty, neck pain, swollen lymph nodes, bruise bleed easily, memory loss, dizziness, burning, speech difficulty, weakness, tremors, decreased concentration, depression,  nervousness and anxiety Pertinent negatives per HPI. All others negative.  Social History   Social History  . Marital status: Divorced    Spouse name: N/A  . Number of children: 0  . Years of education: 12+   Occupational History  . RN- long-term disability     Social History Main Topics  . Smoking status: Former Smoker    Packs/day: 0.25    Years: 20.00    Types: Cigarettes    Start date: 06/08/1986    Quit date: 08/22/2010  . Smokeless tobacco: Never Used  . Alcohol use No  . Drug use: No  . Sexual activity: Yes    Birth control/ protection: IUD     Comment: Mirena IUD placement Dec 2015   Other Topics Concern  .  Not on file   Social History Narrative   Divorced. Lives at home alone   Caffeine use: 1 cup coffee 3 times per week   Right handed    Family History  Problem Relation Age of Onset  . Adopted: Yes    Past Medical History:  Diagnosis Date  . Acute meniscal tear of left knee   . Allergic rhinitis   . Chronic constipation   . Demyelinating disorder ([place])    brain lesion that touches thalamic  . Diabetes mellitus, type II ([place])    controlled with medication;  . Dyslipidemia   . General weakness    left hand and leg  . Generalized anxiety disorder    mostly controlled  . History of adenomatous polyp of colon   . History of colitis   . Knee pain   . Migraine   . 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 Wears glasses     Past Surgical History:  Procedure Laterality Date  . BREAST REDUCTION SURGERY Bilateral 1997  . CHOLECYSTECTOMY  1998  . KNEE ARTHROSCOPY Right 1993  . KNEE ARTHROSCOPY WITH MEDIAL MENISECTOMY Left 10/09/2015   Procedure: LEFT KNEE ARTHROSCOPY WITH PARTIAL MEDIAL MENISCECTOMY; PATELLA-FEMORAL CHONDROPLASTY;  Surgeon:  Rod Can, MD;  Location: [facility];  Service: Orthopedics;  Laterality: Left;  . TRANSTHORACIC ECHOCARDIOGRAM  03-02-2015   normal LVF,  ef 65-70%    Current Outpatient Prescriptions  Medication Sig Dispense Refill  . ALPRAZolam (XANAX) 0.5 MG tablet TAKE ONE TABLET BY MOUTH FOUR TIMES DAILY AS NEEDED FOR ANXIETY 120 tablet 3  . atorvastatin (LIPITOR) 10 MG tablet TAKE ONE TABLET BY MOUTH EVERY EVENING 90 tablet 3  . b complex vitamins tablet Take 1 tablet by mouth daily.      . baclofen (LIORESAL) 10 MG tablet Take 1 tablet (10 mg total) by mouth 3 (three) times daily. 30 each   . buPROPion (WELLBUTRIN XL) 300 MG 24 hr tablet TAKE ONE TABLET BY MOUTH EVERY DAY *NEEDS OFFICE VISIT FOR MORE REFILLS* 30 tablet 5  . BYDUREON 2 MG SRER INJECT SUB-Q WEEKLY AS DIRECTED 4 each 5  . Cetirizine HCl 10 MG CAPS Take 1 capsule (10 mg total) by mouth daily as needed. Take by mouth. (Patient taking differently: Take 10 mg by mouth every evening. Take by mouth.) 30 capsule   . Cholecalciferol (VITAMIN D3) 2000 UNITS TABS Take 100-2,000 Units by mouth daily.     [name] dronabinol (MARINOL) 2.5 MG capsule Take 2.5 mg by mouth daily as needed.  1  . DUEXIS 800-26.6 MG TABS Take 1 tablet by mouth 2 (two) times daily.  3  . empagliflozin (JARDIANCE) 25 MG TABS tablet Take 25 mg by mouth daily. 30 tablet 5  . glipiZIDE (GLUCOTROL) 5 MG tablet TAKE ONE TABLET EVERY MORNING 90 tablet 3  . GRALISE 600 MG TABS Take 1,800 mg by mouth every evening.     . metFORMIN (GLUCOPHAGE) 1000 MG tablet TAKE 1 TABLET BY MOUTH TWICE DAILY WITH A MEAL 180 tablet 3  . metoCLOPramide (REGLAN) 5 MG tablet TAKE 1 TABLET 4 TIMES A DAY BEFORE MEALSAND AT BEDTIME 120 tablet 3  . morphine (MS CONTIN) 60 MG 12 hr tablet Take 60 mg by mouth every 12 (twelve) hours.    [name]  multivitamin-iron-minerals-folic acid (CENTRUM) chewable tablet Chew 1 tablet by mouth daily.    [name] oxyCODONE (OXY IR/ROXICODONE) 5 MG immediate release  tablet Take 1 tablet (5 mg total) by mouth every 4 (four) hours as needed for severe pain. Per pain clinic Dr Hardin Negus 30 tablet   . polyethylene glycol (MIRALAX / GLYCOLAX) packet Take 17 g by mouth daily.     [name] POTASSIUM CHLORIDE PO Take 198 mg by mouth daily.    . promethazine (PHENERGAN) 25 MG tablet Take 1 tablet (25 mg total) by mouth every 8 (eight) hours as needed for nausea or vomiting (migraine). Caution of sedation 20 tablet 0  . sertraline (ZOLOFT) 50 MG tablet TAKE ONE TABLET BY MOUTH EVERY DAY 30 tablet 3  . traZODone (DESYREL) 100 MG tablet TAKE 1 TABLET BY MOUTH AT BEDTIME AS NEEDED FOR SLEEP 30 tablet 3  . valACYclovir (VALTREX) 1000 MG tablet Take 1 tablet (1,000 mg total) by mouth daily as needed. Must estab w/new provider for future refills 60 tablet 2   No current facility-administered medications for this visit.     Allergies as of 06/21/2017 - Review Complete 06/21/2017  Allergen Reaction Noted  . Latex Hives 07/03/2008    Vitals: BP 119/75   Pulse (!) 108   Wt 236 lb 6.4 oz (107.2 kg)   BMI 38.16 kg/m  Last Weight:  Wt Readings from Last 1 Encounters:  06/21/17 236 lb 6.4 oz (107.2 kg)   Last Height:   Ht Readings from Last 1 Encounters:  05/30/17 '5\' 6"'$  (1.676 m)     Motor Observation:  No asymmetry, no atrophy, and no involuntary movements noted. Tone:  Normal muscle tone.   Posture:  Posture is normal. normal erect   Gait: can heel and toe walk, imbalance with heel-to-toe but can perform, sway on romberg. Wide based due to body habitus  Strength: left prox weakness with some giveway otherwise  Strength is V/V in the upper and lower limbs.    Sensation: Splits midline from the face to the thorax/abd/pelvis with hyperalgesia and dysesthesias on the left including left arm and leg.    reflexes: decrease left patellar otherwise normal   Assessment/Plan: 46 year old patient with a complicated past medical history which  includes thalamic pain syndrome, on disability,  chronic fatigue and chronic pain syndrome. We have completed a thorough evaluation including images, lumbar puncture and labwork. MRi of the brain was repeated and showed T2/flair hyperintense focus in the deep white matter of the right hemisphere that extends to the left hypothalmus. Explained that thalamic pain syndrome could explain her left-sided symptoms. LP was negative for multiple sclerosis. Oligoclonal bands were seen in the serum and are a non-specific finding and may be caused by a wide range of disorders including infection, inflammation and neoplasia. Patients with at least 2 clones in the csf and serum likely systemic only. Identical csf bands in the CSF and serum likely systemic immune activation but not of intrathecal synthesis (ocbs in the csf likely explained by passive movement from the serum). At patient's request, screened for systemic inflammatory causes which came back unremarkable. MRi of the cervical spine showed very mild degenerative changes. EMG/NCS of the upper extremity showed right>left CTS which can be contributing to her symptoms. She has been referred to hand surgery.   Problem migraines: Today patient presents in status migrainosus has had a migraine since September 13, she's been treated by multiple physicians without relief, discussed migraines, migraine management, acute management of migraines  and prevention of migraines. Today we'll try a migraine cocktail with Depakote, steroids, Toradol and fluids and if this doesn't work we'll try nerve blocks are spheno-.  Discussed at length with patient. At this point she should f/u with hand surgery and her pain managagement specialist and continue to followup with her therapist.   Right low back pain without sciatica for one year, imbalance and recent falls: Physicalt therapy  Sarina Ill, MD  [facility] [address] [place], [place]  [address]  Phone [phone] Fax [phone]  A total of 15 minutes was spent face-to-face with this patient. Over half this time was spent on counseling patient on the status migrainosus diagnosis and different diagnostic and therapeutic options available.

## 2017-06-21 NOTE — Telephone Encounter (Signed)
Called in Naratriptan, lets see how that goes thanks

## 2017-06-21 NOTE — Addendum Note (Signed)
Addended by: Gildardo Griffes on: 06/21/2017 03:45 PM   Modules accepted: Orders

## 2017-06-21 NOTE — Progress Notes (Addendum)
Nerve block:  9 mL 0.5 % Bupivacaine LOT: [id number] EXP: 10/2018  9 mL 2% Xlocaine LOT: [id number] EXP: 02/22  //BCrn  15:25 Administered Dihydroergotamine 0.5 mg IM in R deltoid. Tolerated well. Bandaid applied.  Pain much better after first 1/2 of dose.  15:45 Administered remaining 0.5 mg Dihydroergotamine in L deltoid. Tolerated well. Bandaid applied.

## 2017-06-21 NOTE — Telephone Encounter (Signed)
Patient was seen today and states that a new medication was sent to her pharmacy, she could not remember the name, but says that her copay is too much and she cannot afford it. She is wondering if something else can be prescribed?

## 2017-06-22 NOTE — Telephone Encounter (Signed)
Called and spoke with pt. She verbalized understanding of the new prescription for Naratriptan which she has already picked up from the pharmacy. She verbalized understanding of the instructions, taking 1 pill every day for 4 days. She has refills. She had no questions/concerns.

## 2017-06-27 ENCOUNTER — Telehealth: Payer: Self-pay | Admitting: *Deleted

## 2017-06-27 ENCOUNTER — Other Ambulatory Visit: Payer: Self-pay

## 2017-06-27 MED ORDER — FLUCONAZOLE 150 MG PO TABS
150.0000 mg | ORAL_TABLET | Freq: Every day | ORAL | 1 refills | Status: DC
Start: 2017-06-27 — End: 2017-07-24

## 2017-06-27 NOTE — Telephone Encounter (Signed)
Let's send Diflucan 150 mg 1 tab with 1 refill.

## 2017-06-27 NOTE — Telephone Encounter (Signed)
Submitted

## 2017-06-27 NOTE — Telephone Encounter (Signed)
Patient called states that Dr. Cruzita Lederer prescribed her a medication that is causing her to have a yeast infection. Patient states she talked to Dr. Cruzita Lederer and was told if it did to call and she would call her in something for it. Patient needs something call in for her yeast infection. Patient pharmacy is [facility]. Please advise. Thank you

## 2017-06-27 NOTE — Telephone Encounter (Signed)
Please advise. Thank you

## 2017-07-03 ENCOUNTER — Telehealth: Payer: Self-pay

## 2017-07-03 NOTE — Telephone Encounter (Signed)
I received an prior auth request for naratriptan. I have completed and submitted the PA and should have a determination within 48-72 hours.

## 2017-07-04 ENCOUNTER — Telehealth: Payer: Self-pay | Admitting: *Deleted

## 2017-07-04 NOTE — Telephone Encounter (Signed)
Received fax from [employer] that Naratriptan is on the plan's list of covered drugs. No prior authorization is required at this time.

## 2017-07-12 ENCOUNTER — Other Ambulatory Visit: Payer: Self-pay | Admitting: Family Medicine

## 2017-07-17 ENCOUNTER — Other Ambulatory Visit: Payer: Self-pay

## 2017-07-17 MED ORDER — ALPRAZOLAM 0.5 MG PO TABS: ORAL_TABLET | ORAL | 0 refills | Status: DC

## 2017-07-17 NOTE — Progress Notes (Signed)
Reprinted per TA/thx dmf

## 2017-07-17 NOTE — Telephone Encounter (Signed)
Faxed to pharmacy/thx dmf

## 2017-07-21 ENCOUNTER — Ambulatory Visit: Payer: Medicare Other | Admitting: Internal Medicine

## 2017-07-24 ENCOUNTER — Encounter: Payer: Self-pay | Admitting: Internal Medicine

## 2017-07-24 ENCOUNTER — Ambulatory Visit: Payer: Medicare Other | Admitting: Internal Medicine

## 2017-07-24 VITALS — BP 138/80 | HR 101 | Wt 224.0 lb

## 2017-07-24 DIAGNOSIS — E1165 Type 2 diabetes mellitus with hyperglycemia: Secondary | ICD-10-CM | POA: Diagnosis not present

## 2017-07-24 DIAGNOSIS — Z6836 Body mass index (BMI) 36.0-36.9, adult: Secondary | ICD-10-CM

## 2017-07-24 DIAGNOSIS — E669 Obesity, unspecified: Secondary | ICD-10-CM | POA: Diagnosis not present

## 2017-07-24 MED ORDER — EMPAGLIFLOZIN 25 MG PO TABS: 25.0000 mg | ORAL_TABLET | Freq: Every day | ORAL | 5 refills | Status: DC

## 2017-07-24 NOTE — Progress Notes (Signed)
Patient ID: Amanda Vasquez, female   DOB: May 02, 1971, 46 y.o.   MRN: [id number]   HPI: Amanda Vasquez is a 46 y.o.-year-old female, returning for follow-up for DM2, dx in ~1997, non-insulin-dependent, uncontrolled, with complications (mild nonproliferative DR).  Last visit 2 months ago.  Last hemoglobin A1c: Lab Results  Component Value Date   HGBA1C 8.9 05/30/2017   HGBA1C 9.7 (H) 01/19/2017   HGBA1C 7.3 (H) 06/22/2016  She has migraines >> steroids tapers.   Pt is on a regimen of: - Metformin 1000 mg 2x a day, with meals - Glipizide 5 mg in am - Bydureon 2 mg weekly - since 2017 >> ran out as in the gap - Jardiance 25 mg daily - added 05/2017.  She had yeast infections, resolved she stays well-hydrated.  Pt checks her sugars 1x a day: - am: 150-200s >> 90-120 - 2h after b'fast: n/c - before lunch: n/c - 2h after lunch: n/c >> 130-140 - before dinner: n/c - 2h after dinner: n/c - bedtime: n/c - nighttime: n/c Lowest sugar was 90-120 >> 64 x1 (fasting); she has hypoglycemia awareness in the 90s..  Highest sugar was 200s >> 154.  Glucometer: AccuChek  Pt's meals are: - Breakfast: hot tea, toast  - Lunch: ham and PB sandwich - Dinner: soup , salad, sometimes chicken - Snacks: muffin; no sodas  -No CKD, last BUN/creatinine:  Lab Results  Component Value Date   BUN 14 01/19/2017   BUN 15 03/10/2016   CREATININE 0.60 05/25/2017   CREATININE 0.80 01/19/2017   -+ Hyperlipidemia; last set of lipids: Lab Results  Component Value Date   CHOL 166 01/19/2017   HDL 56.60 01/19/2017   LDLCALC 71 01/19/2017   LDLDIRECT 138.8 06/22/2011   TRIG 191.0 (H) 01/19/2017   CHOLHDL 3 01/19/2017  On Lipitor 10 mg daily. - last eye exam was on 06/21/2017: She had+ DR OS (mild NP DR), improved on last check. -Denies numbness and tingling in her feet.  Unclear  FH of DM  - pt adopted.  She stopped Frovo-migraine medicine, per neurology.  ROS: Constitutional: + weight loss, no  fatigue, + subjective hyperthermia, no subjective hypothermia Eyes: no blurry vision, no xerophthalmia ENT: no sore throat, no nodules palpated in throat, no dysphagia, no odynophagia, no hoarseness Cardiovascular: no CP/no SOB/no palpitations/no leg swelling Respiratory: no cough/no SOB/no wheezing Gastrointestinal: no N/no V/+ D/no C/no acid reflux Musculoskeletal: no muscle aches/no joint aches Skin: no rashes, no hair loss Neurological: no tremors/no numbness/no tingling/no dizziness, + HA  I reviewed pt's medications, allergies, PMH, social hx, family hx, and changes were documented in the history of present illness. Otherwise, unchanged from my initial visit note.  Past Medical History:  Diagnosis Date  . Acute meniscal tear of left knee   . Allergic rhinitis   . Chronic constipation   . Demyelinating disorder ([place])    brain lesion that touches thalamic  . Diabetes mellitus, type II ([place])    controlled with medication;  . Dyslipidemia   . General weakness    left hand and leg  . Generalized anxiety disorder    mostly controlled  . History of adenomatous polyp of colon   . History of colitis   . Knee pain   . Migraine   . 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 Wears glasses    Past Surgical History:  Procedure Laterality Date  . BREAST REDUCTION SURGERY Bilateral 1997  . CHOLECYSTECTOMY  1998  . KNEE ARTHROSCOPY Right 1993  . KNEE ARTHROSCOPY WITH MEDIAL MENISECTOMY Left 10/09/2015   Procedure: LEFT KNEE ARTHROSCOPY WITH PARTIAL MEDIAL MENISCECTOMY; PATELLA-FEMORAL CHONDROPLASTY;  Surgeon: Rod Can, MD;  Location: [facility];  Service: Orthopedics;  Laterality: Left;  . TRANSTHORACIC ECHOCARDIOGRAM  03-02-2015   normal LVF,  ef 65-70%    Social History   Socioeconomic History  . Marital status: Divorced    Spouse name: Not on file  . Number of children: 0  . Years of education: 12+  . Highest education level: Not on file  Social Needs  . Financial resource strain: Not on file  . Food insecurity - worry: Not on file  . Food insecurity - inability: Not on file  . Transportation needs - medical: Not on file  . Transportation needs - non-medical: Not on file  Occupational History  . Occupation: Therapist, sports- long-term disability   Tobacco Use  . Smoking status: Former Smoker    Packs/day: 0.25    Years: 20.00    Pack years: 5.00    Types: Cigarettes    Start date: 06/08/1986    Last attempt to quit: 08/22/2010    Years since quitting: 6.9  . Smokeless tobacco: Never Used  Substance and Sexual Activity  . Alcohol use: No    Alcohol/week: 0.0 oz  . Drug use: No  . Sexual activity: Yes    Birth control/protection: IUD    Comment: Mirena IUD placement Dec 2015  Other Topics Concern  . Not on file  Social History Narrative   Divorced. Lives at home alone   Caffeine use: 1 cup coffee 3 times per week   Right handed   Current Outpatient Medications on File Prior to Visit  Medication Sig Dispense Refill  . ALPRAZolam (XANAX) 0.5 MG tablet TAKE 1 TABLET BY MOUTH 4 TIMES DAILY AS NEEDED FOR ANXIETY 120 tablet 0  . atorvastatin (LIPITOR) 10 MG tablet TAKE ONE TABLET BY MOUTH EVERY EVENING 90 tablet 3  . b complex vitamins tablet Take 1 tablet by mouth daily.      . baclofen (LIORESAL) 10 MG tablet Take 1 tablet (10 mg total) by mouth 3 (three) times daily. 30 each   . buPROPion (WELLBUTRIN XL) 300 MG 24 hr tablet TAKE ONE TABLET BY MOUTH EVERY DAY *NEEDS OFFICE VISIT FOR MORE REFILLS* 30 tablet 5  . BYDUREON 2 MG SRER INJECT SUB-Q WEEKLY AS DIRECTED 4 each 5  . Cetirizine HCl 10 MG CAPS Take 1 capsule (10 mg total) by mouth daily as needed. Take by mouth. (Patient taking differently: Take 10 mg by mouth every evening. Take  by mouth.) 30 capsule   . Cholecalciferol (VITAMIN D3) 2000 UNITS TABS Take 100-2,000 Units by mouth daily.     [name] dronabinol (MARINOL) 2.5 MG capsule Take 2.5 mg by mouth daily as needed.  1  . DUEXIS 800-26.6 MG TABS Take 1 tablet by mouth 2 (two) times daily.  3  . empagliflozin (JARDIANCE) 25 MG TABS tablet Take 25 mg by mouth daily. 30 tablet 5  . fluconazole (DIFLUCAN) 150 MG tablet Take 1 tablet (150 mg total) daily by mouth. 1 tablet 1  . frovatriptan (FROVA) 2.5 MG tablet Take one daily for 3-4 days 4 tablet 3  . glipiZIDE (GLUCOTROL) 5 MG tablet  TAKE ONE TABLET EVERY MORNING 90 tablet 3  . GRALISE 600 MG TABS Take 1,800 mg by mouth every evening.     . metFORMIN (GLUCOPHAGE) 1000 MG tablet TAKE 1 TABLET BY MOUTH TWICE DAILY WITH A MEAL 180 tablet 3  . metoCLOPramide (REGLAN) 5 MG tablet TAKE 1 TABLET 4 TIMES A DAY BEFORE MEALSAND AT BEDTIME 120 tablet 3  . morphine (MS CONTIN) 60 MG 12 hr tablet Take 60 mg by mouth every 12 (twelve) hours.    . multivitamin-iron-minerals-folic acid (CENTRUM) chewable tablet Chew 1 tablet by mouth daily.    . naratriptan (AMERGE) 2.5 MG tablet Take one daily for 4 days. 4 tablet 11  . oxyCODONE (OXY IR/ROXICODONE) 5 MG immediate release tablet Take 1 tablet (5 mg total) by mouth every 4 (four) hours as needed for severe pain. Per pain clinic Dr Hardin Negus 30 tablet   . polyethylene glycol (MIRALAX / GLYCOLAX) packet Take 17 g by mouth daily.     [name] POTASSIUM CHLORIDE PO Take 198 mg by mouth daily.    . promethazine (PHENERGAN) 25 MG tablet Take 1 tablet (25 mg total) by mouth every 8 (eight) hours as needed for nausea or vomiting (migraine). Caution of sedation 20 tablet 0  . sertraline (ZOLOFT) 50 MG tablet TAKE ONE TABLET BY MOUTH EVERY DAY 30 tablet 3  . traZODone (DESYREL) 100 MG tablet TAKE 1 TABLET BY MOUTH AT BEDTIME AS NEEDED FOR SLEEP 30 tablet 3  . valACYclovir (VALTREX) 1000 MG tablet Take 1 tablet (1,000 mg total) by mouth daily as needed. Must  estab w/new provider for future refills 60 tablet 2   No current facility-administered medications on file prior to visit.    Allergies  Allergen Reactions  . Latex Hives   Family History  Adopted: Yes   PE: BP 138/80   Pulse (!) 101   Wt 224 lb (101.6 kg)   SpO2 96%   BMI 36.15 kg/m  Wt Readings from Last 3 Encounters:  07/24/17 224 lb (101.6 kg)  06/21/17 236 lb 6.4 oz (107.2 kg)  05/30/17 234 lb (106.1 kg)   Constitutional: Obese, in NAD Eyes: PERRLA, EOMI, no exophthalmos ENT: moist mucous membranes, no thyromegaly, no cervical lymphadenopathy Cardiovascular: tachycardia, RR, No MRG Respiratory: CTA B Gastrointestinal: abdomen soft, NT, ND, BS+ Musculoskeletal: no deformities, strength intact in all 4 Skin: moist, warm, no rashes Neurological: no tremor with outstretched hands, DTR normal in all 4  ASSESSMENT: 1. DM2, non-insulin-dependent, uncontrolled, without long term complications, but with hyperglycemia  2.  Obesity  PLAN:  1. Patient with long-standing, uncontrolled diabetes, on oral antidiabetic regimen and GLP-1 receptor agonist, to which we added Jardiance at last visit.  She had a yeast infection since last visit, which we treated with Diflucan at the beginning of last month.  She did not have further yeast infections afterwards. - At last visit, we also discussed at length about nutrition and I suggested to stop eating after dinner, which is at 7 PM as she was having a lot of snacks in the evening before.  I also suggested other changes to her diet, also. - At this visit, the sugars are much better, although she needed to stop Bydureon due to insurance coverage.  She may be able to afford it again at the beginning of the year.  However for now, based on the sugar levels, she does not need it. - Reviewed latest HbA1c from 05/2017: High, at 8.9% - At this visit, -  I suggested to:  Patient Instructions  Please continue: - Metformin 1000 mg 2x a day, with  meals - Glipizide 5 mg in am, before b'fast - Jardiance 25 mg daily in a.m.  Please return in 2 months with your sugar log.   - continue checking sugars at different times of the day - check 1x a day, rotating checks - advised for yearly eye exams >> she is UTD - Return to clinic in 2 mo with sugar log   2. Obesity - Patient with significant weight loss of 12 pounds-after starting Jardiance.  We will continue the medication.  Unfortunately, she had to come off Bydureon, which was also helping with weight loss, but I am hoping that we can restart it at next visit.  Also, at next visit, I am hoping we can stop glipizide, which can also contribute to weight gain   - time spent with the patient: 25 min, of which >50% was spent in reviewing her sugars, discussing about further plan for treatment, reviewing  previous labs and insulin doses and also leaving her weight and developing a plan to improve it.   Philemon Kingdom, MD PhD [facility] Endocr[facility]

## 2017-07-24 NOTE — Patient Instructions (Signed)
Please continue: - Metformin 1000 mg 2x a day, with meals - Glipizide 5 mg in am, before b'fast - Jardiance 25 mg daily in a.m.  Please return in 2 months with your sugar log.

## 2017-07-25 LAB — BASIC METABOLIC PANEL WITH GFR
BUN: 13 mg/dL (ref 7–25)
CO2: 24 mmol/L (ref 20–32)
Calcium: 9.6 mg/dL (ref 8.6–10.2)
Chloride: 100 mmol/L (ref 98–110)
Creat: 0.87 mg/dL (ref 0.50–1.10)
GFR, Est African American: 92 mL/min/{1.73_m2} (ref >=60)
GFR, Est Non African American: 79 mL/min/{1.73_m2} (ref >=60)
Glucose, Bld: 125 mg/dL — ABNORMAL HIGH (ref 65–99)
Potassium: 4.3 mmol/L (ref 3.5–5.3)
Sodium: 135 mmol/L (ref 135–146)

## 2017-08-01 DIAGNOSIS — G894 Chronic pain syndrome: Secondary | ICD-10-CM | POA: Diagnosis not present

## 2017-08-01 DIAGNOSIS — Z79891 Long term (current) use of opiate analgesic: Secondary | ICD-10-CM | POA: Diagnosis not present

## 2017-08-01 DIAGNOSIS — R110 Nausea: Secondary | ICD-10-CM | POA: Diagnosis not present

## 2017-08-01 DIAGNOSIS — G890 Central pain syndrome: Secondary | ICD-10-CM | POA: Diagnosis not present

## 2017-08-16 ENCOUNTER — Other Ambulatory Visit: Payer: Self-pay | Admitting: Family Medicine

## 2017-08-17 NOTE — Telephone Encounter (Signed)
Per TA ok to call in Alprazolam for 30d+1 till OV in Feb/thx dmf

## 2017-08-31 ENCOUNTER — Telehealth: Payer: Self-pay | Admitting: Internal Medicine

## 2017-08-31 DIAGNOSIS — R110 Nausea: Secondary | ICD-10-CM | POA: Diagnosis not present

## 2017-08-31 DIAGNOSIS — G890 Central pain syndrome: Secondary | ICD-10-CM | POA: Diagnosis not present

## 2017-08-31 DIAGNOSIS — G894 Chronic pain syndrome: Secondary | ICD-10-CM | POA: Diagnosis not present

## 2017-08-31 DIAGNOSIS — Z79891 Long term (current) use of opiate analgesic: Secondary | ICD-10-CM | POA: Diagnosis not present

## 2017-08-31 MED ORDER — FLUCONAZOLE 150 MG PO TABS: 150.0000 mg | ORAL_TABLET | Freq: Once | ORAL | 1 refills | Status: AC

## 2017-08-31 NOTE — Telephone Encounter (Signed)
Pt stopped taking Bydeuron about 2 months ago. Since then her sugars have been okay until this week and her sugars have been in 200s daily. Highest was the 273, and lowest was 160. Pt stated that she feels nauseous first thing in the morning. Pt also has a yeast infection and would like something sent in for that. Please advise

## 2017-08-31 NOTE — Telephone Encounter (Signed)
Pt stated dr took her off a medication and her sugar is going up 273.  She is wanting to know if there is something we can do.  She stated she has been very nauseated  She also needs a script for a yeast infection    Please advise.

## 2017-08-31 NOTE — Telephone Encounter (Signed)
Patient is returning your call.  

## 2017-08-31 NOTE — Telephone Encounter (Signed)
LMTCB for more information 

## 2017-08-31 NOTE — Telephone Encounter (Signed)
Let's send a Diflucan 150 mg x 1 with 1 refill for the yeast infection. OK to skip next Bydureon dose to see if nausea is better. Can increase Glipizide in am to 10 mg for now and let us know about sugars and sxs in few days.

## 2017-08-31 NOTE — Telephone Encounter (Signed)
Pt is aware and she has stopped Bydeuron months ago but will increase the glipizide

## 2017-09-18 ENCOUNTER — Other Ambulatory Visit: Payer: Self-pay | Admitting: Family Medicine

## 2017-09-27 ENCOUNTER — Telehealth: Payer: Self-pay | Admitting: Internal Medicine

## 2017-09-27 MED ORDER — GLIPIZIDE 5 MG PO TABS: 5.0000 mg | ORAL_TABLET | Freq: Every morning | ORAL | 0 refills | Status: DC

## 2017-09-27 NOTE — Telephone Encounter (Signed)
Pt states that a script was sent in  glipiZIDE (GLUCOTROL) 5 MG tablet Dr has her taking 2 in the morning. The script was sent in for just 1 tablet every morning. Pt has enough for tomorrow morning then will be completely out    Pt need [place] to resend this script in for the correct amount. (2 each morning) '     [place], [place]

## 2017-09-27 NOTE — Telephone Encounter (Signed)
Need refill of glipizide 5 mg 2 tabs a day  Send to -[facility] in [place]

## 2017-09-27 NOTE — Telephone Encounter (Signed)
I am not seeing documented that pt was to take 2 pills daily, please advise

## 2017-09-27 NOTE — Telephone Encounter (Signed)
Refill sent.

## 2017-09-28 DIAGNOSIS — R110 Nausea: Secondary | ICD-10-CM | POA: Diagnosis not present

## 2017-09-28 DIAGNOSIS — G890 Central pain syndrome: Secondary | ICD-10-CM | POA: Diagnosis not present

## 2017-09-28 DIAGNOSIS — Z79891 Long term (current) use of opiate analgesic: Secondary | ICD-10-CM | POA: Diagnosis not present

## 2017-09-28 DIAGNOSIS — G894 Chronic pain syndrome: Secondary | ICD-10-CM | POA: Diagnosis not present

## 2017-09-28 MED ORDER — GLIPIZIDE 5 MG PO TABS: ORAL_TABLET | ORAL | 0 refills | Status: DC

## 2017-09-28 NOTE — Telephone Encounter (Signed)
Ok to send Rx for 2 tabs per day

## 2017-09-28 NOTE — Telephone Encounter (Signed)
Sent 2 tablets for pt to [facility]

## 2017-10-02 ENCOUNTER — Inpatient Hospital Stay
Admission: EM | Admit: 2017-10-02 | Discharge: 2017-10-03 | DRG: 176 | Disposition: A | Discharge status: Home / Self Care | Payer: Medicare Other | Attending: Internal Medicine | Admitting: Internal Medicine

## 2017-10-02 ENCOUNTER — Encounter: Payer: Self-pay | Admitting: Radiology

## 2017-10-02 ENCOUNTER — Emergency Department: Payer: Medicare Other

## 2017-10-02 ENCOUNTER — Other Ambulatory Visit: Payer: Self-pay

## 2017-10-02 DIAGNOSIS — F329 Major depressive disorder, single episode, unspecified: Secondary | ICD-10-CM | POA: Diagnosis present

## 2017-10-02 DIAGNOSIS — I2699 Other pulmonary embolism without acute cor pulmonale: Secondary | ICD-10-CM | POA: Diagnosis not present

## 2017-10-02 DIAGNOSIS — Z91040 Latex allergy status: Secondary | ICD-10-CM

## 2017-10-02 DIAGNOSIS — R0600 Dyspnea, unspecified: Secondary | ICD-10-CM | POA: Diagnosis not present

## 2017-10-02 DIAGNOSIS — G43909 Migraine, unspecified, not intractable, without status migrainosus: Secondary | ICD-10-CM | POA: Diagnosis not present

## 2017-10-02 DIAGNOSIS — G379 Demyelinating disease of central nervous system, unspecified: Secondary | ICD-10-CM | POA: Diagnosis not present

## 2017-10-02 DIAGNOSIS — E1165 Type 2 diabetes mellitus with hyperglycemia: Secondary | ICD-10-CM

## 2017-10-02 DIAGNOSIS — I248 Other forms of acute ischemic heart disease: Secondary | ICD-10-CM | POA: Diagnosis present

## 2017-10-02 DIAGNOSIS — G890 Central pain syndrome: Secondary | ICD-10-CM | POA: Diagnosis present

## 2017-10-02 DIAGNOSIS — F411 Generalized anxiety disorder: Secondary | ICD-10-CM | POA: Diagnosis present

## 2017-10-02 DIAGNOSIS — E119 Type 2 diabetes mellitus without complications: Secondary | ICD-10-CM | POA: Diagnosis present

## 2017-10-02 DIAGNOSIS — Z87891 Personal history of nicotine dependence: Secondary | ICD-10-CM | POA: Diagnosis not present

## 2017-10-02 DIAGNOSIS — G894 Chronic pain syndrome: Secondary | ICD-10-CM | POA: Diagnosis present

## 2017-10-02 DIAGNOSIS — E785 Hyperlipidemia, unspecified: Secondary | ICD-10-CM | POA: Diagnosis present

## 2017-10-02 DIAGNOSIS — Z23 Encounter for immunization: Secondary | ICD-10-CM | POA: Diagnosis present

## 2017-10-02 DIAGNOSIS — R000 Tachycardia, unspecified: Secondary | ICD-10-CM | POA: Diagnosis not present

## 2017-10-02 DIAGNOSIS — Z7984 Long term (current) use of oral hypoglycemic drugs: Secondary | ICD-10-CM

## 2017-10-02 DIAGNOSIS — Z79891 Long term (current) use of opiate analgesic: Secondary | ICD-10-CM | POA: Diagnosis not present

## 2017-10-02 DIAGNOSIS — R748 Abnormal levels of other serum enzymes: Secondary | ICD-10-CM | POA: Diagnosis not present

## 2017-10-02 HISTORY — DX: Other pulmonary embolism without acute cor pulmonale: I26.99

## 2017-10-02 LAB — BASIC METABOLIC PANEL
Anion gap: 10 (ref 5–15)
BUN: 15 mg/dL (ref 6–20)
CO2: 24 mmol/L (ref 22–32)
Calcium: 9.4 mg/dL (ref 8.9–10.3)
Chloride: 103 mmol/L (ref 101–111)
Creatinine, Ser: 0.91 mg/dL (ref 0.44–1.00)
GFR calc Af Amer: >60 mL/min (ref >60)
GFR calc non Af Amer: >60 mL/min (ref >60)
Glucose, Bld: 128 mg/dL — ABNORMAL HIGH (ref 65–99)
Potassium: 4.2 mmol/L (ref 3.5–5.1)
Sodium: 137 mmol/L (ref 135–145)

## 2017-10-02 LAB — CBC
HCT: 42.8 % (ref 35.0–47.0)
Hemoglobin: 14.1 g/dL (ref 12.0–16.0)
MCH: 26.8 pg (ref 26.0–34.0)
MCHC: 33 g/dL (ref 32.0–36.0)
MCV: 81.3 fL (ref 80.0–100.0)
Platelets: 290 10*3/uL (ref 150–440)
RBC: 5.26 MIL/uL — ABNORMAL HIGH (ref 3.80–5.20)
RDW: 15.2 % — ABNORMAL HIGH (ref 11.5–14.5)
WBC: 10.4 10*3/uL (ref 3.6–11.0)

## 2017-10-02 LAB — TROPONIN I: Troponin I: 0.17 ng/mL (ref <0.03)

## 2017-10-02 LAB — GLUCOSE, CAPILLARY: Glucose-Capillary: 150 mg/dL — ABNORMAL HIGH (ref 65–99)

## 2017-10-02 MED ORDER — HYDROCODONE-ACETAMINOPHEN 5-325 MG PO TABS: 1.0000 | ORAL_TABLET | ORAL | Status: DC | PRN

## 2017-10-02 MED ORDER — ONDANSETRON HCL 4 MG PO TABS: 4.0000 mg | ORAL_TABLET | Freq: Four times a day (QID) | ORAL | Status: DC | PRN

## 2017-10-02 MED ORDER — INSULIN ASPART 100 UNIT/ML ~~LOC~~ SOLN
0.0000 [IU] | Freq: Three times a day (TID) | SUBCUTANEOUS | Status: DC
  Administered 2017-10-03: 2 [IU] via SUBCUTANEOUS
  Administered 2017-10-03: 3 [IU] via SUBCUTANEOUS
  Filled 2017-10-02 (×2): qty 1

## 2017-10-02 MED ORDER — ACETAMINOPHEN 650 MG RE SUPP: 650.0000 mg | Freq: Four times a day (QID) | RECTAL | Status: DC | PRN

## 2017-10-02 MED ORDER — PNEUMOCOCCAL VAC POLYVALENT 25 MCG/0.5ML IJ INJ
0.5000 mL | INJECTION | INTRAMUSCULAR | Status: AC
  Administered 2017-10-03: 0.5 mL via INTRAMUSCULAR
  Filled 2017-10-02: qty 0.5

## 2017-10-02 MED ORDER — ALPRAZOLAM 0.25 MG PO TABS
0.2500 mg | ORAL_TABLET | Freq: Three times a day (TID) | ORAL | Status: DC | PRN
  Administered 2017-10-02: 0.25 mg via ORAL
  Filled 2017-10-02: qty 1

## 2017-10-02 MED ORDER — ASPIRIN 81 MG PO CHEW
324.0000 mg | CHEWABLE_TABLET | Freq: Once | ORAL | Status: AC
  Administered 2017-10-02: 324 mg via ORAL
  Filled 2017-10-02: qty 4

## 2017-10-02 MED ORDER — IBUPROFEN 400 MG PO TABS
800.0000 mg | ORAL_TABLET | Freq: Two times a day (BID) | ORAL | Status: DC
  Filled 2017-10-02 (×2): qty 2

## 2017-10-02 MED ORDER — HYDROMORPHONE HCL 1 MG/ML IJ SOLN
1.0000 mg | Freq: Once | INTRAMUSCULAR | Status: AC
  Administered 2017-10-02: 1 mg via INTRAVENOUS

## 2017-10-02 MED ORDER — SENNOSIDES-DOCUSATE SODIUM 8.6-50 MG PO TABS: 1.0000 | ORAL_TABLET | Freq: Every evening | ORAL | Status: DC | PRN

## 2017-10-02 MED ORDER — SODIUM CHLORIDE 0.9% FLUSH
3.0000 mL | Freq: Two times a day (BID) | INTRAVENOUS | Status: DC
  Administered 2017-10-02 – 2017-10-03 (×2): 3 mL via INTRAVENOUS

## 2017-10-02 MED ORDER — ENOXAPARIN SODIUM 120 MG/0.8ML ~~LOC~~ SOLN
1.0000 mg/kg | Freq: Two times a day (BID) | SUBCUTANEOUS | Status: DC
  Administered 2017-10-02 – 2017-10-03 (×2): 105 mg via SUBCUTANEOUS
  Filled 2017-10-02 (×2): qty 0.8

## 2017-10-02 MED ORDER — ALBUTEROL SULFATE (2.5 MG/3ML) 0.083% IN NEBU: 2.5000 mg | INHALATION_SOLUTION | RESPIRATORY_TRACT | Status: DC | PRN

## 2017-10-02 MED ORDER — OXYCODONE HCL 5 MG PO TABS
5.0000 mg | ORAL_TABLET | ORAL | Status: DC | PRN
  Administered 2017-10-02 – 2017-10-03 (×2): 5 mg via ORAL
  Filled 2017-10-02 (×2): qty 1

## 2017-10-02 MED ORDER — SODIUM CHLORIDE 0.9 % IV BOLUS (SEPSIS)
1000.0000 mL | Freq: Once | INTRAVENOUS | Status: AC
Start: 2017-10-02 — End: 2017-10-02
  Administered 2017-10-02: 1000 mL via INTRAVENOUS

## 2017-10-02 MED ORDER — TRAZODONE HCL 100 MG PO TABS
100.0000 mg | ORAL_TABLET | Freq: Every evening | ORAL | Status: DC | PRN
  Administered 2017-10-02: 100 mg via ORAL
  Filled 2017-10-02: qty 1

## 2017-10-02 MED ORDER — ATORVASTATIN CALCIUM 10 MG PO TABS: 10.0000 mg | ORAL_TABLET | Freq: Every evening | ORAL | Status: DC

## 2017-10-02 MED ORDER — HYDROMORPHONE HCL 1 MG/ML IJ SOLN
INTRAMUSCULAR | Status: AC
  Filled 2017-10-02: qty 1

## 2017-10-02 MED ORDER — POLYETHYLENE GLYCOL 3350 17 G PO PACK
17.0000 g | PACK | Freq: Every day | ORAL | Status: DC
  Administered 2017-10-03: 17 g via ORAL
  Filled 2017-10-02: qty 1

## 2017-10-02 MED ORDER — BUPROPION HCL ER (XL) 150 MG PO TB24
300.0000 mg | ORAL_TABLET | Freq: Every day | ORAL | Status: DC
  Administered 2017-10-03: 300 mg via ORAL
  Filled 2017-10-02: qty 2

## 2017-10-02 MED ORDER — SODIUM CHLORIDE 0.9% FLUSH: 3.0000 mL | INTRAVENOUS | Status: DC | PRN

## 2017-10-02 MED ORDER — GABAPENTIN (ONCE-DAILY) 600 MG PO TABS: 1800.0000 mg | ORAL_TABLET | Freq: Every evening | ORAL | Status: DC

## 2017-10-02 MED ORDER — IBUPROFEN-FAMOTIDINE 800-26.6 MG PO TABS: 1.0000 | ORAL_TABLET | Freq: Two times a day (BID) | ORAL | Status: DC

## 2017-10-02 MED ORDER — INSULIN ASPART 100 UNIT/ML ~~LOC~~ SOLN: 0.0000 [IU] | Freq: Every day | SUBCUTANEOUS | Status: DC

## 2017-10-02 MED ORDER — SERTRALINE HCL 50 MG PO TABS
50.0000 mg | ORAL_TABLET | Freq: Every day | ORAL | Status: DC
  Administered 2017-10-03: 50 mg via ORAL
  Filled 2017-10-02: qty 1

## 2017-10-02 MED ORDER — GABAPENTIN 300 MG PO CAPS
600.0000 mg | ORAL_CAPSULE | Freq: Three times a day (TID) | ORAL | Status: DC
Start: 2017-10-02 — End: 2017-10-03
  Administered 2017-10-02 – 2017-10-03 (×2): 600 mg via ORAL
  Filled 2017-10-02 (×2): qty 2

## 2017-10-02 MED ORDER — ONDANSETRON HCL 4 MG/2ML IJ SOLN: 4.0000 mg | Freq: Four times a day (QID) | INTRAMUSCULAR | Status: DC | PRN

## 2017-10-02 MED ORDER — ACETAMINOPHEN 325 MG PO TABS: 650.0000 mg | ORAL_TABLET | Freq: Four times a day (QID) | ORAL | Status: DC | PRN

## 2017-10-02 MED ORDER — MORPHINE SULFATE ER 30 MG PO TBCR
60.0000 mg | EXTENDED_RELEASE_TABLET | Freq: Two times a day (BID) | ORAL | Status: DC
Start: 2017-10-02 — End: 2017-10-03
  Administered 2017-10-02 – 2017-10-03 (×2): 60 mg via ORAL
  Filled 2017-10-02 (×2): qty 2

## 2017-10-02 MED ORDER — SODIUM CHLORIDE 0.9 % IV SOLN: 250.0000 mL | INTRAVENOUS | Status: DC | PRN

## 2017-10-02 MED ORDER — BISACODYL 5 MG PO TBEC: 5.0000 mg | DELAYED_RELEASE_TABLET | Freq: Every day | ORAL | Status: DC | PRN

## 2017-10-02 MED ORDER — IOPAMIDOL (ISOVUE-370) INJECTION 76%
75.0000 mL | Freq: Once | INTRAVENOUS | Status: AC | PRN
  Administered 2017-10-02: 75 mL via INTRAVENOUS

## 2017-10-02 MED ORDER — HYDROMORPHONE HCL 1 MG/ML IJ SOLN: 0.2500 mg | INTRAMUSCULAR | Status: DC | PRN

## 2017-10-02 MED ORDER — FAMOTIDINE 20 MG PO TABS
20.0000 mg | ORAL_TABLET | Freq: Two times a day (BID) | ORAL | Status: DC
  Filled 2017-10-02 (×2): qty 1

## 2017-10-02 MED ORDER — LORATADINE 10 MG PO TABS
10.0000 mg | ORAL_TABLET | Freq: Every day | ORAL | Status: DC
  Administered 2017-10-02 – 2017-10-03 (×2): 10 mg via ORAL
  Filled 2017-10-02 (×2): qty 1

## 2017-10-02 NOTE — ED Notes (Signed)
POC Urine= NEGATIVE RESULT

## 2017-10-02 NOTE — ED Notes (Signed)
Pt states she "gained six lbs overnight."

## 2017-10-02 NOTE — ED Triage Notes (Addendum)
Pt states sudden onset of shortness of breath and tachycardia this morning. Pt states performing normal activities that were not strenuous and she experienced shortness of breatha and racing heart. Pt took rx'd Xanax 1.5 mg w/o relief. Pt is presently short of breath abnd tachycardiac. Pt states chest pressure, relieved w/ belching.

## 2017-10-02 NOTE — ED Notes (Signed)
Pt eating sandwich tray at this time  

## 2017-10-02 NOTE — ED Notes (Signed)
Pt given drink per EDP

## 2017-10-02 NOTE — H&P (Signed)
[facility] [place] [facility] NAME: Amanda Vasquez    MR#:  [id number]  DATE OF BIRTH:  Jul 28, 1971  DATE OF ADMISSION:  10/02/2017  PRIMARY CARE PHYSICIAN: System, Pcp Not In   REQUESTING/REFERRING PHYSICIAN: Arta Silence, MD  CHIEF COMPLAINT:   Chief Complaint  Patient presents with  . Shortness of Breath  . Chest Pain   Chest pain and shortness breath today. HISTORY OF PRESENT ILLNESS:  Amanda Vasquez  is a 47 y.o. female with a known history of multiple medical problems as below.  Patient was fine to this morning with she walked next door, developed a shortness of breath and chest pain on the left side.  She also complains of generalized weakness.  The patient said with symptoms persisted for about 7 hours.  She denies any fever, chills, wheezing or hemoptysis.  She denies any long travel history.  The CAT scan of chest angiogram show acute or subacute submassive PE.  Lovenox started by ED physician.  PAST MEDICAL HISTORY:   Past Medical History:  Diagnosis Date  . Acute meniscal tear of left knee   . Allergic rhinitis   . Chronic constipation   . Demyelinating disorder ([place])    brain lesion that touches thalamic  . Diabetes mellitus, type II ([place])    controlled with medication;  . Dyslipidemia   . General weakness    left hand and leg  . Generalized anxiety disorder    mostly controlled  . History of adenomatous polyp of colon   . History of colitis   . Knee pain   . Migraine   . 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 Wears glasses     PAST SURGICAL HISTORY:   Past Surgical History:  Procedure Laterality Date  . BREAST REDUCTION SURGERY Bilateral 1997  . CHOLECYSTECTOMY  1998  . KNEE ARTHROSCOPY Right 1993  .  KNEE ARTHROSCOPY WITH MEDIAL MENISECTOMY Left 10/09/2015   Procedure: LEFT KNEE ARTHROSCOPY WITH PARTIAL MEDIAL MENISCECTOMY; PATELLA-FEMORAL CHONDROPLASTY;  Surgeon: Rod Can, MD;  Location: [facility];  Service: Orthopedics;  Laterality: Left;  . TRANSTHORACIC ECHOCARDIOGRAM  03-02-2015   normal LVF,  ef 65-70%    SOCIAL HISTORY:   Social History   Tobacco Use  . Smoking status: Former Smoker    Packs/day: 0.25    Years: 20.00    Pack years: 5.00    Types: Cigarettes    Start date: 06/08/1986    Last attempt to quit: 08/22/2010    Years since quitting: 7.1  . Smokeless tobacco: Never Used  Substance Use Topics  . Alcohol use: No    Alcohol/week: 0.0 oz    FAMILY HISTORY:   Family History  Adopted: Yes    DRUG ALLERGIES:   Allergies  Allergen Reactions  . Latex Hives    REVIEW OF SYSTEMS:   Review of Systems  Constitutional: Positive for malaise/fatigue. Negative for chills and fever.  HENT: Negative for sore throat.   Eyes: Negative for blurred vision and double vision.  Respiratory: Positive for shortness of breath. Negative for cough, hemoptysis, sputum production, wheezing and stridor.   Cardiovascular: Positive for chest pain. Negative for palpitations, orthopnea and leg swelling.  Gastrointestinal: Negative for abdominal pain, blood in stool, diarrhea, melena,  nausea and vomiting.  Genitourinary: Negative for dysuria, flank pain and hematuria.  Musculoskeletal: Negative for back pain and joint pain.  Skin: Negative for rash.  Neurological: Positive for weakness. Negative for dizziness, sensory change, focal weakness, seizures, loss of consciousness and headaches.  Endo/Heme/Allergies: Negative for polydipsia.  Psychiatric/Behavioral: Negative for depression. The patient is not nervous/anxious.     MEDICATIONS AT HOME:   Prior to Admission medications   Medication Sig Start Date End Date Taking? Authorizing Provider  ALPRAZolam  (XANAX) 0.5 MG tablet TAKE 1 TABLET BY MOUTH 4 TIMES DAILY AS NEEDED FOR ANXIETY 08/17/17  Yes Lucille Passy, MD  atorvastatin (LIPITOR) 10 MG tablet TAKE ONE TABLET BY MOUTH EVERY EVENING 01/02/17  Yes Lucille Passy, MD  baclofen (LIORESAL) 10 MG tablet Take 1 tablet (10 mg total) by mouth 3 (three) times daily. 08/11/15  Yes Rowe Clack, MD  buPROPion (WELLBUTRIN XL) 300 MG 24 hr tablet TAKE ONE TABLET EVERY DAY NEED OFFICE VISIT FOR MORE REFILLS 08/17/17  Yes Lucille Passy, MD  Cetirizine HCl 10 MG CAPS Take 1 capsule (10 mg total) by mouth daily as needed. Take by mouth. Patient taking differently: Take 10 mg by mouth every evening. Take by mouth. 08/11/15  Yes Rowe Clack, MD  Cholecalciferol (VITAMIN D3) 2000 UNITS TABS Take 100-2,000 Units by mouth daily.    Yes [provider]  empagliflozin (JARDIANCE) 25 MG TABS tablet Take 25 mg by mouth daily. 07/24/17  Yes Philemon Kingdom, MD  glipiZIDE (GLUCOTROL) 5 MG tablet Take 2 tablets per day 09/28/17  Yes Philemon Kingdom, MD  GRALISE 600 MG TABS Take 1,800 mg by mouth every evening.  02/20/15  Yes [provider]  metFORMIN (GLUCOPHAGE) 1000 MG tablet TAKE 1 TABLET BY MOUTH TWICE DAILY WITH A MEAL 06/01/17  Yes Lucille Passy, MD  morphine (MS CONTIN) 60 MG 12 hr tablet Take 60 mg by mouth every 12 (twelve) hours.   Yes [provider]  multivitamin-iron-minerals-folic acid (CENTRUM) chewable tablet Chew 1 tablet by mouth daily.   Yes [provider]  sertraline (ZOLOFT) 50 MG tablet TAKE 1 TABLET BY MOUTH DAILY 09/18/17  Yes Lucille Passy, MD  traZODone (DESYREL) 100 MG tablet TAKE 1 TABLET BY MOUTH AT BEDTIME AS NEEDED FOR SLEEP 09/18/17  Yes Lucille Passy, MD  dronabinol (MARINOL) 2.5 MG capsule Take 2.5 mg by mouth daily as needed. 03/02/16   [provider]  metoCLOPramide (REGLAN) 5 MG tablet TAKE 1 TABLET 4 TIMES A DAY BEFORE MEALSAND AT BEDTIME 01/02/17   Lucille Passy, MD    naratriptan (AMERGE) 2.5 MG tablet Take one daily for 4 days. Patient not taking: Reported on 10/02/2017 06/21/17   Melvenia Beam, MD  oxyCODONE (OXY IR/ROXICODONE) 5 MG immediate release tablet Take 1 tablet (5 mg total) by mouth every 4 (four) hours as needed for severe pain. Per pain clinic Dr Hardin Negus 08/11/15   Rowe Clack, MD  polyethylene glycol [facility] / Floria Raveling) packet Take 17 g by mouth daily.     [provider]  promethazine (PHENERGAN) 25 MG tablet Take 1 tablet (25 mg total) by mouth every 8 (eight) hours as needed for nausea or vomiting (migraine). Caution of sedation 05/26/17   Tower, Wynelle Fanny, MD  valACYclovir (VALTREX) 1000 MG tablet Take 1 tablet (1,000 mg total) by mouth daily as needed. Must estab w/new provider for future refills 05/12/16   Lucille Passy, MD  VITAL SIGNS:  Blood pressure 126/80, pulse 100, temperature 98.4 F (36.9 C), temperature source Oral, resp. rate 14, height 5\' 4"  (1.626 m), weight 228 lb (103.4 kg), SpO2 95 %.  PHYSICAL EXAMINATION:  Physical Exam  GENERAL:  47 y.o.-year-old patient lying in the bed with no acute distress.  Obesity. EYES: Pupils equal, round, reactive to light and accommodation. No scleral icterus. Extraocular muscles intact.  HEENT: Head atraumatic, normocephalic. Oropharynx and nasopharynx clear.  NECK:  Supple, no jugular venous distention. No thyroid enlargement, no tenderness.  LUNGS: Normal breath sounds bilaterally, no wheezing, rales,rhonchi or crepitation. No use of accessory muscles of respiration.  CARDIOVASCULAR: S1, S2 normal. No murmurs, rubs, or gallops.  ABDOMEN: Soft, nontender, nondistended. Bowel sounds present. No organomegaly or mass.  EXTREMITIES: No pedal edema, cyanosis, or clubbing.  No calf tenderness. NEUROLOGIC: Cranial nerves II through XII are intact. Muscle strength 5/5 in all extremities. Sensation intact. Gait not checked.  PSYCHIATRIC: The patient is alert and oriented x  3.  SKIN: No obvious rash, lesion, or ulcer.   LABORATORY PANEL:   CBC Recent Labs  Lab 10/02/17 1657  WBC 10.4  HGB 14.1  HCT 42.8  PLT 290   ------------------------------------------------------------------------------------------------------------------  Chemistries  Recent Labs  Lab 10/02/17 1657  NA 137  K 4.2  CL 103  CO2 24  GLUCOSE 128*  BUN 15  CREATININE 0.91  CALCIUM 9.4   ------------------------------------------------------------------------------------------------------------------  Cardiac Enzymes Recent Labs  Lab 10/02/17 1657  TROPONINI 0.17*   ------------------------------------------------------------------------------------------------------------------  RADIOLOGY:  Dg Chest 2 View  Result Date: 10/02/2017 CLINICAL DATA:  47 year old with tachycardia and dyspnea. EXAM: CHEST  2 VIEW COMPARISON:  12/22/2014 FINDINGS: The heart size and mediastinal contours are within normal limits. Both lungs are clear. The visualized skeletal structures are unremarkable. IMPRESSION: No active cardiopulmonary disease. Electronically Signed   By: Markus Daft M.D.   On: 10/02/2017 17:20   Ct Angio Chest Pe W And/or Wo Contrast  Result Date: 10/02/2017 CLINICAL DATA:  Acute onset of shortness of breath, chest pain, and tachycardia this morning. EXAM: CT ANGIOGRAPHY CHEST WITH CONTRAST TECHNIQUE: Multidetector CT imaging of the chest was performed using the standard protocol during bolus administration of intravenous contrast. Multiplanar CT image reconstructions and MIPs were obtained to evaluate the vascular anatomy. CONTRAST:  48mL ISOVUE-370 IOPAMIDOL (ISOVUE-370) INJECTION 76% COMPARISON:  None. FINDINGS: Cardiovascular: Nonocclusive pulmonary emboli are seen in the right and left pulmonary arteries, and extending into the right upper and bilateral lower lobar and segmental branches. This may be acute or subacute in age. RV/LV ratio measures 1.05, which is  considered positive for right heart strain. Mediastinum/Nodes: No masses or pathologically enlarged lymph nodes identified. Lungs/Pleura: No pulmonary mass, infiltrate, or effusion. Upper abdomen: No acute findings. Musculoskeletal: No suspicious bone lesions identified. Review of the MIP images confirms the above findings. IMPRESSION: Positive for acute or subacute PE with CT evidence of right heart strain (RV/LV Ratio = 1.05) consistent with at least submassive (intermediate risk) PE. The presence of right heart strain has been associated with an increased risk of morbidity and mortality. Please activate Code PE by paging [phone]. Critical Value/emergent results were called by telephone at the time of interpretation on 10/02/2017 at 8:06 pm to Dr. Arta Silence , who verbally acknowledged these results. Electronically Signed   By: Earle Gell M.D.   On: 10/02/2017 20:10      IMPRESSION AND PLAN:  Acute or subacute PE  The patient will be admitted to  medical floor. Start Lovenox pharmacy to dose, check bilateral leg venous duplex and hematology consult.  Elevated troponin, due to demanding ischemia.  Continue Lovenox, follow-up troponin level and echocardiograph.  Chronic pain syndrome.  Continue home pain medication.  All the records are reviewed and case discussed with ED provider. Management plans discussed with the patient, her mother and they are in agreement.  CODE STATUS: Full code  TOTAL TIME TAKING CARE OF THIS PATIENT: 56 minutes.    Demetrios Loll M.D on 10/02/2017 at 9:46 PM  Between 7am to 6pm - Pager - [phone]  After 6pm go to [email or web address] - Proofreader  [facility]  Office  [phone]  CC: Primary care physician; System, Pcp Not In   Note: This dictation was prepared with Dragon dictation along with smaller phrase technology. Any transcriptional errors that result from this process are unin

## 2017-10-02 NOTE — ED Provider Notes (Signed)
[facility] Emergency Department Provider Note ____________________________________________   First MD Initiated Contact with Patient 10/02/17 1823     (approximate)  I have reviewed the triage vital signs and the nursing notes.   HISTORY  Chief Complaint Shortness of Breath and Chest Pain    HPI Amanda Vasquez is a 47 y.o. female with past medical history as noted below who presents with shortness of breath, acute onset at approximately 1130 this morning when the patient walked next door, associated with discomfort particularly in the left upper part of her chest, and associated with generalized weakness.  Patient states that the symptoms have persisted over the last 7 hours.  She denies any prior history of this symptom.  She denies any leg pain or swelling, cough, fever, vomiting or other GI symptoms, or back pain.  Past Medical History:  Diagnosis Date  . Acute meniscal tear of left knee   . Allergic rhinitis   . Chronic constipation   . Demyelinating disorder ([place])    brain lesion that touches thalamic  . Diabetes mellitus, type II ([place])    controlled with medication;  . Dyslipidemia   . General weakness    left hand and leg  . Generalized anxiety disorder    mostly controlled  . History of adenomatous polyp of colon   . History of colitis   . Knee pain   . Migraine   . 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 Wears glasses     Patient Active Problem List   Diagnosis Date Noted  . Pulmonary emboli ([place]) 10/02/2017  . Obesity (BMI 30-39.9) 07/24/2017  . Migraine headache 05/17/2017  . Anxiety and depression 01/30/2017  . Dot and blot hemorrhage, left 01/19/2017  . Gastroparesis 06/22/2016  . Fatigue 03/10/2016  . Pre-operative  cardiovascular examination 09/21/2015  . 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 CTS (carpal tunnel syndrome) 02/25/2015  . Paresthesias 02/02/2015  . Demyelinating nervous system disease or syndrome ([place]) 01/20/2015  . Demyelinating disease of central nervous system ([place]) 03/01/2012  . Thalamic pain syndrome (hyperesthetic) 09/07/2010  . Hyperlipidemia 05/28/2010  . OBESITY 02/26/2010  . Herpes simplex virus (HSV) infection 12/21/2009  . ALLERGIC RHINITIS 12/21/2009  . BACK PAIN, THORACIC REGION 11/11/2009  . Type 2 diabetes mellitus with hyperglycemia, without long-term current use of insulin ([place]) 07/28/2008  . Anxiety state 07/03/2008  . AMENORRHEA 12/17/2007  . DISORDER, BIPOLAR, ATYPICAL MANIC 05/14/2007    Past Surgical History:  Procedure Laterality Date  . BREAST REDUCTION SURGERY Bilateral 1997  . CHOLECYSTECTOMY  1998  . KNEE ARTHROSCOPY Right 1993  . KNEE ARTHROSCOPY WITH MEDIAL MENISECTOMY Left 10/09/2015   Procedure: LEFT KNEE ARTHROSCOPY WITH PARTIAL MEDIAL MENISCECTOMY; PATELLA-FEMORAL CHONDROPLASTY;  Surgeon: Rod Can, MD;  Location: [employer];  Service: Orthopedics;  Laterality: Left;  . TRANSTHORACIC ECHOCARDIOGRAM  03-02-2015   normal LVF,  ef 65-70%    Prior to Admission medications   Medication Sig Start Date End Date Taking? Authorizing Provider  ALPRAZolam Duanne Moron) 0.5 MG tablet TAKE 1 TABLET BY MOUTH 4 TIMES DAILY AS NEEDED FOR ANXIETY 08/17/17   Lucille Passy, MD  atorvastatin (LIPITOR) 10 MG tablet TAKE ONE TABLET BY MOUTH EVERY EVENING 01/02/17   Lucille Passy, MD  b complex vitamins tablet  Take 1 tablet by mouth daily.      [provider]  baclofen (LIORESAL) 10 MG tablet Take 1 tablet (10 mg total) by mouth 3 (three) times daily. 08/11/15   Rowe Clack, MD  buPROPion (WELLBUTRIN XL) 300 MG 24 hr tablet TAKE ONE TABLET EVERY DAY NEED OFFICE VISIT FOR MORE REFILLS 08/17/17   Lucille Passy, MD  Cetirizine HCl 10 MG CAPS  Take 1 capsule (10 mg total) by mouth daily as needed. Take by mouth. Patient taking differently: Take 10 mg by mouth every evening. Take by mouth. 08/11/15   Rowe Clack, MD  Cholecalciferol (VITAMIN D3) 2000 UNITS TABS Take 100-2,000 Units by mouth daily.     [provider]  dronabinol (MARINOL) 2.5 MG capsule Take 2.5 mg by mouth daily as needed. 03/02/16   [provider]  DUEXIS 800-26.6 MG TABS Take 1 tablet by mouth 2 (two) times daily. 07/25/16   [provider]  empagliflozin (JARDIANCE) 25 MG TABS tablet Take 25 mg by mouth daily. 07/24/17   Philemon Kingdom, MD  glipiZIDE (GLUCOTROL) 5 MG tablet Take 2 tablets per day 09/28/17   Philemon Kingdom, MD  GRALISE 600 MG TABS Take 1,800 mg by mouth every evening.  02/20/15   [provider]  metFORMIN (GLUCOPHAGE) 1000 MG tablet TAKE 1 TABLET BY MOUTH TWICE DAILY WITH A MEAL 06/01/17   Lucille Passy, MD  metoCLOPramide (REGLAN) 5 MG tablet TAKE 1 TABLET 4 TIMES A DAY BEFORE MEALSAND AT BEDTIME 01/02/17   Lucille Passy, MD  morphine (MS CONTIN) 60 MG 12 hr tablet Take 60 mg by mouth every 12 (twelve) hours.    [provider]  multivitamin-iron-minerals-folic acid (CENTRUM) chewable tablet Chew 1 tablet by mouth daily.    [provider]  naratriptan (AMERGE) 2.5 MG tablet Take one daily for 4 days. 06/21/17   Melvenia Beam, MD  oxyCODONE (OXY IR/ROXICODONE) 5 MG immediate release tablet Take 1 tablet (5 mg total) by mouth every 4 (four) hours as needed for severe pain. Per pain clinic Dr Hardin Negus 08/11/15   Rowe Clack, MD  polyethylene glycol [facility] Floria Raveling) packet Take 17 g by mouth daily.     [provider]  POTASSIUM CHLORIDE PO Take 198 mg by mouth daily. 01/27/11   [provider]  promethazine (PHENERGAN) 25 MG tablet Take 1 tablet (25 mg total) by mouth every 8 (eight) hours as needed for nausea or vomiting (migraine). Caution of sedation 05/26/17    Tower, Roque Lias A, MD  sertraline (ZOLOFT) 50 MG tablet TAKE 1 TABLET BY MOUTH DAILY 09/18/17   Lucille Passy, MD  traZODone (DESYREL) 100 MG tablet TAKE 1 TABLET BY MOUTH AT BEDTIME AS NEEDED FOR SLEEP 09/18/17   Lucille Passy, MD  valACYclovir (VALTREX) 1000 MG tablet Take 1 tablet (1,000 mg total) by mouth daily as needed. Must estab w/new provider for future refills 05/12/16   Lucille Passy, MD    Allergies Latex  Family History  Adopted: Yes    Social History Social History   Tobacco Use  . Smoking status: Former Smoker    Packs/day: 0.25    Years: 20.00    Pack years: 5.00    Types: Cigarettes    Start date: 06/08/1986    Last attempt to quit: 08/22/2010    Years since quitting: 7.1  . Smokeless tobacco: Never Used  Substance Use Topics  . Alcohol use: No  Alcohol/week: 0.0 oz  . Drug use: No    Review of Systems  Constitutional: No fever. Eyes: No redness. ENT: No neck pain. Cardiovascular: Positive for chest pain. Respiratory: Positive for shortness of breath. Gastrointestinal: No nausea, no vomiting.  No diarrhea.  Genitourinary: Negative for dysuria.  Musculoskeletal: Negative for back pain. Skin: Negative for rash. Neurological: Negative for headache   ____________________________________________   PHYSICAL EXAM:  VITAL SIGNS: ED Triage Vitals  Enc Vitals Group     BP 10/02/17 1643 124/81     Pulse Rate 10/02/17 1643 (!) 132     Resp 10/02/17 1643 (!) 22     Temp 10/02/17 1643 98.4 F (36.9 C)     Temp Source 10/02/17 1643 Oral     SpO2 10/02/17 1643 96 %     Weight 10/02/17 1644 228 lb (103.4 kg)     Height 10/02/17 1644 5\' 4"  (1.626 m)     Head Circumference --      Peak Flow --      Pain Score 10/02/17 1653 4     Pain Loc --      Pain Edu? --      Excl. in [place]? --     Constitutional: Alert and oriented.  Relatively comfortable appearing and in no acute distress. Eyes: Conjunctivae are normal.  Head: Atraumatic. Nose: No  congestion/rhinnorhea. Mouth/Throat: Mucous membranes are moist.   Neck: Normal range of motion.  Cardiovascular: Tachycardic, regular rhythm. Grossly normal heart sounds.  Good peripheral circulation. Respiratory: Normal respiratory effort.  No retractions. Lungs CTAB. Gastrointestinal: No distention.  Genitourinary: No CVA tenderness. Musculoskeletal: No lower extremity edema.  Extremities warm and well perfused.  No calf or popliteal swelling or tenderness. Neurologic:  Normal speech and language. No gross focal neurologic deficits are appreciated.  Skin:  Skin is warm and dry. No rash noted. Psychiatric: Mood and affect are normal. Speech and behavior are normal.  ____________________________________________   LABS (all labs ordered are listed, but only abnormal results are displayed)  Labs Reviewed  BASIC METABOLIC PANEL - Abnormal; Notable for the following components:      Result Value   Glucose, Bld 128 (*)    All other components within normal limits  CBC - Abnormal; Notable for the following components:   RBC 5.26 (*)    RDW 15.2 (*)    All other components within normal limits  TROPONIN I - Abnormal; Notable for the following components:   Troponin I 0.17 (*)    All other components within normal limits  POC URINE PREG, ED   ____________________________________________  EKG  ED ECG REPORT I, Arta Silence, the attending physician, personally viewed and interpreted this ECG.  Date: 10/02/2017 EKG Time: 1640 Rate: 129 Rhythm: Sinus tachycardia QRS Axis: normal Intervals: Left posterior fascicular block ST/T Wave abnormalities: S1 Q3 T3 Narrative Interpretation: no evidence of acute ischemia; no significant change when compared to EKG of 09/21/2015  ____________________________________________  RADIOLOGY  CXR: No focal infiltrate or other acute finding   ____________________________________________   PROCEDURES  Procedure(s) performed:  No  Procedures  Critical Care performed: Yes  CRITICAL CARE Performed by: Arta Silence   Total critical care time: 40 minutes  Critical care time was exclusive of separately billable procedures and treating other patients.  Critical care was necessary to treat or prevent imminent or life-threatening deterioration.  Critical care was time spent personally by me on the following activities: development of treatment plan with patient and/or surrogate as well  as nursing, discussions with consultants, evaluation of patient's response to treatment, examination of patient, obtaining history from patient or surrogate, ordering and performing treatments and interventions, ordering and review of laboratory studies, ordering and review of radiographic studies, pulse oximetry and re-evaluation of patient's condition. ____________________________________________   INITIAL IMPRESSION / ASSESSMENT AND PLAN / ED COURSE  Pertinent labs & imaging results that were available during my care of the patient were reviewed by me and considered in my medical decision making (see chart for details).  48 year old female with past medical history as noted above presents with acute onset of shortness of breath, chest discomfort and palpitations, starting approximately 7 hours ago and persisting since then.  No prior history of this symptom.  Past medical records reviewed in epic and are noncontributory.  On exam, the patient is tachycardic to approximately 120 when I examined her, but the other vital signs are normal.  She is slightly anxious but otherwise relatively well-appearing, and the remainder the exam is unremarkable.  EKG shows S1 Q3 T3 although this is not significantly changed since her prior.  Presentation is most concerning for PE given the acute onset of the symptoms and the persistent tachycardia, differential also includes ACS, anxiety, dehydration, viral syndrome, musculoskeletal pain.   Given patient's tachycardia and the overall presentation with high pretest probability for PE, there is no indication for d-dimer.  We will obtain CT chest, basic labs, cardiac labs, and reassess.    ----------------------------------------- 6:50 PM on 10/02/2017 -----------------------------------------  Patient's troponin is elevated.  With the patient's presentation primarily of shortness of breath and palpitations I am still more concerned for PE than ACS.  Aspirin given in case this is a manifestation of ACS, and the patient will be taken for CT.  Reassess after CT is completed.  ----------------------------------------- 8:22 PM on 10/02/2017 -----------------------------------------  CT confirms bilateral PE with evidence of right heart strain.  Patient remains stable and is on the cardiac monitor; her heart rate is now down to the 100-110 range.  Patient had increased pain and given that she is on chronic pain medications she was given Dilaudid here with significant improvement in her symptoms.  Since the patient has signs of right heart strain, elevated troponin, tachycardia she is appropriate for admission.  I ordered Lovenox for anticoagulation, and will proceed with admission.  I had an extensive discussion with the patient and her mother regarding the results of the workup and the plan of care.  ----------------------------------------- 8:29 PM on 10/02/2017 -----------------------------------------  Patient signed out to the hospitalist Dr. Bridgett Larsson.  ____________________________________________   FINAL CLINICAL IMPRESSION(S) / ED DIAGNOSES  Final diagnoses:  Other acute pulmonary embolism without acute cor pulmonale ([place])      NEW MEDICATIONS STARTED DURING THIS VISIT:  New Prescriptions   No medications on file     Note:  This document was prepared using Dragon voice recognition software and may include unintentional dictation errors.    Arta Silence,  MD 10/02/17 2029

## 2017-10-03 ENCOUNTER — Inpatient Hospital Stay: Payer: Medicare Other

## 2017-10-03 ENCOUNTER — Inpatient Hospital Stay
Admit: 2017-10-03 | Discharge: 2017-10-03 | Disposition: A | Discharge status: Home / Self Care | Payer: Medicare Other | Attending: Internal Medicine | Admitting: Internal Medicine

## 2017-10-03 DIAGNOSIS — Z23 Encounter for immunization: Secondary | ICD-10-CM | POA: Diagnosis not present

## 2017-10-03 LAB — BASIC METABOLIC PANEL
Anion gap: 9 (ref 5–15)
BUN: 12 mg/dL (ref 6–20)
CO2: 24 mmol/L (ref 22–32)
Calcium: 8.8 mg/dL — ABNORMAL LOW (ref 8.9–10.3)
Chloride: 104 mmol/L (ref 101–111)
Creatinine, Ser: 0.88 mg/dL (ref 0.44–1.00)
GFR calc Af Amer: >60 mL/min (ref >60)
GFR calc non Af Amer: >60 mL/min (ref >60)
Glucose, Bld: 124 mg/dL — ABNORMAL HIGH (ref 65–99)
Potassium: 3.9 mmol/L (ref 3.5–5.1)
Sodium: 137 mmol/L (ref 135–145)

## 2017-10-03 LAB — GLUCOSE, CAPILLARY
Glucose-Capillary: 156 mg/dL — ABNORMAL HIGH (ref 65–99)
Glucose-Capillary: 229 mg/dL — ABNORMAL HIGH (ref 65–99)

## 2017-10-03 LAB — ECHOCARDIOGRAM COMPLETE
Height: 64 in
Weight: 3614.4 oz

## 2017-10-03 LAB — CBC
HCT: 38.2 % (ref 35.0–47.0)
Hemoglobin: 12.3 g/dL (ref 12.0–16.0)
MCH: 26.7 pg (ref 26.0–34.0)
MCHC: 32.3 g/dL (ref 32.0–36.0)
MCV: 82.5 fL (ref 80.0–100.0)
Platelets: 235 10*3/uL (ref 150–440)
RBC: 4.62 MIL/uL (ref 3.80–5.20)
RDW: 15.2 % — ABNORMAL HIGH (ref 11.5–14.5)
WBC: 7.7 10*3/uL (ref 3.6–11.0)

## 2017-10-03 MED ORDER — APIXABAN 5 MG PO TABS: 5.0000 mg | ORAL_TABLET | Freq: Two times a day (BID) | ORAL | Status: DC

## 2017-10-03 MED ORDER — APIXABAN 5 MG PO TABS: 10.0000 mg | ORAL_TABLET | Freq: Two times a day (BID) | ORAL | Status: DC

## 2017-10-03 MED ORDER — APIXABAN 5 MG PO TABS: ORAL_TABLET | ORAL | 1 refills | Status: DC

## 2017-10-03 NOTE — Care Management (Signed)
Notified RN that pt can take eliqius  Beola Cord, sn

## 2017-10-03 NOTE — Discharge Summary (Signed)
[facility] NAME: Amanda Vasquez    MR#:  [id number]  DATE OF BIRTH:  [birth year]  DATE OF ADMISSION:  10/02/2017 ADMITTING PHYSICIAN: Demetrios Loll, MD  DATE OF DISCHARGE: 10/03/2017  PRIMARY CARE PHYSICIAN: System, Pcp Not In    ADMISSION DIAGNOSIS:  Pulmonary emboli ([place]) [I26.99] Other acute pulmonary embolism without acute cor pulmonale ([name]) [I26.99]  DISCHARGE DIAGNOSIS:  Active Problems:   Pulmonary emboli ([name])   SECONDARY DIAGNOSIS:   Past Medical History:  Diagnosis Date  . Acute meniscal tear of left knee   . Allergic rhinitis   . Chronic constipation   . Demyelinating disorder ([place])    brain lesion that touches thalamic  . Diabetes mellitus, type II ([place])    controlled with medication;  . Dyslipidemia   . General weakness    left hand and leg  . Generalized anxiety disorder    mostly controlled  . History of adenomatous polyp of colon   . History of colitis   . Knee pain   . Migraine   . 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 Wears glasses     HOSPITAL COURSE:   47 year old female with past medical history of diabetes, chronic pain, anxiety/depression, history of migraines, history of colitis, who presents to the hospital due to shortness of breath and noted to have an acute pulmonary embolism.  1. Acute pulmonary embolism-this was the cause of patient's worsening shortness of breath. Patient was admitted to the hospital and started on subcutaneous Lovenox. -The source of her underlying VTE is unclear. Patient has no history of malignancy, she does not have any recent hip or knee surgery, no family history of DVT. -Patient is clinically not hypoxic and is hemodynamically stable and therefore  switched from Lovenox to Eliquis and is being discharged on that. Patient will be referred to hematology oncology as an outpatient. Patient's Dopplers of her lower extremities were negative for DVT.  2. Diabetes type 2 without complication-patient will continue her glipizide, metformin, Jardiance  3. Anxiety/depression-patient will continue her Wellbutrin, Zoloft, Xanax.  4. Chronic pain-patient will continue her MS Contin.  5. Hyperlipidemia-patient will continue her atorvastatin.  DISCHARGE CONDITIONS:   Stable  CONSULTS OBTAINED:  Treatment Team:  Sindy Guadeloupe, MD  DRUG ALLERGIES:   Allergies  Allergen Reactions  . Latex Hives    DISCHARGE MEDICATIONS:   Allergies as of 10/03/2017      Reactions   Latex Hives      Medication List    TAKE these medications   ALPRAZolam 0.5 MG tablet Commonly known as:  XANAX TAKE 1 TABLET BY MOUTH 4 TIMES DAILY AS NEEDED FOR ANXIETY   apixaban 5 MG Tabs tablet Commonly known as:  ELIQUIS Take 10 mg PO BID X 7 days then 5 mg PO BID thereafter.   atorvastatin 10 MG tablet Commonly known as:  LIPITOR TAKE ONE TABLET BY MOUTH EVERY EVENING   baclofen 10 MG tablet Commonly known as:  LIORESAL Take 1 tablet (10 mg total) by mouth 3 (three) times daily.   buPROPion 300 MG 24 hr tablet Commonly known as:  WELLBUTRIN XL TAKE ONE TABLET EVERY DAY NEED OFFICE VISIT FOR MORE REFILLS   Cetirizine HCl 10 MG Caps Take 1 capsule (10  mg total) by mouth daily as needed. Take by mouth. What changed:    when to take this  additional instructions   dronabinol 2.5 MG capsule Commonly known as:  MARINOL Take 2.5 mg by mouth daily as needed.   empagliflozin 25 MG Tabs tablet Commonly known as:  JARDIANCE Take 25 mg by mouth daily.   glipiZIDE 5 MG tablet Commonly known as:  GLUCOTROL Take 2 tablets per day   GRALISE 600 MG Tabs Generic drug:  Gabapentin (Once-Daily) Take 1,800 mg by mouth every evening.   metFORMIN 1000 MG  tablet Commonly known as:  GLUCOPHAGE TAKE 1 TABLET BY MOUTH TWICE DAILY WITH A MEAL   metoCLOPramide 5 MG tablet Commonly known as:  REGLAN TAKE 1 TABLET 4 TIMES A DAY BEFORE MEALSAND AT BEDTIME   morphine 60 MG 12 hr tablet Commonly known as:  MS CONTIN Take 60 mg by mouth every 12 (twelve) hours.   multivitamin-iron-minerals-folic acid chewable tablet Chew 1 tablet by mouth daily.   naratriptan 2.5 MG tablet Commonly known as:  AMERGE Take one daily for 4 days.   oxyCODONE 5 MG immediate release tablet Commonly known as:  Oxy IR/ROXICODONE Take 1 tablet (5 mg total) by mouth every 4 (four) hours as needed for severe pain. Per pain clinic Dr Hardin Negus   polyethylene glycol packet Commonly known as:  MIRALAX / GLYCOLAX Take 17 g by mouth daily.   promethazine 25 MG tablet Commonly known as:  PHENERGAN Take 1 tablet (25 mg total) by mouth every 8 (eight) hours as needed for nausea or vomiting (migraine). Caution of sedation   sertraline 50 MG tablet Commonly known as:  ZOLOFT TAKE 1 TABLET BY MOUTH DAILY   traZODone 100 MG tablet Commonly known as:  DESYREL TAKE 1 TABLET BY MOUTH AT BEDTIME AS NEEDED FOR SLEEP   valACYclovir 1000 MG tablet Commonly known as:  VALTREX Take 1 tablet (1,000 mg total) by mouth daily as needed. Must estab w/new provider for future refills   Vitamin D3 2000 units Tabs Take 100-2,000 Units by mouth daily.         DISCHARGE INSTRUCTIONS:   DIET:  Cardiac diet and Diabetic diet  DISCHARGE CONDITION:  Stable  ACTIVITY:  Activity as tolerated  OXYGEN:  Home Oxygen: No.   Oxygen Delivery: room air  DISCHARGE LOCATION:  home   If you experience worsening of your admission symptoms, develop shortness of breath, life threatening emergency, suicidal or homicidal thoughts you must seek medical attention immediately by calling 911 or calling your MD immediately  if symptoms less severe.  You Must read complete  instructions/literature along with all the possible adverse reactions/side effects for all the Medicines you take and that have been prescribed to you. Take any new Medicines after you have completely understood and accpet all the possible adverse reactions/side effects.   Please note  You were cared for by a hospitalist during your hospital stay. If you have any questions about your discharge medications or the care you received while you were in the hospital after you are discharged, you can call the unit and asked to speak with the hospitalist on call if the hospitalist that took care of you is not available. Once you are discharged, your primary care physician will handle any further medical issues. Please note that NO REFILLS for any discharge medications will be authorized once you are discharged, as it is imperative that you return to your primary care physician (or establish a relationship  with a primary care physician if you do not have one) for your aftercare needs so that they can reassess your need for medications and monitor your lab values.     Today   Still has some Pleuritic CP on the right. No Hypoxia, Hemodynamic instability.  Tolerating Lovenox and will switch to Eliquis and d/c home and follow up with Oncology as outpatient.   VITAL SIGNS:  Blood pressure 120/83, pulse 91, temperature 98 F (36.7 C), temperature source Oral, resp. rate 16, height 5\' 4"  (1.626 m), weight 102.5 kg (225 lb 14.4 oz), SpO2 98 %.  I/O:    Intake/Output Summary (Last 24 hours) at 10/03/2017 1351 Last data filed at 10/03/2017 0540 Gross per 24 hour  Intake -  Output 900 ml  Net -900 ml    PHYSICAL EXAMINATION:  GENERAL:  47 y.o.-year-old obese patient lying in the bed with no acute distress.  EYES: Pupils equal, round, reactive to light and accommodation. No scleral icterus. Extraocular muscles intact.  HEENT: Head atraumatic, normocephalic. Oropharynx and nasopharynx clear.  NECK:  Supple,  no jugular venous distention. No thyroid enlargement, no tenderness.  LUNGS: Normal breath sounds bilaterally, no wheezing, rales,rhonchi. No use of accessory muscles of respiration.  CARDIOVASCULAR: S1, S2 normal. No murmurs, rubs, or gallops.  ABDOMEN: Soft, non-tender, non-distended. Bowel sounds present. No organomegaly or mass.  EXTREMITIES: No pedal edema, cyanosis, or clubbing.  NEUROLOGIC: Cranial nerves II through XII are intact. No focal motor or sensory defecits b/l.  PSYCHIATRIC: The patient is alert and oriented x 3. Anxious/nervous.  SKIN: No obvious rash, lesion, or ulcer.   DATA REVIEW:   CBC Recent Labs  Lab 10/03/17 0558  WBC 7.7  HGB 12.3  HCT 38.2  PLT 235    Chemistries  Recent Labs  Lab 10/03/17 0558  NA 137  K 3.9  CL 104  CO2 24  GLUCOSE 124*  BUN 12  CREATININE 0.88  CALCIUM 8.8*    Cardiac Enzymes Recent Labs  Lab 10/02/17 1657  TROPONINI 0.17*    Microbiology Results  No results found for this or any previous visit.  RADIOLOGY:  Dg Chest 2 View  Result Date: 10/02/2017 CLINICAL DATA:  47 year old with tachycardia and dyspnea. EXAM: CHEST  2 VIEW COMPARISON:  12/22/2014 FINDINGS: The heart size and mediastinal contours are within normal limits. Both lungs are clear. The visualized skeletal structures are unremarkable. IMPRESSION: No active cardiopulmonary disease. Electronically Signed   By: Markus Daft M.D.   On: 10/02/2017 17:20   Ct Angio Chest Pe W And/or Wo Contrast  Result Date: 10/02/2017 CLINICAL DATA:  Acute onset of shortness of breath, chest pain, and tachycardia this morning. EXAM: CT ANGIOGRAPHY CHEST WITH CONTRAST TECHNIQUE: Multidetector CT imaging of the chest was performed using the standard protocol during bolus administration of intravenous contrast. Multiplanar CT image reconstructions and MIPs were obtained to evaluate the vascular anatomy. CONTRAST:  42mL ISOVUE-370 IOPAMIDOL (ISOVUE-370) INJECTION 76% COMPARISON:   None. FINDINGS: Cardiovascular: Nonocclusive pulmonary emboli are seen in the right and left pulmonary arteries, and extending into the right upper and bilateral lower lobar and segmental branches. This may be acute or subacute in age. RV/LV ratio measures 1.05, which is considered positive for right heart strain. Mediastinum/Nodes: No masses or pathologically enlarged lymph nodes identified. Lungs/Pleura: No pulmonary mass, infiltrate, or effusion. Upper abdomen: No acute findings. Musculoskeletal: No suspicious bone lesions identified. Review of the MIP images confirms the above findings. IMPRESSION: Positive for acute or subacute  PE with CT evidence of right heart strain (RV/LV Ratio = 1.05) consistent with at least submassive (intermediate risk) PE. The presence of right heart strain has been associated with an increased risk of morbidity and mortality. Please activate Code PE by paging [phone]. Critical Value/emergent results were called by telephone at the time of interpretation on 10/02/2017 at 8:06 pm to Dr. Arta Silence , who verbally acknowledged these results. Electronically Signed   By: Earle Gell M.D.   On: 10/02/2017 20:10   US Venous Img Lower Bilateral  Result Date: 10/03/2017 CLINICAL DATA:  47 year old female with a history of pulmonary emboli EXAM: BILATERAL LOWER EXTREMITY VENOUS DOPPLER ULTRASOUND TECHNIQUE: Gray-scale sonography with graded compression, as well as color Doppler and duplex ultrasound were performed to evaluate the lower extremity deep venous systems from the level of the common femoral vein and including the common femoral, femoral, profunda femoral, popliteal and calf veins including the posterior tibial, peroneal and gastrocnemius veins when visible. The superficial great saphenous vein was also interrogated. Spectral Doppler was utilized to evaluate flow at rest and with distal augmentation maneuvers in the common femoral, femoral and popliteal veins.  COMPARISON:  05/21/2014 FINDINGS: RIGHT LOWER EXTREMITY Common Femoral Vein: No evidence of thrombus. Normal compressibility, respiratory phasicity and response to augmentation. Saphenofemoral Junction: No evidence of thrombus. Normal compressibility and flow on color Doppler imaging. Profunda Femoral Vein: No evidence of thrombus. Normal compressibility and flow on color Doppler imaging. Femoral Vein: No evidence of thrombus. Normal compressibility, respiratory phasicity and response to augmentation. Popliteal Vein: No evidence of thrombus. Normal compressibility, respiratory phasicity and response to augmentation. Calf Veins: No evidence of thrombus. Normal compressibility and flow on color Doppler imaging. Superficial Great Saphenous Vein: No evidence of thrombus. Normal compressibility and flow on color Doppler imaging. Other Findings:  None. LEFT LOWER EXTREMITY Common Femoral Vein: No evidence of thrombus. Normal compressibility, respiratory phasicity and response to augmentation. Saphenofemoral Junction: No evidence of thrombus. Normal compressibility and flow on color Doppler imaging. Profunda Femoral Vein: No evidence of thrombus. Normal compressibility and flow on color Doppler imaging. Femoral Vein: No evidence of thrombus. Normal compressibility, respiratory phasicity and response to augmentation. Popliteal Vein: No evidence of thrombus. Normal compressibility, respiratory phasicity and response to augmentation. Calf Veins: No evidence of thrombus. Normal compressibility and flow on color Doppler imaging. Superficial Great Saphenous Vein: No evidence of thrombus. Normal compressibility and flow on color Doppler imaging. Other Findings:  None. IMPRESSION: Sonographic survey of the bilateral lower extremities negative for DVT Electronically Signed   By: Corrie Mckusick D.O.   On: 10/03/2017 09:01      Management plans discussed with the patient, family and they are in agreement.  CODE STATUS:      Code Status Orders  (From admission, onward)        Start     Ordered   10/02/17 2218  Full code  Continuous     10/02/17 2217    Code Status History    Date Active Date Inactive Code Status Order ID Comments User Context   This patient has a current code status but no historical code status.      TOTAL TIME TAKING CARE OF THIS PATIENT: 40 minutes.    Henreitta Leber M.D on 10/03/2017 at 1:51 PM  Between 7am to 6pm - Pager - [phone]  After 6pm go to [email or web address] - Proofreader  [facility]  Office  [phone]  CC: Primary care physician; System, Pcp  Not In

## 2017-10-03 NOTE — Care Management (Signed)
Patient is for discharge home today.  is not requiring supplemental oxygen.  She is discharging home on Eliquis.  Provided her with 30 day trial coupon.  She verbalizes that she has pharmacy coverage with her medicare plan.

## 2017-10-03 NOTE — Progress Notes (Signed)
*  PRELIMINARY RESULTS* Echocardiogram 2D Echocardiogram has been performed.  Amanda Vasquez 10/03/2017, 10:11 AM

## 2017-10-03 NOTE — Progress Notes (Signed)
Discharged to home with her mother.  Has follow up appointment with Dr. Janese Banks.  Started on Eliquis.  Has already scheduled removal of her IUD.

## 2017-10-03 NOTE — Care Management Important Message (Signed)
Important Message  Patient Details  Name: Amanda Vasquez MRN: [id number] Date of Birth: 1970-10-03   Medicare Important Message Given:  N/A - LOS <3 / Initial given by admissions    Katrina Stack, RN 10/03/2017, 3:04 PM

## 2017-10-03 NOTE — Plan of Care (Signed)
Able to care for herself at home.

## 2017-10-03 NOTE — Progress Notes (Signed)
ANTICOAGULATION CONSULT NOTE - Initial Consult  Pharmacy Consult for apixaban  Indication: PE  Allergies  Allergen Reactions  . Latex Hives    Patient Measurements: Height: 5\' 4"  (162.6 cm) Weight: 225 lb 14.4 oz (102.5 kg) IBW/kg (Calculated) : 54.7 Heparin Dosing Weight:   Vital Signs: Temp: 98 F (36.7 C) (02/12 0906) Temp Source: Oral (02/12 0906) BP: 120/83 (02/12 0906) Pulse Rate: 91 (02/12 0906)  Labs: Recent Labs    10/02/17 1657 10/03/17 0558  HGB 14.1 12.3  HCT 42.8 38.2  PLT 290 235  CREATININE 0.91 0.88  TROPONINI 0.17*  --     Estimated Creatinine Clearance: 92.1 mL/min (by C-G formula based on SCr of 0.88 mg/dL).   Medical History: Past Medical History:  Diagnosis Date  . Acute meniscal tear of left knee   . Allergic rhinitis   . Chronic constipation   . Demyelinating disorder ([place])    brain lesion that touches thalamic  . Diabetes mellitus, type II ([place])    controlled with medication;  . Dyslipidemia   . General weakness    left hand and leg  . Generalized anxiety disorder    mostly controlled  . History of adenomatous polyp of colon   . History of colitis   . Knee pain   . Migraine   . 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 Wears glasses     Medications:  Medications Prior to Admission  Medication Sig Dispense Refill Last Dose  . ALPRAZolam (XANAX) 0.5 MG tablet TAKE 1 TABLET BY MOUTH 4 TIMES DAILY AS NEEDED FOR ANXIETY 120 tablet 1 10/02/2017 at Unknown time  . atorvastatin (LIPITOR) 10 MG tablet TAKE ONE TABLET BY MOUTH EVERY EVENING 90 tablet 3 10/02/2017 at Unknown time  . baclofen (LIORESAL) 10 MG tablet Take 1 tablet (10 mg total) by mouth 3 (three) times daily. 30 each  10/02/2017 at Unknown time  . buPROPion  (WELLBUTRIN XL) 300 MG 24 hr tablet TAKE ONE TABLET EVERY DAY NEED OFFICE VISIT FOR MORE REFILLS 30 tablet 1 10/02/2017 at Unknown time  . Cetirizine HCl 10 MG CAPS Take 1 capsule (10 mg total) by mouth daily as needed. Take by mouth. (Patient taking differently: Take 10 mg by mouth every evening. Take by mouth.) 30 capsule  10/01/2017 at Unknown time  . Cholecalciferol (VITAMIN D3) 2000 UNITS TABS Take 100-2,000 Units by mouth daily.    10/02/2017 at Unknown time  . empagliflozin (JARDIANCE) 25 MG TABS tablet Take 25 mg by mouth daily. 30 tablet 5 10/02/2017 at Unknown time  . glipiZIDE (GLUCOTROL) 5 MG tablet Take 2 tablets per day 180 tablet 0 10/02/2017 at Unknown time  . GRALISE 600 MG TABS Take 1,800 mg by mouth every evening.    10/02/2017 at Unknown time  . metFORMIN (GLUCOPHAGE) 1000 MG tablet TAKE 1 TABLET BY MOUTH TWICE DAILY WITH A MEAL 180 tablet 3 10/02/2017 at Unknown time  . morphine (MS CONTIN) 60 MG 12 hr tablet Take 60 mg by mouth every 12 (twelve) hours.   10/02/2017 at Unknown time  . multivitamin-iron-minerals-folic acid (CENTRUM) chewable tablet Chew 1 tablet by mouth daily.   10/02/2017 at Unknown time  . sertraline (ZOLOFT) 50 MG tablet TAKE 1 TABLET BY MOUTH DAILY 30 tablet 0 10/02/2017 at Unknown time  .  traZODone (DESYREL) 100 MG tablet TAKE 1 TABLET BY MOUTH AT BEDTIME AS NEEDED FOR SLEEP 30 tablet 0 10/01/2017 at Unknown time  . dronabinol (MARINOL) 2.5 MG capsule Take 2.5 mg by mouth daily as needed.  1 PRN at PRN  . metoCLOPramide (REGLAN) 5 MG tablet TAKE 1 TABLET 4 TIMES A DAY BEFORE MEALSAND AT BEDTIME 120 tablet 3 PRN at PRN  . naratriptan (AMERGE) 2.5 MG tablet Take one daily for 4 days. (Patient not taking: Reported on 10/02/2017) 4 tablet 11 Not Taking at Unknown time  . oxyCODONE (OXY IR/ROXICODONE) 5 MG immediate release tablet Take 1 tablet (5 mg total) by mouth every 4 (four) hours as needed for severe pain. Per pain clinic Dr Hardin Negus 30 tablet  PRN at PRN  .  polyethylene glycol (MIRALAX / GLYCOLAX) packet Take 17 g by mouth daily.    PRN at PRN  . promethazine (PHENERGAN) 25 MG tablet Take 1 tablet (25 mg total) by mouth every 8 (eight) hours as needed for nausea or vomiting (migraine). Caution of sedation 20 tablet 0 PRN at PRN  . valACYclovir (VALTREX) 1000 MG tablet Take 1 tablet (1,000 mg total) by mouth daily as needed. Must estab w/new provider for future refills 60 tablet 2 PRN at PRN   Scheduled:  . apixaban  10 mg Oral BID   Followed by  . [START ON 10/10/2017] apixaban  5 mg Oral BID  . atorvastatin  10 mg Oral QPM  . buPROPion  300 mg Oral Daily  . ibuprofen  800 mg Oral BID   And  . famotidine  20 mg Oral BID  . gabapentin  600 mg Oral TID  . insulin aspart  0-5 Units Subcutaneous QHS  . insulin aspart  0-9 Units Subcutaneous TID WC  . loratadine  10 mg Oral Daily  . morphine  60 mg Oral Q12H  . polyethylene glycol  17 g Oral Daily  . sertraline  50 mg Oral Daily  . sodium chloride flush  3 mL Intravenous Q12H    Assessment: Pharmacy consulted to dose and monitor apixaban in this 47 year old woman being treated for PE. Goal of Therapy:   Monitor platelets by anticoagulation protocol: Yes   Plan:  Will start apixaban 10 mg @ 2200 (12 hours after the last enoxaparin dose)  X 14 doses then 5 mg PO BID.   Rameses Ou D 10/03/2017,11:49 AM

## 2017-10-04 ENCOUNTER — Telehealth: Payer: Self-pay

## 2017-10-04 ENCOUNTER — Telehealth: Payer: Self-pay | Admitting: Primary Care

## 2017-10-04 DIAGNOSIS — Z1231 Encounter for screening mammogram for malignant neoplasm of breast: Secondary | ICD-10-CM | POA: Diagnosis not present

## 2017-10-04 LAB — HM PAP SMEAR: HM Pap smear: NEGATIVE

## 2017-10-04 LAB — HIV ANTIBODY (ROUTINE TESTING W REFLEX): HIV Screen 4th Generation wRfx: NONREACTIVE

## 2017-10-04 NOTE — Telephone Encounter (Signed)
Please advise on below  

## 2017-10-04 NOTE — Telephone Encounter (Signed)
Copied from [place] [id number]. Topic: Quick Communication - Rx Refill/Question >> Oct 04, 2017  1:52 PM Scherrie Gerlach wrote: Medication: apixaban (ELIQUIS) 5 MG TABS tablet  Pt dc'd from hospital yesterday afternoon with pulmonary embolism. Discharged with this med. Wants to make sure this is ok to take with her zoloft.  Pt has transfer appointment to [facility] on 2/27, but used to see Dr Deborra Medina. Please advise

## 2017-10-04 NOTE — Telephone Encounter (Signed)
[facility] her Eliquis.   She is between providers.   Was seeing Dr. Deborra Medina but but is in the process establishing care with Allie Bossier at another practice on 10/18/17.  Did not know where to send the refill request.   I called the pt who informed me she got it refilled yesterday.    She has enough pills to get her to her appt with a hematologist next week.  She will address this with the hematologist and Allie Bossier when she establishes care with her on 10/18/17.

## 2017-10-04 NOTE — Telephone Encounter (Signed)
No problems 

## 2017-10-04 NOTE — Telephone Encounter (Signed)
Patient was just discharged from the hospital t=yesterday due to a pulmonary embolism. They put her on a blood thinner (eliquis). She would like to make sure its okay to take it with her current medications.

## 2017-10-05 ENCOUNTER — Encounter: Payer: Medicare Other | Admitting: Oncology

## 2017-10-05 ENCOUNTER — Ambulatory Visit: Payer: Medicare Other | Admitting: Internal Medicine

## 2017-10-05 ENCOUNTER — Encounter: Payer: Self-pay | Admitting: Primary Care

## 2017-10-05 NOTE — Telephone Encounter (Signed)
LVM letting pt know.  

## 2017-10-10 ENCOUNTER — Other Ambulatory Visit: Payer: Self-pay

## 2017-10-10 ENCOUNTER — Inpatient Hospital Stay: Payer: Medicare Other | Attending: Oncology | Admitting: Oncology

## 2017-10-10 ENCOUNTER — Inpatient Hospital Stay: Payer: Medicare Other

## 2017-10-10 ENCOUNTER — Encounter: Payer: Self-pay | Admitting: Oncology

## 2017-10-10 VITALS — BP 109/68 | HR 93 | Temp 98.2°F | Resp 18 | Ht 64.0 in | Wt 224.0 lb

## 2017-10-10 DIAGNOSIS — Z87891 Personal history of nicotine dependence: Secondary | ICD-10-CM | POA: Diagnosis not present

## 2017-10-10 DIAGNOSIS — Z7901 Long term (current) use of anticoagulants: Secondary | ICD-10-CM

## 2017-10-10 DIAGNOSIS — R0781 Pleurodynia: Secondary | ICD-10-CM | POA: Diagnosis not present

## 2017-10-10 DIAGNOSIS — Z86711 Personal history of pulmonary embolism: Secondary | ICD-10-CM

## 2017-10-10 DIAGNOSIS — E1165 Type 2 diabetes mellitus with hyperglycemia: Secondary | ICD-10-CM | POA: Diagnosis not present

## 2017-10-10 DIAGNOSIS — E669 Obesity, unspecified: Secondary | ICD-10-CM | POA: Diagnosis not present

## 2017-10-10 DIAGNOSIS — I2699 Other pulmonary embolism without acute cor pulmonale: Secondary | ICD-10-CM

## 2017-10-10 DIAGNOSIS — I2609 Other pulmonary embolism with acute cor pulmonale: Secondary | ICD-10-CM

## 2017-10-10 NOTE — Progress Notes (Signed)
Hematology/Oncology Consult note [facility] Telephone:([phone] Fax:([phone]  Patient Care Team: System, Pcp Not In as PCP - General Marcial Pacas, MD as Consulting Physician (Neurology) Jovita Gamma, MD as Consulting Physician (Neurosurgery) Roseanne Kaufman, MD as Consulting Physician (Orthopedic Surgery) Nicholaus Bloom, MD (Anesthesiology) Pedro Earls, MD (Sports Medicine) Marjie Skiff, MD (Psychiatry) Saffo, Delcie Roch, MD (Psychology)   Name of the patient: Amanda Vasquez  [id number]  05-16-1971    Reason for referral- new PE   Referring physician- Dr. Verdell Carmine  Date of visit: 10/10/17   History of presenting illness-patient is a 47 year old female with a past medical history significant for anxiety depression, hyperlipidemia, chronic pain syndrome and type 2 diabetes as well as obesity who presented with symptoms of worsening shortness of breath on 10/02/2017.  CT chest showed acute or subacute PE with evidence of right heart strain consistent with at least submassive PE.Doppler of bilateral lower extremities was negative for DVT.  Patient was initially on Lovenox and then switched to Eliquis and discharged.  She has been referred to [place] for further management.  Currently she is tolerating her Eliquis well and reports no symptoms of bleeding.  She does have some pleuritic chest pain which is slowly getting better.  She is not on oxygen.  She was on Mirena IUD prior to her PE and that was subsequently taken out.  She is not on any hormonal birth control at this time.  Patient is adopted and she does not know anything about her family history.  She does not have children.  She has not had any prior history of DVT or PE.  She denies any periods of immobilization or prolonged travel prior to her symptoms  ECOG PS- 1  Pain scale- 6 she has pain in the left side of her body because of thalamic pain syndrome   Review of systems- Review of Systems    Constitutional: Negative for chills, fever, malaise/fatigue and weight loss.  HENT: Negative for congestion, ear discharge and nosebleeds.   Eyes: Negative for blurred vision.  Respiratory: Negative for cough, hemoptysis, sputum production, shortness of breath and wheezing.   Cardiovascular: Negative for chest pain, palpitations, orthopnea and claudication.       Pleuritic chest pain  Gastrointestinal: Negative for abdominal pain, blood in stool, constipation, diarrhea, heartburn, melena, nausea and vomiting.  Genitourinary: Negative for dysuria, flank pain, frequency, hematuria and urgency.  Musculoskeletal: Negative for back pain, joint pain and myalgias.       Generalized left-sided body pain  Skin: Negative for rash.  Neurological: Negative for dizziness, tingling, focal weakness, seizures, weakness and headaches.  Endo/Heme/Allergies: Does not bruise/bleed easily.  Psychiatric/Behavioral: Negative for depression and suicidal ideas. The patient does not have insomnia.     Allergies  Allergen Reactions  . Latex Hives    Patient Active Problem List   Diagnosis Date Noted  . Pulmonary emboli ([place]) 10/02/2017  . Obesity (BMI 30-39.9) 07/24/2017  . Migraine headache 05/17/2017  . Anxiety and depression 01/30/2017  . Dot and blot hemorrhage, left 01/19/2017  . Gastroparesis 06/22/2016  . Fatigue 03/10/2016  . Pre-operative cardiovascular examination 09/21/2015  . 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 CTS (carpal tunnel syndrome) 02/25/2015  . Paresthesias 02/02/2015  . Demyelinating nervous system disease or syndrome ([place]) 01/20/2015  . Demyelinating disease of central nervous system ([place]) 03/01/2012  . Thalamic pain syndrome (hyperesthetic) 09/07/2010  . Hyperlipidemia 05/28/2010  . OBESITY 02/26/2010  . Herpes simplex virus (HSV)  infection 12/21/2009  . ALLERGIC RHINITIS 12/21/2009  . BACK PAIN, THORACIC REGION 11/11/2009  . Type 2 diabetes mellitus with hyperglycemia,  without long-term current use of insulin ([place]) 07/28/2008  . Anxiety state 07/03/2008  . AMENORRHEA 12/17/2007  . DISORDER, BIPOLAR, ATYPICAL MANIC 05/14/2007     Past Medical History:  Diagnosis Date  . Acute meniscal tear of left knee   . Allergic rhinitis   . Chronic constipation   . Demyelinating disorder ([place])    brain lesion that touches thalamic  . Diabetes mellitus, type II ([place])    controlled with medication;  . Dyslipidemia   . General weakness    left hand and leg  . Generalized anxiety disorder    mostly controlled  . History of adenomatous polyp of colon   . History of colitis   . Knee pain   . Migraine   . 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 Wears glasses      Past Surgical History:  Procedure Laterality Date  . BREAST REDUCTION SURGERY Bilateral 1997  . CHOLECYSTECTOMY  1998  . KNEE ARTHROSCOPY Right 1993  . KNEE ARTHROSCOPY WITH MEDIAL MENISECTOMY Left 10/09/2015   Procedure: LEFT KNEE ARTHROSCOPY WITH PARTIAL MEDIAL MENISCECTOMY; PATELLA-FEMORAL CHONDROPLASTY;  Surgeon: Rod Can, MD;  Location: [place];  Service: Orthopedics;  Laterality: Left;  . TRANSTHORACIC ECHOCARDIOGRAM  03-02-2015   normal LVF,  ef 65-70%    Social History   Socioeconomic History  . Marital status: Divorced    Spouse name: Not on file  . Number of children: 0  . Years of education: 12+  . Highest education level: Not on file  Social Needs  . Financial resource strain: Not on file  . Food insecurity - worry: Not on file  . Food insecurity - inability: Not on file  . Transportation needs - medical: Not on file  . Transportation needs - non-medical: Not on file  Occupational History  . Occupation: Therapist, sports- long-term disability   Tobacco  Use  . Smoking status: Former Smoker    Packs/day: 0.25    Years: 20.00    Pack years: 5.00    Types: Cigarettes    Start date: 06/08/1986    Last attempt to quit: 08/22/2010    Years since quitting: 7.1  . Smokeless tobacco: Never Used  Substance and Sexual Activity  . Alcohol use: No    Alcohol/week: 0.0 oz  . Drug use: No  . Sexual activity: Yes    Comment: Mirena IUD placement Dec 2015; Removed on 10/04/17  Other Topics Concern  . Not on file  Social History Narrative   Divorced. Lives at home alone   Caffeine use: 1 cup coffee 3 times per week   Right handed     Family History  Adopted: Yes     Current Outpatient Medications:  .  ALPRAZolam (XANAX) 0.5 MG tablet, TAKE 1 TABLET BY MOUTH 4 TIMES DAILY AS NEEDED FOR ANXIETY, Disp: 120 tablet, Rfl: 1 .  apixaban (ELIQUIS) 5 MG TABS tablet, Take 10 mg PO BID X 7 days then 5 mg PO BID thereafter. (Patient taking differently: 5 mg 2 (two) times daily. ), Disp: 60 tablet, Rfl: 1 .  atorvastatin (LIPITOR) 10 MG tablet, TAKE ONE TABLET BY MOUTH EVERY EVENING, Disp:  90 tablet, Rfl: 3 .  baclofen (LIORESAL) 10 MG tablet, Take 1 tablet (10 mg total) by mouth 3 (three) times daily., Disp: 30 each, Rfl:  .  buPROPion (WELLBUTRIN XL) 300 MG 24 hr tablet, TAKE ONE TABLET EVERY DAY NEED OFFICE VISIT FOR MORE REFILLS, Disp: 30 tablet, Rfl: 1 .  Cetirizine HCl 10 MG CAPS, Take 1 capsule (10 mg total) by mouth daily as needed. Take by mouth. (Patient taking differently: Take 10 mg by mouth every evening. Take by mouth.), Disp: 30 capsule, Rfl:  .  docusate sodium (COLACE) 100 MG capsule, Take 100 mg by mouth 2 (two) times daily., Disp: , Rfl:  .  dronabinol (MARINOL) 2.5 MG capsule, Take 2.5 mg by mouth daily as needed., Disp: , Rfl: 1 .  empagliflozin (JARDIANCE) 25 MG TABS tablet, Take 25 mg by mouth daily., Disp: 30 tablet, Rfl: 5 .  glipiZIDE (GLUCOTROL) 5 MG tablet, Take 2 tablets per day, Disp: 180 tablet, Rfl: 0 .  GRALISE 600 MG TABS,  Take 1,800 mg by mouth every evening. , Disp: , Rfl:  .  metFORMIN (GLUCOPHAGE) 1000 MG tablet, TAKE 1 TABLET BY MOUTH TWICE DAILY WITH A MEAL, Disp: 180 tablet, Rfl: 3 .  metoCLOPramide (REGLAN) 5 MG tablet, TAKE 1 TABLET 4 TIMES A DAY BEFORE MEALSAND AT BEDTIME, Disp: 120 tablet, Rfl: 3 .  morphine (MS CONTIN) 60 MG 12 hr tablet, Take 60 mg by mouth every 12 (twelve) hours., Disp: , Rfl:  .  multivitamin-iron-minerals-folic acid (CENTRUM) chewable tablet, Chew 1 tablet by mouth daily., Disp: , Rfl:  .  oxyCODONE (OXY IR/ROXICODONE) 5 MG immediate release tablet, Take 1 tablet (5 mg total) by mouth every 4 (four) hours as needed for severe pain. Per pain clinic Dr Hardin Negus, Disp: 30 tablet, Rfl:  .  sertraline (ZOLOFT) 50 MG tablet, TAKE 1 TABLET BY MOUTH DAILY, Disp: 30 tablet, Rfl: 0 .  traZODone (DESYREL) 100 MG tablet, TAKE 1 TABLET BY MOUTH AT BEDTIME AS NEEDED FOR SLEEP, Disp: 30 tablet, Rfl: 0 .  valACYclovir (VALTREX) 1000 MG tablet, Take 1 tablet (1,000 mg total) by mouth daily as needed. Must estab w/new provider for future refills, Disp: 60 tablet, Rfl: 2 .  Cholecalciferol (VITAMIN D3) 2000 UNITS TABS, Take 100-2,000 Units by mouth daily. , Disp: , Rfl:    Physical exam:  Vitals:   10/10/17 0918  BP: 109/68  Pulse: 93  Resp: 18  Temp: 98.2 F (36.8 C)  TempSrc: Tympanic  SpO2: 97%  Weight: 224 lb (101.6 kg)  Height: 5\' 4"  (1.626 m)   Physical Exam  Constitutional: She is oriented to person, place, and time.  Patient is obese.  Does not appear to be in any acute distress  HENT:  Head: Normocephalic and atraumatic.  Eyes: EOM are normal. Pupils are equal, round, and reactive to light.  Neck: Normal range of motion.  Cardiovascular: Normal rate, regular rhythm and normal heart sounds.  Pulmonary/Chest: Effort normal and breath sounds normal.  Abdominal: Soft. Bowel sounds are normal.  Musculoskeletal: She exhibits no tenderness.  No bilateral lower extremity swelling    Neurological: She is alert and oriented to person, place, and time.  Skin: Skin is warm and dry.       CMP Latest Ref Rng & Units 10/03/2017  Glucose 65 - 99 mg/dL 124(H)  BUN 6 - 20 mg/dL 12  Creatinine 0.44 - 1.00 mg/dL 0.88  Sodium 135 - 145 mmol/L 137  Potassium 3.5 -  5.1 mmol/L 3.9  Chloride 101 - 111 mmol/L 104  CO2 22 - 32 mmol/L 24  Calcium 8.9 - 10.3 mg/dL 8.8(L)  Total Protein 6.0 - 8.3 g/dL -  Total Bilirubin 0.2 - 1.2 mg/dL -  Alkaline Phos 39 - 117 U/L -  AST 0 - 37 U/L -  ALT 0 - 35 U/L -   CBC Latest Ref Rng & Units 10/03/2017  WBC 3.6 - 11.0 K/uL 7.7  Hemoglobin 12.0 - 16.0 g/dL 12.3  Hematocrit 35.0 - 47.0 % 38.2  Platelets 150 - 440 K/uL 235    No images are attached to the encounter.  Dg Chest 2 View  Result Date: 10/02/2017 CLINICAL DATA:  47 year old with tachycardia and dyspnea. EXAM: CHEST  2 VIEW COMPARISON:  12/22/2014 FINDINGS: The heart size and mediastinal contours are within normal limits. Both lungs are clear. The visualized skeletal structures are unremarkable. IMPRESSION: No active cardiopulmonary disease. Electronically Signed   By: Markus Daft M.D.   On: 10/02/2017 17:20   Ct Angio Chest Pe W And/or Wo Contrast  Result Date: 10/02/2017 CLINICAL DATA:  Acute onset of shortness of breath, chest pain, and tachycardia this morning. EXAM: CT ANGIOGRAPHY CHEST WITH CONTRAST TECHNIQUE: Multidetector CT imaging of the chest was performed using the standard protocol during bolus administration of intravenous contrast. Multiplanar CT image reconstructions and MIPs were obtained to evaluate the vascular anatomy. CONTRAST:  48mL ISOVUE-370 IOPAMIDOL (ISOVUE-370) INJECTION 76% COMPARISON:  None. FINDINGS: Cardiovascular: Nonocclusive pulmonary emboli are seen in the right and left pulmonary arteries, and extending into the right upper and bilateral lower lobar and segmental branches. This may be acute or subacute in age. RV/LV ratio measures 1.05, which is  considered positive for right heart strain. Mediastinum/Nodes: No masses or pathologically enlarged lymph nodes identified. Lungs/Pleura: No pulmonary mass, infiltrate, or effusion. Upper abdomen: No acute findings. Musculoskeletal: No suspicious bone lesions identified. Review of the MIP images confirms the above findings. IMPRESSION: Positive for acute or subacute PE with CT evidence of right heart strain (RV/LV Ratio = 1.05) consistent with at least submassive (intermediate risk) PE. The presence of right heart strain has been associated with an increased risk of morbidity and mortality. Please activate Code PE by paging [phone]. Critical Value/emergent results were called by telephone at the time of interpretation on 10/02/2017 at 8:06 pm to Dr. Arta Silence , who verbally acknowledged these results. Electronically Signed   By: Earle Gell M.D.   On: 10/02/2017 20:10   US Venous Img Lower Bilateral  Result Date: 10/03/2017 CLINICAL DATA:  47 year old female with a history of pulmonary emboli EXAM: BILATERAL LOWER EXTREMITY VENOUS DOPPLER ULTRASOUND TECHNIQUE: Gray-scale sonography with graded compression, as well as color Doppler and duplex ultrasound were performed to evaluate the lower extremity deep venous systems from the level of the common femoral vein and including the common femoral, femoral, profunda femoral, popliteal and calf veins including the posterior tibial, peroneal and gastrocnemius veins when visible. The superficial great saphenous vein was also interrogated. Spectral Doppler was utilized to evaluate flow at rest and with distal augmentation maneuvers in the common femoral, femoral and popliteal veins. COMPARISON:  05/21/2014 FINDINGS: RIGHT LOWER EXTREMITY Common Femoral Vein: No evidence of thrombus. Normal compressibility, respiratory phasicity and response to augmentation. Saphenofemoral Junction: No evidence of thrombus. Normal compressibility and flow on color Doppler  imaging. Profunda Femoral Vein: No evidence of thrombus. Normal compressibility and flow on color Doppler imaging. Femoral Vein: No evidence of thrombus. Normal compressibility,  respiratory phasicity and response to augmentation. Popliteal Vein: No evidence of thrombus. Normal compressibility, respiratory phasicity and response to augmentation. Calf Veins: No evidence of thrombus. Normal compressibility and flow on color Doppler imaging. Superficial Great Saphenous Vein: No evidence of thrombus. Normal compressibility and flow on color Doppler imaging. Other Findings:  None. LEFT LOWER EXTREMITY Common Femoral Vein: No evidence of thrombus. Normal compressibility, respiratory phasicity and response to augmentation. Saphenofemoral Junction: No evidence of thrombus. Normal compressibility and flow on color Doppler imaging. Profunda Femoral Vein: No evidence of thrombus. Normal compressibility and flow on color Doppler imaging. Femoral Vein: No evidence of thrombus. Normal compressibility, respiratory phasicity and response to augmentation. Popliteal Vein: No evidence of thrombus. Normal compressibility, respiratory phasicity and response to augmentation. Calf Veins: No evidence of thrombus. Normal compressibility and flow on color Doppler imaging. Superficial Great Saphenous Vein: No evidence of thrombus. Normal compressibility and flow on color Doppler imaging. Other Findings:  None. IMPRESSION: Sonographic survey of the bilateral lower extremities negative for DVT Electronically Signed   By: Corrie Mckusick D.O.   On: 10/03/2017 09:01    Assessment and plan- Patient is a 47 y.o. female with past medical history significant for obesity, diabetes among other medical problems who presented with an unprovoked bilateral PE with right heart strain in February 2019  No current use of oral contraception.  No past history of DVT or PE and family history is unknown.  Her obesity could be 1 of the contributory factors  towards her PE.  At this time I would recommend lifelong anticoagulation given that she has a seemingly unprovoked bilateral PE with right heart strain.  Eliquis has not been studied in patients more than 200 pounds but given that her weight is close to 225 pounds it would be okay for her to continue Eliquis at this time as long as her kidney functions are stable.  Patient will continue to work on her weight loss  With regards to the etiology of PE I would order a hypercoagulable workup including prothrombin gene mutation, factor V Leiden as well as beta-2 glycoprotein, anticardiolipin antibodies and hex phase testing.  I will not be ordering protein C protein S and Antithrombin III level in the setting of acute PE as well as current use of anticoagulation.  I really need to order these tests after she stops her anticoagulation.  However I do feel that she needs lifelong anticoagulation given her submassive PE and hypercoagulable workup will not change management for this patient at this time  I will see her back in 6 months time  Discussed natural history of PE, risks versus benefits as well as relevant literature on different anticoagulants and rationale for continuing indefinite anticoagulation for her  Thank you for this kind referral and the opportunity to participate in the care of this patient   Visit Diagnosis 1. Other acute pulmonary embolism with acute cor pulmonale ([name])     Dr. Randa Evens, MD, MPH [facility] Pager- [phone] 10/10/2017  12:14 PM

## 2017-10-12 NOTE — Progress Notes (Signed)
Patient ID: Amanda Vasquez, female   DOB: 08/12/1971, 47 y.o.   MRN: [id number]   HPI: Amanda Vasquez is a 47 y.o.-year-old female, returning for follow-up for DM2, dx in ~1997, non-insulin-dependent, uncontrolled, with complications (mild nonproliferative DR).  Last visit 4 months ago.  She was admitted 10/02/2017 for PE. On Eliquis. Still has some SOB/CP, but much improved.  She has a URI.  Last hemoglobin A1c: Lab Results  Component Value Date   HGBA1C 8.9 05/30/2017   HGBA1C 9.7 (H) 01/19/2017   HGBA1C 7.3 (H) 06/22/2016  She has migraines >> steroids tapers.   Pt is on a regimen of: - Metformin 1000 mg 2x a day, with meals - Glipizide 5 >> 10 mg in am - Jardiance 25 mg daily - added 05/2017.  She had one more yeast infections since last visit. She reduced carbs since last visit.  We stopped Bydureon 2 mg weekly as she had Nausea >> after stopping, nausea did not improve.  Pt checks her sugars 1-2x a day >> started to improve after the first week of January - started to change her diet: - am: 150-200s >> 90-120 >> 111-130, 222 - 2h after b'fast: n/c - before lunch: n/c - 2h after lunch: n/c >> 130-140 >> 104-229 - before dinner: n/c >> 55 (delayed dinner)-207 - 2h after dinner: n/c - bedtime: n/c - nighttime: n/c Lowest sugar was 90-120 >> 64 x1 (fasting) >> 55; she has hypoglycemia awareness in the 90s. Highest sugar was 200s >> 154 >> 200s  Glucometer: AccuChek  Pt's meals are: - Breakfast: hot tea, toast  - Lunch: ham and PB sandwich - Dinner: soup , salad, sometimes chicken - Snacks: muffin; no sodas  -No CKD, last BUN/creatinine:  Lab Results  Component Value Date   BUN 12 10/03/2017   BUN 15 10/02/2017   CREATININE 0.88 10/03/2017   CREATININE 0.91 10/02/2017   -+ HL; last set of lipids: Lab Results  Component Value Date   CHOL 166 01/19/2017   HDL 56.60 01/19/2017   LDLCALC 71 01/19/2017   LDLDIRECT 138.8 06/22/2011   TRIG 191.0 (H) 01/19/2017   CHOLHDL 3 01/19/2017  Lipitor 10 mg daily. - last eye exam was on 05/2017: + DR OS (mild nonproliferative DR), improved at last check -D no numbness and tingling in her feet.  Unclear  FH of DM  - pt adopted.  She stopped Frovo-migraine medicine, per neurology. No recent migraines.  ROS: Constitutional: no weight gain/+ weight loss,+ fatigue, no subjective hyperthermia, no subjective hypothermia Eyes: no blurry vision, no xerophthalmia ENT: no sore throat, no nodules palpated in throat, no dysphagia, no odynophagia, no hoarseness Cardiovascular: + CP/+ SOB/no palpitations/no leg swelling Respiratory: + cough/+ SOB/no wheezing Gastrointestinal: + N/no V/no D/+ C/no acid reflux Musculoskeletal: + muscle aches/+ joint aches Skin: no rashes, no hair loss Neurological: no tremors/no numbness/no tingling/no dizziness  I reviewed pt's medications, allergies, PMH, social hx, family hx, and changes were documented in the history of present illness. Otherwise, unchanged from my initial visit note.  Past Medical History:  Diagnosis Date  . Acute meniscal tear of left knee   . Allergic rhinitis   . Chronic constipation   . Demyelinating disorder ([place])    brain lesion that touches thalamic  . Diabetes mellitus, type II ([place])    controlled with medication;  . Dyslipidemia   . General weakness    left hand and leg  . Generalized anxiety disorder    mostly  controlled  . History of adenomatous polyp of colon   . History of colitis   . Knee pain   . Migraine   . 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 Wears glasses    Past Surgical History:  Procedure Laterality Date  . BREAST REDUCTION SURGERY Bilateral 1997  . CHOLECYSTECTOMY  1998  . KNEE ARTHROSCOPY Right 1993  . KNEE  ARTHROSCOPY WITH MEDIAL MENISECTOMY Left 10/09/2015   Procedure: LEFT KNEE ARTHROSCOPY WITH PARTIAL MEDIAL MENISCECTOMY; PATELLA-FEMORAL CHONDROPLASTY;  Surgeon: Rod Can, MD;  Location: [facility];  Service: Orthopedics;  Laterality: Left;  . TRANSTHORACIC ECHOCARDIOGRAM  03-02-2015   normal LVF,  ef 65-70%   Social History   Socioeconomic History  . Marital status: Divorced    Spouse name: Not on file  . Number of children: 0  . Years of education: 12+  . Highest education level: Not on file  Social Needs  . Financial resource strain: Not on file  . Food insecurity - worry: Not on file  . Food insecurity - inability: Not on file  . Transportation needs - medical: Not on file  . Transportation needs - non-medical: Not on file  Occupational History  . Occupation: Therapist, sports- long-term disability   Tobacco Use  . Smoking status: Former Smoker    Packs/day: 0.25    Years: 20.00    Pack years: 5.00    Types: Cigarettes    Start date: 06/08/1986    Last attempt to quit: 08/22/2010    Years since quitting: 7.1  . Smokeless tobacco: Never Used  Substance and Sexual Activity  . Alcohol use: No    Alcohol/week: 0.0 oz  . Drug use: No  . Sexual activity: Yes    Comment: Mirena IUD placement Dec 2015; Removed on 10/04/17  Other Topics Concern  . Not on file  Social History Narrative   Divorced. Lives at home alone   Caffeine use: 1 cup coffee 3 times per week   Right handed   Current Outpatient Medications on File Prior to Visit  Medication Sig Dispense Refill  . ALPRAZolam (XANAX) 0.5 MG tablet TAKE 1 TABLET BY MOUTH 4 TIMES DAILY AS NEEDED FOR ANXIETY 120 tablet 1  . apixaban (ELIQUIS) 5 MG TABS tablet Take 10 mg PO BID X 7 days then 5 mg PO BID thereafter. (Patient taking differently: 5 mg 2 (two) times daily. ) 60 tablet 1  . atorvastatin (LIPITOR) 10 MG tablet TAKE ONE TABLET BY MOUTH EVERY EVENING 90 tablet 3  . baclofen (LIORESAL) 10 MG tablet Take 1 tablet (10  mg total) by mouth 3 (three) times daily. 30 each   . buPROPion (WELLBUTRIN XL) 300 MG 24 hr tablet TAKE ONE TABLET EVERY DAY NEED OFFICE VISIT FOR MORE REFILLS 30 tablet 1  . Cetirizine HCl 10 MG CAPS Take 1 capsule (10 mg total) by mouth daily as needed. Take by mouth. (Patient taking differently: Take 10 mg by mouth every evening. Take by mouth.) 30 capsule   . Cholecalciferol (VITAMIN D3) 2000 UNITS TABS Take 100-2,000 Units by mouth daily.     [name] docusate sodium (COLACE) 100 MG capsule Take 100 mg by mouth 2 (two) times daily.    [name] dronabinol (MARINOL) 2.5 MG capsule Take 2.5 mg by mouth daily  as needed.  1  . empagliflozin (JARDIANCE) 25 MG TABS tablet Take 25 mg by mouth daily. 30 tablet 5  . glipiZIDE (GLUCOTROL) 5 MG tablet Take 2 tablets per day 180 tablet 0  . GRALISE 600 MG TABS Take 1,800 mg by mouth every evening.     . metFORMIN (GLUCOPHAGE) 1000 MG tablet TAKE 1 TABLET BY MOUTH TWICE DAILY WITH A MEAL 180 tablet 3  . metoCLOPramide (REGLAN) 5 MG tablet TAKE 1 TABLET 4 TIMES A DAY BEFORE MEALSAND AT BEDTIME 120 tablet 3  . morphine (MS CONTIN) 60 MG 12 hr tablet Take 60 mg by mouth every 12 (twelve) hours.    . multivitamin-iron-minerals-folic acid (CENTRUM) chewable tablet Chew 1 tablet by mouth daily.    [name] oxyCODONE (OXY IR/ROXICODONE) 5 MG immediate release tablet Take 1 tablet (5 mg total) by mouth every 4 (four) hours as needed for severe pain. Per pain clinic Dr Hardin Negus 30 tablet   . sertraline (ZOLOFT) 50 MG tablet TAKE 1 TABLET BY MOUTH DAILY 30 tablet 0  . traZODone (DESYREL) 100 MG tablet TAKE 1 TABLET BY MOUTH AT BEDTIME AS NEEDED FOR SLEEP 30 tablet 0  . valACYclovir (VALTREX) 1000 MG tablet Take 1 tablet (1,000 mg total) by mouth daily as needed. Must estab w/new provider for future refills 60 tablet 2   No current facility-administered medications on file prior to visit.    Allergies  Allergen Reactions  . Latex Hives   Family History  Adopted: Yes   PE: BP  128/80 (BP Location: Right Arm, Patient Position: Sitting, Cuff Size: Large)   Pulse 100   Resp 20   Ht 5\' 5"  (1.651 m)   Wt 219 lb (99.3 kg)   SpO2 97%   BMI 36.44 kg/m  Wt Readings from Last 3 Encounters:  10/13/17 219 lb (99.3 kg)  10/10/17 224 lb (101.6 kg)  10/02/17 225 lb 14.4 oz (102.5 kg)   Constitutional: overweight, in NAD Eyes: PERRLA, EOMI, no exophthalmos ENT: moist mucous membranes, no thyromegaly, no cervical lymphadenopathy Cardiovascular: tachtcardia, RR, No MRG Respiratory: CTA B Gastrointestinal: abdomen soft, NT, ND, BS+ Musculoskeletal: no deformities, strength intact in all 4 Skin: moist, warm, no rashes Neurological: no tremor with outstretched hands, DTR normal in all 4  ASSESSMENT: 1. DM2, non-insulin-dependent, uncontrolled, without long term complications, but with hyperglycemia  2.  Obesity  3. HL  PLAN:  1. Patient with long-standing, uncontrolled, diabetes, on oral antidiabetic regimen and GLP-1 receptor agonist.  She had yeast infections, treated with Diflucan.  Since last visit, she had nausea and we stopped Bydureon temporarily to see if nausea resolved. At last visit, we also discussed at length about nutrition and I suggested to stop eating after dinner and also other changes in her diet. - At last visit, we also discussed at length about nutrition and I suggested to stop eating after dinner, which is at 7 PM as she was having a lot of snacks in the evening before.  I also suggested other changes to her diet, also. -She is now off Bydureon after she had nausea and we were trying to figure out the etiology.  Her nausea did not improve with stopping Bydureon, and she feels that this was due to the postnasal drip.   - Her sugars started to improve after the first of the year, after she started to make changes in her diet to reduce sweets and carbs in genera. She still has hyperglycemic spikes, without a particular pattern. -  She agrees to restart  her GLP-1 receptor agonist, however, will try low-dose Trulicity for now.  This will also help with weight loss. - Reviewed latest HbA1c, which was 8.9% - today, HbA1c is 8.3% (better) - I suggested to:  Patient Instructions  Please continue: - Metformin 1000 mg 2x a day, with meals - Glipizide 10 mg in am, before b'fast - Jardiance 25 mg daily in a.m.  Please start Trulicity 3.15 mg weekly. Let me know when you are close to running out to call in the higher dose to your pharmacy (1.5 mg).  Please return in 3 months with your sugar log.   - continue checking sugars at different times of the day - check 1-2x a day, rotating checks - advised for yearly eye exams >> she is UTD - Return to clinic in 3 mo with sugar log   2. Obesity -She had a significant weight loss of 12 pounds after starting Jardiance.  We continued the medication.  She lost more weight since last visit. -We will also start Trulicity, which should also help with weight loss  3. HL -Reviewed lipid panel from 12/2016: LDL greatly improved -Continues Lipitor low dose without side effects  Philemon Kingdom, MD PhD [facility]

## 2017-10-13 ENCOUNTER — Other Ambulatory Visit: Payer: Self-pay | Admitting: Family Medicine

## 2017-10-13 ENCOUNTER — Ambulatory Visit: Payer: Medicare Other | Admitting: Internal Medicine

## 2017-10-13 ENCOUNTER — Encounter: Payer: Self-pay | Admitting: Internal Medicine

## 2017-10-13 VITALS — BP 128/80 | HR 100 | Resp 20 | Ht 65.0 in | Wt 219.0 lb

## 2017-10-13 DIAGNOSIS — E785 Hyperlipidemia, unspecified: Secondary | ICD-10-CM

## 2017-10-13 DIAGNOSIS — E1165 Type 2 diabetes mellitus with hyperglycemia: Secondary | ICD-10-CM | POA: Diagnosis not present

## 2017-10-13 DIAGNOSIS — Z6836 Body mass index (BMI) 36.0-36.9, adult: Secondary | ICD-10-CM | POA: Diagnosis not present

## 2017-10-13 LAB — POCT GLYCOSYLATED HEMOGLOBIN (HGB A1C): Hemoglobin A1C: 8.3

## 2017-10-13 MED ORDER — GLIPIZIDE 5 MG PO TABS: ORAL_TABLET | ORAL | 3 refills | Status: DC

## 2017-10-13 MED ORDER — DULAGLUTIDE 0.75 MG/0.5ML ~~LOC~~ SOAJ: SUBCUTANEOUS | 5 refills | Status: DC

## 2017-10-13 NOTE — Patient Instructions (Addendum)
Please continue: - Metformin 1000 mg 2x a day, with meals - Glipizide 10 mg in am, before b'fast - Jardiance 25 mg daily in a.m.  Please start Trulicity 8.54 mg weekly. Let me know when you are close to running out to call in the higher dose to your pharmacy (1.5 mg).  Please return in 3 months with your sugar log.

## 2017-10-18 ENCOUNTER — Telehealth: Payer: Self-pay | Admitting: *Deleted

## 2017-10-18 ENCOUNTER — Ambulatory Visit: Payer: Medicare Other | Admitting: Primary Care

## 2017-10-18 NOTE — Telephone Encounter (Signed)
-----   Message from Sindy Guadeloupe, MD sent at 10/12/2017  8:01 AM EST ----- Need to repeat hex phase in 3 months. Thanks, Amanda Vasquez

## 2017-10-18 NOTE — Telephone Encounter (Signed)
Called pt and let her know that the hex phase was elevated and needed to be redrawn in 3 months. She is ok with that and made appt for 5/17 2:15 and then she asked what it meant and I do not know but would speak to Janese Banks and call her in am to let her know. She is agreeable to plan

## 2017-10-19 ENCOUNTER — Encounter: Payer: Self-pay | Admitting: Internal Medicine

## 2017-10-19 ENCOUNTER — Ambulatory Visit: Payer: Medicare Other | Admitting: Internal Medicine

## 2017-10-19 ENCOUNTER — Telehealth: Payer: Self-pay | Admitting: *Deleted

## 2017-10-19 VITALS — BP 102/62 | HR 106 | Temp 98.6°F | Ht 65.0 in | Wt 217.4 lb

## 2017-10-19 DIAGNOSIS — F419 Anxiety disorder, unspecified: Secondary | ICD-10-CM

## 2017-10-19 DIAGNOSIS — E1165 Type 2 diabetes mellitus with hyperglycemia: Secondary | ICD-10-CM | POA: Diagnosis not present

## 2017-10-19 DIAGNOSIS — F32A Depression, unspecified: Secondary | ICD-10-CM

## 2017-10-19 DIAGNOSIS — I2609 Other pulmonary embolism with acute cor pulmonale: Secondary | ICD-10-CM

## 2017-10-19 DIAGNOSIS — F329 Major depressive disorder, single episode, unspecified: Secondary | ICD-10-CM | POA: Diagnosis not present

## 2017-10-19 DIAGNOSIS — N939 Abnormal uterine and vaginal bleeding, unspecified: Secondary | ICD-10-CM | POA: Insufficient documentation

## 2017-10-19 DIAGNOSIS — R000 Tachycardia, unspecified: Secondary | ICD-10-CM

## 2017-10-19 DIAGNOSIS — N644 Mastodynia: Secondary | ICD-10-CM | POA: Diagnosis not present

## 2017-10-19 DIAGNOSIS — F411 Generalized anxiety disorder: Secondary | ICD-10-CM | POA: Diagnosis not present

## 2017-10-19 NOTE — Patient Instructions (Signed)
Referred to [facility]  F/u in 2 months  Call OB/GYN need 3d diagnostic mammo bilateral and let them know about bleeding  Take care   Generalized Anxiety Disorder, Adult Generalized anxiety disorder (GAD) is a mental health disorder. People with this condition constantly worry about everyday events. Unlike normal anxiety, worry related to GAD is not triggered by a specific event. These worries also do not fade or get better with time. GAD interferes with life functions, including relationships, work, and school. GAD can vary from mild to severe. People with severe GAD can have intense waves of anxiety with physical symptoms (panic attacks). What are the causes? The exact cause of GAD is not known. What increases the risk? This condition is more likely to develop in:  Women.  People who have a family history of anxiety disorders.  People who are very shy.  People who experience very stressful life events, such as the death of a loved one.  People who have a very stressful family environment.  What are the signs or symptoms? People with GAD often worry excessively about many things in their lives, such as their health and family. They may also be overly concerned about:  Doing well at work.  Being on time.  Natural disasters.  Friendships.  Physical symptoms of GAD include:  Fatigue.  Muscle tension or having muscle twitches.  Trembling or feeling shaky.  Being easily startled.  Feeling like your heart is pounding or racing.  Feeling out of breath or like you cannot take a deep breath.  Having trouble falling asleep or staying asleep.  Sweating.  Nausea, diarrhea, or irritable bowel syndrome (IBS).  Headaches.  Trouble concentrating or remembering facts.  Restlessness.  Irritability.  How is this diagnosed? Your health care provider can diagnose GAD based on your symptoms and medical history. You will also have a physical exam. The  health care provider will ask specific questions about your symptoms, including how severe they are, when they started, and if they come and go. Your health care provider may ask you about your use of alcohol or drugs, including prescription medicines. Your health care provider may refer you to a mental health specialist for further evaluation. Your health care provider will do a thorough examination and may perform additional tests to rule out other possible causes of your symptoms. To be diagnosed with GAD, a person must have anxiety that:  Is out of his or her control.  Affects several different aspects of his or her life, such as work and relationships.  Causes distress that makes him or her unable to take part in normal activities.  Includes at least three physical symptoms of GAD, such as restlessness, fatigue, trouble concentrating, irritability, muscle tension, or sleep problems.  Before your health care provider can confirm a diagnosis of GAD, these symptoms must be present more days than they are not, and they must last for six months or longer. How is this treated? The following therapies are usually used to treat GAD:  Medicine. Antidepressant medicine is usually prescribed for long-term daily control. Antianxiety medicines may be added in severe cases, especially when panic attacks occur.  Talk therapy (psychotherapy). Certain types of talk therapy can be helpful in treating GAD by providing support, education, and guidance. Options include: ? Cognitive behavioral therapy (CBT). People learn coping skills and techniques to ease their anxiety. They learn to identify unrealistic or negative thoughts and behaviors and to replace them with positive ones. ?  Acceptance and commitment therapy (ACT). This treatment teaches people how to be mindful as a way to cope with unwanted thoughts and feelings. ? Biofeedback. This process trains you to manage your body's response (physiological  response) through breathing techniques and relaxation methods. You will work with a therapist while machines are used to monitor your physical symptoms.  Stress management techniques. These include yoga, meditation, and exercise.  A mental health specialist can help determine which treatment is best for you. Some people see improvement with one type of therapy. However, other people require a combination of therapies. Follow these instructions at home:  Take over-the-counter and prescription medicines only as told by your health care provider.  Try to maintain a normal routine.  Try to anticipate stressful situations and allow extra time to manage them.  Practice any stress management or self-calming techniques as taught by your health care provider.  Do not punish yourself for setbacks or for not making progress.  Try to recognize your accomplishments, even if they are small.  Keep all follow-up visits as told by your health care provider. This is important. Contact a health care provider if:  Your symptoms do not get better.  Your symptoms get worse.  You have signs of depression, such as: ? A persistently sad, cranky, or irritable mood. ? Loss of enjoyment in activities that used to bring you joy. ? Change in weight or eating. ? Changes in sleeping habits. ? Avoiding friends or family members. ? Loss of energy for normal tasks. ? Feelings of guilt or worthlessness. Get help right away if:  You have serious thoughts about hurting yourself or others. If you ever feel like you may hurt yourself or others, or have thoughts about taking your own life, get help right away. You can go to your nearest emergency department or call:  Your local emergency services (911 in the [place]  A suicide crisis helpline, such as the [facility] at 619-845-8782. This is open 24 hours a day.  Summary  Generalized anxiety disorder (GAD) is a mental health disorder  that involves worry that is not triggered by a specific event.  People with GAD often worry excessively about many things in their lives, such as their health and family.  GAD may cause physical symptoms such as restlessness, trouble concentrating, sleep problems, frequent sweating, nausea, diarrhea, headaches, and trembling or muscle twitching.  A mental health specialist can help determine which treatment is best for you. Some people see improvement with one type of therapy. However, other people require a combination of therapies. This information is not intended to replace advice given to you by your health care provider. Make sure you discuss any questions you have with your health care provider. Document Released: 12/03/2012 Document Revised: 06/28/2016 Document Reviewed: 06/28/2016 Elsevier Interactive Patient Education  [employer].

## 2017-10-19 NOTE — Progress Notes (Signed)
Pre visit review using our clinic review tool, if applicable. No additional management support is needed unless otherwise documented below in the visit note. 

## 2017-10-19 NOTE — Telephone Encounter (Signed)
I called pt back after speaking to Dr. Janese Banks about the result of hexagonal phospholipid level. Dr. Janese Banks said that most of the time the level is falsely positive because she was on blood thinner at the time of it getting drawn.  Dr. Janese Banks had told her that the test can come back positive falsely because she was on blood thinner but her PE was very recent and she was too high risk to come off blood thinner to test it being off blood thinners.  The other 2 test that were neg. Is a good sign. But that is why she will need her lab redone in 3 months and hopefully she will be off blood thinner and if she is not then we may need to wait a little longer to make sure she is not on blood thinner. The test if positive means she has an antibody that can make her at inc. Risk of more blood clots.  I asked her to call me if she has questions and left my number to call me back.

## 2017-10-22 ENCOUNTER — Encounter: Payer: Self-pay | Admitting: Internal Medicine

## 2017-10-22 NOTE — Progress Notes (Signed)
Chief Complaint  Patient presents with  . Follow-up    transfer from Dr. Deborra Medina   Establish care transfer from [facility]  1. H/o 10/02/17 submassive PE with right heart strain she saw H/o who rec Eliquis 5 mg bid and did hypercoagulable w/u. She is a former smoker quit 2012 and does report she had 2 IUDs over 8 years and 10/04/16 she had IUD removed Mirena IUD and she was not smoking on this IUD. At one point she was also on Marinol since 02/2016 which can cause PE/DVT  2. C/o uncontrolled anxiety/insomnia prev saw [facility] but her psychiatrist left. She is on Xanax 0.5 qid prn long term, Wellbutrin XL 300 mg qd, Zoloft 50 mg qd, Trazadone 100 mg qd. Triggers to anxiety is her health  3. She c/o b/l breast pain above nipples at 12 oclock she has mammo sch 11/15/17 but rec she get b/l dx mammo and [place] will forward note to [facility]   4. Since IUD removal 10/04/17 c/o GU bleeding using 3 pads per day and has red blood and also clots advised pt to contact OB/GYN but could be 2/2 being on eliquis 5 mg bid as well  5. HR 106 repeat manual 100 6. DM 2 following with Endocrine last A1C 8.3 she is on Trulicity 9.76 mg weekly, Jardiance 25 mg qd, She was on Glipizide 10 mg qam and 5 mg qam but was having lows of 43, 65, and 69 cbgs with sx's so d/c glipizide.     Review of Systems  Constitutional:       Down 2 lbs   HENT: Negative for hearing loss.   Eyes: Negative for blurred vision.  Respiratory: Negative for shortness of breath.   Cardiovascular: Positive for palpitations. Negative for chest pain.  Gastrointestinal: Negative for abdominal pain.  Musculoskeletal:       +chronic pain   Skin: Negative for rash.  Neurological: Negative for headaches.  Endo/Heme/Allergies:       No more low cbgs since stopped glipizide   Psychiatric/Behavioral: The patient is nervous/anxious.    Past Medical History:  Diagnosis Date  . Acute meniscal tear of left knee   .  Allergic rhinitis   . Chronic constipation   . Demyelinating disorder ([place])    brain lesion that touches thalamic  . Diabetes mellitus, type II ([place])    controlled with medication;  . Dyslipidemia   . General weakness    left hand and leg  . Generalized anxiety disorder    mostly controlled  . History of adenomatous polyp of colon   . History of colitis   . Knee pain   . Migraine   . 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 Wears glasses    Past Surgical History:  Procedure Laterality Date  . BREAST REDUCTION SURGERY Bilateral 1997  . CHOLECYSTECTOMY  1998  . KNEE ARTHROSCOPY Right 1993  . KNEE ARTHROSCOPY WITH MEDIAL MENISECTOMY Left 10/09/2015   Procedure: LEFT KNEE ARTHROSCOPY WITH PARTIAL MEDIAL MENISCECTOMY; PATELLA-FEMORAL CHONDROPLASTY;  Surgeon: Rod Can, MD;  Location: [facility];  Service: Orthopedics;  Laterality: Left;  . TRANSTHORACIC ECHOCARDIOGRAM  03-02-2015   normal LVF,  ef 65-70%   Family History  Adopted: Yes   Social History  Socioeconomic History  . Marital status: Divorced    Spouse name: Not on file  . Number of children: 0  . Years of education: 12+  . Highest education level: Not on file  Social Needs  . Financial resource strain: Not on file  . Food insecurity - worry: Not on file  . Food insecurity - inability: Not on file  . Transportation needs - medical: Not on file  . Transportation needs - non-medical: Not on file  Occupational History  . Occupation: Therapist, sports- long-term disability   Tobacco Use  . Smoking status: Former Smoker    Packs/day: 0.25    Years: 20.00    Pack years: 5.00    Types: Cigarettes    Start date: 06/08/1986    Last attempt to quit: 08/22/2010    Years since quitting: 7.1  . Smokeless  tobacco: Never Used  Substance and Sexual Activity  . Alcohol use: No    Alcohol/week: 0.0 oz  . Drug use: No  . Sexual activity: Yes    Comment: Mirena IUD placement Dec 2015; Removed on 10/04/17  Other Topics Concern  . Not on file  Social History Narrative   Divorced. Lives at home alone   Caffeine use: 1 cup coffee 3 times per week   Right handed   Former Therapist, sports   Current Meds  Medication Sig  . ALPRAZolam (XANAX) 0.5 MG tablet TAKE ONE TABLET BY MOUTH FOUR TIMES DAILY AS NEEDED FOR ANXIETY  . apixaban (ELIQUIS) 5 MG TABS tablet Take 10 mg PO BID X 7 days then 5 mg PO BID thereafter. (Patient taking differently: 5 mg 2 (two) times daily. )  . atorvastatin (LIPITOR) 10 MG tablet TAKE ONE TABLET BY MOUTH EVERY EVENING  . baclofen (LIORESAL) 10 MG tablet Take 1 tablet (10 mg total) by mouth 3 (three) times daily.  [name] buPROPion (WELLBUTRIN XL) 300 MG 24 hr tablet TAKE ONE TABLET EVERY DAY NEED OFFICE VISIT FOR MORE REFILLS  . Cetirizine HCl 10 MG CAPS Take 1 capsule (10 mg total) by mouth daily as needed. Take by mouth. (Patient taking differently: Take 10 mg by mouth every evening. Take by mouth.)  . docusate sodium (COLACE) 100 MG capsule Take 100 mg by mouth 2 (two) times daily.  [name] dronabinol (MARINOL) 2.5 MG capsule Take 2.5 mg by mouth daily as needed.  . Dulaglutide (TRULICITY) 1.88 CZ/6.6AY SOPN Inject under skin weekly in am  . empagliflozin (JARDIANCE) 25 MG TABS tablet Take 25 mg by mouth daily.  [name] glipiZIDE (GLUCOTROL) 5 MG tablet Take 2 tablets before b'fast  . GRALISE 600 MG TABS Take 1,800 mg by mouth every evening.   . metFORMIN (GLUCOPHAGE) 1000 MG tablet TAKE 1 TABLET BY MOUTH TWICE DAILY WITH A MEAL  . metoCLOPramide (REGLAN) 5 MG tablet TAKE 1 TABLET 4 TIMES A DAY BEFORE MEALSAND AT BEDTIME  . morphine (MS CONTIN) 60 MG 12 hr tablet Take 60 mg by mouth every 12 (twelve) hours.  . multivitamin-iron-minerals-folic acid (CENTRUM) chewable tablet Chew 1 tablet by mouth daily.   [name] oxyCODONE (OXY IR/ROXICODONE) 5 MG immediate release tablet Take 1 tablet (5 mg total) by mouth every 4 (four) hours as needed for severe pain. Per pain clinic Dr Hardin Negus  . sertraline (ZOLOFT) 50 MG tablet TAKE 1 TABLET BY MOUTH DAILY  . traZODone (DESYREL) 100 MG tablet TAKE 1 TABLET BY MOUTH AT BEDTIME AS NEEDED FOR SLEEP  . valACYclovir (VALTREX) 1000 MG tablet Take 1 tablet (1,000  mg total) by mouth daily as needed. Must estab w/new provider for future refills   Allergies  Allergen Reactions  . Latex Hives   Recent Results (from the past 2160 hour(s))  Basic metabolic panel     Status: Abnormal   Collection Time: 10/02/17  4:57 PM  Result Value Ref Range   Sodium 137 135 - 145 mmol/L   Potassium 4.2 3.5 - 5.1 mmol/L   Chloride 103 101 - 111 mmol/L   CO2 24 22 - 32 mmol/L   Glucose, Bld 128 (H) 65 - 99 mg/dL   BUN 15 6 - 20 mg/dL   Creatinine, Ser 0.91 0.44 - 1.00 mg/dL   Calcium 9.4 8.9 - 10.3 mg/dL   GFR calc non Af Amer >60 >60 mL/min   GFR calc Af Amer >60 >60 mL/min    Comment: (NOTE) The eGFR has been calculated using the CKD EPI equation. This calculation has not been validated in all clinical situations. eGFR's persistently <60 mL/min signify possible Chronic Kidney Disease.    Anion gap 10 5 - 15    Comment: Performed at [facility], [address] [place], [place] [address]  CBC     Status: Abnormal   Collection Time: 10/02/17  4:57 PM  Result Value Ref Range   WBC 10.4 3.6 - 11.0 K/uL   RBC 5.26 (H) 3.80 - 5.20 MIL/uL   Hemoglobin 14.1 12.0 - 16.0 g/dL   HCT 42.8 35.0 - 47.0 %   MCV 81.3 80.0 - 100.0 fL   MCH 26.8 26.0 - 34.0 pg   MCHC 33.0 32.0 - 36.0 g/dL   RDW 15.2 (H) 11.5 - 14.5 %   Platelets 290 150 - 440 K/uL    Comment: Performed at [facility], [address] [place], [place] [address]  Troponin I     Status: Abnormal   Collection Time: 10/02/17  4:57 PM  Result Value Ref Range   Troponin I 0.17 (HH) <0.03 ng/mL     Comment: CRITICAL RESULT CALLED TO, READ BACK BY AND VERIFIED WITH MONICA MOON ON 10/02/17 AT 1807 [facility] Performed at [facility], [address] [place], [place] [address]   Glucose, capillary     Status: Abnormal   Collection Time: 10/02/17 10:11 PM  Result Value Ref Range   Glucose-Capillary 150 (H) 65 - 99 mg/dL  HIV antibody (Routine Testing)     Status: None   Collection Time: 10/02/17 10:16 PM  Result Value Ref Range   HIV Screen 4th Generation wRfx Non Reactive Non Reactive    Comment: (NOTE) Performed At: [facility] [place], [place] [id number] Rush Farmer MD [phone] Performed at [facility], [address] [place], [place] [address]   Basic metabolic panel     Status: Abnormal   Collection Time: 10/03/17  5:58 AM  Result Value Ref Range   Sodium 137 135 - 145 mmol/L   Potassium 3.9 3.5 - 5.1 mmol/L   Chloride 104 101 - 111 mmol/L   CO2 24 22 - 32 mmol/L   Glucose, Bld 124 (H) 65 - 99 mg/dL   BUN 12 6 - 20 mg/dL   Creatinine, Ser 0.88 0.44 - 1.00 mg/dL   Calcium 8.8 (L) 8.9 - 10.3 mg/dL   GFR calc non Af Amer >60 >60 mL/min   GFR calc Af Amer >60 >60 mL/min    Comment: (NOTE) The eGFR has been calculated using the CKD EPI equation. This  calculation has not been validated in all clinical situations. eGFR's persistently <60 mL/min signify possible Chronic Kidney Disease.    Anion gap 9 5 - 15    Comment: Performed at [facility], [address] [place], [place] [address]  CBC     Status: Abnormal   Collection Time: 10/03/17  5:58 AM  Result Value Ref Range   WBC 7.7 3.6 - 11.0 K/uL   RBC 4.62 3.80 - 5.20 MIL/uL   Hemoglobin 12.3 12.0 - 16.0 g/dL   HCT 38.2 35.0 - 47.0 %   MCV 82.5 80.0 - 100.0 fL   MCH 26.7 26.0 - 34.0 pg   MCHC 32.3 32.0 - 36.0 g/dL   RDW 15.2 (H) 11.5 - 14.5 %   Platelets 235 150 - 440 K/uL    Comment: Performed at [facility], [address] [place], [place]  [address]  Glucose, capillary     Status: Abnormal   Collection Time: 10/03/17  9:10 AM  Result Value Ref Range   Glucose-Capillary 156 (H) 65 - 99 mg/dL  ECHOCARDIOGRAM COMPLETE     Status: None   Collection Time: 10/03/17 10:11 AM  Result Value Ref Range   Weight 3,614.4 oz   Height 64 in   BP 120/83 mmHg  Glucose, capillary     Status: Abnormal   Collection Time: 10/03/17 11:49 AM  Result Value Ref Range   Glucose-Capillary 229 (H) 65 - 99 mg/dL   Comment 1 Notify RN   Factor 5 leiden     Status: None   Collection Time: 10/10/17 10:40 AM  Result Value Ref Range   Recommendations-F5LEID: Comment     Comment: (NOTE) Result:  Negative (no mutation found) Factor V Leiden is a specific mutation (R506Q) in the factor V gene that is associated with an increased risk of venous thrombosis. Factor V Leiden is more resistant to inactivation by activated protein C.  As a result, factor V persists in the circulation leading to a mild hyper- coagulable state.  The Leiden mutation accounts for 90% - 95% of APC resistance.  Factor V Leiden has been reported in patients with deep vein thrombosis, pulmonary embolus, central retinal vein occlusion, cerebral sinus thrombosis and hepatic vein thrombosis. Other risk factors to be considered in the workup for venous thrombosis include the G20210A mutation in the factor II (prothrombin) gene, protein S and C deficiency, and antithrombin deficiencies. Anticardiolipin antibody and lupus anticoagulant analysis may be appropriate for certain patients, as well as homocysteine levels. Contact your local [facility] for information on how to order additi onal testing if desired. **Genetic counselors are available for health care providers to**  discuss results at 1-800-345-GENE [phone]). Methodology: DNA analysis of the Factor V gene was performed by allele-specific PCR. The diagnostic sensitivity and specificity is >99% for both. Molecular-based testing is  highly accurate, but as in any laboratory test, diagnostic errors may occur. All test results must be combined with clinical information for the most accurate interpretation. This test was developed and its performance characteristics determined by [facility]. It has not been cleared or approved by the Food and Drug Administration. References: Voelkerding K (1996).  Clin Lab Med [phone]. Allison Quarry, PhD, [facility] Ruben Reason, PhD, [facility] Annetta Maw, M.S., PhD, [facility] Alfredo Bach, PhD, [facility] Norva Riffle, PhD, [facility] Earlean Polka PhD, [facility] Performed At: [facility] [address] [place], [place] [id number] Nechama Guard MD [id number] Performed at [facility], [address] [place], [place] [address]   Prothrombin gene mutation     Status: None   Collection Time: 10/10/17 10:40 AM  Result Value Ref Range   Recommendations-PTGENE: Comment     Comment: (NOTE) NEGATIVE No mutation identified. Comment: A point mutation (G20210A) in the factor II (prothrombin) gene is the second most common cause of inherited thrombophilia. The incidence of this mutation in the [place] Caucasian population is about 2% and in the [place] American population it is approximately 0.5%. This mutation is rare in the [place] and Native American population. Being heterozygous for a prothrombin mutation increases the risk for developing venous thrombosis about 2 to 3 times above the general population risk. Being homozygous for the prothrombin gene mutation increases the relative risk for venous thrombosis further, although it is not yet known how much further the risk is increased. In women heterozygous for the prothrombin gene mutation, the use of estrogen containing oral contraceptives increases the relative risk of venous thrombosis about 16 times and the risk of developing cerebral thrombosis is also significantly increased. In pregnancy the pr othrombin gene  mutation increases risk for venous thrombosis and may increase risk for stillbirth, placental abruption, pre-eclampsia and fetal growth restriction. If the patient possesses two or more congenital or acquired thrombophilic risk factors, the risk for thrombosis may rise to more than the sum of the risk ratios for the individual mutations. This assay detects only the prothrombin G20210A mutation and does not measure genetic abnormalities elsewhere in the genome. Other thrombotic risk factors may be pursued through systematic clinical laboratory analysis. These factors include the R506Q (Leiden) mutation in the Factor V gene, plasma homocysteine levels, as well as testing for deficiencies of antithrombin III, protein C and protein S. Genetic Counselors are available for health care providers to discuss results at [phone]-GENE [phone]). Methodology: DNA analysis of the Factor II gene was performed by PCR amplification followed by restriction analysis. The di agnostic sensitivity is >99% for both. All the tests must be combined with clinical information for the most accurate interpretation. Molecular-based testing is highly accurate, but as in any laboratory test, diagnostic errors may occur. This test was developed and its performance characteristics determined by [facility]. It has not been cleared or approved by the Food and Drug Administration. Poort SR, et al. Blood. 1996; [id number]. Varga EA. Circulation. 2004; 539:J67-H41. Mervin Hack, et [place]; 19:700-703. Allison Quarry, PhD, [facility] Ruben Reason, PhD, [facility] Annetta Maw, M.S., PhD, [facility] Alfredo Bach, PhD, [facility] Norva Riffle, PhD, [facility] Earlean Polka, PhD, [facility] Performed At: [facility] [place], [place] [id number] Nechama Guard MD [id number] Performed at [facility], [place], [place] [address]   Cardiolipin antibodies, IgG,  IgM, IgA     Status: None   Collection Time: 10/10/17 10:40 AM  Result Value Ref Range   Anticardiolipin IgG <9 0 - 14 GPL U/mL    Comment: (NOTE)                          Negative:              <15                          Indeterminate:     15 - 20  Low-Med Positive: >20 - 80                          High Positive:         >80    Anticardiolipin IgM <9 0 - 12 MPL U/mL    Comment: (NOTE)                          Negative:              <13                          Indeterminate:     13 - 20                          Low-Med Positive: >20 - 80                          High Positive:         >80    Anticardiolipin IgA <9 0 - 11 APL U/mL    Comment: (NOTE)                          Negative:              <12                          Indeterminate:     12 - 20                          Low-Med Positive: >20 - 80                          High Positive:         >80 Performed At: [facility] [place], [place] [id number] Rush Farmer MD [phone] Performed at [facility], [place], [place] [address]   Hexagonal Phase Phospholipid     Status: Abnormal   Collection Time: 10/10/17 10:40 AM  Result Value Ref Range   Hex Phosph Neut Test 24 (H) 0 - 11 sec    Comment: (NOTE) **Verified by repeat analysis** Performed At: [facility] [place], [place] [id number] Rush Farmer MD [id number] Performed at [facility], [address] [place], [place] [address]   POCT glycosylated hemoglobin (Hb A1C)     Status: Abnormal   Collection Time: 10/13/17 11:50 AM  Result Value Ref Range   Hemoglobin A1C 8.3    Objective  Body mass index is 36.18 kg/m. Wt Readings from Last 3 Encounters:  10/19/17 217 lb 6.4 oz (98.6 kg)  10/13/17 219 lb (99.3 kg)  10/10/17 224 lb (101.6 kg)   Temp Readings from Last 3 Encounters:  10/19/17 98.6 F (37 C) (Oral)  10/10/17 98.2 F (36.8 C)  (Tympanic)  10/03/17 98 F (36.7 C) (Oral)   BP Readings from Last 3 Encounters:  10/19/17 102/62  10/13/17 128/80  10/10/17 109/68   Pulse Readings from Last 3 Encounters:  10/19/17 (!) 106  10/13/17 100  10/10/17 93   O2 sat room air 98%   Physical Exam  Constitutional: She is oriented to person, place,  and time and well-developed, well-nourished, and in no distress.  HENT:  Head: Normocephalic and atraumatic.  Mouth/Throat: Oropharynx is clear and moist and mucous membranes are normal.  Eyes: Conjunctivae are normal. Pupils are equal, round, and reactive to light.  Cardiovascular: Regular rhythm and normal heart sounds. Tachycardia present.  Manual pulse recheck 100   Pulmonary/Chest: Effort normal and breath sounds normal. Right breast exhibits tenderness. Right breast exhibits no inverted nipple, no mass, no nipple discharge and no skin change. Left breast exhibits tenderness. Left breast exhibits no inverted nipple, no mass, no nipple discharge and no skin change. Breasts are symmetrical.    Neurological: She is alert and oriented to person, place, and time. Gait normal. Gait normal.  Skin: Skin is warm, dry and intact.  Psychiatric: Mood, memory, affect and judgment normal.  Nursing note and vitals reviewed.   Assessment   1. Submassive PE with right heart strain risk factors ? marinol use, IUD s/p removal 10/04/17  2. Uncontrolled anxiety, h/o depression h/o insomnia  3. B/l breast pain at 12 oclock b/l breasts  4. GU bleeding since IUD removal 10/04/17  5. Sinus tachycardia  6. DM 2 A1C 8.3 10/13/17 with h/o hypoglycemia  7. HM  Plan  1.  F/u H/o  Cont Eliquis 5 mg bid  2.  Refer to [facility]  Temp refill xanax 0.5 qid prn will defer further tx anxiety and insomnia to psychiatry  Cont other meds  3. Pt needs b/l 3d diagnostic mammogram and [place] will forward note to [facility] to w/u appt 11/15/17 for mammogram  4. Advise pt disc  with OB/GYN and call for f/u  5.  Manual check of HR 100  6. Lifestyle changes  Off Glipizide 5 mg qd, trulicity 7.10 weekly, [id number] mg qd, metformin 1000 m gbid Dr. Cruzita Lederer F/u with Endocrine   Check UA and urine protein today  Will need to do foot exam at f/u  Had eye exam no DM retinopathy f/u in 6 months per pt need to get records at f/u  7.  Had flu and pna 23  Tdap due to be updated ask at f/u  Check hep  b status at f/u  Lipid due 01/19/18. Check CMET, TSH  mammo sch 11/15/17 see above  Need to get copy of pap [facility] Former smoker quit 2012     Provider: Dr. Olivia Mackie McLean-[facility]ocuzza-Internal Medicine

## 2017-10-26 LAB — HEXAGONAL PHASE PHOSPHOLIPID: Hex Phosph Neut Test: 24 s — ABNORMAL HIGH (ref 0–11)

## 2017-10-26 LAB — CARDIOLIPIN ANTIBODIES, IGG, IGM, IGA
Anticardiolipin IgA: <9 APL U/mL (ref 0–11)
Anticardiolipin IgG: <9 GPL U/mL (ref 0–14)
Anticardiolipin IgM: <9 MPL U/mL (ref 0–12)

## 2017-10-26 LAB — PROTHROMBIN GENE MUTATION

## 2017-10-26 LAB — HEX PHASE PHOSPHOLIPID REFLEX 2

## 2017-10-26 LAB — FACTOR 5 LEIDEN

## 2017-11-02 DIAGNOSIS — L812 Freckles: Secondary | ICD-10-CM | POA: Diagnosis not present

## 2017-11-02 DIAGNOSIS — D2272 Melanocytic nevi of left lower limb, including hip: Secondary | ICD-10-CM | POA: Diagnosis not present

## 2017-11-02 DIAGNOSIS — D229 Melanocytic nevi, unspecified: Secondary | ICD-10-CM | POA: Diagnosis not present

## 2017-11-02 DIAGNOSIS — D225 Melanocytic nevi of trunk: Secondary | ICD-10-CM | POA: Diagnosis not present

## 2017-11-02 DIAGNOSIS — L570 Actinic keratosis: Secondary | ICD-10-CM | POA: Diagnosis not present

## 2017-11-13 ENCOUNTER — Telehealth: Payer: Self-pay | Admitting: Internal Medicine

## 2017-11-13 DIAGNOSIS — Z01419 Encounter for gynecological examination (general) (routine) without abnormal findings: Secondary | ICD-10-CM | POA: Diagnosis not present

## 2017-11-13 DIAGNOSIS — G890 Central pain syndrome: Secondary | ICD-10-CM | POA: Diagnosis not present

## 2017-11-13 DIAGNOSIS — R110 Nausea: Secondary | ICD-10-CM | POA: Diagnosis not present

## 2017-11-13 DIAGNOSIS — G894 Chronic pain syndrome: Secondary | ICD-10-CM | POA: Diagnosis not present

## 2017-11-13 DIAGNOSIS — Z79891 Long term (current) use of opiate analgesic: Secondary | ICD-10-CM | POA: Diagnosis not present

## 2017-11-13 DIAGNOSIS — Z1231 Encounter for screening mammogram for malignant neoplasm of breast: Secondary | ICD-10-CM | POA: Diagnosis not present

## 2017-11-13 LAB — HM MAMMOGRAPHY

## 2017-11-13 NOTE — Telephone Encounter (Signed)
Dulaglutide (TRULICITY) 1.22 QM/2.5OI SOPN   Patient stated that she can not afford this medication. The first time it was filled she only paid $45. It is now over $100 Patient says it works great she just can not afford this and would like to know if there is anything we are able to do    Please advise

## 2017-11-14 ENCOUNTER — Telehealth: Payer: Self-pay | Admitting: Internal Medicine

## 2017-11-14 NOTE — Telephone Encounter (Signed)
  Can she check with her insurance the price for alternatives to Trulicity: - Ozempic (once a week) - Bydureon (once a week) - Victoza (once a day) - Byetta (2x a day)  I would not like to add insulin yet, unless absolutely necessary.

## 2017-11-14 NOTE — Telephone Encounter (Signed)
Called patient. No answer. Left message to return call.

## 2017-11-14 NOTE — Telephone Encounter (Signed)
Spoke to patient. She said she has reached her doughnut hole for Medicare. Will just stay on Trulicity.

## 2017-11-14 NOTE — Telephone Encounter (Signed)
Spoke to patient. Gave instructions. Patient will contact insurance and c/b.

## 2017-11-14 NOTE — Telephone Encounter (Signed)
Patient want cassandra to know that the Trulicity is covered.

## 2017-11-14 NOTE — Telephone Encounter (Signed)
Spoke to patient. Patient states she just cannot afford Trulicity. Unable to use discount card b/c on Medicare. Would like to know next step.

## 2017-11-15 NOTE — Telephone Encounter (Signed)
Error

## 2017-11-20 ENCOUNTER — Other Ambulatory Visit: Payer: Self-pay | Admitting: Family Medicine

## 2017-11-20 NOTE — Telephone Encounter (Signed)
TM-Plz see refill requests/thx dmf

## 2017-11-21 ENCOUNTER — Other Ambulatory Visit: Payer: Self-pay | Admitting: Internal Medicine

## 2017-11-21 NOTE — Telephone Encounter (Signed)
refill 

## 2017-11-21 NOTE — Telephone Encounter (Signed)
Patient checking status, also requesting apixaban (ELIQUIS) 5 MG TABS tablet. Call back [phone]. Patient is out of meds

## 2017-11-22 ENCOUNTER — Telehealth: Payer: Self-pay | Admitting: Internal Medicine

## 2017-11-22 MED ORDER — APIXABAN 5 MG PO TABS: 5.0000 mg | ORAL_TABLET | Freq: Two times a day (BID) | ORAL | 1 refills | Status: DC

## 2017-11-22 NOTE — Telephone Encounter (Signed)
Copied from [place] [id number]. Topic: Quick Communication - Rx Refill/Question >> Nov 22, 2017 12:41 PM Wynetta Emery, [place] C wrote: Medication: Apixaban (ELIQUIS) 5 MG TABS tablet  Has the patient contacted their pharmacy? Yes, but pharmacy left this medication off of the request, pt says that she will be out tomorrow.   (Agent: If no, request that the patient contact the pharmacy for the refill.)  Preferred Pharmacy (with phone number or street name): [place], [place] - [place]: Please be advised that RX refills may take up to 3 business days. We ask that you follow-up with your pharmacy.

## 2017-12-06 ENCOUNTER — Encounter: Payer: Self-pay | Admitting: Internal Medicine

## 2017-12-11 ENCOUNTER — Other Ambulatory Visit: Payer: Self-pay | Admitting: Internal Medicine

## 2017-12-11 DIAGNOSIS — F329 Major depressive disorder, single episode, unspecified: Secondary | ICD-10-CM

## 2017-12-11 DIAGNOSIS — F419 Anxiety disorder, unspecified: Secondary | ICD-10-CM

## 2017-12-11 DIAGNOSIS — F32A Depression, unspecified: Secondary | ICD-10-CM

## 2017-12-11 MED ORDER — SERTRALINE HCL 100 MG PO TABS: 100.0000 mg | ORAL_TABLET | Freq: Every day | ORAL | 5 refills | Status: DC

## 2017-12-13 DIAGNOSIS — G894 Chronic pain syndrome: Secondary | ICD-10-CM | POA: Diagnosis not present

## 2017-12-13 DIAGNOSIS — R110 Nausea: Secondary | ICD-10-CM | POA: Diagnosis not present

## 2017-12-13 DIAGNOSIS — Z79891 Long term (current) use of opiate analgesic: Secondary | ICD-10-CM | POA: Diagnosis not present

## 2017-12-13 DIAGNOSIS — G890 Central pain syndrome: Secondary | ICD-10-CM | POA: Diagnosis not present

## 2017-12-14 DIAGNOSIS — D485 Neoplasm of uncertain behavior of skin: Secondary | ICD-10-CM | POA: Diagnosis not present

## 2017-12-14 DIAGNOSIS — L814 Other melanin hyperpigmentation: Secondary | ICD-10-CM | POA: Diagnosis not present

## 2017-12-14 DIAGNOSIS — L821 Other seborrheic keratosis: Secondary | ICD-10-CM | POA: Diagnosis not present

## 2017-12-18 ENCOUNTER — Ambulatory Visit: Payer: Medicare Other | Admitting: Internal Medicine

## 2017-12-18 ENCOUNTER — Encounter: Payer: Self-pay | Admitting: Internal Medicine

## 2017-12-18 VITALS — BP 106/56 | HR 85 | Temp 98.9°F | Ht 65.0 in | Wt 216.4 lb

## 2017-12-18 DIAGNOSIS — F329 Major depressive disorder, single episode, unspecified: Secondary | ICD-10-CM | POA: Diagnosis not present

## 2017-12-18 DIAGNOSIS — R112 Nausea with vomiting, unspecified: Secondary | ICD-10-CM

## 2017-12-18 DIAGNOSIS — F419 Anxiety disorder, unspecified: Secondary | ICD-10-CM | POA: Diagnosis not present

## 2017-12-18 DIAGNOSIS — E1165 Type 2 diabetes mellitus with hyperglycemia: Secondary | ICD-10-CM | POA: Diagnosis not present

## 2017-12-18 DIAGNOSIS — I2609 Other pulmonary embolism with acute cor pulmonale: Secondary | ICD-10-CM | POA: Diagnosis not present

## 2017-12-18 DIAGNOSIS — E118 Type 2 diabetes mellitus with unspecified complications: Secondary | ICD-10-CM

## 2017-12-18 DIAGNOSIS — F32A Depression, unspecified: Secondary | ICD-10-CM

## 2017-12-18 MED ORDER — ALPRAZOLAM 0.5 MG PO TABS: 0.5000 mg | ORAL_TABLET | Freq: Every day | ORAL | 2 refills | Status: DC | PRN

## 2017-12-18 NOTE — Progress Notes (Signed)
Chief Complaint  Patient presents with  . Follow-up   Follow up  1. Anxiety still not controlled she has been on xanax 0.5 qid and did not want to see psychiatry b/c they would not fill and also copay was $45. She is on increased dose of zoloft 100 mg qd just started 1-2 weeks ago to see if this helps   2. PE follows H/O will likely need to be on lifelong eliquis 5 mg bid  ods, no sinus pressure, no GERD sx's, no abdominal pain, cbg lowest has been 84. She feels better now and able to tolerate po. She reports GB is removed.    4. DM 2 supposed to be off glipizide 5 mg qd on jardiance 25 mg q, trulicity pt reports taking 1.5 though last note states 0.75 she has f/u with Dr. Cruzita Lederer in 12/2017, also on metformin 1000 mg bid.    Review of Systems  Constitutional: Positive for weight loss.       Down 1 lbs  HENT: Negative for hearing loss.   Eyes: Negative for blurred vision.  Respiratory: Negative for shortness of breath.   Cardiovascular: Negative for chest pain.  Gastrointestinal: Negative for abdominal pain, heartburn, nausea and vomiting.  Skin: Negative for rash.  Neurological: Negative for headaches.  Endo/Heme/Allergies:       Denies low cbg   Psychiatric/Behavioral: The patient is nervous/anxious.    Past Medical History:  Diagnosis Date  . Acute meniscal tear of left knee   . Allergic rhinitis   . Chronic constipation   . Demyelinating disorder ([place])    brain lesion that touches thalamic  . Diabetes mellitus, type II ([place])    controlled with medication;  . Dyslipidemia   . General weakness    left hand and leg  . Generalized anxiety disorder    mostly controlled  . History of adenomatous polyp of colon   . History of colitis   . Knee pain   . Migraine   . 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 Wears glasses    Past Surgical History:  Procedure Laterality Date  . BREAST REDUCTION SURGERY Bilateral 1997  . CHOLECYSTECTOMY  1998  . KNEE ARTHROSCOPY Right 1993  . KNEE ARTHROSCOPY WITH MEDIAL MENISECTOMY Left 10/09/2015   Procedure: LEFT KNEE ARTHROSCOPY WITH PARTIAL MEDIAL MENISCECTOMY; PATELLA-FEMORAL CHONDROPLASTY;  Surgeon: Rod Can, MD;  Location: [facility];  Service: Orthopedics;  Laterality: Left;  . TRANSTHORACIC ECHOCARDIOGRAM  03-02-2015   normal LVF,  ef 65-70%   Family History  Adopted: Yes   Social History   Socioeconomic History  . Marital status: Divorced    Spouse name: Not on file  . Number of children: 0  . Years of education: 12+  . Highest education level: Not on file  Occupational History  . Occupation: Therapist, sports- long-term disability   Social Needs  . Financial resource strain: Not on file  . Food insecurity:    Worry: Not on file    Inability: Not on file  . Transportation needs:    Medical: Not on file    Non-medical: Not on file  Tobacco Use  . Smoking status: Former Smoker    Packs/day: 0.25    Years: 20.00    Pack years: 5.00  Types: Cigarettes    Start date: 06/08/1986    Last attempt to quit: 08/22/2010    Years since quitting: 7.3  . Smokeless tobacco: Never Used  Substance and Sexual Activity  . Alcohol use: No    Alcohol/week: 0.0 oz  . Drug use: No  . Sexual activity: Yes    Comment: Mirena IUD placement Dec 2015; Removed on 10/04/17  Lifestyle  . Physical activity:    Days per week: Not on file    Minutes per session: Not on file  . Stress: Not on file  Relationships  . Social connections:    Talks on phone: Not on file    Gets together: Not on file    Attends religious service: Not on file    Active member of club or organization: Not on file    Attends meetings of clubs or organizations: Not on file    Relationship status: Not on file  . Intimate partner  violence:    Fear of current or ex partner: Not on file    Emotionally abused: Not on file    Physically abused: Not on file    Forced sexual activity: Not on file  Other Topics Concern  . Not on file  Social History Narrative   Divorced. Lives with parents    Caffeine use: 1 cup coffee 3 times per week   Right handed   Former Therapist, sports   Current Meds  Medication Sig  . ALPRAZolam (XANAX) 0.5 MG tablet Take 1 tablet (0.5 mg total) by mouth daily as needed for anxiety.  [name] apixaban (ELIQUIS) 5 MG TABS tablet Take 1 tablet (5 mg total) by mouth 2 (two) times daily.  [name] atorvastatin (LIPITOR) 10 MG tablet TAKE ONE TABLET BY MOUTH EVERY EVENING  . baclofen (LIORESAL) 10 MG tablet Take 1 tablet (10 mg total) by mouth 3 (three) times daily.  [name] buPROPion (WELLBUTRIN XL) 300 MG 24 hr tablet TAKE ONE TABLET BY MOUTH EVERY DAY NEED OFFICE VISIT BEFORE MORE REFILLS*  . Cetirizine HCl 10 MG CAPS Take 1 capsule (10 mg total) by mouth daily as needed. Take by mouth. (Patient taking differently: Take 10 mg by mouth every evening. Take by mouth.)  . docusate sodium (COLACE) 100 MG capsule Take 100 mg by mouth 2 (two) times daily.  [name] dronabinol (MARINOL) 2.5 MG capsule Take 2.5 mg by mouth daily as needed.  . Dulaglutide (TRULICITY) 3.41 PF/7.9KW SOPN Inject under skin weekly in am  . empagliflozin (JARDIANCE) 25 MG TABS tablet Take 25 mg by mouth daily.  [name] GRALISE 600 MG TABS Take 1,800 mg by mouth every evening.   . metFORMIN (GLUCOPHAGE) 1000 MG tablet TAKE 1 TABLET BY MOUTH TWICE DAILY WITH A MEAL  . metoCLOPramide (REGLAN) 5 MG tablet TAKE 1 TABLET 4 TIMES A DAY BEFORE MEALSAND AT BEDTIME  . morphine (MS CONTIN) 60 MG 12 hr tablet Take 60 mg by mouth every 12 (twelve) hours.  . multivitamin-iron-minerals-folic acid (CENTRUM) chewable tablet Chew 1 tablet by mouth daily.  [name] oxyCODONE (OXY IR/ROXICODONE) 5 MG immediate release tablet Take 1 tablet (5 mg total) by mouth every 4 (four) hours as needed for  severe pain. Per pain clinic Dr Hardin Negus  . sertraline (ZOLOFT) 100 MG tablet Take 1 tablet (100 mg total) by mouth daily.  . traZODone (DESYREL) 100 MG tablet TAKE ONE TABLET BY MOUTH AT BEDTIME AS NEEDED FOR SLEEP  . valACYclovir (VALTREX) 1000 MG tablet Take 1 tablet (1,000 mg total)  by mouth daily as needed. Must estab w/new provider for future refills  . [DISCONTINUED] ALPRAZolam (XANAX) 0.5 MG tablet TAKE ONE TABLET BY MOUTH FOUR TIMES DAILY AS NEEDED FOR ANXIETY   Allergies  Allergen Reactions  . Latex Hives   Recent Results (from the past 2160 hour(s))  Basic metabolic panel     Status: Abnormal   Collection Time: 10/02/17  4:57 PM  Result Value Ref Range   Sodium 137 135 - 145 mmol/L   Potassium 4.2 3.5 - 5.1 mmol/L   Chloride 103 101 - 111 mmol/L   CO2 24 22 - 32 mmol/L   Glucose, Bld 128 (H) 65 - 99 mg/dL   BUN 15 6 - 20 mg/dL   Creatinine, Ser 0.91 0.44 - 1.00 mg/dL   Calcium 9.4 8.9 - 10.3 mg/dL   GFR calc non Af Amer >60 >60 mL/min   GFR calc Af Amer >60 >60 mL/min    Comment: (NOTE) The eGFR has been calculated using the CKD EPI equation. This calculation has not been validated in all clinical situations. eGFR's persistently <60 mL/min signify possible Chronic Kidney Disease.    Anion gap 10 5 - 15    Comment: Performed at [facility], [address] [place], [place] [address]  CBC     Status: Abnormal   Collection Time: 10/02/17  4:57 PM  Result Value Ref Range   WBC 10.4 3.6 - 11.0 K/uL   RBC 5.26 (H) 3.80 - 5.20 MIL/uL   Hemoglobin 14.1 12.0 - 16.0 g/dL   HCT 42.8 35.0 - 47.0 %   MCV 81.3 80.0 - 100.0 fL   MCH 26.8 26.0 - 34.0 pg   MCHC 33.0 32.0 - 36.0 g/dL   RDW 15.2 (H) 11.5 - 14.5 %   Platelets 290 150 - 440 K/uL    Comment: Performed at [facility], [address] [place], [place] [address]  Troponin I     Status: Abnormal   Collection Time: 10/02/17  4:57 PM  Result Value Ref Range   Troponin I 0.17 (HH) <0.03 ng/mL     Comment: CRITICAL RESULT CALLED TO, READ BACK BY AND VERIFIED WITH MONICA MOON ON 10/02/17 AT 1807 [facility] Performed at [facility], [address] [place], [place] [address]   Glucose, capillary     Status: Abnormal   Collection Time: 10/02/17 10:11 PM  Result Value Ref Range   Glucose-Capillary 150 (H) 65 - 99 mg/dL  HIV antibody (Routine Testing)     Status: None   Collection Time: 10/02/17 10:16 PM  Result Value Ref Range   HIV Screen 4th Generation wRfx Non Reactive Non Reactive    Comment: (NOTE) Performed At: [facility] [place], [place] [id number] Rush Farmer MD [phone] Performed at [facility], [address] [place], [place] [address]   Basic metabolic panel     Status: Abnormal   Collection Time: 10/03/17  5:58 AM  Result Value Ref Range   Sodium 137 135 - 145 mmol/L   Potassium 3.9 3.5 - 5.1 mmol/L   Chloride 104 101 - 111 mmol/L   CO2 24 22 - 32 mmol/L   Glucose, Bld 124 (H) 65 - 99 mg/dL   BUN 12 6 - 20 mg/dL   Creatinine, Ser 0.88 0.44 - 1.00 mg/dL   Calcium 8.8 (L) 8.9 - 10.3 mg/dL   GFR calc non Af Amer >60 >60 mL/min   GFR calc Af Amer >60 >  60 mL/min    Comment: (NOTE) The eGFR has been calculated using the CKD EPI equation. This calculation has not been validated in all clinical situations. eGFR's persistently <60 mL/min signify possible Chronic Kidney Disease.    Anion gap 9 5 - 15    Comment: Performed at [facility], [address] [place], [place] [address]  CBC     Status: Abnormal   Collection Time: 10/03/17  5:58 AM  Result Value Ref Range   WBC 7.7 3.6 - 11.0 K/uL   RBC 4.62 3.80 - 5.20 MIL/uL   Hemoglobin 12.3 12.0 - 16.0 g/dL   HCT 38.2 35.0 - 47.0 %   MCV 82.5 80.0 - 100.0 fL   MCH 26.7 26.0 - 34.0 pg   MCHC 32.3 32.0 - 36.0 g/dL   RDW 15.2 (H) 11.5 - 14.5 %   Platelets 235 150 - 440 K/uL    Comment: Performed at [facility], [address] [place], [place]  [address]  Glucose, capillary     Status: Abnormal   Collection Time: 10/03/17  9:10 AM  Result Value Ref Range   Glucose-Capillary 156 (H) 65 - 99 mg/dL  ECHOCARDIOGRAM COMPLETE     Status: None   Collection Time: 10/03/17 10:11 AM  Result Value Ref Range   Weight 3,614.4 oz   Height 64 in   BP 120/83 mmHg  Glucose, capillary     Status: Abnormal   Collection Time: 10/03/17 11:49 AM  Result Value Ref Range   Glucose-Capillary 229 (H) 65 - 99 mg/dL   Comment 1 Notify RN   Factor 5 leiden     Status: None   Collection Time: 10/10/17 10:40 AM  Result Value Ref Range   Recommendations-F5LEID: Comment     Comment: (NOTE) Result:  Negative (no mutation found) Factor V Leiden is a specific mutation (R506Q) in the factor V gene that is associated with an increased risk of venous thrombosis. Factor V Leiden is more resistant to inactivation by activated protein C.  As a result, factor V persists in the circulation leading to a mild hyper- coagulable state.  The Leiden mutation accounts for 90% - 95% of APC resistance.  Factor V Leiden has been reported in patients with deep vein thrombosis, pulmonary embolus, central retinal vein occlusion, cerebral sinus thrombosis and hepatic vein thrombosis. Other risk factors to be considered in the workup for venous thrombosis include the G20210A mutation in the factor II (prothrombin) gene, protein S and C deficiency, and antithrombin deficiencies. Anticardiolipin antibody and lupus anticoagulant analysis may be appropriate for certain patients, as well as homocysteine levels. Contact your local [facility] for information on how to order additi onal testing if desired. Genetic counselors are available for health care providers to  discuss results at 1-800-345-GENE [phone]). Methodology: DNA analysis of the Factor V gene was performed by allele-specific PCR. The diagnostic sensitivity and specificity is >99% for both. Molecular-based testing is  highly accurate, but as in any laboratory test, diagnostic errors may occur. All test results must be combined with clinical information for the most accurate interpretation. This test was developed and its performance characteristics determined by [facility]. It has not been cleared or approved by the Food and Drug Administration. References: Voelkerding K (1996).  Clin Lab Med [phone]. Allison Quarry, PhD, [facility] Ruben Reason, PhD, [facility] Annetta Maw, M.S., PhD, [facility] Alfredo Bach, PhD, [facility] Norva Riffle, PhD, [facility] Earlean Polka PhD, [facility] Performed At: [facility] RTP [phone]  [address] [place], [place] [id number] Nechama Guard MD [id number] Performed at [facility], [address] [place], [place] [address]   Prothrombin gene mutation     Status: None   Collection Time: 10/10/17 10:40 AM  Result Value Ref Range   Recommendations-PTGENE: Comment     Comment: (NOTE) NEGATIVE No mutation identified. Comment: A point mutation (G20210A) in the factor II (prothrombin) gene is the second most common cause of inherited thrombophilia. The incidence of this mutation in the [place] Caucasian population is about 2% and in the [place] American population it is approximately 0.5%. This mutation is rare in the [place] and Native American population. Being heterozygous for a prothrombin mutation increases the risk for developing venous thrombosis about 2 to 3 times above the general population risk. Being homozygous for the prothrombin gene mutation increases the relative risk for venous thrombosis further, although it is not yet known how much further the risk is increased. In women heterozygous for the prothrombin gene mutation, the use of estrogen containing oral contraceptives increases the relative risk of venous thrombosis about 16 times and the risk of developing cerebral thrombosis is also significantly increased. In pregnancy the pr othrombin gene  mutation increases risk for venous thrombosis and may increase risk for stillbirth, placental abruption, pre-eclampsia and fetal growth restriction. If the patient possesses two or more congenital or acquired thrombophilic risk factors, the risk for thrombosis may rise to more than the sum of the risk ratios for the individual mutations. This assay detects only the prothrombin G20210A mutation and does not measure genetic abnormalities elsewhere in the genome. Other thrombotic risk factors may be pursued through systematic clinical laboratory analysis. These factors include the R506Q (Leiden) mutation in the Factor V gene, plasma homocysteine levels, as well as testing for deficiencies of antithrombin III, protein C and protein S. Genetic Counselors are available for health care providers to discuss results at 1-800-345-GENE [phone]). Methodology: DNA analysis of the Factor II gene was performed by PCR amplification followed by restriction analysis. The di agnostic sensitivity is >99% for both. All the tests must be combined with clinical information for the most accurate interpretation. Molecular-based testing is highly accurate, but as in any laboratory test, diagnostic errors may occur. This test was developed and its performance characteristics determined by [facility]. It has not been cleared or approved by the Food and Drug Administration. Poort SR, et al. Blood. 1996; 35:4562-5638. Varga EA. Circulation. 2004; 937:D42-A76. Mervin Hack, et [place]; 19:700-703. Allison Quarry, PhD, [facility] Ruben Reason, PhD, [facility] Annetta Maw, M.S., PhD, [facility] Alfredo Bach, PhD, [facility] Norva Riffle, PhD, [facility] Earlean Polka, PhD, [facility] Performed At: [facility] [place], [place] [id number] Nechama Guard MD [id number] Performed at [facility], [place], [place] [address]   Cardiolipin antibodies, IgG,  IgM, IgA     Status: None   Collection Time: 10/10/17 10:40 AM  Result Value Ref Range   Anticardiolipin IgG <9 0 - 14 GPL U/mL    Comment: (NOTE)                          Negative:              <15                          Indeterminate:     15 -  20                          Low-Med Positive: >20 - 80                          High Positive:         >80    Anticardiolipin IgM <9 0 - 12 MPL U/mL    Comment: (NOTE)                          Negative:              <13                          Indeterminate:     13 - 20                          Low-Med Positive: >20 - 80                          High Positive:         >80    Anticardiolipin IgA <9 0 - 11 APL U/mL    Comment: (NOTE)                          Negative:              <12                          Indeterminate:     12 - 20                          Low-Med Positive: >20 - 80                          High Positive:         >80 Performed At: [facility] [address] [place], [place] [id number] Rush Farmer MD [phone] Performed at [facility], [address] [place], [place] [address]   Hexagonal Phase Phospholipid     Status: Abnormal   Collection Time: 10/10/17 10:40 AM  Result Value Ref Range   Hex Phosph Neut Test 24 (H) 0 - 11 sec    Comment: (NOTE) Verified by repeat analysis Performed At: [facility] [place], [place] [id number] Rush Farmer MD [id number] Performed at [facility], [place], [place] [address]   Hex Phase Phospholipid Reflex 2     Status: None   Collection Time: 10/10/17 10:40 AM  Result Value Ref Range   Hex Phase Phospholipid Comment Comment     Comment: (NOTE) Results are consistent with the presence of a lupus anticoagulant. NOTE: Only persistent lupus anticoagulants are thought to be of clinical significance. For this reason, repeat testing in 12 or more weeks after an initial positive result should be considered  to confirm or refute the presence of a lupus anticoagulant, depending on clinical presentation. Results of lupus anticoagulant tests may be falsely positive in the presence of certain anticoagulant therapies. Performed At: [facility] [place], [place] [id number] Rush Farmer MD [id number] Performed  at [facility], [address] [place], [place] [address]   POCT glycosylated hemoglobin (Hb A1C)     Status: Abnormal   Collection Time: 10/13/17 11:50 AM  Result Value Ref Range   Hemoglobin A1C 8.3    Objective  Body mass index is 36.01 kg/m. Wt Readings from Last 3 Encounters:  12/18/17 216 lb 6.4 oz (98.2 kg)  10/19/17 217 lb 6.4 oz (98.6 kg)  10/13/17 219 lb (99.3 kg)   Temp Readings from Last 3 Encounters:  12/18/17 98.9 F (37.2 C) (Oral)  10/19/17 98.6 F (37 C) (Oral)  10/10/17 98.2 F (36.8 C) (Tympanic)   BP Readings from Last 3 Encounters:  12/18/17 (!) 106/56  10/19/17 102/62  10/13/17 128/80   Pulse Readings from Last 3 Encounters:  12/18/17 85  10/19/17 (!) 106  10/13/17 100    Physical Exam  Constitutional: She is oriented to person, place, and time. Vital signs are normal. She appears well-developed and well-nourished. She is cooperative.  HENT:  Head: Normocephalic and atraumatic.  Mouth/Throat: Oropharynx is clear and moist and mucous membranes are normal.  Eyes: Pupils are equal, round, and reactive to light. Conjunctivae are normal.  Cardiovascular: Normal rate, regular rhythm and normal heart sounds.  Pulmonary/Chest: Effort normal and breath sounds normal.  Abdominal: Soft. Bowel sounds are normal. There is no tenderness.  Neurological: She is alert and oriented to person, place, and time. Gait normal.  Skin: Skin is warm, dry and intact.  Psychiatric: She has a normal mood and affect. Her speech is normal and behavior is normal. Judgment and thought content normal. Cognition and memory are normal.  Flat  affect normal mood  Nursing note and vitals reviewed.   Assessment   1. Anxiety and depression  2. PE  3. DM 2  4. Acute n/v no etiology resolved as of today and occurred today. Could be medication related pt does not seen acutely ill  5. HM Plan  1. Cont zoloft 100 mg qd, wellbutrin 300 XL  My chart in 3-5 weeks if doing ok  Will only do low dose xanax 0.5 qd 1/2 tablet to 1 tablet qd prn  Pt does not want to see psych and psych would not fill xanax chronically  Advised pt to only use xanax prn  2.  rec avoid marinol given by pain clinic as can cause DVT PE F/u H/o  On eliquis 5 mg bid per pt will be life long  3.  F/u endocrine Dr. Cruzita Lederer 12/2017  On metformin 3151 bid, trulicity per pt she states she is on 1.5 though prior endo notes report 0.75, on jardiance 25. Off glipizide 5 mg 2/2 hypoglycemia  UA and protein today  Do foot exam in future  Pt due to f/u eye MD q6 months  4.  Bland diet, hydration  Let me know if not better  Already resolved before visit today  Consider gastric emptying study in future if n/v continues   5.  Had flu and pna 23  Tdap due to be updated asked today pt wanted to wait due to #4  Check hep  b status in future  Lipid due 01/19/18. Check CMET, TSH, hep B status in future. Vit D normal 03/10/16 46.22   mammo and pap had 10/2017 [facility] -need to get records signed release today   Former smoker quit 2012     Provider: Dr. Olivia Mackie McLean-Scocuzza-Internal Medicine

## 2017-12-18 NOTE — Patient Instructions (Signed)
Please my chart me in 3-5 weeks to let me know how your anxiety is doing  Take care  If nausea does not get better let me know  Try bland diet for now  Please stop marinol   Bland Diet A bland diet consists of foods that do not have a lot of fat or fiber. Foods without fat or fiber are easier for the body to digest. They are also less likely to irritate your mouth, throat, stomach, and other parts of your gastrointestinal tract. A bland diet is sometimes called a BRAT diet. What is my plan? Your health care provider or dietitian may recommend specific changes to your diet to prevent and treat your symptoms, such as:  Eating small meals often.  Cooking food until it is soft enough to chew easily.  Chewing your food well.  Drinking fluids slowly.  Not eating foods that are very spicy, sour, or fatty.  Not eating citrus fruits, such as oranges and grapefruit.  What do I need to know about this diet?  Eat a variety of foods from the bland diet food list.  Do not follow a bland diet longer than you have to.  Ask your health care provider whether you should take vitamins. What foods can I eat? Grains  Hot cereals, such as cream of wheat. Bread, crackers, or tortillas made from refined white flour. Rice. Vegetables Canned or cooked vegetables. Mashed or boiled potatoes. Fruits Bananas. Applesauce. Other types of cooked or canned fruit with the skin and seeds removed, such as canned peaches or pears. Meats and Other Protein Sources Scrambled eggs. Creamy peanut butter or other nut butters. Lean, well-cooked meats, such as chicken or fish. Tofu. Soups or broths. Dairy Low-fat dairy products, such as milk, cottage cheese, or yogurt. Beverages Water. Herbal tea. Apple juice. Sweets and Desserts Pudding. Custard. Fruit gelatin. Ice cream. Fats and Oils Mild salad dressings. Canola or olive oil. The items listed above may not be a complete list of allowed foods or beverages.  Contact your dietitian for more options. What foods are not recommended? Foods and ingredients that are often not recommended include:  Spicy foods, such as hot sauce or salsa.  Fried foods.  Sour foods, such as pickled or fermented foods.  Raw vegetables or fruits, especially citrus or berries.  Caffeinated drinks.  Alcohol.  Strongly flavored seasonings or condiments.  The items listed above may not be a complete list of foods and beverages that are not allowed. Contact your dietitian for more information. This information is not intended to replace advice given to you by your health care provider. Make sure you discuss any questions you have with your health care provider. Document Released: 11/30/2015 Document Revised: 01/14/2016 Document Reviewed: 08/20/2014 Elsevier Interactive Patient Education  2018 [employer].    Nausea, Adult Nausea is the feeling of an upset stomach or having to vomit. Nausea on its own is not usually a serious concern, but it may be an early sign of a more serious medical problem. As nausea gets worse, it can lead to vomiting. If vomiting develops, or if you are not able to drink enough fluids, you are at risk of becoming dehydrated. Dehydration can make you tired and thirsty, cause you to have a dry mouth, and decrease how often you urinate. Older adults and people with other diseases or a weak immune system are at higher risk for dehydration. The main goals of treating your nausea are:  To limit repeated nausea episodes.  To prevent vomiting and dehydration.  Follow these instructions at home: Follow instructions from your health care provider about how to care for yourself at home. Eating and drinking Follow these recommendations as told by your health care provider:  Take an oral rehydration solution (ORS). This is a drink that is sold at pharmacies and retail stores.  Drink clear fluids in small amounts as you are able. Clear fluids include  water, ice chips, diluted fruit juice, and low-calorie sports drinks.  Eat bland, easy-to-digest foods in small amounts as you are able. These foods include bananas, applesauce, rice, lean meats, toast, and crackers.  Avoid drinking fluids that contain a lot of sugar or caffeine, such as energy drinks, sports drinks, and soda.  Avoid alcohol.  Avoid spicy or fatty foods.  General instructions  Drink enough fluid to keep your urine clear or pale yellow.  Wash your hands often. If soap and water are not available, use hand sanitizer.  Make sure that all people in your household wash their hands well and often.  Rest at home while you recover.  Take over-the-counter and prescription medicines only as told by your health care provider.  Breathe slowly and deeply when you feel nauseous.  Watch your condition for any changes.  Keep all follow-up visits as told by your health care provider. This is important. Contact a health care provider if:  You have a headache.  You have new symptoms.  Your nausea gets worse.  You have a fever.  You feel light-headed or dizzy.  You vomit.  You cannot keep fluids down. Get help right away if:  You have pain in your chest, neck, arm, or jaw.  You feel extremely weak or you faint.  You have vomit that is bright red or looks like coffee grounds.  You have bloody or black stools or stools that look like tar.  You have a severe headache, a stiff neck, or both.  You have severe pain, cramping, or bloating in your abdomen.  You have a rash.  You have difficulty breathing or are breathing very quickly.  Your heart is beating very quickly.  Your skin feels cold and clammy.  You feel confused.  You have pain when you urinate.  You have signs of dehydration, such as: ? Dark urine, very little, or no urine. ? Cracked lips. ? Dry mouth. ? Sunken eyes. ? Sleepiness. ? Weakness. These symptoms may represent a serious problem  that is an emergency. Do not wait to see if the symptoms will go away. Get medical help right away. Call your local emergency services (911 in the [place] Do not drive yourself to the hospital. This information is not intended to replace advice given to you by your health care provider. Make sure you discuss any questions you have with your health care provider. Document Released: 09/15/2004 Document Revised: 01/11/2016 Document Reviewed: 04/14/2015 Elsevier Interactive Patient Education  [employer].

## 2017-12-19 LAB — URINALYSIS, ROUTINE W REFLEX MICROSCOPIC
Bilirubin, UA: NEGATIVE
Ketones, UA: NEGATIVE
Leukocytes, UA: NEGATIVE
Nitrite, UA: NEGATIVE
Protein, UA: NEGATIVE
RBC, UA: NEGATIVE
Specific Gravity, UA: >=1.03 — AB (ref 1.005–1.030)
Urobilinogen, Ur: 0.2 mg/dL (ref 0.2–1.0)
pH, UA: 6.5 (ref 5.0–7.5)

## 2017-12-19 LAB — MICROALBUMIN / CREATININE URINE RATIO
Creatinine, Urine: 53.6 mg/dL
Microalb/Creat Ratio: <5.6 mg/g creat (ref 0.0–30.0)
Microalbumin, Urine: <3 ug/mL

## 2017-12-21 DIAGNOSIS — L245 Irritant contact dermatitis due to other chemical products: Secondary | ICD-10-CM | POA: Diagnosis not present

## 2017-12-21 DIAGNOSIS — L814 Other melanin hyperpigmentation: Secondary | ICD-10-CM | POA: Diagnosis not present

## 2017-12-21 DIAGNOSIS — L821 Other seborrheic keratosis: Secondary | ICD-10-CM | POA: Diagnosis not present

## 2017-12-27 ENCOUNTER — Encounter: Payer: Self-pay | Admitting: Internal Medicine

## 2017-12-27 NOTE — Progress Notes (Signed)
Mammogram neg 11/13/17 [facility] Pap neg HPV neg 10/04/17   [place]

## 2018-01-05 ENCOUNTER — Inpatient Hospital Stay: Payer: Medicare Other | Attending: Oncology

## 2018-01-05 ENCOUNTER — Other Ambulatory Visit: Payer: Self-pay | Admitting: *Deleted

## 2018-01-05 DIAGNOSIS — I2699 Other pulmonary embolism without acute cor pulmonale: Secondary | ICD-10-CM

## 2018-01-09 ENCOUNTER — Other Ambulatory Visit: Payer: Self-pay

## 2018-01-09 ENCOUNTER — Encounter: Payer: Self-pay | Admitting: Emergency Medicine

## 2018-01-09 ENCOUNTER — Encounter: Payer: Self-pay | Admitting: Internal Medicine

## 2018-01-09 ENCOUNTER — Emergency Department: Payer: Medicare Other

## 2018-01-09 ENCOUNTER — Ambulatory Visit: Payer: Medicare Other | Admitting: Internal Medicine

## 2018-01-09 ENCOUNTER — Emergency Department
Admission: EM | Admit: 2018-01-09 | Discharge: 2018-01-09 | Disposition: A | Discharge status: Home / Self Care | Payer: Medicare Other | Attending: Student in an Organized Health Care Education/Training Program | Admitting: Student in an Organized Health Care Education/Training Program

## 2018-01-09 VITALS — BP 108/68 | HR 101 | Ht 65.0 in | Wt 218.2 lb

## 2018-01-09 DIAGNOSIS — S40012A Contusion of left shoulder, initial encounter: Secondary | ICD-10-CM | POA: Diagnosis not present

## 2018-01-09 DIAGNOSIS — E1165 Type 2 diabetes mellitus with hyperglycemia: Secondary | ICD-10-CM

## 2018-01-09 DIAGNOSIS — S161XXA Strain of muscle, fascia and tendon at neck level, initial encounter: Secondary | ICD-10-CM | POA: Diagnosis not present

## 2018-01-09 DIAGNOSIS — Y92410 Unspecified street and highway as the place of occurrence of the external cause: Secondary | ICD-10-CM | POA: Diagnosis not present

## 2018-01-09 DIAGNOSIS — S4992XA Unspecified injury of left shoulder and upper arm, initial encounter: Secondary | ICD-10-CM | POA: Diagnosis not present

## 2018-01-09 DIAGNOSIS — S3992XA Unspecified injury of lower back, initial encounter: Secondary | ICD-10-CM | POA: Diagnosis not present

## 2018-01-09 DIAGNOSIS — Z91040 Latex allergy status: Secondary | ICD-10-CM | POA: Diagnosis not present

## 2018-01-09 DIAGNOSIS — G890 Central pain syndrome: Secondary | ICD-10-CM | POA: Diagnosis not present

## 2018-01-09 DIAGNOSIS — S79912A Unspecified injury of left hip, initial encounter: Secondary | ICD-10-CM | POA: Diagnosis not present

## 2018-01-09 DIAGNOSIS — S0990XA Unspecified injury of head, initial encounter: Secondary | ICD-10-CM | POA: Diagnosis present

## 2018-01-09 DIAGNOSIS — Y999 Unspecified external cause status: Secondary | ICD-10-CM | POA: Insufficient documentation

## 2018-01-09 DIAGNOSIS — Z6836 Body mass index (BMI) 36.0-36.9, adult: Secondary | ICD-10-CM | POA: Diagnosis not present

## 2018-01-09 DIAGNOSIS — S7002XA Contusion of left hip, initial encounter: Secondary | ICD-10-CM | POA: Insufficient documentation

## 2018-01-09 DIAGNOSIS — Y939 Activity, unspecified: Secondary | ICD-10-CM | POA: Insufficient documentation

## 2018-01-09 DIAGNOSIS — Z87891 Personal history of nicotine dependence: Secondary | ICD-10-CM | POA: Diagnosis not present

## 2018-01-09 DIAGNOSIS — M545 Low back pain: Secondary | ICD-10-CM | POA: Diagnosis not present

## 2018-01-09 DIAGNOSIS — E119 Type 2 diabetes mellitus without complications: Secondary | ICD-10-CM | POA: Insufficient documentation

## 2018-01-09 DIAGNOSIS — R110 Nausea: Secondary | ICD-10-CM | POA: Diagnosis not present

## 2018-01-09 DIAGNOSIS — Z79899 Other long term (current) drug therapy: Secondary | ICD-10-CM | POA: Diagnosis not present

## 2018-01-09 DIAGNOSIS — S199XXA Unspecified injury of neck, initial encounter: Secondary | ICD-10-CM | POA: Diagnosis not present

## 2018-01-09 DIAGNOSIS — Z79891 Long term (current) use of opiate analgesic: Secondary | ICD-10-CM | POA: Diagnosis not present

## 2018-01-09 DIAGNOSIS — Z7901 Long term (current) use of anticoagulants: Secondary | ICD-10-CM | POA: Insufficient documentation

## 2018-01-09 DIAGNOSIS — M542 Cervicalgia: Secondary | ICD-10-CM | POA: Diagnosis not present

## 2018-01-09 DIAGNOSIS — R51 Headache: Secondary | ICD-10-CM | POA: Diagnosis not present

## 2018-01-09 DIAGNOSIS — Z7984 Long term (current) use of oral hypoglycemic drugs: Secondary | ICD-10-CM | POA: Diagnosis not present

## 2018-01-09 DIAGNOSIS — E785 Hyperlipidemia, unspecified: Secondary | ICD-10-CM | POA: Diagnosis not present

## 2018-01-09 DIAGNOSIS — G894 Chronic pain syndrome: Secondary | ICD-10-CM | POA: Diagnosis not present

## 2018-01-09 DIAGNOSIS — N926 Irregular menstruation, unspecified: Secondary | ICD-10-CM | POA: Diagnosis not present

## 2018-01-09 LAB — POCT GLYCOSYLATED HEMOGLOBIN (HGB A1C): Hemoglobin A1C: 6.3 % — AB (ref 4.0–5.6)

## 2018-01-09 MED ORDER — OXYCODONE-ACETAMINOPHEN 5-325 MG PO TABS
1.0000 | ORAL_TABLET | Freq: Once | ORAL | Status: AC
  Administered 2018-01-09: 1 via ORAL
  Filled 2018-01-09: qty 1

## 2018-01-09 MED ORDER — PROMETHAZINE HCL 25 MG/ML IJ SOLN
25.0000 mg | Freq: Once | INTRAMUSCULAR | Status: AC
  Administered 2018-01-09: 25 mg via INTRAMUSCULAR
  Filled 2018-01-09: qty 1

## 2018-01-09 MED ORDER — ORPHENADRINE CITRATE 30 MG/ML IJ SOLN
60.0000 mg | Freq: Once | INTRAMUSCULAR | Status: AC
  Administered 2018-01-09: 60 mg via INTRAMUSCULAR
  Filled 2018-01-09: qty 2

## 2018-01-09 MED ORDER — MELOXICAM 15 MG PO TABS: 15.0000 mg | ORAL_TABLET | Freq: Every day | ORAL | 0 refills | Status: DC

## 2018-01-09 MED ORDER — KETOROLAC TROMETHAMINE 30 MG/ML IJ SOLN
30.0000 mg | Freq: Once | INTRAMUSCULAR | Status: AC
  Administered 2018-01-09: 30 mg via INTRAMUSCULAR
  Filled 2018-01-09: qty 1

## 2018-01-09 MED ORDER — METHOCARBAMOL 500 MG PO TABS: 500.0000 mg | ORAL_TABLET | Freq: Four times a day (QID) | ORAL | 0 refills | Status: DC

## 2018-01-09 NOTE — Progress Notes (Signed)
Patient ID: Amanda Vasquez, female   DOB: 26-Mar-1971, 47 y.o.   MRN: [id number]   HPI: Amanda Vasquez is a 47 y.o.-year-old female, returning for follow-up for DM2, dx in ~1997, non-insulin-dependent, uncontrolled, with complications (mild nonproliferative DR).  Last visit 3 months ago.  She is limiting her carbs.  She also started to exercise: power waking   Last hemoglobin A1c: Lab Results  Component Value Date   HGBA1C 8.3 10/13/2017   HGBA1C 8.9 05/30/2017   HGBA1C 9.7 (H) 01/19/2017  She has a history of migraines >> steroids tapers.   Pt is on a regimen of: - Metformin 1000 mg 2x a day, with meals - Glipizide 10 >> 5 mg in am, before b'fast - Jardiance 25 mg daily in a.m. -added 05/2017.  She had no recent infection since last visit. - Trulicity 1.5 mg weekly - started 09/2017 We stopped Bydureon 2 mg weekly as she had Nausea >> after stopping, nausea did not improve.  Pt checks her sugars 1-2X a day: - am: 150-200s >> 90-120 >> 111-130, 222 >> 80-120 - 2h after b'fast: n/c - before lunch: n/c - 2h after lunch: n/c >> 130-140 >> 104-229 >> 80-85 - before dinner: n/c >> 55 (delayed dinner)-207 >> 90s - 2h after dinner: n/c - bedtime: n/c - nighttime: n/c Lowest sugar was 55 >> 55; she has hypoglycemia awareness in the 90s. Highest sugar was 200s >> 141.  Glucometer: AccuChek  Pt's meals are: - Breakfast: hot tea, toast  - Lunch: ham and PB sandwich - Dinner: soup , salad, sometimes chicken - Snacks: muffin; no sodas  -No CKD, last BUN/creatinine:  Lab Results  Component Value Date   BUN 12 10/03/2017   BUN 15 10/02/2017   CREATININE 0.88 10/03/2017   CREATININE 0.91 10/02/2017   -+ HL; last set of lipids: Lab Results  Component Value Date   CHOL 166 01/19/2017   HDL 56.60 01/19/2017   LDLCALC 71 01/19/2017   LDLDIRECT 138.8 06/22/2011   TRIG 191.0 (H) 01/19/2017   CHOLHDL 3 01/19/2017  On Lipitor 10. - last eye exam was on 05/2017: + DR OS (mild  nonproliferative DR), improved at last check - no numbness and tingling in her feet.  Unclear  FH of DM  - pt adopted.  She stopped Frovo-migraine medicine, per neurology.  No recent migraines.  She was admitted 10/02/2017 for PE. On Eliquis.   ROS: Constitutional: no weight gain/no weight loss, no fatigue, no subjective hyperthermia, no subjective hypothermia Eyes: no blurry vision, no xerophthalmia ENT: no sore throat, no nodules palpated in throat, no dysphagia, no odynophagia, no hoarseness Cardiovascular: no CP/no SOB/no palpitations/no leg swelling Respiratory: no cough/no SOB/no wheezing Gastrointestinal: no N/no V/no D/no C/no acid reflux Musculoskeletal: no muscle aches/no joint aches Skin: no rashes, no hair loss Neurological: no tremors/no numbness/no tingling/no dizziness  I reviewed pt's medications, allergies, PMH, social hx, family hx, and changes were documented in the history of present illness. Otherwise, unchanged from my initial visit note.  Past Medical History:  Diagnosis Date  . Acute meniscal tear of left knee   . Allergic rhinitis   . Chronic constipation   . Demyelinating disorder ([place])    brain lesion that touches thalamic  . Diabetes mellitus, type II ([place])    controlled with medication;  . Dyslipidemia   . General weakness    left hand and leg  . Generalized anxiety disorder    mostly controlled  . History of  adenomatous polyp of colon   . History of colitis   . Knee pain   . Migraine   . 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 Wears glasses    Past Surgical History:  Procedure Laterality Date  . BREAST REDUCTION SURGERY Bilateral 1997  . CHOLECYSTECTOMY  1998  . KNEE ARTHROSCOPY Right 1993  . KNEE ARTHROSCOPY WITH MEDIAL  MENISECTOMY Left 10/09/2015   Procedure: LEFT KNEE ARTHROSCOPY WITH PARTIAL MEDIAL MENISCECTOMY; PATELLA-FEMORAL CHONDROPLASTY;  Surgeon: Rod Can, MD;  Location: [facility];  Service: Orthopedics;  Laterality: Left;  . TRANSTHORACIC ECHOCARDIOGRAM  03-02-2015   normal LVF,  ef 65-70%   Social History   Socioeconomic History  . Marital status: Divorced    Spouse name: Not on file  . Number of children: 0  . Years of education: 12+  . Highest education level: Not on file  Occupational History  . Occupation: Therapist, sports- long-term disability   Social Needs  . Financial resource strain: Not on file  . Food insecurity:    Worry: Not on file    Inability: Not on file  . Transportation needs:    Medical: Not on file    Non-medical: Not on file  Tobacco Use  . Smoking status: Former Smoker    Packs/day: 0.25    Years: 20.00    Pack years: 5.00    Types: Cigarettes    Start date: 06/08/1986    Last attempt to quit: 08/22/2010    Years since quitting: 7.3  . Smokeless tobacco: Never Used  Substance and Sexual Activity  . Alcohol use: No    Alcohol/week: 0.0 oz  . Drug use: No  . Sexual activity: Yes    Comment: Mirena IUD placement Dec 2015; Removed on 10/04/17  Lifestyle  . Physical activity:    Days per week: Not on file    Minutes per session: Not on file  . Stress: Not on file  Relationships  . Social connections:    Talks on phone: Not on file    Gets together: Not on file    Attends religious service: Not on file    Active member of club or organization: Not on file    Attends meetings of clubs or organizations: Not on file    Relationship status: Not on file  . Intimate partner violence:    Fear of current or ex partner: Not on file    Emotionally abused: Not on file    Physically abused: Not on file    Forced sexual activity: Not on file  Other Topics Concern  . Not on file  Social History Narrative   Divorced. Lives with parents    Caffeine use: 1  cup coffee 3 times per week   Right handed   Former Therapist, sports   Current Outpatient Medications on File Prior to Visit  Medication Sig Dispense Refill  . ALPRAZolam (XANAX) 0.5 MG tablet Take 1 tablet (0.5 mg total) by mouth daily as needed for anxiety. 30 tablet 2  . apixaban (ELIQUIS) 5 MG TABS tablet Take 1 tablet (5 mg total) by mouth 2 (two) times daily. 60 tablet 1  . atorvastatin (LIPITOR) 10 MG tablet TAKE ONE TABLET BY MOUTH EVERY EVENING 90 tablet 3  . baclofen (LIORESAL) 10 MG tablet Take  1 tablet (10 mg total) by mouth 3 (three) times daily. 30 each   . buPROPion (WELLBUTRIN XL) 300 MG 24 hr tablet TAKE ONE TABLET BY MOUTH EVERY DAY  30 tablet 5  . Cetirizine HCl 10 MG CAPS Take 1 capsule (10 mg total) by mouth daily as needed. Take by mouth. (Patient taking differently: Take 10 mg by mouth every evening. Take by mouth.) 30 capsule   . docusate sodium (COLACE) 100 MG capsule Take 100 mg by mouth 2 (two) times daily.    [name] dronabinol (MARINOL) 2.5 MG capsule Take 2.5 mg by mouth daily as needed.  1  . Dulaglutide (TRULICITY) 0.25 EN/2.7PO SOPN Inject under skin weekly in am 4 pen 5  . empagliflozin (JARDIANCE) 25 MG TABS tablet Take 25 mg by mouth daily. 30 tablet 5  . GRALISE 600 MG TABS Take 1,800 mg by mouth every evening.     . metFORMIN (GLUCOPHAGE) 1000 MG tablet TAKE 1 TABLET BY MOUTH TWICE DAILY WITH A MEAL 180 tablet 3  . metoCLOPramide (REGLAN) 5 MG tablet TAKE 1 TABLET 4 TIMES A DAY BEFORE MEALSAND AT BEDTIME 120 tablet 3  . morphine (MS CONTIN) 60 MG 12 hr tablet Take 60 mg by mouth every 12 (twelve) hours.    . multivitamin-iron-minerals-folic acid (CENTRUM) chewable tablet Chew 1 tablet by mouth daily.    [name] oxyCODONE (OXY IR/ROXICODONE) 5 MG immediate release tablet Take 1 tablet (5 mg total) by mouth every 4 (four) hours as needed for severe pain. Per pain clinic Dr Hardin Negus 30 tablet   . sertraline (ZOLOFT) 100 MG tablet Take 1 tablet (100 mg total) by mouth daily. 30 tablet 5   . traZODone (DESYREL) 100 MG tablet TAKE ONE TABLET BY MOUTH AT BEDTIME AS NEEDED FOR SLEEP 30 tablet 5  . valACYclovir (VALTREX) 1000 MG tablet Take 1 tablet (1,000 mg total) by mouth daily as needed. Must estab w/new provider for future refills 60 tablet 2   No current facility-administered medications on file prior to visit.    Allergies  Allergen Reactions  . Latex Hives   Family History  Adopted: Yes   PE: BP 108/68   Pulse (!) 101   Ht 5\' 5"  (1.651 m)   Wt 218 lb 3.2 oz (99 kg)   SpO2 97%   BMI 36.31 kg/m  Wt Readings from Last 3 Encounters:  01/09/18 218 lb 3.2 oz (99 kg)  12/18/17 216 lb 6.4 oz (98.2 kg)  10/19/17 217 lb 6.4 oz (98.6 kg)   Constitutional: overweight, in NAD Eyes: PERRLA, EOMI, no exophthalmos ENT: moist mucous membranes, no thyromegaly, no cervical lymphadenopathy Cardiovascular: tachycardia, RR, No MRG Respiratory: CTA B Gastrointestinal: abdomen soft, NT, ND, BS+ Musculoskeletal: no deformities, strength intact in all 4 Skin: moist, warm, no rashes Neurological: no tremor with outstretched hands, DTR normal in all 4  ASSESSMENT: 1. DM2, non-insulin-dependent, uncontrolled, without long term complications, but with hyperglycemia  2.  Obesity  3. HL  PLAN:  1. Patient with long-standing, uncontrolled, type 2 diabetes, on oral antidiabetic regimen and now GLP-1 receptor agonist.  After we started Jardiance she developed yeast infections and was treated with Diflucan.  No recent infection since last visit.  She had nausea with Bydureon (however, she feels this was possibly due to postnasal drip), but no problem with Trulicity.  We discussed at length at last visits about nutrition and I suggested to stop eating after dinner she was having a lot of snacks in the  evening.  I also suggested other changes in her diet.  An HbA1c at last visit was 8.3%. - sugars are much better now after adding Trulicity and starting exercise >> will need to stop  Glipizide as her sugars are too low after lunch - I suggested to:  Patient Instructions  Please continue: - Metformin 1000 mg 2x a day, with meals - Jardiance 25 mg daily in a.m. - Trulicity 1.5 mg weekly  Please stop Glipizide.  Please return in 3-4 months with your sugar log.   - today, HbA1c is 6.3% (much better) - continue checking sugars at different times of the day - check 1x a day, rotating checks - advised for yearly eye exams >> she is UTD - Return to clinic in 3 mo with sugar log    2. Obesity -She was doing great from her weight point of view after we started Jardiance.  We also added Trulicity  at last visit, but no more wt loss. - she recently started exercise >> walks fast (with her dog)  - discussed to do this consistently even when weather is not ideal - by using a treadmill  3. HL - Reviewed latest lipid panel 12/2016: LDL greatly improved - Continues the statin without side effects. - will check at next visit as she is now right after lunch  Philemon Kingdom, MD PhD [facility]
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
## 2018-01-09 NOTE — Patient Instructions (Addendum)
Please continue: - Metformin 1000 mg 2x a day, with meals - Jardiance 25 mg daily in a.m. - Trulicity 1.5 mg weekly  Please stop Glipizide.  Please return in 3-4 months with your sugar log.

## 2018-01-10 ENCOUNTER — Other Ambulatory Visit: Payer: Self-pay

## 2018-01-12 ENCOUNTER — Encounter: Payer: Self-pay | Admitting: Internal Medicine

## 2018-01-12 ENCOUNTER — Ambulatory Visit: Payer: Medicare Other | Admitting: Internal Medicine

## 2018-01-12 VITALS — BP 110/68 | HR 107 | Temp 98.8°F | Ht 65.0 in | Wt 222.4 lb

## 2018-01-12 DIAGNOSIS — M545 Low back pain, unspecified: Secondary | ICD-10-CM | POA: Insufficient documentation

## 2018-01-12 DIAGNOSIS — T148XXA Other injury of unspecified body region, initial encounter: Secondary | ICD-10-CM | POA: Diagnosis not present

## 2018-01-12 DIAGNOSIS — M542 Cervicalgia: Secondary | ICD-10-CM | POA: Diagnosis not present

## 2018-01-12 DIAGNOSIS — M25512 Pain in left shoulder: Secondary | ICD-10-CM

## 2018-01-12 MED ORDER — METHYLPREDNISOLONE ACETATE 40 MG/ML IJ SUSP
40.0000 mg | Freq: Once | INTRAMUSCULAR | Status: AC
  Administered 2018-01-12: 40 mg via INTRAMUSCULAR

## 2018-01-12 MED ORDER — METHOCARBAMOL 500 MG PO TABS: 500.0000 mg | ORAL_TABLET | Freq: Two times a day (BID) | ORAL | 1 refills | Status: DC | PRN

## 2018-01-12 NOTE — Progress Notes (Signed)
Pre visit review using our clinic review tool, if applicable. No additional management support is needed unless otherwise documented below in the visit note. 
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
## 2018-01-12 NOTE — Patient Instructions (Addendum)
We will refer physical therapy  Salonpas or Aspercream on your joints  Or topical lidocaine   Cervical Sprain A cervical sprain is a stretch or tear in one or more of the tough, cord-like tissues that connect bones (ligaments) in the neck. Cervical sprains can range from mild to severe. Severe cervical sprains can cause the spinal bones (vertebrae) in the neck to be unstable. This can lead to spinal cord damage and can result in serious nervous system problems. The amount of time that it takes for a cervical sprain to get better depends on the cause and extent of the injury. Most cervical sprains heal in 4-6 weeks. What are the causes? Cervical sprains may be caused by an injury (trauma), such as from a motor vehicle accident, a fall, or sudden forward and backward whipping movement of the head and neck (whiplash injury). Mild cervical sprains may be caused by wear and tear over time, such as from poor posture, sitting in a chair that does not provide support, or looking up or down for long periods of time. What increases the risk? The following factors may make you more likely to develop this condition:  Participating in activities that have a high risk of trauma to the neck. These include contact sports, auto racing, gymnastics, and diving.  Taking risks when driving or riding in a motor vehicle, such as speeding.  Having osteoarthritis of the spine.  Having poor strength and flexibility of the neck.  A previous neck injury.  Having poor posture.  Spending a lot of time in certain positions that put stress on the neck, such as sitting at a computer for long periods of time.  What are the signs or symptoms? Symptoms of this condition include:  Pain, soreness, stiffness, tenderness, swelling, or a burning sensation in the front, back, or sides of the neck.  Sudden tightening of neck muscles that you cannot control (muscle spasms).  Pain in the shoulders or upper back.  Limited  ability to move the neck.  Headache.  Dizziness.  Nausea.  Vomiting.  Weakness, numbness, or tingling in a hand or an arm.  Symptoms may develop right away after injury, or they may develop over a few days. In some cases, symptoms may go away with treatment and return (recur) over time. How is this diagnosed? This condition may be diagnosed based on:  Your medical history.  Your symptoms.  Any recent injuries or known neck problems that you have, such as arthritis in the neck.  A physical exam.  Imaging tests, such as: ? X-rays. ? MRI. ? CT scan.  How is this treated? This condition is treated by resting and icing the injured area and doing physical therapy exercises. Depending on the severity of your condition, treatment may also include:  Keeping your neck in place (immobilized) for periods of time. This may be done using: ? A cervical collar. This supports your chin and the back of your head. ? A cervical traction device. This is a sling that holds up your head. This removes weight and pressure from your neck, and it may help to relieve pain.  Medicines that help to relieve pain and inflammation.  Medicines that help to relax your muscles (muscle relaxants).  Surgery. This is rare.  Follow these instructions at home: If you have a cervical collar:  Wear it as told by your health care provider. Do not remove the collar unless instructed by your health care provider.  Ask your health care  provider before you make any adjustments to your collar.  If you have long hair, keep it outside of the collar.  Ask your health care provider if you can remove the collar for cleaning and bathing. If you are allowed to remove the collar for cleaning or bathing: ? Follow instructions from your health care provider about how to remove the collar safely. ? Clean the collar by wiping it with mild soap and water and drying it completely. ? If your collar has removable pads, remove  them every 1-2 days and wash them by hand with soap and water. Let them air-dry completely before you put them back in the collar. ? Check your skin under the collar for irritation or sores. If you see any, tell your health care provider. Managing pain, stiffness, and swelling  If directed, use a cervical traction device as told by your health care provider.  If directed, apply heat to the affected area before you do your physical therapy or as often as told by your health care provider. Use the heat source that your health care provider recommends, such as a moist heat pack or a heating pad. ? Place a towel between your skin and the heat source. ? Leave the heat on for 20-30 minutes. ? Remove the heat if your skin turns bright red. This is especially important if you are unable to feel pain, heat, or cold. You may have a greater risk of getting burned.  If directed, put ice on the affected area: ? Put ice in a plastic bag. ? Place a towel between your skin and the bag. ? Leave the ice on for 20 minutes, 2-3 times a day. Activity  Do not drive while wearing a cervical collar. If you do not have a cervical collar, ask your health care provider if it is safe to drive while your neck heals.  Do not drive or use heavy machinery while taking prescription pain medicine or muscle relaxants, unless your health care provider approves.  Do not lift anything that is heavier than 10 lb (4.5 kg) until your health care provider tells you that it is safe.  Rest as directed by your health care provider. Avoid positions and activities that make your symptoms worse. Ask your health care provider what activities are safe for you.  If physical therapy was prescribed, do exercises as told by your health care provider or physical therapist. General instructions  Take over-the-counter and prescription medicines only as told by your health care provider.  Do not use any products that contain nicotine or  tobacco, such as cigarettes and e-cigarettes. These can delay healing. If you need help quitting, ask your health care provider.  Keep all follow-up visits as told by your health care provider or physical therapist. This is important. How is this prevented? To prevent a cervical sprain from happening again:  Use and maintain good posture. Make any needed adjustments to your workstation to help you use good posture.  Exercise regularly as directed by your health care provider or physical therapist.  Avoid risky activities that may cause a cervical sprain.  Contact a health care provider if:  You have symptoms that get worse or do not get better after 2 weeks of treatment.  You have pain that gets worse or does not get better with medicine.  You develop new, unexplained symptoms.  You have sores or irritated skin on your neck from wearing your cervical collar. Get help right away if:  You  have severe pain.  You develop numbness, tingling, or weakness in any part of your body.  You cannot move a part of your body (you have paralysis).  You have neck pain along with: ? Severe dizziness. ? Headache. Summary  A cervical sprain is a stretch or tear in one or more of the tough, cord-like tissues that connect bones (ligaments) in the neck.  Cervical sprains may be caused by an injury (trauma), such as from a motor vehicle accident, a fall, or sudden forward and backward whipping movement of the head and neck (whiplash injury).  Symptoms may develop right away after injury, or they may develop over a few days.  This condition is treated by resting and icing the injured area and doing physical therapy exercises. This information is not intended to replace advice given to you by your health care provider. Make sure you discuss any questions you have with your health care provider. Document Released: 06/05/2007 Document Revised: 04/06/2016 Document Reviewed: 04/06/2016 Elsevier  Interactive Patient Education  [employer].

## 2018-01-16 ENCOUNTER — Telehealth: Payer: Self-pay | Admitting: Neurology
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
## 2018-01-16 NOTE — Telephone Encounter (Signed)
Unfortunately headache is common after head trauma and rest is important.  Rest is important in concussion recovery. Recommend shortened work days, working from home if she can, taking frequent breaks. No strenuous activity, limiting computer and reading time. Can try some Tizanidine as this may help nut unfortunately sometimes only time and rest will help after concussion, symptoms may take 3-6 months to slowly resolve but she may feel betteri n a few weeks. I can prescribe Tizanidine and recommend waiting a few weeks to see if thins start getting better. Dizziness, diplopia, nausea, light and sound sensitivity, fatigue, decreased concentration, sleep changes, hearing changes an ringing in the ears are all very common after concussion. Heating pad for muscular neck pain. If she wants to try Tizanidine I am happy to prescribe thanks. Any changes or new or worcening or cocerning symptoms go to ED.

## 2018-01-17 ENCOUNTER — Encounter: Payer: Self-pay | Admitting: Internal Medicine

## 2018-01-17 ENCOUNTER — Other Ambulatory Visit: Payer: Self-pay | Admitting: Family Medicine
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
## 2018-01-17 NOTE — Telephone Encounter (Signed)
Spoke with patient about bringing her in for a visit to discuss everything with Dr. Jaynee Eagles. Pt verbalized understanding and appreciation. Scheduled her for Friday June 7th @ 11:00 with an arrival time of 10:30.

## 2018-01-17 NOTE — Telephone Encounter (Signed)
Please see rx refill request. Thanks-TLG 

## 2018-01-17 NOTE — Telephone Encounter (Signed)
Why don tyou schedule her for an appointment on one of the Friday's we are opening up to discuss, thanks

## 2018-01-20 ENCOUNTER — Encounter: Payer: Self-pay | Admitting: Internal Medicine

## 2018-01-22 ENCOUNTER — Telehealth: Payer: Self-pay | Admitting: Internal Medicine

## 2018-01-22 ENCOUNTER — Other Ambulatory Visit: Payer: Self-pay | Admitting: *Deleted

## 2018-01-22 MED ORDER — APIXABAN 5 MG PO TABS: 5.0000 mg | ORAL_TABLET | Freq: Two times a day (BID) | ORAL | 1 refills | Status: DC

## 2018-01-22 NOTE — Telephone Encounter (Signed)
Patient informed that future refill are to come form her PCP. She verbalized understanding

## 2018-01-22 NOTE — Telephone Encounter (Signed)
Ok to refill this time but this needs to be managed long term by her pcp

## 2018-01-22 NOTE — Telephone Encounter (Signed)
Copied from [place] [id number]. Topic: Quick Communication - See Telephone Encounter >> Jan 22, 2018  4:58 PM Nils Flack wrote: CRM for notification. See Telephone encounter for: 01/22/18. Pharm called  about apixaban (ELIQUIS) 5 MG TABS tablet - they needs clarification on directions and refills  Please call [phone]

## 2018-01-22 NOTE — Telephone Encounter (Signed)
This medication was not filled by Dr. Aundra Dubin.

## 2018-01-23 NOTE — Telephone Encounter (Signed)
Not sure what is needed but the directions are correct. Please advise.

## 2018-01-23 NOTE — Telephone Encounter (Signed)
Comes from hematology/oncology  Pt needs to contact her hematologist oncologist to refill  [employer]

## 2018-01-26 ENCOUNTER — Telehealth: Payer: Self-pay | Admitting: Neurology

## 2018-01-26 ENCOUNTER — Ambulatory Visit: Payer: Medicare Other | Admitting: Neurology

## 2018-01-26 ENCOUNTER — Encounter: Payer: Self-pay | Admitting: Neurology

## 2018-01-26 VITALS — BP 121/80 | HR 91 | Ht 64.0 in | Wt 215.0 lb

## 2018-01-26 DIAGNOSIS — G43711 Chronic migraine without aura, intractable, with status migrainosus: Secondary | ICD-10-CM

## 2018-01-26 MED ORDER — PROPRANOLOL HCL ER 80 MG PO CP24: 80.0000 mg | ORAL_CAPSULE | Freq: Every day | ORAL | 3 refills | Status: DC

## 2018-01-26 NOTE — Telephone Encounter (Signed)
Dr. Jaynee Eagles brought a new Botox patient in I scheduled her for 02/03/18 she has [place].
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
## 2018-01-26 NOTE — Patient Instructions (Signed)
Propranolol extended-release capsules What is this medicine? PROPRANOLOL (proe PRAN oh lole) is a beta-blocker. Beta-blockers reduce the workload on the heart and help it to beat more regularly. This medicine is used to treat high blood pressure, heart muscle disease, and prevent chest pain caused by angina. It is also used to prevent migraine headaches. You should not use this medicine to treat a migraine that has already started. This medicine may be used for other purposes; ask your health care provider or pharmacist if you have questions. COMMON BRAND NAME(S): Inderal LA, Inderal XL, InnoPran XL What should I tell my health care provider before I take this medicine? They need to know if you have any of these conditions: -circulation problems, or blood vessel disease -diabetes -history of heart attack or heart disease, vasospastic angina -kidney disease -liver disease -lung or breathing disease, like asthma or emphysema -pheochromocytoma -slow heart rate -thyroid disease -an unusual or allergic reaction to propranolol, other beta-blockers, medicines, foods, dyes, or preservatives -pregnant or trying to get pregnant -breast-feeding How should I use this medicine? Take this medicine by mouth with a glass of water. Follow the directions on the prescription label. Do not crush or chew. Take your doses at regular intervals. Do not take your medicine more often than directed. Do not stop taking except on the advice of your doctor or health care professional. Talk to your pediatrician regarding the use of this medicine in children. Special care may be needed. Overdosage: If you think you have taken too much of this medicine contact a poison control center or emergency room at once. NOTE: This medicine is only for you. Do not share this medicine with others. What if I miss a dose? If you miss a dose, take it as soon as you can. If it is almost time for your next dose, take only that dose. Do not  take double or extra doses. What may interact with this medicine? Do not take this medicine with any of the following medications: -feverfew -phenothiazines like chlorpromazine, mesoridazine, prochlorperazine, thioridazine This medicine may also interact with the following medications: -aluminum hydroxide gel -antipyrine -antiviral medicines for HIV or AIDS -barbiturates like phenobarbital -certain medicines for blood pressure, heart disease, irregular heart beat -cimetidine -ciprofloxacin -diazepam -fluconazole -haloperidol -isoniazid -medicines for cholesterol like cholestyramine or colestipol -medicines for mental depression -medicines for migraine headache like almotriptan, eletriptan, frovatriptan, naratriptan, rizatriptan, sumatriptan, zolmitriptan -NSAIDs, medicines for pain and inflammation, like ibuprofen or naproxen -phenytoin -rifampin -teniposide -theophylline -thyroid medicines -tolbutamide -warfarin -zileuton This list may not describe all possible interactions. Give your health care provider a list of all the medicines, herbs, non-prescription drugs, or dietary supplements you use. Also tell them if you smoke, drink alcohol, or use illegal drugs. Some items may interact with your medicine. What should I watch for while using this medicine? Visit your doctor or health care professional for regular check ups. Contact your doctor right away if your symptoms worsen. Check your blood pressure and pulse rate regularly. Ask your health care professional what your blood pressure and pulse rate should be, and when you should contact them. Do not stop taking this medicine suddenly. This could lead to serious heart-related effects. You may get drowsy or dizzy. Do not drive, use machinery, or do anything that needs mental alertness until you know how this drug affects you. Do not stand or sit up quickly, especially if you are an older patient. This reduces the risk of dizzy or  fainting spells. Alcohol can   make you more drowsy and dizzy. Avoid alcoholic drinks. This medicine can affect blood sugar levels. If you have diabetes, check with your doctor or health care professional before you change your diet or the dose of your diabetic medicine. Do not treat yourself for coughs, colds, or pain while you are taking this medicine without asking your doctor or health care professional for advice. Some ingredients may increase your blood pressure. What side effects may I notice from receiving this medicine? Side effects that you should report to your doctor or health care professional as soon as possible: -allergic reactions like skin rash, itching or hives, swelling of the face, lips, or tongue -breathing problems -changes in blood sugar -cold hands or feet -difficulty sleeping, nightmares -dry peeling skin -hallucinations -muscle cramps or weakness -slow heart rate -swelling of the legs and ankles -vomiting Side effects that usually do not require medical attention (report to your doctor or health care professional if they continue or are bothersome): -change in sex drive or performance -diarrhea -dry sore eyes -hair loss -nausea -weak or tired This list may not describe all possible side effects. Call your doctor for medical advice about side effects. You may report side effects to FDA at 1-800-FDA-1088. Where should I keep my medicine? Keep out of the reach of children. Store at room temperature between 15 and 30 degrees C (59 and 86 degrees F). Protect from light, moisture and freezing. Keep container tightly closed. Throw away any unused medicine after the expiration date. NOTE: This sheet is a summary. It may not cover all possible information. If you have questions about this medicine, talk to your doctor, pharmacist, or health care provider.  2018 Elsevier/Gold Standard (2013-04-12 14:58:56)  

## 2018-01-29 ENCOUNTER — Encounter: Payer: Self-pay | Admitting: Neurology

## 2018-01-29 DIAGNOSIS — G43711 Chronic migraine without aura, intractable, with status migrainosus: Secondary | ICD-10-CM | POA: Insufficient documentation

## 2018-01-29 NOTE — Telephone Encounter (Signed)
Correction 03/05/18.

## 2018-01-30 NOTE — Telephone Encounter (Signed)
Botox start form initiated.

## 2018-01-31 NOTE — Telephone Encounter (Signed)
I called [facility] and spoke to [facility] and I asked her if Botox J0585 & CPT [phone] needed auth. She informed me it did not need auth. Ref # [id number].

## 2018-02-05 NOTE — Telephone Encounter (Signed)
Noted, thank you

## 2018-02-06 DIAGNOSIS — G890 Central pain syndrome: Secondary | ICD-10-CM | POA: Diagnosis not present

## 2018-02-06 DIAGNOSIS — G894 Chronic pain syndrome: Secondary | ICD-10-CM | POA: Diagnosis not present

## 2018-02-06 DIAGNOSIS — R110 Nausea: Secondary | ICD-10-CM | POA: Diagnosis not present

## 2018-02-06 DIAGNOSIS — Z79891 Long term (current) use of opiate analgesic: Secondary | ICD-10-CM | POA: Diagnosis not present

## 2018-02-07 ENCOUNTER — Encounter: Payer: Self-pay | Admitting: Physical Therapy

## 2018-02-07 ENCOUNTER — Ambulatory Visit: Payer: Medicare Other | Attending: Internal Medicine | Admitting: Physical Therapy

## 2018-02-07 DIAGNOSIS — M542 Cervicalgia: Secondary | ICD-10-CM | POA: Diagnosis not present

## 2018-02-07 DIAGNOSIS — T148XXA Other injury of unspecified body region, initial encounter: Secondary | ICD-10-CM | POA: Insufficient documentation

## 2018-02-07 DIAGNOSIS — M25512 Pain in left shoulder: Secondary | ICD-10-CM

## 2018-02-07 DIAGNOSIS — Z9049 Acquired absence of other specified parts of digestive tract: Secondary | ICD-10-CM | POA: Insufficient documentation

## 2018-02-07 DIAGNOSIS — M545 Low back pain, unspecified: Secondary | ICD-10-CM

## 2018-02-07 DIAGNOSIS — Z9889 Other specified postprocedural states: Secondary | ICD-10-CM | POA: Diagnosis not present
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
## 2018-02-08 DIAGNOSIS — L820 Inflamed seborrheic keratosis: Secondary | ICD-10-CM | POA: Diagnosis not present

## 2018-02-13 ENCOUNTER — Ambulatory Visit: Payer: Medicare Other | Admitting: Physical Therapy

## 2018-02-13 ENCOUNTER — Encounter: Payer: Self-pay | Admitting: Physical Therapy

## 2018-02-13 DIAGNOSIS — M545 Low back pain, unspecified: Secondary | ICD-10-CM

## 2018-02-13 DIAGNOSIS — T148XXA Other injury of unspecified body region, initial encounter: Secondary | ICD-10-CM | POA: Diagnosis not present

## 2018-02-13 DIAGNOSIS — M542 Cervicalgia: Secondary | ICD-10-CM | POA: Diagnosis not present

## 2018-02-13 DIAGNOSIS — M25512 Pain in left shoulder: Secondary | ICD-10-CM

## 2018-02-13 DIAGNOSIS — Z9889 Other specified postprocedural states: Secondary | ICD-10-CM | POA: Diagnosis not present

## 2018-02-13 DIAGNOSIS — Z9049 Acquired absence of other specified parts of digestive tract: Secondary | ICD-10-CM | POA: Diagnosis not present
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
## 2018-02-15 ENCOUNTER — Other Ambulatory Visit: Payer: Self-pay | Admitting: Family Medicine

## 2018-02-15 ENCOUNTER — Ambulatory Visit: Payer: Medicare Other | Admitting: Internal Medicine

## 2018-02-15 ENCOUNTER — Encounter: Payer: Self-pay | Admitting: Internal Medicine

## 2018-02-15 VITALS — BP 124/62 | HR 84 | Temp 97.8°F | Ht 64.0 in | Wt 222.4 lb

## 2018-02-15 DIAGNOSIS — E1165 Type 2 diabetes mellitus with hyperglycemia: Secondary | ICD-10-CM

## 2018-02-15 DIAGNOSIS — I2782 Chronic pulmonary embolism: Secondary | ICD-10-CM

## 2018-02-15 DIAGNOSIS — E119 Type 2 diabetes mellitus without complications: Secondary | ICD-10-CM | POA: Diagnosis not present

## 2018-02-15 DIAGNOSIS — Z0184 Encounter for antibody response examination: Secondary | ICD-10-CM

## 2018-02-15 DIAGNOSIS — M25512 Pain in left shoulder: Secondary | ICD-10-CM | POA: Diagnosis not present

## 2018-02-15 DIAGNOSIS — Z1159 Encounter for screening for other viral diseases: Secondary | ICD-10-CM

## 2018-02-15 DIAGNOSIS — F329 Major depressive disorder, single episode, unspecified: Secondary | ICD-10-CM

## 2018-02-15 DIAGNOSIS — F32A Depression, unspecified: Secondary | ICD-10-CM

## 2018-02-15 DIAGNOSIS — Z13220 Encounter for screening for lipoid disorders: Secondary | ICD-10-CM

## 2018-02-15 DIAGNOSIS — M542 Cervicalgia: Secondary | ICD-10-CM | POA: Diagnosis not present

## 2018-02-15 DIAGNOSIS — G43019 Migraine without aura, intractable, without status migrainosus: Secondary | ICD-10-CM

## 2018-02-15 DIAGNOSIS — Z1329 Encounter for screening for other suspected endocrine disorder: Secondary | ICD-10-CM | POA: Diagnosis not present

## 2018-02-15 DIAGNOSIS — G5603 Carpal tunnel syndrome, bilateral upper limbs: Secondary | ICD-10-CM

## 2018-02-15 DIAGNOSIS — T148XXA Other injury of unspecified body region, initial encounter: Secondary | ICD-10-CM | POA: Diagnosis not present

## 2018-02-15 DIAGNOSIS — M545 Low back pain, unspecified: Secondary | ICD-10-CM

## 2018-02-15 DIAGNOSIS — F419 Anxiety disorder, unspecified: Secondary | ICD-10-CM

## 2018-02-15 DIAGNOSIS — K3184 Gastroparesis: Secondary | ICD-10-CM | POA: Diagnosis not present

## 2018-02-15 LAB — COMPREHENSIVE METABOLIC PANEL
ALT: 17 U/L (ref 0–35)
AST: 14 U/L (ref 0–37)
Albumin: 4.1 g/dL (ref 3.5–5.2)
Alkaline Phosphatase: 86 U/L (ref 39–117)
BUN: 15 mg/dL (ref 6–23)
CO2: 29 mEq/L (ref 19–32)
Calcium: 9.5 mg/dL (ref 8.4–10.5)
Chloride: 101 mEq/L (ref 96–112)
Creatinine, Ser: 0.74 mg/dL (ref 0.40–1.20)
GFR: 89.05 mL/min (ref >60.00)
Glucose, Bld: 133 mg/dL — ABNORMAL HIGH (ref 70–99)
Potassium: 4.2 mEq/L (ref 3.5–5.1)
Sodium: 138 mEq/L (ref 135–145)
Total Bilirubin: 0.2 mg/dL (ref 0.2–1.2)
Total Protein: 6.9 g/dL (ref 6.0–8.3)

## 2018-02-15 LAB — LDL CHOLESTEROL, DIRECT: Direct LDL: 77 mg/dL

## 2018-02-15 LAB — CBC WITH DIFFERENTIAL/PLATELET
Basophils Absolute: 0 10*3/uL (ref 0.0–0.1)
Basophils Relative: 0.6 % (ref 0.0–3.0)
Eosinophils Absolute: 0.3 10*3/uL (ref 0.0–0.7)
Eosinophils Relative: 6 % — ABNORMAL HIGH (ref 0.0–5.0)
HCT: 38.2 % (ref 36.0–46.0)
Hemoglobin: 12.5 g/dL (ref 12.0–15.0)
Lymphocytes Relative: 36.4 % (ref 12.0–46.0)
Lymphs Abs: 2.1 10*3/uL (ref 0.7–4.0)
MCHC: 32.7 g/dL (ref 30.0–36.0)
MCV: 81.9 fl (ref 78.0–100.0)
Monocytes Absolute: 0.5 10*3/uL (ref 0.1–1.0)
Monocytes Relative: 8.1 % (ref 3.0–12.0)
Neutro Abs: 2.9 10*3/uL (ref 1.4–7.7)
Neutrophils Relative %: 48.9 % (ref 43.0–77.0)
Platelets: 256 10*3/uL (ref 150.0–400.0)
RBC: 4.66 Mil/uL (ref 3.87–5.11)
RDW: 15.9 % — ABNORMAL HIGH (ref 11.5–15.5)
WBC: 5.8 10*3/uL (ref 4.0–10.5)

## 2018-02-15 LAB — LIPID PANEL
Cholesterol: 185 mg/dL (ref 0–200)
HDL: 72.2 mg/dL (ref >39.00)
NonHDL: 112.83
Total CHOL/HDL Ratio: 3
Triglycerides: 314 mg/dL — ABNORMAL HIGH (ref 0.0–149.0)
VLDL: 62.8 mg/dL — ABNORMAL HIGH (ref 0.0–40.0)

## 2018-02-15 LAB — TSH: TSH: 2.25 u[IU]/mL (ref 0.35–4.50)

## 2018-02-15 MED ORDER — APIXABAN 5 MG PO TABS: 5.0000 mg | ORAL_TABLET | Freq: Two times a day (BID) | ORAL | 3 refills | Status: DC

## 2018-02-15 MED ORDER — METHOCARBAMOL 500 MG PO TABS: 500.0000 mg | ORAL_TABLET | Freq: Two times a day (BID) | ORAL | 2 refills | Status: DC | PRN
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
## 2018-02-15 NOTE — Patient Instructions (Signed)
F/u end sept early 05/2018  Take care I hope Botox helps   Nausea, Adult Nausea is the feeling of an upset stomach or having to vomit. Nausea on its own is not usually a serious concern, but it may be an early sign of a more serious medical problem. As nausea gets worse, it can lead to vomiting. If vomiting develops, or if you are not able to drink enough fluids, you are at risk of becoming dehydrated. Dehydration can make you tired and thirsty, cause you to have a dry mouth, and decrease how often you urinate. Older adults and people with other diseases or a weak immune system are at higher risk for dehydration. The main goals of treating your nausea are:  To limit repeated nausea episodes.  To prevent vomiting and dehydration.  Follow these instructions at home: Follow instructions from your health care provider about how to care for yourself at home. Eating and drinking Follow these recommendations as told by your health care provider:  Take an oral rehydration solution (ORS). This is a drink that is sold at pharmacies and retail stores.  Drink clear fluids in small amounts as you are able. Clear fluids include water, ice chips, diluted fruit juice, and low-calorie sports drinks.  Eat bland, easy-to-digest foods in small amounts as you are able. These foods include bananas, applesauce, rice, lean meats, toast, and crackers.  Avoid drinking fluids that contain a lot of sugar or caffeine, such as energy drinks, sports drinks, and soda.  Avoid alcohol.  Avoid spicy or fatty foods.  General instructions  Drink enough fluid to keep your urine clear or pale yellow.  Wash your hands often. If soap and water are not available, use hand sanitizer.  Make sure that all people in your household wash their hands well and often.  Rest at home while you recover.  Take over-the-counter and prescription medicines only as told by your health care provider.  Breathe slowly and deeply when you  feel nauseous.  Watch your condition for any changes.  Keep all follow-up visits as told by your health care provider. This is important. Contact a health care provider if:  You have a headache.  You have new symptoms.  Your nausea gets worse.  You have a fever.  You feel light-headed or dizzy.  You vomit.  You cannot keep fluids down. Get help right away if:  You have pain in your chest, neck, arm, or jaw.  You feel extremely weak or you faint.  You have vomit that is bright red or looks like coffee grounds.  You have bloody or black stools or stools that look like tar.  You have a severe headache, a stiff neck, or both.  You have severe pain, cramping, or bloating in your abdomen.  You have a rash.  You have difficulty breathing or are breathing very quickly.  Your heart is beating very quickly.  Your skin feels cold and clammy.  You feel confused.  You have pain when you urinate.  You have signs of dehydration, such as: ? Dark urine, very little, or no urine. ? Cracked lips. ? Dry mouth. ? Sunken eyes. ? Sleepiness. ? Weakness. These symptoms may represent a serious problem that is an emergency. Do not wait to see if the symptoms will go away. Get medical help right away. Call your local emergency services (911 in the [place] Do not drive yourself to the hospital. This information is not intended to replace advice given to  you by your health care provider. Make sure you discuss any questions you have with your health care provider. Document Released: 09/15/2004 Document Revised: 01/11/2016 Document Reviewed: 04/14/2015 Elsevier Interactive Patient Education  [employer].

## 2018-02-15 NOTE — Progress Notes (Signed)
Pre visit review using our clinic review tool, if applicable. No additional management support is needed unless otherwise documented below in the visit note. 

## 2018-02-16 ENCOUNTER — Encounter: Payer: Self-pay | Admitting: Physical Therapy

## 2018-02-16 ENCOUNTER — Ambulatory Visit: Payer: Medicare Other | Admitting: Physical Therapy

## 2018-02-16 ENCOUNTER — Encounter: Payer: Self-pay | Admitting: Internal Medicine

## 2018-02-16 DIAGNOSIS — M545 Low back pain: Secondary | ICD-10-CM | POA: Diagnosis not present

## 2018-02-16 DIAGNOSIS — T148XXA Other injury of unspecified body region, initial encounter: Secondary | ICD-10-CM | POA: Diagnosis not present

## 2018-02-16 DIAGNOSIS — M25512 Pain in left shoulder: Secondary | ICD-10-CM | POA: Diagnosis not present

## 2018-02-16 DIAGNOSIS — Z9889 Other specified postprocedural states: Secondary | ICD-10-CM | POA: Diagnosis not present

## 2018-02-16 DIAGNOSIS — M542 Cervicalgia: Secondary | ICD-10-CM

## 2018-02-16 DIAGNOSIS — Z9049 Acquired absence of other specified parts of digestive tract: Secondary | ICD-10-CM | POA: Diagnosis not present

## 2018-02-16 NOTE — Telephone Encounter (Signed)
TM-Plz see refill req/thx dmf 
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
## 2018-02-19 LAB — MEASLES/MUMPS/RUBELLA IMMUNITY
Mumps IgG: <9 AU/mL — ABNORMAL LOW
Rubella: 2.03 index
Rubeola IgG: 78.1 AU/mL

## 2018-02-19 LAB — HEPATITIS B SURFACE ANTIBODY, QUANTITATIVE: Hepatitis B-Post: <5 m[IU]/mL — ABNORMAL LOW (ref >=10)

## 2018-02-20 ENCOUNTER — Encounter: Payer: Self-pay | Admitting: *Deleted

## 2018-02-20 ENCOUNTER — Encounter: Payer: Self-pay | Admitting: Physical Therapy

## 2018-02-20 ENCOUNTER — Ambulatory Visit: Payer: Medicare Other | Attending: Internal Medicine | Admitting: Physical Therapy

## 2018-02-20 DIAGNOSIS — M25512 Pain in left shoulder: Secondary | ICD-10-CM | POA: Diagnosis not present

## 2018-02-20 DIAGNOSIS — M542 Cervicalgia: Secondary | ICD-10-CM | POA: Diagnosis not present

## 2018-02-20 DIAGNOSIS — T148XXA Other injury of unspecified body region, initial encounter: Secondary | ICD-10-CM | POA: Diagnosis not present

## 2018-02-20 DIAGNOSIS — M545 Low back pain: Secondary | ICD-10-CM | POA: Diagnosis not present
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
## 2018-02-23 ENCOUNTER — Other Ambulatory Visit: Payer: Self-pay | Admitting: Internal Medicine

## 2018-02-23 DIAGNOSIS — E785 Hyperlipidemia, unspecified: Secondary | ICD-10-CM

## 2018-02-23 MED ORDER — ATORVASTATIN CALCIUM 20 MG PO TABS: 20.0000 mg | ORAL_TABLET | Freq: Every evening | ORAL | 2 refills | Status: DC

## 2018-02-26 ENCOUNTER — Encounter: Payer: Self-pay | Admitting: Physical Therapy

## 2018-02-26 ENCOUNTER — Ambulatory Visit: Payer: Medicare Other | Admitting: Physical Therapy

## 2018-02-26 DIAGNOSIS — M542 Cervicalgia: Secondary | ICD-10-CM | POA: Diagnosis not present

## 2018-02-26 DIAGNOSIS — M545 Low back pain, unspecified: Secondary | ICD-10-CM

## 2018-02-26 DIAGNOSIS — T148XXA Other injury of unspecified body region, initial encounter: Secondary | ICD-10-CM | POA: Diagnosis not present

## 2018-02-26 DIAGNOSIS — M25512 Pain in left shoulder: Secondary | ICD-10-CM | POA: Diagnosis not present
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
## 2018-02-28 ENCOUNTER — Ambulatory Visit: Payer: Medicare Other | Admitting: Physical Therapy

## 2018-02-28 ENCOUNTER — Ambulatory Visit: Payer: Medicare Other | Admitting: Neurology

## 2018-03-05 ENCOUNTER — Telehealth: Payer: Self-pay | Admitting: Neurology

## 2018-03-05 ENCOUNTER — Encounter: Payer: Self-pay | Admitting: Neurology

## 2018-03-05 ENCOUNTER — Ambulatory Visit: Payer: Medicare Other | Admitting: Neurology

## 2018-03-05 VITALS — BP 107/70 | HR 82

## 2018-03-05 DIAGNOSIS — G43711 Chronic migraine without aura, intractable, with status migrainosus: Secondary | ICD-10-CM

## 2018-03-05 MED ORDER — GALCANEZUMAB-GNLM 120 MG/ML ~~LOC~~ SOSY: 240.0000 mg | PREFILLED_SYRINGE | SUBCUTANEOUS | 0 refills | Status: DC

## 2018-03-05 MED ORDER — FREMANEZUMAB-VFRM 225 MG/1.5ML ~~LOC~~ SOSY: 225.0000 mg | PREFILLED_SYRINGE | SUBCUTANEOUS | 11 refills | Status: DC

## 2018-03-05 NOTE — Progress Notes (Signed)
Botox- 100 units x 2 vials Botox- 100 units x 2 vials Lot: [id number] Expiration: 09/2020 NDC: 3142-7670-11  Bacteriostatic 0.9% Sodium Chloride- 22mL total Lot: [id number] Expiration: 12/21/2018 NDC: 1164-3539-12  Dx: Q58.346 B/B

## 2018-03-05 NOTE — Progress Notes (Signed)
Consent Form Botulism Toxin Injection For Chronic Migraine  + temples, not masseters, not eyes, not levators (may ask about levators at next appointment)  Reviewed orally with patient, additionally signature is on file:  Botulism toxin has been approved by the Federal drug administration for treatment of chronic migraine. Botulism toxin does not cure chronic migraine and it may not be effective in some patients.  The administration of botulism toxin is accomplished by injecting a small amount of toxin into the muscles of the neck and head. Dosage must be titrated for each individual. Any benefits resulting from botulism toxin tend to wear off after 3 months with a repeat injection required if benefit is to be maintained. Injections are usually done every 3-4 months with maximum effect peak achieved by about 2 or 3 weeks. Botulism toxin is expensive and you should be sure of what costs you will incur resulting from the injection.  The side effects of botulism toxin use for chronic migraine may include:   -Transient, and usually mild, facial weakness with facial injections  -Transient, and usually mild, head or neck weakness with head/neck injections  -Reduction or loss of forehead facial animation due to forehead muscle weakness  -Eyelid drooping  -Dry eye  -Pain at the site of injection or bruising at the site of injection  -Double vision  -Potential unknown long term risks  Contraindications: You should not have Botox if you are pregnant, nursing, allergic to albumin, have an infection, skin condition, or muscle weakness at the site of the injection, or have myasthenia gravis, Lambert-Eaton syndrome, or ALS.  It is also possible that as with any injection, there may be an allergic reaction or no effect from the medication. Reduced effectiveness after repeated injections is sometimes seen and rarely infection at the injection site may occur. All care will be taken to prevent these side  effects. If therapy is given over a long time, atrophy and wasting in the muscle injected may occur. Occasionally the patient's become refractory to treatment because they develop antibodies to the toxin. In this event, therapy needs to be modified.  I have read the above information and consent to the administration of botulism toxin.    BOTOX PROCEDURE NOTE FOR MIGRAINE HEADACHE    Contraindications and precautions discussed with patient(above). Aseptic procedure was observed and patient tolerated procedure. Procedure performed by Dr. Georgia Dom  The condition has existed for more than 6 months, and pt does not have a diagnosis of ALS, Myasthenia Gravis or Lambert-Eaton Syndrome.  Risks and benefits of injections discussed and pt agrees to proceed with the procedure.  Written consent obtained  These injections are medically necessary. Pt  receives good benefits from these injections. These injections do not cause sedations or hallucinations which the oral therapies may cause.  Indication/Diagnosis: chronic migraine BOTOX(J0585) injection was performed according to protocol by [employer]. 200 units of BOTOX was dissolved into 4 cc NS.   NDC: [address]-22   Description of procedure:  The patient was placed in a sitting position. The standard protocol was used for Botox as follows, with 5 units of Botox injected at each site:   -Procerus muscle, midline injection  -Corrugator muscle, bilateral injection  -Frontalis muscle, bilateral injection, with 2 sites each side, medial injection was performed in the upper one third of the frontalis muscle, in the region vertical from the medial inferior edge of the superior orbital rim. The lateral injection was again in the upper one third of the forehead  vertically above the lateral limbus of the cornea, 1.5 cm lateral to the medial injection site.  -Temporalis muscle injection, 4 sites, bilaterally. The first injection was 3 cm above the tragus  of the ear, second injection site was 1.5 cm to 3 cm up from the first injection site in line with the tragus of the ear. The third injection site was 1.5-3 cm forward between the first 2 injection sites. The fourth injection site was 1.5 cm posterior to the second injection site.  -Occipitalis muscle injection, 3 sites, bilaterally. The first injection was done one half way between the occipital protuberance and the tip of the mastoid process behind the ear. The second injection site was done lateral and superior to the first, 1 fingerbreadth from the first injection. The third injection site was 1 fingerbreadth superiorly and medially from the first injection site.  -Cervical paraspinal muscle injection, 2 sites, bilateral knee first injection site was 1 cm from the midline of the cervical spine, 3 cm inferior to the lower border of the occipital protuberance. The second injection site was 1.5 cm superiorly and laterally to the first injection site.  -Trapezius muscle injection was performed at 3 sites, bilaterally. The first injection site was in the upper trapezius muscle halfway between the inflection point of the neck, and the acromion. The second injection site was one half way between the acromion and the first injection site. The third injection was done between the first injection site and the inflection point of the neck.   Will return for repeat injection in 3 months.   A 200 unit sof Botox was used, 155 units were injected, the rest of the Botox was wasted. The patient tolerated the procedure well, there were no complications of the above procedure.

## 2018-03-05 NOTE — Telephone Encounter (Signed)
12 wk Botox inj °

## 2018-03-06 ENCOUNTER — Ambulatory Visit: Payer: Medicare Other | Admitting: Physical Therapy

## 2018-03-06 ENCOUNTER — Telehealth: Payer: Self-pay

## 2018-03-06 ENCOUNTER — Encounter: Payer: Self-pay | Admitting: Physical Therapy

## 2018-03-06 DIAGNOSIS — G890 Central pain syndrome: Secondary | ICD-10-CM | POA: Diagnosis not present

## 2018-03-06 DIAGNOSIS — M542 Cervicalgia: Secondary | ICD-10-CM | POA: Diagnosis not present

## 2018-03-06 DIAGNOSIS — T148XXA Other injury of unspecified body region, initial encounter: Secondary | ICD-10-CM | POA: Diagnosis not present

## 2018-03-06 DIAGNOSIS — G894 Chronic pain syndrome: Secondary | ICD-10-CM | POA: Diagnosis not present

## 2018-03-06 DIAGNOSIS — M25512 Pain in left shoulder: Secondary | ICD-10-CM | POA: Diagnosis not present

## 2018-03-06 DIAGNOSIS — R110 Nausea: Secondary | ICD-10-CM | POA: Diagnosis not present

## 2018-03-06 DIAGNOSIS — Z79891 Long term (current) use of opiate analgesic: Secondary | ICD-10-CM | POA: Diagnosis not present

## 2018-03-06 DIAGNOSIS — M545 Low back pain: Secondary | ICD-10-CM | POA: Diagnosis not present
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
## 2018-03-06 NOTE — Telephone Encounter (Signed)
Received PA request for ajovy from [facility] in [place]. KEY: [id number]. Sent to [facility]. Should have a determination in 3-5 business days.

## 2018-03-06 NOTE — Telephone Encounter (Signed)
I called patient to schedule her next injection, she did not answer so I left a VM asking her to call me back.

## 2018-03-06 NOTE — Telephone Encounter (Signed)
Received this notification from [employer]:   Request Reference Number: [id number]. AJOVY INJ 225/1.5 is approved through 08/21/2018. For further questions, call [phone].   [facility] notified.

## 2018-03-08 ENCOUNTER — Encounter: Payer: Self-pay | Admitting: Physical Therapy

## 2018-03-08 ENCOUNTER — Ambulatory Visit: Payer: Medicare Other | Admitting: Physical Therapy

## 2018-03-08 DIAGNOSIS — M542 Cervicalgia: Secondary | ICD-10-CM

## 2018-03-08 DIAGNOSIS — M545 Low back pain, unspecified: Secondary | ICD-10-CM

## 2018-03-08 DIAGNOSIS — M25512 Pain in left shoulder: Secondary | ICD-10-CM | POA: Diagnosis not present

## 2018-03-08 DIAGNOSIS — T148XXA Other injury of unspecified body region, initial encounter: Secondary | ICD-10-CM | POA: Diagnosis not present
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
## 2018-03-13 ENCOUNTER — Other Ambulatory Visit: Payer: Self-pay

## 2018-03-13 ENCOUNTER — Encounter: Payer: Self-pay | Admitting: Emergency Medicine

## 2018-03-13 ENCOUNTER — Emergency Department
Admission: EM | Admit: 2018-03-13 | Discharge: 2018-03-13 | Disposition: A | Discharge status: Home / Self Care | Payer: Medicare Other | Attending: Emergency Medicine | Admitting: Emergency Medicine

## 2018-03-13 ENCOUNTER — Ambulatory Visit: Payer: Medicare Other | Admitting: Physical Therapy

## 2018-03-13 ENCOUNTER — Emergency Department: Payer: Medicare Other

## 2018-03-13 ENCOUNTER — Ambulatory Visit: Payer: Self-pay

## 2018-03-13 DIAGNOSIS — W01198A Fall on same level from slipping, tripping and stumbling with subsequent striking against other object, initial encounter: Secondary | ICD-10-CM | POA: Diagnosis not present

## 2018-03-13 DIAGNOSIS — S0990XA Unspecified injury of head, initial encounter: Secondary | ICD-10-CM

## 2018-03-13 DIAGNOSIS — Z7984 Long term (current) use of oral hypoglycemic drugs: Secondary | ICD-10-CM | POA: Diagnosis not present

## 2018-03-13 DIAGNOSIS — Z91040 Latex allergy status: Secondary | ICD-10-CM | POA: Insufficient documentation

## 2018-03-13 DIAGNOSIS — S0003XA Contusion of scalp, initial encounter: Secondary | ICD-10-CM | POA: Insufficient documentation

## 2018-03-13 DIAGNOSIS — E119 Type 2 diabetes mellitus without complications: Secondary | ICD-10-CM | POA: Diagnosis not present

## 2018-03-13 DIAGNOSIS — Y92002 Bathroom of unspecified non-institutional (private) residence single-family (private) house as the place of occurrence of the external cause: Secondary | ICD-10-CM | POA: Diagnosis not present

## 2018-03-13 DIAGNOSIS — Z7901 Long term (current) use of anticoagulants: Secondary | ICD-10-CM | POA: Insufficient documentation

## 2018-03-13 DIAGNOSIS — Y93E5 Activity, floor mopping and cleaning: Secondary | ICD-10-CM | POA: Diagnosis not present

## 2018-03-13 DIAGNOSIS — Y998 Other external cause status: Secondary | ICD-10-CM | POA: Diagnosis not present

## 2018-03-13 DIAGNOSIS — M542 Cervicalgia: Secondary | ICD-10-CM | POA: Insufficient documentation

## 2018-03-13 DIAGNOSIS — S199XXA Unspecified injury of neck, initial encounter: Secondary | ICD-10-CM | POA: Diagnosis not present

## 2018-03-13 DIAGNOSIS — Z87891 Personal history of nicotine dependence: Secondary | ICD-10-CM | POA: Diagnosis not present

## 2018-03-13 MED ORDER — CYCLOBENZAPRINE HCL 10 MG PO TABS: 10.0000 mg | ORAL_TABLET | Freq: Three times a day (TID) | ORAL | 0 refills | Status: DC | PRN

## 2018-03-13 MED ORDER — TRAMADOL HCL 50 MG PO TABS: 50.0000 mg | ORAL_TABLET | Freq: Two times a day (BID) | ORAL | 0 refills | Status: DC | PRN

## 2018-03-13 MED ORDER — TRAMADOL HCL 50 MG PO TABS
50.0000 mg | ORAL_TABLET | Freq: Once | ORAL | Status: AC
  Administered 2018-03-13: 50 mg via ORAL
  Filled 2018-03-13: qty 1

## 2018-03-13 MED ORDER — CYCLOBENZAPRINE HCL 10 MG PO TABS
10.0000 mg | ORAL_TABLET | Freq: Once | ORAL | Status: AC
  Administered 2018-03-13: 10 mg via ORAL
  Filled 2018-03-13: qty 1

## 2018-03-13 NOTE — ED Notes (Signed)
See triage note - pt has c-collar in place at this time

## 2018-03-13 NOTE — Telephone Encounter (Signed)
Pt. Called to report falling backwards into the bathtub this morning and hit the back of her head on a tile soap dish, built in to the tub.  Stated she has 2 large lumps on back of head that are about "size of plum", and getting larger.  Unsure if any other injury at this time.  Stated she is on Eliquis for hx of DVT.  C/o headache, dizziness, and nausea.  Alert to person, place and time.  Stated she is alone at this time.  Advised to call EMS at this time, due to being on blood thinner, and increasing size of lump on post. head.  Agreed with plan.   Called pt. back approx. 10 min. post Triage call.  Stated she is enroute to [employer] per private car; brother is driving her to the hospital.  Pt. alert, and very articulate in her verbal response at this time.  Advised will make PCP aware of her injury.  Agreed with plan.     Reason for Disposition . Large swelling or bruise > 2 inches (5 cm)    Reported fell backwards into bathtub and hit back of head very hard on a tiled soap dish, built into bath tub.  On Eliquis.  C/o 2 large lumps on back of head, about size of a plum, and feels as if getting bigger.  Answer Assessment - Initial Assessment Questions 1. MECHANISM: "How did the injury happen?" For falls, ask: "What height did you fall from?" and "What surface did you fall against?"      Fell backward and hit head on tile soap dish in bathtub; reported her head was the 1st point of contact.   2. ONSET: "When did the injury happen?" (Minutes or hours ago)     Approx. 10 min. Ago.  3. NEUROLOGIC SYMPTOMS: "Was there any loss of consciousness?" "Are there any other neurological symptoms?"      Denied loss of consciousness 4. MENTAL STATUS: "Does the person know who he is, who you are, and where he is?"      Alert to person, place and time 5. LOCATION: "What part of the head was hit?"      Back of head 6. SCALP APPEARANCE: "What does the scalp look like? Is it bleeding now?" If so,  ask: "Is it difficult to stop?"      Reported 2 large lumps on back of head 7. SIZE: For cuts, bruises, or swelling, ask: "How large is it?" (e.g., inches or centimeters)      Denied cuts/ lacerations 8. PAIN: "Is there any pain?" If so, ask: "How bad is it?"  (e.g., Scale 1-10; or mild, moderate, severe)     Headache 9. TETANUS: For any breaks in the skin, ask: "When was the last tetanus booster?"    Did not ask this ques. 10. OTHER SYMPTOMS: "Do you have any other symptoms?" (e.g., neck pain, vomiting)       Nausea, and dizziness 11. PREGNANCY: "Is there any chance you are pregnant?" "When was your last menstrual period?"      Did not ask this ques.  Protocols used: HEAD INJURY-A-AH
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
## 2018-03-13 NOTE — ED Notes (Signed)
Patient placed in c-collar due to complaints of neck pain

## 2018-03-13 NOTE — Discharge Instructions (Addendum)
You have one prescription picked up at your pharmacy.

## 2018-03-13 NOTE — ED Triage Notes (Signed)
Patient reports she was cleaning her bathroom and slipped on the floor, hitting her head on the tub. Denies LOC. Reports dizziness and nausea as well as being fatigued. Patient reports taking eliquis.

## 2018-03-15 ENCOUNTER — Ambulatory Visit: Payer: Medicare Other | Admitting: Physical Therapy

## 2018-03-15 ENCOUNTER — Encounter: Payer: Self-pay | Admitting: Physical Therapy

## 2018-03-15 DIAGNOSIS — M25512 Pain in left shoulder: Secondary | ICD-10-CM | POA: Diagnosis not present

## 2018-03-15 DIAGNOSIS — M542 Cervicalgia: Secondary | ICD-10-CM | POA: Diagnosis not present

## 2018-03-15 DIAGNOSIS — T148XXA Other injury of unspecified body region, initial encounter: Secondary | ICD-10-CM | POA: Diagnosis not present

## 2018-03-15 DIAGNOSIS — M545 Low back pain: Secondary | ICD-10-CM | POA: Diagnosis not present
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
## 2018-03-20 ENCOUNTER — Ambulatory Visit: Payer: Medicare Other | Admitting: Physical Therapy

## 2018-03-20 ENCOUNTER — Encounter: Payer: Self-pay | Admitting: Physical Therapy

## 2018-03-20 DIAGNOSIS — T148XXA Other injury of unspecified body region, initial encounter: Secondary | ICD-10-CM | POA: Diagnosis not present

## 2018-03-20 DIAGNOSIS — M25512 Pain in left shoulder: Secondary | ICD-10-CM | POA: Diagnosis not present

## 2018-03-20 DIAGNOSIS — M545 Low back pain: Secondary | ICD-10-CM | POA: Diagnosis not present

## 2018-03-20 DIAGNOSIS — M542 Cervicalgia: Secondary | ICD-10-CM
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
## 2018-03-22 ENCOUNTER — Ambulatory Visit: Payer: Medicare Other | Attending: Internal Medicine | Admitting: Physical Therapy

## 2018-03-22 ENCOUNTER — Encounter: Payer: Self-pay | Admitting: Physical Therapy

## 2018-03-22 DIAGNOSIS — M542 Cervicalgia: Secondary | ICD-10-CM | POA: Insufficient documentation
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
## 2018-03-26 ENCOUNTER — Ambulatory Visit: Payer: Medicare Other | Admitting: Physical Therapy

## 2018-03-26 ENCOUNTER — Encounter: Payer: Self-pay | Admitting: Physical Therapy

## 2018-03-26 DIAGNOSIS — M542 Cervicalgia: Secondary | ICD-10-CM | POA: Diagnosis not present
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
## 2018-03-28 ENCOUNTER — Encounter: Payer: Self-pay | Admitting: Physical Therapy

## 2018-03-28 ENCOUNTER — Ambulatory Visit: Payer: Medicare Other | Admitting: Physical Therapy

## 2018-03-28 DIAGNOSIS — M542 Cervicalgia: Secondary | ICD-10-CM | POA: Diagnosis not present
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
## 2018-04-02 ENCOUNTER — Ambulatory Visit: Payer: Medicare Other | Admitting: Physical Therapy

## 2018-04-04 ENCOUNTER — Encounter: Payer: Self-pay | Admitting: Physical Therapy

## 2018-04-04 ENCOUNTER — Ambulatory Visit: Payer: Medicare Other | Admitting: Physical Therapy

## 2018-04-04 DIAGNOSIS — M542 Cervicalgia: Secondary | ICD-10-CM | POA: Diagnosis not present
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
## 2018-04-05 ENCOUNTER — Encounter: Payer: Medicare Other | Admitting: Physical Therapy

## 2018-04-05 DIAGNOSIS — G890 Central pain syndrome: Secondary | ICD-10-CM | POA: Diagnosis not present

## 2018-04-05 DIAGNOSIS — G894 Chronic pain syndrome: Secondary | ICD-10-CM | POA: Diagnosis not present

## 2018-04-05 DIAGNOSIS — Z79891 Long term (current) use of opiate analgesic: Secondary | ICD-10-CM | POA: Diagnosis not present

## 2018-04-05 DIAGNOSIS — R110 Nausea: Secondary | ICD-10-CM | POA: Diagnosis not present

## 2018-04-10 ENCOUNTER — Encounter: Payer: Self-pay | Admitting: Oncology

## 2018-04-10 ENCOUNTER — Inpatient Hospital Stay: Payer: Medicare Other

## 2018-04-10 ENCOUNTER — Inpatient Hospital Stay: Payer: Medicare Other | Attending: Oncology | Admitting: Oncology

## 2018-04-10 ENCOUNTER — Ambulatory Visit: Payer: Medicare Other | Admitting: Physical Therapy

## 2018-04-10 ENCOUNTER — Other Ambulatory Visit: Payer: Self-pay | Admitting: *Deleted

## 2018-04-10 VITALS — BP 117/81 | HR 83 | Temp 97.4°F | Resp 18 | Ht 64.0 in | Wt 234.0 lb

## 2018-04-10 DIAGNOSIS — D6861 Antiphospholipid syndrome: Secondary | ICD-10-CM | POA: Diagnosis not present

## 2018-04-10 DIAGNOSIS — Z7901 Long term (current) use of anticoagulants: Secondary | ICD-10-CM

## 2018-04-10 DIAGNOSIS — I2699 Other pulmonary embolism without acute cor pulmonale: Secondary | ICD-10-CM

## 2018-04-10 DIAGNOSIS — I2782 Chronic pulmonary embolism: Secondary | ICD-10-CM

## 2018-04-10 DIAGNOSIS — Z7984 Long term (current) use of oral hypoglycemic drugs: Secondary | ICD-10-CM

## 2018-04-10 DIAGNOSIS — Z86711 Personal history of pulmonary embolism: Secondary | ICD-10-CM | POA: Insufficient documentation

## 2018-04-10 DIAGNOSIS — I2609 Other pulmonary embolism with acute cor pulmonale: Secondary | ICD-10-CM

## 2018-04-10 DIAGNOSIS — Z87891 Personal history of nicotine dependence: Secondary | ICD-10-CM | POA: Insufficient documentation

## 2018-04-10 DIAGNOSIS — Z79899 Other long term (current) drug therapy: Secondary | ICD-10-CM | POA: Diagnosis not present

## 2018-04-10 DIAGNOSIS — E119 Type 2 diabetes mellitus without complications: Secondary | ICD-10-CM

## 2018-04-10 LAB — BASIC METABOLIC PANEL
Anion gap: 10 (ref 5–15)
BUN: 18 mg/dL (ref 6–20)
CO2: 24 mmol/L (ref 22–32)
Calcium: 9.2 mg/dL (ref 8.9–10.3)
Chloride: 102 mmol/L (ref 98–111)
Creatinine, Ser: 0.77 mg/dL (ref 0.44–1.00)
GFR calc Af Amer: >60 mL/min (ref >60)
GFR calc non Af Amer: >60 mL/min (ref >60)
Glucose, Bld: 134 mg/dL — ABNORMAL HIGH (ref 70–99)
Potassium: 4.3 mmol/L (ref 3.5–5.1)
Sodium: 136 mmol/L (ref 135–145)

## 2018-04-10 NOTE — Progress Notes (Signed)
Patient c/o pain to left side but nothing new

## 2018-04-11 ENCOUNTER — Ambulatory Visit: Payer: Medicare Other | Admitting: Physical Therapy

## 2018-04-11 ENCOUNTER — Encounter: Payer: Self-pay | Admitting: Physical Therapy

## 2018-04-11 DIAGNOSIS — M542 Cervicalgia: Secondary | ICD-10-CM
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
## 2018-04-12 ENCOUNTER — Other Ambulatory Visit: Payer: Self-pay

## 2018-04-12 ENCOUNTER — Other Ambulatory Visit: Payer: Self-pay | Admitting: Internal Medicine

## 2018-04-12 ENCOUNTER — Telehealth: Payer: Self-pay | Admitting: Internal Medicine

## 2018-04-12 NOTE — Telephone Encounter (Signed)
Patient needs RX for Trulicity sent to [facility] at [address] In [place] ph# [phone] (patient is traveling therefore needs RX to be sent the pharmacy listed herein).
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
## 2018-04-13 MED ORDER — DULAGLUTIDE 0.75 MG/0.5ML ~~LOC~~ SOAJ: SUBCUTANEOUS | 0 refills | Status: DC

## 2018-04-13 NOTE — Telephone Encounter (Signed)
Sent!

## 2018-04-18 ENCOUNTER — Encounter: Payer: Self-pay | Admitting: Physical Therapy

## 2018-04-18 ENCOUNTER — Ambulatory Visit: Payer: Medicare Other | Admitting: Physical Therapy

## 2018-04-18 DIAGNOSIS — M542 Cervicalgia: Secondary | ICD-10-CM | POA: Diagnosis not present
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
## 2018-04-20 ENCOUNTER — Other Ambulatory Visit: Payer: Self-pay | Admitting: Family Medicine

## 2018-04-25 ENCOUNTER — Encounter: Payer: Self-pay | Admitting: Physical Therapy

## 2018-04-25 ENCOUNTER — Ambulatory Visit: Payer: Medicare Other | Attending: Internal Medicine | Admitting: Physical Therapy

## 2018-04-25 DIAGNOSIS — M542 Cervicalgia: Secondary | ICD-10-CM | POA: Diagnosis not present
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
## 2018-04-28 ENCOUNTER — Encounter: Payer: Self-pay | Admitting: Internal Medicine

## 2018-04-30 ENCOUNTER — Telehealth: Payer: Self-pay | Admitting: Internal Medicine

## 2018-04-30 NOTE — Telephone Encounter (Signed)
Paperwork placed in provider's folder.

## 2018-04-30 NOTE — Telephone Encounter (Signed)
Pt came in to drop off paperwork that needs to be filled out.   Please call pt @ [phone]. Form is in color folder up front. Thank you!

## 2018-05-01 ENCOUNTER — Encounter: Payer: Self-pay | Admitting: Physical Therapy

## 2018-05-01 ENCOUNTER — Ambulatory Visit: Payer: Medicare Other | Admitting: Physical Therapy

## 2018-05-01 DIAGNOSIS — M542 Cervicalgia: Secondary | ICD-10-CM | POA: Diagnosis not present
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
## 2018-05-03 ENCOUNTER — Ambulatory Visit: Payer: Medicare Other | Admitting: Physical Therapy

## 2018-05-03 DIAGNOSIS — G890 Central pain syndrome: Secondary | ICD-10-CM | POA: Diagnosis not present

## 2018-05-03 DIAGNOSIS — G894 Chronic pain syndrome: Secondary | ICD-10-CM | POA: Diagnosis not present

## 2018-05-03 DIAGNOSIS — Z79891 Long term (current) use of opiate analgesic: Secondary | ICD-10-CM | POA: Diagnosis not present

## 2018-05-03 DIAGNOSIS — R110 Nausea: Secondary | ICD-10-CM | POA: Diagnosis not present

## 2018-05-03 NOTE — Telephone Encounter (Signed)
Patient calling back about paper work

## 2018-05-03 NOTE — Telephone Encounter (Signed)
Patient was notified.

## 2018-05-03 NOTE — Telephone Encounter (Signed)
Patient calling Aundra Dubin the paperwork is due tomorrow. Would like an update.

## 2018-05-07 ENCOUNTER — Ambulatory Visit: Payer: Medicare Other | Admitting: Internal Medicine

## 2018-05-09 ENCOUNTER — Ambulatory Visit: Payer: Medicare Other | Admitting: Physical Therapy

## 2018-05-15 ENCOUNTER — Ambulatory Visit: Payer: Medicare Other | Admitting: Internal Medicine

## 2018-05-21 ENCOUNTER — Ambulatory Visit: Payer: Medicare Other | Admitting: Internal Medicine

## 2018-05-22 ENCOUNTER — Other Ambulatory Visit: Payer: Self-pay | Admitting: Internal Medicine

## 2018-05-22 DIAGNOSIS — F32A Depression, unspecified: Secondary | ICD-10-CM

## 2018-05-22 DIAGNOSIS — G4700 Insomnia, unspecified: Secondary | ICD-10-CM

## 2018-05-22 DIAGNOSIS — F419 Anxiety disorder, unspecified: Secondary | ICD-10-CM

## 2018-05-22 DIAGNOSIS — F329 Major depressive disorder, single episode, unspecified: Secondary | ICD-10-CM

## 2018-05-22 MED ORDER — BUPROPION HCL ER (XL) 300 MG PO TB24: ORAL_TABLET | ORAL | 12 refills | Status: DC

## 2018-05-22 MED ORDER — ALPRAZOLAM 0.5 MG PO TABS: 0.5000 mg | ORAL_TABLET | Freq: Every day | ORAL | 2 refills | Status: DC | PRN

## 2018-05-22 MED ORDER — SERTRALINE HCL 100 MG PO TABS: 100.0000 mg | ORAL_TABLET | Freq: Every day | ORAL | 12 refills | Status: DC

## 2018-05-22 MED ORDER — TRAZODONE HCL 100 MG PO TABS: 100.0000 mg | ORAL_TABLET | Freq: Every evening | ORAL | 12 refills | Status: DC | PRN

## 2018-05-23 ENCOUNTER — Ambulatory Visit: Payer: Medicare Other | Admitting: Internal Medicine

## 2018-05-28 ENCOUNTER — Telehealth: Payer: Self-pay | Admitting: Neurology

## 2018-05-28 NOTE — Telephone Encounter (Signed)
I called and spoke to [facility], patient is covered and no pre cert required.   [place]

## 2018-05-31 ENCOUNTER — Encounter: Payer: Self-pay | Admitting: Internal Medicine

## 2018-05-31 ENCOUNTER — Ambulatory Visit (INDEPENDENT_AMBULATORY_CARE_PROVIDER_SITE_OTHER): Payer: Medicare Other | Admitting: Internal Medicine

## 2018-05-31 VITALS — BP 120/70 | HR 101 | Ht 64.0 in | Wt 234.0 lb

## 2018-05-31 DIAGNOSIS — Z23 Encounter for immunization: Secondary | ICD-10-CM

## 2018-05-31 DIAGNOSIS — E1165 Type 2 diabetes mellitus with hyperglycemia: Secondary | ICD-10-CM

## 2018-05-31 DIAGNOSIS — Z6836 Body mass index (BMI) 36.0-36.9, adult: Secondary | ICD-10-CM

## 2018-05-31 DIAGNOSIS — E66812 Obesity, class 2: Secondary | ICD-10-CM

## 2018-05-31 DIAGNOSIS — E785 Hyperlipidemia, unspecified: Secondary | ICD-10-CM | POA: Diagnosis not present

## 2018-05-31 LAB — POCT GLYCOSYLATED HEMOGLOBIN (HGB A1C): Hemoglobin A1C: 8 % — AB (ref 4.0–5.6)

## 2018-05-31 MED ORDER — GLIPIZIDE 5 MG PO TABS: 5.0000 mg | ORAL_TABLET | Freq: Every day | ORAL | 3 refills | Status: DC

## 2018-05-31 MED ORDER — DULAGLUTIDE 1.5 MG/0.5ML ~~LOC~~ SOAJ
1.5000 mg | SUBCUTANEOUS | 3 refills | Status: DC
Start: 2018-05-31 — End: 2019-06-12

## 2018-05-31 MED ORDER — EMPAGLIFLOZIN 25 MG PO TABS: 25.0000 mg | ORAL_TABLET | Freq: Every day | ORAL | 3 refills | Status: DC

## 2018-05-31 NOTE — Patient Instructions (Addendum)
Please continue: - Metformin 1000 mg 2x a day, with meals - Jardiance 25 mg daily in a.m. - Trulicity 1.5 mg weekly  Please restart: - Glipizide 5 mg 30 min before dinner  Please return in 3 months with your sugar log.
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
## 2018-06-01 ENCOUNTER — Ambulatory Visit (INDEPENDENT_AMBULATORY_CARE_PROVIDER_SITE_OTHER): Payer: Medicare Other | Admitting: Neurology

## 2018-06-01 DIAGNOSIS — G43711 Chronic migraine without aura, intractable, with status migrainosus: Secondary | ICD-10-CM | POA: Diagnosis not present

## 2018-06-01 NOTE — Progress Notes (Signed)
Botox-100unitsx2 vials Lot: [id number] Expiration: 11/2020 NDC: 0816-8387-06 [id number]  0.9% Sodium Chloride- 30mL total Lot: [id number] Expiration: 02/2019 NDC: 761915-502-71  Dx: A23.200 B/B

## 2018-06-01 NOTE — Progress Notes (Signed)
She only had 2 headaches in September, she went almost all month headache and migraine free which is  Exceptional. She has "never experienced relied like this". This is hre second injection. Baseline is 15 migraine days a month  Consent Form Botulism Toxin Injection For Chronic Migraine  + temples, not masseters, not eyes, not levators (may ask about levators at next appointment)  Reviewed orally with patient, additionally signature is on file:  Botulism toxin has been approved by the [employer] administration for treatment of chronic migraine. Botulism toxin does not cure chronic migraine and it may not be effective in some patients.  The administration of botulism toxin is accomplished by injecting a small amount of toxin into the muscles of the neck and head. Dosage must be titrated for each individual. Any benefits resulting from botulism toxin tend to wear off after 3 months with a repeat injection required if benefit is to be maintained. Injections are usually done every 3-4 months with maximum effect peak achieved by about 2 or 3 weeks. Botulism toxin is expensive and you should be sure of what costs you will incur resulting from the injection.  The side effects of botulism toxin use for chronic migraine may include:   -Transient, and usually mild, facial weakness with facial injections  -Transient, and usually mild, head or neck weakness with head/neck injections  -Reduction or loss of forehead facial animation due to forehead muscle weakness  -Eyelid drooping  -Dry eye  -Pain at the site of injection or bruising at the site of injection  -Double vision  -Potential unknown long term risks  Contraindications: You should not have Botox if you are pregnant, nursing, allergic to albumin, have an infection, skin condition, or muscle weakness at the site of the injection, or have myasthenia gravis, Lambert-Eaton syndrome, or ALS.  It is also possible that as with any injection,  there may be an allergic reaction or no effect from the medication. Reduced effectiveness after repeated injections is sometimes seen and rarely infection at the injection site may occur. All care will be taken to prevent these side effects. If therapy is given over a long time, atrophy and wasting in the muscle injected may occur. Occasionally the patient's become refractory to treatment because they develop antibodies to the toxin. In this event, therapy needs to be modified.  I have read the above information and consent to the administration of botulism toxin.    BOTOX PROCEDURE NOTE FOR MIGRAINE HEADACHE    Contraindications and precautions discussed with patient(above). Aseptic procedure was observed and patient tolerated procedure. Procedure performed by Dr. Georgia Dom  The condition has existed for more than 6 months, and pt does not have a diagnosis of ALS, Myasthenia Gravis or Lambert-Eaton Syndrome.  Risks and benefits of injections discussed and pt agrees to proceed with the procedure.  Written consent obtained  These injections are medically necessary. Pt  receives good benefits from these injections. These injections do not cause sedations or hallucinations which the oral therapies may cause.  Indication/Diagnosis: chronic migraine BOTOX(J0585) injection was performed according to protocol by [employer]. 200 units of BOTOX was dissolved into 4 cc NS.   NDC: [address]-38   Description of procedure:  The patient was placed in a sitting position. The standard protocol was used for Botox as follows, with 5 units of Botox injected at each site:   -Procerus muscle, midline injection  -Corrugator muscle, bilateral injection  -Frontalis muscle, bilateral injection, with 2 sites each side,  medial injection was performed in the upper one third of the frontalis muscle, in the region vertical from the medial inferior edge of the superior orbital rim. The lateral injection was again in  the upper one third of the forehead vertically above the lateral limbus of the cornea, 1.5 cm lateral to the medial injection site.  -Temporalis muscle injection, 4 sites, bilaterally. The first injection was 3 cm above the tragus of the ear, second injection site was 1.5 cm to 3 cm up from the first injection site in line with the tragus of the ear. The third injection site was 1.5-3 cm forward between the first 2 injection sites. The fourth injection site was 1.5 cm posterior to the second injection site.  -Occipitalis muscle injection, 3 sites, bilaterally. The first injection was done one half way between the occipital protuberance and the tip of the mastoid process behind the ear. The second injection site was done lateral and superior to the first, 1 fingerbreadth from the first injection. The third injection site was 1 fingerbreadth superiorly and medially from the first injection site.  -Cervical paraspinal muscle injection, 2 sites, bilateral knee first injection site was 1 cm from the midline of the cervical spine, 3 cm inferior to the lower border of the occipital protuberance. The second injection site was 1.5 cm superiorly and laterally to the first injection site.  -Trapezius muscle injection was performed at 3 sites, bilaterally. The first injection site was in the upper trapezius muscle halfway between the inflection point of the neck, and the acromion. The second injection site was one half way between the acromion and the first injection site. The third injection was done between the first injection site and the inflection point of the neck.   Will return for repeat injection in 3 months.   A 200 unit sof Botox was used, 155 units were injected, the rest of the Botox was wasted. The patient tolerated the procedure well, there were no complications of the above procedure.

## 2018-06-04 DIAGNOSIS — Z79891 Long term (current) use of opiate analgesic: Secondary | ICD-10-CM | POA: Diagnosis not present

## 2018-06-04 DIAGNOSIS — G890 Central pain syndrome: Secondary | ICD-10-CM | POA: Diagnosis not present

## 2018-06-04 DIAGNOSIS — R110 Nausea: Secondary | ICD-10-CM | POA: Diagnosis not present

## 2018-06-04 DIAGNOSIS — G894 Chronic pain syndrome: Secondary | ICD-10-CM | POA: Diagnosis not present

## 2018-06-05 ENCOUNTER — Other Ambulatory Visit: Payer: Self-pay | Admitting: Neurology

## 2018-06-05 DIAGNOSIS — G43711 Chronic migraine without aura, intractable, with status migrainosus: Secondary | ICD-10-CM

## 2018-06-06 DIAGNOSIS — N911 Secondary amenorrhea: Secondary | ICD-10-CM | POA: Diagnosis not present

## 2018-06-07 ENCOUNTER — Telehealth: Payer: Self-pay | Admitting: Internal Medicine

## 2018-06-07 NOTE — Telephone Encounter (Signed)
Patient said the price for her Trulicity has gone up to $117.  Patient said she can't afford the medication. Can patient be switched to a cheaper alternative? Patient uses [facility],[place].

## 2018-06-07 NOTE — Telephone Encounter (Signed)
Patient called back. She called the pharmacy again and they told her the medication would be $40.  She said to disregard the previous message.

## 2018-06-12 DIAGNOSIS — E113293 Type 2 diabetes mellitus with mild nonproliferative diabetic retinopathy without macular edema, bilateral: Secondary | ICD-10-CM | POA: Diagnosis not present

## 2018-06-12 LAB — HM DIABETES EYE EXAM

## 2018-06-19 ENCOUNTER — Other Ambulatory Visit: Payer: Self-pay | Admitting: Internal Medicine

## 2018-06-29 ENCOUNTER — Ambulatory Visit: Payer: Medicare Other | Admitting: Internal Medicine

## 2018-06-29 ENCOUNTER — Ambulatory Visit: Payer: Medicare Other

## 2018-07-03 DIAGNOSIS — R110 Nausea: Secondary | ICD-10-CM | POA: Diagnosis not present

## 2018-07-03 DIAGNOSIS — Z79891 Long term (current) use of opiate analgesic: Secondary | ICD-10-CM | POA: Diagnosis not present

## 2018-07-03 DIAGNOSIS — G894 Chronic pain syndrome: Secondary | ICD-10-CM | POA: Diagnosis not present

## 2018-07-03 DIAGNOSIS — G890 Central pain syndrome: Secondary | ICD-10-CM | POA: Diagnosis not present

## 2018-07-27 ENCOUNTER — Ambulatory Visit: Payer: Medicare Other | Admitting: Internal Medicine

## 2018-07-27 ENCOUNTER — Encounter: Payer: Self-pay | Admitting: Internal Medicine

## 2018-07-27 VITALS — BP 134/80 | HR 97 | Temp 98.6°F | Ht 64.0 in | Wt 241.8 lb

## 2018-07-27 DIAGNOSIS — I2782 Chronic pulmonary embolism: Secondary | ICD-10-CM

## 2018-07-27 DIAGNOSIS — F411 Generalized anxiety disorder: Secondary | ICD-10-CM | POA: Diagnosis not present

## 2018-07-27 DIAGNOSIS — K30 Functional dyspepsia: Secondary | ICD-10-CM | POA: Insufficient documentation

## 2018-07-27 DIAGNOSIS — Z86711 Personal history of pulmonary embolism: Secondary | ICD-10-CM | POA: Diagnosis not present

## 2018-07-27 DIAGNOSIS — E785 Hyperlipidemia, unspecified: Secondary | ICD-10-CM

## 2018-07-27 DIAGNOSIS — F419 Anxiety disorder, unspecified: Secondary | ICD-10-CM

## 2018-07-27 DIAGNOSIS — F32A Depression, unspecified: Secondary | ICD-10-CM

## 2018-07-27 DIAGNOSIS — M79602 Pain in left arm: Secondary | ICD-10-CM | POA: Diagnosis not present

## 2018-07-27 DIAGNOSIS — G8929 Other chronic pain: Secondary | ICD-10-CM

## 2018-07-27 DIAGNOSIS — F329 Major depressive disorder, single episode, unspecified: Secondary | ICD-10-CM

## 2018-07-27 DIAGNOSIS — I2609 Other pulmonary embolism with acute cor pulmonale: Secondary | ICD-10-CM

## 2018-07-27 DIAGNOSIS — E1165 Type 2 diabetes mellitus with hyperglycemia: Secondary | ICD-10-CM | POA: Diagnosis not present

## 2018-07-27 DIAGNOSIS — M25512 Pain in left shoulder: Secondary | ICD-10-CM

## 2018-07-27 DIAGNOSIS — E559 Vitamin D deficiency, unspecified: Secondary | ICD-10-CM
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
## 2018-07-27 NOTE — Patient Instructions (Addendum)
Cinnamon supplements can help with blood sugar  Increase physical activity    Gas X and pepcid AC for gas  MRI left humerus see what that shows.     Indigestion Indigestion is a feeling of pain, discomfort, burning, or fullness in the upper part of your abdomen. It can come and go. It may occur frequently or rarely. Indigestion tends to occur while you are eating or right after you have finished eating. It may be worse at night and while bending over or lying down. Follow these instructions at home: Take these actions to decrease your pain or discomfort and to help avoid complications. Diet  Follow a diet as recommended by your health care provider. This may involve avoiding foods and drinks such as: ? Coffee and tea (with or without caffeine). ? Drinks that contain alcohol. ? Energy drinks and sports drinks. ? Carbonated drinks or sodas. ? Chocolate and cocoa. ? Peppermint and mint flavorings. ? Garlic and onions. ? Horseradish. ? Spicy and acidic foods, including peppers, chili powder, curry powder, vinegar, hot sauces, and barbecue sauce. ? Citrus fruit juices and citrus fruits, such as oranges, lemons, and limes. ? Tomato-based foods, such as red sauce, chili, salsa, and pizza with red sauce. ? Fried and fatty foods, such as donuts, french fries, potato chips, and high-fat dressings. ? High-fat meats, such as hot dogs and fatty cuts of red and white meats, such as rib eye steak, sausage, ham, and bacon. ? High-fat dairy items, such as whole milk, butter, and cream cheese.  Eat small, frequent meals instead of large meals.  Avoid drinking large amounts of liquid with your meals.  Avoid eating meals during the 2-3 hours before bedtime.  Avoid lying down right after you eat.  Do not exercise right after you eat. General instructions  Pay attention to any changes in your symptoms.  Take over-the-counter and prescription medicines only as told by your health care provider.  Do not take aspirin, ibuprofen, or other NSAIDs unless your health care provider told you to do so.  Do not use any tobacco products, including cigarettes, chewing tobacco, and e-cigarettes. If you need help quitting, ask your health care provider.  Wear loose-fitting clothing. Do not wear anything tight around your waist that causes pressure on your abdomen.  Raise (elevate) the head of your bed about 6 inches (15 cm).  Try to reduce your stress, such as with yoga or meditation. If you need help reducing stress, ask your health care provider.  If you are overweight, reduce your weight to an amount that is healthy for you. Ask your health care provider for guidance about a safe weight loss goal.  Keep all follow-up visits as told by your health care provider. This is important. Contact a health care provider if:  You have new symptoms.  You have unexplained weight loss.  You have difficulty swallowing, or it hurts to swallow.  Your symptoms do not improve with treatment.  Your symptoms last for more than two days.  You have a fever.  You vomit. Get help right away if:  You have pain in your arms, neck, jaw, teeth, or back.  You feel sweaty, dizzy, or light-headed.  You faint.  You have chest pain or shortness of breath.  You cannot stop vomiting, or you vomit blood.  Your stool is bloody or black.  You have severe pain in your abdomen. This information is not intended to replace advice given to you by your health  care provider. Make sure you discuss any questions you have with your health care provider. Document Released: 09/15/2004 Document Revised: 01/14/2016 Document Reviewed: 12/03/2014 Elsevier Interactive Patient Education  [employer].

## 2018-07-27 NOTE — Progress Notes (Signed)
Pre visit review using our clinic review tool, if applicable. No additional management support is needed unless otherwise documented below in the visit note. 

## 2018-07-30 ENCOUNTER — Encounter: Payer: Self-pay | Admitting: Internal Medicine

## 2018-07-30 ENCOUNTER — Ambulatory Visit (INDEPENDENT_AMBULATORY_CARE_PROVIDER_SITE_OTHER): Payer: Medicare Other

## 2018-07-30 VITALS — BP 118/64 | HR 83 | Temp 98.3°F | Resp 15 | Ht 65.0 in | Wt 239.4 lb

## 2018-07-30 DIAGNOSIS — Z0000 Encounter for general adult medical examination without abnormal findings: Secondary | ICD-10-CM

## 2018-07-30 NOTE — Patient Instructions (Addendum)
  Ms. Broerman , Thank you for taking time to come for your Medicare Wellness Visit. I appreciate your ongoing commitment to your health goals. Please review the following plan we discussed and let me know if I can assist you in the future.   Keep all routine scheduled appointments.   Follow up as needed.    Bring a copy of your [place] and/or Living Will to be scanned into chart upon completion.  Have a great day!  These are the goals we discussed: Goals      Patient Stated   . Healthy Lifestyle (pt-stated)     Lose weight  Healthier eating habits. Low carb/high protein.  Stay active        This is a list of the screening recommended for you and due dates:  Health Maintenance  Topic Date Due  . Tetanus Vaccine  06/07/2016  . Complete foot exam   01/19/2018  . Hemoglobin A1C  11/30/2018  . Urine Protein Check  12/19/2018  . Eye exam for diabetics  06/13/2019  . Mammogram  11/14/2019  . Pap Smear  10/04/2020  . Flu Shot  Completed  . Pneumococcal vaccine  Completed  . HIV Screening  Completed
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
## 2018-07-31 ENCOUNTER — Telehealth: Payer: Self-pay

## 2018-07-31 NOTE — Telephone Encounter (Signed)
Ajovy PA done on cover my meds. Pt cannot take aimovig has a latex allergy which is the preferred with her insurance.

## 2018-07-31 NOTE — Telephone Encounter (Signed)
RN receive fax from [facility] that medication was approve with the previous cert dates from 03/8501 to 08/21/2018. Rn call [employer] [facility] that the Ajovy was mistakenly approve with old cert dates. Rn stated that is incorrect.. The rep stated the PA has to be done again because of the wrong cert dates. Rn stated the error was made on [employer] [facility] to correct the approval dates. Rn recommend [employer] review the PA on file and approve or deny it. Rn stated its a continuation from 02/2018. The rep stated she will reedited,and submit PA today. Reference number of [id number]. Rn was told to call tomorrow about approval or denial.

## 2018-08-01 ENCOUNTER — Other Ambulatory Visit: Payer: Self-pay | Admitting: Internal Medicine

## 2018-08-01 DIAGNOSIS — F329 Major depressive disorder, single episode, unspecified: Secondary | ICD-10-CM

## 2018-08-01 DIAGNOSIS — G890 Central pain syndrome: Secondary | ICD-10-CM | POA: Diagnosis not present

## 2018-08-01 DIAGNOSIS — F32A Depression, unspecified: Secondary | ICD-10-CM

## 2018-08-01 DIAGNOSIS — R110 Nausea: Secondary | ICD-10-CM | POA: Diagnosis not present

## 2018-08-01 DIAGNOSIS — G894 Chronic pain syndrome: Secondary | ICD-10-CM | POA: Diagnosis not present

## 2018-08-01 DIAGNOSIS — Z79891 Long term (current) use of opiate analgesic: Secondary | ICD-10-CM | POA: Diagnosis not present

## 2018-08-01 DIAGNOSIS — F419 Anxiety disorder, unspecified: Secondary | ICD-10-CM

## 2018-08-01 DIAGNOSIS — F439 Reaction to severe stress, unspecified: Secondary | ICD-10-CM

## 2018-08-01 NOTE — Telephone Encounter (Signed)
RN call [employer] [facility] to inquire about PA that was approve for wrong cert dates. RN call [phone]. The rep stated it was cancel by [employer] because it does not end till 08/21/2018. RN stated our office receive a fax from cover my meds to redo PA for January 2020. PA was resubmitted from 08/22/2008 to 08/22/2019. PA authorization number is [id number].

## 2018-08-03 ENCOUNTER — Encounter: Payer: Self-pay | Admitting: Internal Medicine

## 2018-08-06 NOTE — Addendum Note (Signed)
Addended by: Orland Mustard on: 08/06/2018 11:16 AM   Modules accepted: Orders

## 2018-08-21 ENCOUNTER — Ambulatory Visit: Payer: Medicare Other

## 2018-08-23 ENCOUNTER — Ambulatory Visit
Admission: RE | Admit: 2018-08-23 | Discharge: 2018-08-23 | Disposition: A | Discharge status: Home / Self Care | Payer: Medicare Other | Source: Ambulatory Visit | Attending: Internal Medicine | Admitting: Internal Medicine

## 2018-08-23 DIAGNOSIS — S4992XA Unspecified injury of left shoulder and upper arm, initial encounter: Secondary | ICD-10-CM | POA: Diagnosis not present

## 2018-08-23 DIAGNOSIS — M79602 Pain in left arm: Secondary | ICD-10-CM | POA: Diagnosis not present

## 2018-08-23 DIAGNOSIS — M7989 Other specified soft tissue disorders: Secondary | ICD-10-CM | POA: Diagnosis not present

## 2018-08-23 DIAGNOSIS — G8929 Other chronic pain: Secondary | ICD-10-CM

## 2018-08-23 DIAGNOSIS — M25512 Pain in left shoulder: Secondary | ICD-10-CM | POA: Insufficient documentation

## 2018-08-23 LAB — POCT I-STAT CREATININE: Creatinine, Ser: 0.7 mg/dL (ref 0.44–1.00)

## 2018-08-23 MED ORDER — GADOBUTROL 1 MMOL/ML IV SOLN
10.0000 mL | Freq: Once | INTRAVENOUS | Status: AC | PRN
  Administered 2018-08-23: 10 mL via INTRAVENOUS

## 2018-08-30 DIAGNOSIS — R110 Nausea: Secondary | ICD-10-CM | POA: Diagnosis not present

## 2018-08-30 DIAGNOSIS — Z79891 Long term (current) use of opiate analgesic: Secondary | ICD-10-CM | POA: Diagnosis not present

## 2018-08-30 DIAGNOSIS — G894 Chronic pain syndrome: Secondary | ICD-10-CM | POA: Diagnosis not present

## 2018-08-30 DIAGNOSIS — G890 Central pain syndrome: Secondary | ICD-10-CM | POA: Diagnosis not present

## 2018-09-03 ENCOUNTER — Telehealth: Payer: Self-pay | Admitting: Neurology

## 2018-09-03 NOTE — Telephone Encounter (Signed)
I called [employer] to check that the patient was active and no precert was required. Codes are valid and NPR. BRA#[id number].

## 2018-09-07 ENCOUNTER — Ambulatory Visit: Payer: Medicare Other | Admitting: Neurology

## 2018-09-07 DIAGNOSIS — G43711 Chronic migraine without aura, intractable, with status migrainosus: Secondary | ICD-10-CM | POA: Diagnosis not present

## 2018-09-07 NOTE — Progress Notes (Signed)
Botox- 100 units x 2 vials Lot: [id number] Expiration: 12/2020 NDC: 2820-8138-87 &  Lot: [id number] Expiration: 01/2021 NDC: 1855-0158-68  Bacteriostatic 0.9% Sodium Chloride- 46mL total Lot: [id number] Expiration: 05/23/2019 NDC: 5521-7471-59  Dx: B39.672 B/B

## 2018-09-10 NOTE — Progress Notes (Signed)
Consent Form Botulism Toxin Injection For Chronic Migraine   Interval history: Continues to do extremely well > 75% decrease freq headaches. no temples, not masseters, not eyes, not levators   Reviewed orally with patient, additionally signature is on file:  Botulism toxin has been approved by the Federal drug administration for treatment of chronic migraine. Botulism toxin does not cure chronic migraine and it may not be effective in some patients.  The administration of botulism toxin is accomplished by injecting a small amount of toxin into the muscles of the neck and head. Dosage must be titrated for each individual. Any benefits resulting from botulism toxin tend to wear off after 3 months with a repeat injection required if benefit is to be maintained. Injections are usually done every 3-4 months with maximum effect peak achieved by about 2 or 3 weeks. Botulism toxin is expensive and you should be sure of what costs you will incur resulting from the injection.  The side effects of botulism toxin use for chronic migraine may include:   -Transient, and usually mild, facial weakness with facial injections  -Transient, and usually mild, head or neck weakness with head/neck injections  -Reduction or loss of forehead facial animation due to forehead muscle weakness  -Eyelid drooping  -Dry eye  -Pain at the site of injection or bruising at the site of injection  -Double vision  -Potential unknown long term risks  Contraindications: You should not have Botox if you are pregnant, nursing, allergic to albumin, have an infection, skin condition, or muscle weakness at the site of the injection, or have myasthenia gravis, Lambert-Eaton syndrome, or ALS.  It is also possible that as with any injection, there may be an allergic reaction or no effect from the medication. Reduced effectiveness after repeated injections is sometimes seen and rarely infection at the injection site may occur. All  care will be taken to prevent these side effects. If therapy is given over a long time, atrophy and wasting in the muscle injected may occur. Occasionally the patient's become refractory to treatment because they develop antibodies to the toxin. In this event, therapy needs to be modified.  I have read the above information and consent to the administration of botulism toxin.    BOTOX PROCEDURE NOTE FOR MIGRAINE HEADACHE    Contraindications and precautions discussed with patient(above). Aseptic procedure was observed and patient tolerated procedure. Procedure performed by Dr. Georgia Dom  The condition has existed for more than 6 months, and pt does not have a diagnosis of ALS, Myasthenia Gravis or Lambert-Eaton Syndrome.  Risks and benefits of injections discussed and pt agrees to proceed with the procedure.  Written consent obtained  These injections are medically necessary. Pt  receives good benefits from these injections. These injections do not cause sedations or hallucinations which the oral therapies may cause.  Description of procedure:  The patient was placed in a sitting position. The standard protocol was used for Botox as follows, with 5 units of Botox injected at each site:   -Procerus muscle, midline injection  -Corrugator muscle, bilateral injection  -Frontalis muscle, bilateral injection, with 2 sites each side, medial injection was performed in the upper one third of the frontalis muscle, in the region vertical from the medial inferior edge of the superior orbital rim. The lateral injection was again in the upper one third of the forehead vertically above the lateral limbus of the cornea, 1.5 cm lateral to the medial injection site.    -Temporalis muscle  injection, 5 sites, bilaterally. The first injection was 3 cm above the tragus of the ear, second injection site was 1.5 cm to 3 cm up from the first injection site in line with the tragus of the ear. The third injection  site was 1.5-3 cm forward between the first 2 injection sites. The fourth injection site was 1.5 cm posterior to the second injection site.   -Occipitalis muscle injection, 3 sites, bilaterally. The first injection was done one half way between the occipital protuberance and the tip of the mastoid process behind the ear. The second injection site was done lateral and superior to the first, 1 fingerbreadth from the first injection. The third injection site was 1 fingerbreadth superiorly and medially from the first injection site.  -Cervical paraspinal muscle injection, 2 sites, bilateral knee first injection site was 1 cm from the midline of the cervical spine, 3 cm inferior to the lower border of the occipital protuberance. The second injection site was 1.5 cm superiorly and laterally to the first injection site.  -Trapezius muscle injection was performed at 3 sites, bilaterally. The first injection site was in the upper trapezius muscle halfway between the inflection point of the neck, and the acromion. The second injection site was one half way between the acromion and the first injection site. The third injection was done between the first injection site and the inflection point of the neck.   Will return for repeat injection in 3 months.   A 200 unit sof Botox was used, any Botox not injected was wasted. The patient tolerated the procedure well, there were no complications of the above procedure.

## 2018-09-12 ENCOUNTER — Ambulatory Visit (INDEPENDENT_AMBULATORY_CARE_PROVIDER_SITE_OTHER): Payer: Medicare Other | Admitting: Psychology

## 2018-09-12 DIAGNOSIS — F411 Generalized anxiety disorder: Secondary | ICD-10-CM

## 2018-09-12 DIAGNOSIS — F339 Major depressive disorder, recurrent, unspecified: Secondary | ICD-10-CM

## 2018-09-13 ENCOUNTER — Other Ambulatory Visit: Payer: Medicare Other

## 2018-09-19 ENCOUNTER — Other Ambulatory Visit (INDEPENDENT_AMBULATORY_CARE_PROVIDER_SITE_OTHER): Payer: 59

## 2018-09-19 DIAGNOSIS — E1165 Type 2 diabetes mellitus with hyperglycemia: Secondary | ICD-10-CM

## 2018-09-19 DIAGNOSIS — E559 Vitamin D deficiency, unspecified: Secondary | ICD-10-CM

## 2018-09-19 DIAGNOSIS — M79602 Pain in left arm: Secondary | ICD-10-CM

## 2018-09-19 DIAGNOSIS — E785 Hyperlipidemia, unspecified: Secondary | ICD-10-CM | POA: Diagnosis not present

## 2018-09-19 LAB — COMPREHENSIVE METABOLIC PANEL
ALT: 17 U/L (ref 0–35)
AST: 13 U/L (ref 0–37)
Albumin: 4.1 g/dL (ref 3.5–5.2)
Alkaline Phosphatase: 80 U/L (ref 39–117)
BUN: 19 mg/dL (ref 6–23)
CO2: 26 mEq/L (ref 19–32)
Calcium: 9.7 mg/dL (ref 8.4–10.5)
Chloride: 102 mEq/L (ref 96–112)
Creatinine, Ser: 0.72 mg/dL (ref 0.40–1.20)
GFR: 86.26 mL/min (ref >60.00)
Glucose, Bld: 185 mg/dL — ABNORMAL HIGH (ref 70–99)
Potassium: 4.4 mEq/L (ref 3.5–5.1)
Sodium: 137 mEq/L (ref 135–145)
Total Bilirubin: 0.3 mg/dL (ref 0.2–1.2)
Total Protein: 6.9 g/dL (ref 6.0–8.3)

## 2018-09-19 LAB — CBC WITH DIFFERENTIAL/PLATELET
Basophils Absolute: 0 10*3/uL (ref 0.0–0.1)
Basophils Relative: 0.8 % (ref 0.0–3.0)
Eosinophils Absolute: 0.2 10*3/uL (ref 0.0–0.7)
Eosinophils Relative: 3.9 % (ref 0.0–5.0)
HCT: 42.5 % (ref 36.0–46.0)
Hemoglobin: 13.9 g/dL (ref 12.0–15.0)
Lymphocytes Relative: 37.3 % (ref 12.0–46.0)
Lymphs Abs: 2 10*3/uL (ref 0.7–4.0)
MCHC: 32.6 g/dL (ref 30.0–36.0)
MCV: 87.6 fl (ref 78.0–100.0)
Monocytes Absolute: 0.2 10*3/uL (ref 0.1–1.0)
Monocytes Relative: 4.7 % (ref 3.0–12.0)
Neutro Abs: 2.8 10*3/uL (ref 1.4–7.7)
Neutrophils Relative %: 53.3 % (ref 43.0–77.0)
Platelets: 247 10*3/uL (ref 150.0–400.0)
RBC: 4.86 Mil/uL (ref 3.87–5.11)
RDW: 15.2 % (ref 11.5–15.5)
WBC: 5.3 10*3/uL (ref 4.0–10.5)

## 2018-09-19 LAB — LIPID PANEL
Cholesterol: 157 mg/dL (ref 0–200)
HDL: 47.1 mg/dL (ref >39.00)
NonHDL: 110.22
Total CHOL/HDL Ratio: 3
Triglycerides: 318 mg/dL — ABNORMAL HIGH (ref 0.0–149.0)
VLDL: 63.6 mg/dL — ABNORMAL HIGH (ref 0.0–40.0)

## 2018-09-19 LAB — LDL CHOLESTEROL, DIRECT: Direct LDL: 80 mg/dL

## 2018-09-19 LAB — HEMOGLOBIN A1C: Hgb A1c MFr Bld: 7.8 % — ABNORMAL HIGH (ref 4.6–6.5)

## 2018-09-19 LAB — VITAMIN D 25 HYDROXY (VIT D DEFICIENCY, FRACTURES): VITD: 32.97 ng/mL (ref 30.00–100.00)

## 2018-09-20 ENCOUNTER — Other Ambulatory Visit: Payer: Self-pay | Admitting: Internal Medicine

## 2018-09-20 DIAGNOSIS — E785 Hyperlipidemia, unspecified: Secondary | ICD-10-CM

## 2018-09-20 MED ORDER — ATORVASTATIN CALCIUM 40 MG PO TABS: 40.0000 mg | ORAL_TABLET | Freq: Every day | ORAL | 3 refills | Status: DC

## 2018-09-27 DIAGNOSIS — R110 Nausea: Secondary | ICD-10-CM | POA: Diagnosis not present

## 2018-09-27 DIAGNOSIS — G894 Chronic pain syndrome: Secondary | ICD-10-CM | POA: Diagnosis not present

## 2018-09-27 DIAGNOSIS — G890 Central pain syndrome: Secondary | ICD-10-CM | POA: Diagnosis not present

## 2018-09-27 DIAGNOSIS — Z79891 Long term (current) use of opiate analgesic: Secondary | ICD-10-CM | POA: Diagnosis not present

## 2018-10-01 ENCOUNTER — Ambulatory Visit (INDEPENDENT_AMBULATORY_CARE_PROVIDER_SITE_OTHER): Payer: Medicare Other | Admitting: Psychology

## 2018-10-01 ENCOUNTER — Ambulatory Visit: Payer: Medicare Other | Admitting: Psychology

## 2018-10-01 DIAGNOSIS — F339 Major depressive disorder, recurrent, unspecified: Secondary | ICD-10-CM | POA: Diagnosis not present

## 2018-10-01 DIAGNOSIS — F411 Generalized anxiety disorder: Secondary | ICD-10-CM

## 2018-10-02 ENCOUNTER — Ambulatory Visit: Payer: Medicare Other | Admitting: Internal Medicine

## 2018-10-02 ENCOUNTER — Encounter: Payer: Self-pay | Admitting: Internal Medicine

## 2018-10-02 ENCOUNTER — Ambulatory Visit: Payer: 59 | Admitting: Internal Medicine

## 2018-10-02 VITALS — BP 120/82 | HR 96 | Ht 65.0 in | Wt 240.0 lb

## 2018-10-02 DIAGNOSIS — E785 Hyperlipidemia, unspecified: Secondary | ICD-10-CM | POA: Diagnosis not present

## 2018-10-02 DIAGNOSIS — Z6836 Body mass index (BMI) 36.0-36.9, adult: Secondary | ICD-10-CM

## 2018-10-02 DIAGNOSIS — E1165 Type 2 diabetes mellitus with hyperglycemia: Secondary | ICD-10-CM | POA: Diagnosis not present

## 2018-10-02 NOTE — Patient Instructions (Addendum)
Please continue: - Metformin 1000 mg 2x a day, with meals - Jardiance 25 mg daily in a.m. - Trulicity 1.5 mg weekly - Glipizide 5 mg 30 minutes before dinner  STOP SNACKING, ESPECIALLY AT NIGHT.  Please return in 3 months with your sugar log.
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
## 2018-10-04 ENCOUNTER — Telehealth: Payer: Self-pay | Admitting: *Deleted

## 2018-10-04 NOTE — Telephone Encounter (Signed)
Called patient to answer her questions.  Just wanted to hear from somebody that she is never seen the patient come off of Eliquis for 2 days and have some clot or death.  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  I told her that she should be fine however I cannot 100% guarantee that something could happen to her because there is a instance of a rare event.  She states that she tells her patients all the time about coming off of the blood thinners is perfectly fine but she is just very nervous about her doing it herself.  She said she would be fine coming over however her appointment was in the afternoon.  That would make her be off of her Eliquis for 2 days and then a half a day because she takes it twice a day.  So we have moved her appointment up to the morning at 845 she will bring her medicine with her and take it as soon as she gets her blood work drawn that morning and therefore she only missed 2 days and not more than 2 days.  Patient is agreeable to coming in and being off the Eliquis and if she has any future questions she is welcome to call back.  Patient says that she actually will be fine she just wanted to talk to somebody so that she could hear somebody else say that she is gone to be fine and not have any problems.  I did tell her not to engage in any activities that were physical that she could get her in.  She says she is well aware of that she just needed to talk to the person so that she can get her mind in the right point to come over and she will be okay.

## 2018-10-04 NOTE — Telephone Encounter (Signed)
Patient requesting a return call as she has questions regarding stopping her Eliquis 2 days prior to labs to be drawn

## 2018-10-10 ENCOUNTER — Ambulatory Visit (INDEPENDENT_AMBULATORY_CARE_PROVIDER_SITE_OTHER): Payer: Medicare Other | Admitting: Psychology

## 2018-10-10 DIAGNOSIS — F411 Generalized anxiety disorder: Secondary | ICD-10-CM | POA: Diagnosis not present

## 2018-10-10 DIAGNOSIS — F339 Major depressive disorder, recurrent, unspecified: Secondary | ICD-10-CM

## 2018-10-11 ENCOUNTER — Inpatient Hospital Stay: Payer: Medicare Other | Attending: Oncology

## 2018-10-11 DIAGNOSIS — G894 Chronic pain syndrome: Secondary | ICD-10-CM | POA: Diagnosis not present

## 2018-10-11 DIAGNOSIS — E119 Type 2 diabetes mellitus without complications: Secondary | ICD-10-CM | POA: Diagnosis not present

## 2018-10-11 DIAGNOSIS — Z7984 Long term (current) use of oral hypoglycemic drugs: Secondary | ICD-10-CM | POA: Insufficient documentation

## 2018-10-11 DIAGNOSIS — Z7901 Long term (current) use of anticoagulants: Secondary | ICD-10-CM | POA: Diagnosis not present

## 2018-10-11 DIAGNOSIS — E785 Hyperlipidemia, unspecified: Secondary | ICD-10-CM | POA: Diagnosis not present

## 2018-10-11 DIAGNOSIS — I2609 Other pulmonary embolism with acute cor pulmonale: Secondary | ICD-10-CM | POA: Insufficient documentation

## 2018-10-11 DIAGNOSIS — D6862 Lupus anticoagulant syndrome: Secondary | ICD-10-CM | POA: Diagnosis not present

## 2018-10-11 DIAGNOSIS — Z79899 Other long term (current) drug therapy: Secondary | ICD-10-CM | POA: Diagnosis not present

## 2018-10-11 DIAGNOSIS — Z87891 Personal history of nicotine dependence: Secondary | ICD-10-CM | POA: Insufficient documentation

## 2018-10-11 DIAGNOSIS — E669 Obesity, unspecified: Secondary | ICD-10-CM | POA: Diagnosis not present

## 2018-10-11 DIAGNOSIS — I2782 Chronic pulmonary embolism: Secondary | ICD-10-CM

## 2018-10-11 LAB — ANTITHROMBIN III: AntiThromb III Func: 108 % (ref 75–120)

## 2018-10-12 LAB — HEXAGONAL PHASE PHOSPHOLIPID: Hex Phosph Neut Test: 0 s (ref 0–11)

## 2018-10-12 LAB — HEX PHASE PHOSPHOLIPID REFLEX

## 2018-10-13 LAB — BETA-2-GLYCOPROTEIN I ABS, IGG/M/A
Beta-2 Glyco I IgG: >150 GPI IgG units — ABNORMAL HIGH (ref 0–20)
Beta-2-Glycoprotein I IgA: <9 GPI IgA units (ref 0–25)
Beta-2-Glycoprotein I IgM: <9 GPI IgM units (ref 0–32)

## 2018-10-16 ENCOUNTER — Other Ambulatory Visit: Payer: Self-pay

## 2018-10-16 ENCOUNTER — Inpatient Hospital Stay (HOSPITAL_BASED_OUTPATIENT_CLINIC_OR_DEPARTMENT_OTHER): Payer: Medicare Other | Admitting: Oncology

## 2018-10-16 VITALS — BP 86/59 | HR 87 | Temp 96.8°F | Resp 18 | Wt 243.0 lb

## 2018-10-16 DIAGNOSIS — Z87891 Personal history of nicotine dependence: Secondary | ICD-10-CM | POA: Diagnosis not present

## 2018-10-16 DIAGNOSIS — Z7984 Long term (current) use of oral hypoglycemic drugs: Secondary | ICD-10-CM

## 2018-10-16 DIAGNOSIS — Z79899 Other long term (current) drug therapy: Secondary | ICD-10-CM

## 2018-10-16 DIAGNOSIS — D6862 Lupus anticoagulant syndrome: Secondary | ICD-10-CM | POA: Diagnosis not present

## 2018-10-16 DIAGNOSIS — E785 Hyperlipidemia, unspecified: Secondary | ICD-10-CM

## 2018-10-16 DIAGNOSIS — I2782 Chronic pulmonary embolism: Principal | ICD-10-CM

## 2018-10-16 DIAGNOSIS — I2609 Other pulmonary embolism with acute cor pulmonale: Secondary | ICD-10-CM

## 2018-10-16 DIAGNOSIS — E119 Type 2 diabetes mellitus without complications: Secondary | ICD-10-CM

## 2018-10-16 DIAGNOSIS — Z7901 Long term (current) use of anticoagulants: Secondary | ICD-10-CM | POA: Diagnosis not present

## 2018-10-16 DIAGNOSIS — G894 Chronic pain syndrome: Secondary | ICD-10-CM | POA: Diagnosis not present

## 2018-10-16 DIAGNOSIS — E669 Obesity, unspecified: Secondary | ICD-10-CM

## 2018-10-16 NOTE — Progress Notes (Signed)
Here for follow up. See vs- orthostatic changes today  BP 86/59 p 87  , standing  95/61 p120 .. given g ale    Taking enough fluids per pt(not this am ) she stated   In NAD   Dr Janese Banks informed   Rpt bp 100/67  p 83 -pt stated she will continue drinking fluids

## 2018-10-17 ENCOUNTER — Ambulatory Visit (INDEPENDENT_AMBULATORY_CARE_PROVIDER_SITE_OTHER): Payer: Medicare Other | Admitting: Psychology

## 2018-10-17 DIAGNOSIS — F411 Generalized anxiety disorder: Secondary | ICD-10-CM

## 2018-10-17 DIAGNOSIS — F339 Major depressive disorder, recurrent, unspecified: Secondary | ICD-10-CM | POA: Diagnosis not present

## 2018-10-18 ENCOUNTER — Ambulatory Visit: Payer: Medicare Other | Admitting: Oncology

## 2018-10-19 ENCOUNTER — Encounter: Payer: Self-pay | Admitting: Oncology
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
## 2018-10-22 ENCOUNTER — Other Ambulatory Visit: Payer: Self-pay | Admitting: Neurology

## 2018-10-22 ENCOUNTER — Other Ambulatory Visit: Payer: Self-pay | Admitting: Family Medicine

## 2018-10-22 DIAGNOSIS — G43711 Chronic migraine without aura, intractable, with status migrainosus: Secondary | ICD-10-CM

## 2018-10-25 ENCOUNTER — Other Ambulatory Visit: Payer: Self-pay | Admitting: Internal Medicine

## 2018-10-25 DIAGNOSIS — Z79891 Long term (current) use of opiate analgesic: Secondary | ICD-10-CM | POA: Diagnosis not present

## 2018-10-25 DIAGNOSIS — F32A Depression, unspecified: Secondary | ICD-10-CM

## 2018-10-25 DIAGNOSIS — G890 Central pain syndrome: Secondary | ICD-10-CM | POA: Diagnosis not present

## 2018-10-25 DIAGNOSIS — G894 Chronic pain syndrome: Secondary | ICD-10-CM | POA: Diagnosis not present

## 2018-10-25 DIAGNOSIS — F329 Major depressive disorder, single episode, unspecified: Secondary | ICD-10-CM

## 2018-10-25 DIAGNOSIS — F419 Anxiety disorder, unspecified: Secondary | ICD-10-CM

## 2018-10-25 DIAGNOSIS — R110 Nausea: Secondary | ICD-10-CM | POA: Diagnosis not present

## 2018-10-25 MED ORDER — ALPRAZOLAM 0.5 MG PO TABS: 0.5000 mg | ORAL_TABLET | Freq: Every day | ORAL | 5 refills | Status: DC | PRN

## 2018-10-25 MED ORDER — BUPROPION HCL ER (XL) 300 MG PO TB24: ORAL_TABLET | ORAL | 12 refills | Status: DC

## 2018-10-26 ENCOUNTER — Ambulatory Visit (INDEPENDENT_AMBULATORY_CARE_PROVIDER_SITE_OTHER): Payer: Medicare Other | Admitting: Internal Medicine

## 2018-10-26 ENCOUNTER — Encounter: Payer: Self-pay | Admitting: Internal Medicine

## 2018-10-26 VITALS — BP 106/70 | HR 78 | Temp 98.3°F | Ht 65.0 in | Wt 240.8 lb

## 2018-10-26 DIAGNOSIS — E1165 Type 2 diabetes mellitus with hyperglycemia: Secondary | ICD-10-CM

## 2018-10-26 DIAGNOSIS — F329 Major depressive disorder, single episode, unspecified: Secondary | ICD-10-CM

## 2018-10-26 DIAGNOSIS — Z23 Encounter for immunization: Secondary | ICD-10-CM

## 2018-10-26 DIAGNOSIS — F419 Anxiety disorder, unspecified: Secondary | ICD-10-CM

## 2018-10-26 DIAGNOSIS — E785 Hyperlipidemia, unspecified: Secondary | ICD-10-CM

## 2018-10-26 DIAGNOSIS — F32A Depression, unspecified: Secondary | ICD-10-CM

## 2018-10-26 NOTE — Patient Instructions (Addendum)
There is home monitoring coumadin machine   Consider MMR vaccine at pharmacy  New hepatitis B vaccine here x 2 doses   mammo due march 25th   Results for TERRIA, DESCHEPPER (MRN [id number]) as of 10/26/2018 11:22  Ref. Range 09/19/2018 11:39  Total CHOL/HDL Ratio Unknown 3  Cholesterol Latest Ref Range: 0 - 200 mg/dL 157  HDL Cholesterol Latest Ref Range: >39.00 mg/dL 47.10  Direct LDL Latest Units: mg/dL 80.0  NonHDL Unknown 110.22  Triglycerides Latest Ref Range: 0.0 - 149.0 mg/dL 318.0 (H)  VLDL Latest Ref Range: 0.0 - 40.0 mg/dL 63.6 (H)     Cholesterol Cholesterol is a white, waxy, fat-like substance that is needed by the human body in small amounts. The liver makes all the cholesterol we need. Cholesterol is carried from the liver by the blood through the blood vessels. Deposits of cholesterol (plaques) may build up on blood vessel (artery) walls. Plaques make the arteries narrower and stiffer. Cholesterol plaques increase the risk for heart attack and stroke. You cannot feel your cholesterol level even if it is very high. The only way to know that it is high is to have a blood test. Once you know your cholesterol levels, you should keep a record of the test results. Work with your health care provider to keep your levels in the desired range. What do the results mean?  Total cholesterol is a rough measure of all the cholesterol in your blood.  LDL (low-density lipoprotein) is the "bad" cholesterol. This is the type that causes plaque to build up on the artery walls. You want this level to be low.  HDL (high-density lipoprotein) is the "good" cholesterol because it cleans the arteries and carries the LDL away. You want this level to be high.  Triglycerides are fat that the body can either burn for energy or store. High levels are closely linked to heart disease. What are the desired levels of cholesterol?  Total cholesterol below 200.  LDL below 100 for people who are at risk, below  70 for people at very high risk.  HDL above 40 is good. A level of 60 or higher is considered to be protective against heart disease.  Triglycerides below 150. How can I lower my cholesterol? Diet Follow your diet program as told by your health care provider.  Choose fish or white meat chicken and [place], roasted or baked. Limit fatty cuts of red meat, fried foods, and processed meats, such as sausage and lunch meats.  Eat lots of fresh fruits and vegetables.  Choose whole grains, beans, pasta, potatoes, and cereals.  Choose olive oil, corn oil, or canola oil, and use only small amounts.  Avoid butter, mayonnaise, shortening, or palm kernel oils.  Avoid foods with trans fats.  Drink skim or nonfat milk and eat low-fat or nonfat yogurt and cheeses. Avoid whole milk, cream, ice cream, egg yolks, and full-fat cheeses.  Healthier desserts include angel food cake, ginger snaps, animal crackers, hard candy, popsicles, and low-fat or nonfat frozen yogurt. Avoid pastries, cakes, pies, and cookies.  Exercise  Follow your exercise program as told by your health care provider. A regular program: ? Helps to decrease LDL and raise HDL. ? Helps with weight control.  Do things that increase your activity level, such as gardening, walking, and taking the stairs.  Ask your health care provider about ways that you can be more active in your daily life. Medicine  Take over-the-counter and prescription medicines only as told  by your health care provider. ? Medicine may be prescribed by your health care provider to help lower cholesterol and decrease the risk for heart disease. This is usually done if diet and exercise have failed to bring down cholesterol levels. ? If you have several risk factors, you may need medicine even if your levels are normal. This information is not intended to replace advice given to you by your health care provider. Make sure you discuss any questions you have with your  health care provider. Document Released: 05/03/2001 Document Revised: 03/05/2016 Document Reviewed: 02/06/2016 Elsevier Interactive Patient Education  [employer].
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
## 2018-10-26 NOTE — Progress Notes (Signed)
Pre visit review using our clinic review tool, if applicable. No additional management support is needed unless otherwise documented below in the visit note. 

## 2018-11-07 ENCOUNTER — Ambulatory Visit (INDEPENDENT_AMBULATORY_CARE_PROVIDER_SITE_OTHER): Payer: Medicare Other | Admitting: Psychology

## 2018-11-07 DIAGNOSIS — F411 Generalized anxiety disorder: Secondary | ICD-10-CM | POA: Diagnosis not present

## 2018-11-07 DIAGNOSIS — F339 Major depressive disorder, recurrent, unspecified: Secondary | ICD-10-CM

## 2018-11-14 ENCOUNTER — Ambulatory Visit (INDEPENDENT_AMBULATORY_CARE_PROVIDER_SITE_OTHER): Payer: Medicare Other | Admitting: Psychology

## 2018-11-14 DIAGNOSIS — F411 Generalized anxiety disorder: Secondary | ICD-10-CM | POA: Diagnosis not present

## 2018-11-14 DIAGNOSIS — F339 Major depressive disorder, recurrent, unspecified: Secondary | ICD-10-CM

## 2018-11-23 ENCOUNTER — Ambulatory Visit: Payer: Medicare Other | Admitting: Psychology

## 2018-11-26 DIAGNOSIS — G894 Chronic pain syndrome: Secondary | ICD-10-CM | POA: Diagnosis not present

## 2018-11-26 DIAGNOSIS — R110 Nausea: Secondary | ICD-10-CM | POA: Diagnosis not present

## 2018-11-26 DIAGNOSIS — G890 Central pain syndrome: Secondary | ICD-10-CM | POA: Diagnosis not present

## 2018-11-26 DIAGNOSIS — Z79891 Long term (current) use of opiate analgesic: Secondary | ICD-10-CM | POA: Diagnosis not present

## 2018-11-28 ENCOUNTER — Ambulatory Visit (INDEPENDENT_AMBULATORY_CARE_PROVIDER_SITE_OTHER): Payer: Medicare Other | Admitting: Psychology

## 2018-11-28 DIAGNOSIS — F339 Major depressive disorder, recurrent, unspecified: Secondary | ICD-10-CM | POA: Diagnosis not present

## 2018-11-28 DIAGNOSIS — F411 Generalized anxiety disorder: Secondary | ICD-10-CM | POA: Diagnosis not present

## 2018-11-30 ENCOUNTER — Telehealth: Payer: Self-pay | Admitting: Internal Medicine

## 2018-11-30 NOTE — Telephone Encounter (Signed)
Copied from [place] [id number]. Topic: Quick Communication - See Telephone Encounter >> Nov 30, 2018 12:29 PM Rutherford Nail, NT wrote: CRM for notification. See Telephone encounter for: 11/30/18. Cory with [employer] and states that there is a Benzo-opioid interaction with the ALPRAZolam (XANAX) 0.5 MG tablet prescription that as sent in. Would like to know if the provider is okay with still prescribing this medication? Please advise. CB#: [phone]

## 2018-12-03 NOTE — Telephone Encounter (Signed)
Left message for patient to return call back. PEC may give results and obtain information.  

## 2018-12-03 NOTE — Telephone Encounter (Signed)
Speak with Amanda Vasquez I prescribe Xanax  I am unsure who is Rx pain medication did not know she was on any currently   Call and ask the pt who is Rx pain medication and update medication list   [place]

## 2018-12-03 NOTE — Telephone Encounter (Signed)
Patient returned call to office and sates that she has been receiving pain medications from Dr. Nicholaus Bloom, a  pain specialist. Patient states that she is currently taking Morhpine Sulphate extended release, Oxycodone  5 mg prn and Gralise 600 mg.

## 2018-12-03 NOTE — Telephone Encounter (Signed)
Copied from [place] [id number]. Topic: Quick Communication - See Telephone Encounter >> Nov 29, 2018  5:38 PM Loma Boston wrote: CRM for notification. See Telephone encounter for: 11/29/18. ALPRAZolam (XANAX) 0.5 MG tablet  [facility] to [place] and the Pharmacy needs to document from the prescribing dr  that they are aware that this will be taken with an opioid. Please correspond to [facility] .[address]   Fax [phone] and CB [phone] Please ask for Vicente Males

## 2018-12-04 ENCOUNTER — Encounter: Payer: Self-pay | Admitting: Internal Medicine

## 2018-12-04 NOTE — Telephone Encounter (Signed)
Call pharmacy speak Amanda Vasquez   Would like to know if the provider is okay with still prescribing this medication? Please advise. CB#: [phone]  pt established with pain clinic Dr. Hardin Negus she has been on morphine, oxycodone, gralise chronically and tolerating xanax thank you for making [place] aware. I am ok with refill she she has been on these meds    [place]

## 2018-12-04 NOTE — Telephone Encounter (Signed)
Amanda Vasquez has been notified.

## 2018-12-17 ENCOUNTER — Telehealth: Payer: Self-pay | Admitting: Neurology

## 2018-12-17 NOTE — Telephone Encounter (Signed)
I called the patient and left her a VM asking her to call me back. DW

## 2018-12-17 NOTE — Telephone Encounter (Signed)
Pt is asking for a call to discuss her next Botox appointment

## 2018-12-18 ENCOUNTER — Other Ambulatory Visit: Payer: Self-pay

## 2018-12-19 NOTE — Telephone Encounter (Signed)
I called the patient back but she did not answer so I left a VM asking her to call me back. DW

## 2018-12-19 NOTE — Telephone Encounter (Signed)
Amanda Vasquez patient trying to call you back . [place].

## 2018-12-19 NOTE — Telephone Encounter (Signed)
The patient called back she is taking care of her parents in [place] and really needed an apt next week. We scheduled her and I confirmed that she has not shown any new symptoms, been exposed to the virus nor been running a fever. I also informed her she would need to wear a mask and gloves.

## 2018-12-20 ENCOUNTER — Ambulatory Visit (INDEPENDENT_AMBULATORY_CARE_PROVIDER_SITE_OTHER): Payer: Medicare Other | Admitting: Psychology

## 2018-12-20 DIAGNOSIS — F411 Generalized anxiety disorder: Secondary | ICD-10-CM

## 2018-12-20 DIAGNOSIS — F339 Major depressive disorder, recurrent, unspecified: Secondary | ICD-10-CM

## 2018-12-24 ENCOUNTER — Telehealth: Payer: Self-pay | Admitting: Neurology

## 2018-12-24 DIAGNOSIS — G890 Central pain syndrome: Secondary | ICD-10-CM | POA: Diagnosis not present

## 2018-12-24 DIAGNOSIS — R110 Nausea: Secondary | ICD-10-CM | POA: Diagnosis not present

## 2018-12-24 DIAGNOSIS — Z79891 Long term (current) use of opiate analgesic: Secondary | ICD-10-CM | POA: Diagnosis not present

## 2018-12-24 DIAGNOSIS — G894 Chronic pain syndrome: Secondary | ICD-10-CM | POA: Diagnosis not present

## 2018-12-24 NOTE — Telephone Encounter (Signed)
I called and spoke with the patients insurance regarding Botox injections. [id number] and [phone] did not need pre certification I spoke with Lexine Baton B. RNH#[id number].

## 2018-12-25 ENCOUNTER — Other Ambulatory Visit: Payer: Self-pay

## 2018-12-25 ENCOUNTER — Ambulatory Visit: Payer: Medicare Other | Admitting: Neurology

## 2018-12-25 VITALS — Temp 99.1°F

## 2018-12-25 DIAGNOSIS — G43711 Chronic migraine without aura, intractable, with status migrainosus: Secondary | ICD-10-CM | POA: Diagnosis not present

## 2018-12-25 NOTE — Progress Notes (Signed)
Consent Form Botulism Toxin Injection For Chronic Migraine   Interval history: Continues to do extremely well > 75% decrease freq headaches. no temples, not masseters, not eyes, not levators   Reviewed orally with patient, additionally signature is on file:  Botulism toxin has been approved by the Federal drug administration for treatment of chronic migraine. Botulism toxin does not cure chronic migraine and it may not be effective in some patients.  The administration of botulism toxin is accomplished by injecting a small amount of toxin into the muscles of the neck and head. Dosage must be titrated for each individual. Any benefits resulting from botulism toxin tend to wear off after 3 months with a repeat injection required if benefit is to be maintained. Injections are usually done every 3-4 months with maximum effect peak achieved by about 2 or 3 weeks. Botulism toxin is expensive and you should be sure of what costs you will incur resulting from the injection.  The side effects of botulism toxin use for chronic migraine may include:   -Transient, and usually mild, facial weakness with facial injections  -Transient, and usually mild, head or neck weakness with head/neck injections  -Reduction or loss of forehead facial animation due to forehead muscle weakness  -Eyelid drooping  -Dry eye  -Pain at the site of injection or bruising at the site of injection  -Double vision  -Potential unknown long term risks  Contraindications: You should not have Botox if you are pregnant, nursing, allergic to albumin, have an infection, skin condition, or muscle weakness at the site of the injection, or have myasthenia gravis, Lambert-Eaton syndrome, or ALS.  It is also possible that as with any injection, there may be an allergic reaction or no effect from the medication. Reduced effectiveness after repeated injections is sometimes seen and rarely infection at the injection site may occur. All care  will be taken to prevent these side effects. If therapy is given over a long time, atrophy and wasting in the muscle injected may occur. Occasionally the patient's become refractory to treatment because they develop antibodies to the toxin. In this event, therapy needs to be modified.  I have read the above information and consent to the administration of botulism toxin.    BOTOX PROCEDURE NOTE FOR MIGRAINE HEADACHE    Contraindications and precautions discussed with patient(above). Aseptic procedure was observed and patient tolerated procedure. Procedure performed by Dr. Georgia Dom  The condition has existed for more than 6 months, and pt does not have a diagnosis of ALS, Myasthenia Gravis or Lambert-Eaton Syndrome.  Risks and benefits of injections discussed and pt agrees to proceed with the procedure.  Written consent obtained  These injections are medically necessary. Pt  receives good benefits from these injections. These injections do not cause sedations or hallucinations which the oral therapies may cause.  Description of procedure:  The patient was placed in a sitting position. The standard protocol was used for Botox as follows, with 5 units of Botox injected at each site:   -Procerus muscle, midline injection  -Corrugator muscle, bilateral injection  -Frontalis muscle, bilateral injection, with 2 sites each side, medial injection was performed in the upper one third of the frontalis muscle, in the region vertical from the medial inferior edge of the superior orbital rim. The lateral injection was again in the upper one third of the forehead vertically above the lateral limbus of the cornea, 1.5 cm lateral to the medial injection site.    -Temporalis muscle  injection, 5 sites, bilaterally. The first injection was 3 cm above the tragus of the ear, second injection site was 1.5 cm to 3 cm up from the first injection site in line with the tragus of the ear. The third injection site  was 1.5-3 cm forward between the first 2 injection sites. The fourth injection site was 1.5 cm posterior to the second injection site.   -Occipitalis muscle injection, 3 sites, bilaterally. The first injection was done one half way between the occipital protuberance and the tip of the mastoid process behind the ear. The second injection site was done lateral and superior to the first, 1 fingerbreadth from the first injection. The third injection site was 1 fingerbreadth superiorly and medially from the first injection site.  -Cervical paraspinal muscle injection, 2 sites, bilateral knee first injection site was 1 cm from the midline of the cervical spine, 3 cm inferior to the lower border of the occipital protuberance. The second injection site was 1.5 cm superiorly and laterally to the first injection site.  -Trapezius muscle injection was performed at 3 sites, bilaterally. The first injection site was in the upper trapezius muscle halfway between the inflection point of the neck, and the acromion. The second injection site was one half way between the acromion and the first injection site. The third injection was done between the first injection site and the inflection point of the neck.   Will return for repeat injection in 3 months.   A 200 unit sof Botox was used, any Botox not injected was wasted. The patient tolerated the procedure well, there were no complications of the above procedure.  Performed by Dr. Jaynee Eagles M.D. 62ml Lidocaine 1%, 30-gauge needle was used. All procedures a documented were medically necessary, reasonable and appropriate based on the patient's history, medical diagnosis and physician opinion. Verbal informed consent was obtained from the patient, patient was informed of potential risk of procedure, including bruising, bleeding, hematoma formation, infection, muscle weakness, muscle pain, numbness, transient hypertension, transient hyperglycemia and transient insomnia among  others. All areas injected were topically clean with isopropyl rubbing alcohol. Nonsterile nonlatex gloves were worn during the procedure.  Left pectoralis 3 injection sites based on palpation and pain. Used emg machine to target the muscle. Patient tolerated well no side effects. Felt better after procedure. Called her this evening, no side effects.

## 2018-12-25 NOTE — Progress Notes (Signed)
Botox- 100 units x 2 vials Lot: [id number] Expiration: 03/2021 NDC: 0699-9672-27  Bacteriostatic 0.9% Sodium Chloride- 91mL total Lot: [id number] Expiration: 05/23/2019 NDC: 1071-2524-79  Dx: P80.012 B/B

## 2018-12-28 ENCOUNTER — Other Ambulatory Visit: Payer: Self-pay

## 2019-01-01 ENCOUNTER — Ambulatory Visit (INDEPENDENT_AMBULATORY_CARE_PROVIDER_SITE_OTHER): Payer: Medicare Other | Admitting: Psychology

## 2019-01-01 DIAGNOSIS — F411 Generalized anxiety disorder: Secondary | ICD-10-CM | POA: Diagnosis not present

## 2019-01-01 DIAGNOSIS — F339 Major depressive disorder, recurrent, unspecified: Secondary | ICD-10-CM | POA: Diagnosis not present

## 2019-01-04 ENCOUNTER — Ambulatory Visit: Payer: Self-pay | Admitting: Oncology

## 2019-01-16 ENCOUNTER — Ambulatory Visit: Payer: Medicare Other | Admitting: Psychology

## 2019-01-17 ENCOUNTER — Ambulatory Visit: Payer: Self-pay | Admitting: Internal Medicine

## 2019-01-18 ENCOUNTER — Ambulatory Visit (INDEPENDENT_AMBULATORY_CARE_PROVIDER_SITE_OTHER): Payer: Medicare Other | Admitting: Psychology

## 2019-01-18 DIAGNOSIS — F411 Generalized anxiety disorder: Secondary | ICD-10-CM | POA: Diagnosis not present

## 2019-01-18 DIAGNOSIS — F339 Major depressive disorder, recurrent, unspecified: Secondary | ICD-10-CM | POA: Diagnosis not present

## 2019-01-21 DIAGNOSIS — G894 Chronic pain syndrome: Secondary | ICD-10-CM | POA: Diagnosis not present

## 2019-01-21 DIAGNOSIS — R110 Nausea: Secondary | ICD-10-CM | POA: Diagnosis not present

## 2019-01-21 DIAGNOSIS — G890 Central pain syndrome: Secondary | ICD-10-CM | POA: Diagnosis not present

## 2019-01-21 DIAGNOSIS — Z79891 Long term (current) use of opiate analgesic: Secondary | ICD-10-CM | POA: Diagnosis not present

## 2019-01-22 ENCOUNTER — Other Ambulatory Visit: Payer: Self-pay | Admitting: Internal Medicine

## 2019-01-22 DIAGNOSIS — K3184 Gastroparesis: Secondary | ICD-10-CM

## 2019-01-22 MED ORDER — METOCLOPRAMIDE HCL 5 MG PO TABS: ORAL_TABLET | ORAL | 2 refills | Status: DC

## 2019-01-22 MED ORDER — METOCLOPRAMIDE HCL 5 MG PO TABS: ORAL_TABLET | ORAL | 11 refills | Status: DC

## 2019-02-01 ENCOUNTER — Ambulatory Visit: Payer: Self-pay | Admitting: Psychology

## 2019-02-11 ENCOUNTER — Telehealth: Payer: Self-pay | Admitting: Neurology

## 2019-02-11 NOTE — Telephone Encounter (Signed)
I called to schedule the patient for her Botox injection but she did not answer so I left a VM asking her to call us back. DW

## 2019-02-13 ENCOUNTER — Other Ambulatory Visit: Payer: Self-pay | Admitting: Neurology

## 2019-02-13 DIAGNOSIS — G43711 Chronic migraine without aura, intractable, with status migrainosus: Secondary | ICD-10-CM

## 2019-02-15 ENCOUNTER — Ambulatory Visit (INDEPENDENT_AMBULATORY_CARE_PROVIDER_SITE_OTHER): Payer: Medicare Other | Admitting: Psychology

## 2019-02-15 DIAGNOSIS — F339 Major depressive disorder, recurrent, unspecified: Secondary | ICD-10-CM | POA: Diagnosis not present

## 2019-02-15 DIAGNOSIS — F411 Generalized anxiety disorder: Secondary | ICD-10-CM

## 2019-02-18 ENCOUNTER — Encounter: Payer: Self-pay | Admitting: Internal Medicine

## 2019-02-18 ENCOUNTER — Other Ambulatory Visit: Payer: Self-pay

## 2019-02-18 ENCOUNTER — Ambulatory Visit: Payer: Medicare Other | Admitting: Internal Medicine

## 2019-02-18 VITALS — BP 118/60 | HR 95 | Ht 65.0 in | Wt 229.0 lb

## 2019-02-18 DIAGNOSIS — E1165 Type 2 diabetes mellitus with hyperglycemia: Secondary | ICD-10-CM

## 2019-02-18 DIAGNOSIS — G894 Chronic pain syndrome: Secondary | ICD-10-CM | POA: Diagnosis not present

## 2019-02-18 DIAGNOSIS — R110 Nausea: Secondary | ICD-10-CM | POA: Diagnosis not present

## 2019-02-18 DIAGNOSIS — Z79891 Long term (current) use of opiate analgesic: Secondary | ICD-10-CM | POA: Diagnosis not present

## 2019-02-18 DIAGNOSIS — G890 Central pain syndrome: Secondary | ICD-10-CM | POA: Diagnosis not present

## 2019-02-18 LAB — POCT GLYCOSYLATED HEMOGLOBIN (HGB A1C): Hemoglobin A1C: 6.7 % — AB (ref 4.0–5.6)

## 2019-02-18 NOTE — Patient Instructions (Addendum)
Please continue: - Metformin 1000 mg 2x a day, with meals - Jardiance 25 mg daily in a.m. - Trulicity 1.5 mg weekly  Stop: - Glipizide but if you have to restart: 5 mg 30 minutes before breakfast or dinner  Please return in 4 months with your sugar log.
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
## 2019-02-21 DIAGNOSIS — D485 Neoplasm of uncertain behavior of skin: Secondary | ICD-10-CM | POA: Diagnosis not present

## 2019-02-21 DIAGNOSIS — D229 Melanocytic nevi, unspecified: Secondary | ICD-10-CM | POA: Diagnosis not present

## 2019-02-21 DIAGNOSIS — L814 Other melanin hyperpigmentation: Secondary | ICD-10-CM | POA: Diagnosis not present

## 2019-02-21 DIAGNOSIS — Z1283 Encounter for screening for malignant neoplasm of skin: Secondary | ICD-10-CM | POA: Diagnosis not present

## 2019-02-21 DIAGNOSIS — D1800 Hemangioma unspecified site: Secondary | ICD-10-CM | POA: Diagnosis not present

## 2019-02-21 NOTE — Telephone Encounter (Signed)
Pt returning call please call back °

## 2019-02-21 NOTE — Telephone Encounter (Signed)
I called the patient again but she did not answer, I left a VM asking her to call me back.

## 2019-02-25 NOTE — Telephone Encounter (Signed)
I called and scheduled the patient. DW  °

## 2019-03-01 ENCOUNTER — Ambulatory Visit: Payer: Self-pay | Admitting: Psychology

## 2019-03-14 ENCOUNTER — Other Ambulatory Visit: Payer: Self-pay | Admitting: Internal Medicine

## 2019-03-14 DIAGNOSIS — F419 Anxiety disorder, unspecified: Secondary | ICD-10-CM

## 2019-03-14 DIAGNOSIS — F329 Major depressive disorder, single episode, unspecified: Secondary | ICD-10-CM

## 2019-03-14 DIAGNOSIS — F32A Depression, unspecified: Secondary | ICD-10-CM

## 2019-03-14 MED ORDER — ALPRAZOLAM 0.5 MG PO TABS: 0.5000 mg | ORAL_TABLET | Freq: Every day | ORAL | 0 refills | Status: DC | PRN

## 2019-03-18 DIAGNOSIS — R110 Nausea: Secondary | ICD-10-CM | POA: Diagnosis not present

## 2019-03-18 DIAGNOSIS — Z79891 Long term (current) use of opiate analgesic: Secondary | ICD-10-CM | POA: Diagnosis not present

## 2019-03-18 DIAGNOSIS — G890 Central pain syndrome: Secondary | ICD-10-CM | POA: Diagnosis not present

## 2019-03-18 DIAGNOSIS — G894 Chronic pain syndrome: Secondary | ICD-10-CM | POA: Diagnosis not present

## 2019-03-21 ENCOUNTER — Other Ambulatory Visit: Payer: Self-pay | Admitting: Neurology

## 2019-03-21 ENCOUNTER — Other Ambulatory Visit: Payer: Self-pay | Admitting: Internal Medicine

## 2019-03-21 ENCOUNTER — Other Ambulatory Visit: Payer: Self-pay | Admitting: *Deleted

## 2019-03-21 DIAGNOSIS — I2782 Chronic pulmonary embolism: Secondary | ICD-10-CM

## 2019-03-21 DIAGNOSIS — E1165 Type 2 diabetes mellitus with hyperglycemia: Secondary | ICD-10-CM

## 2019-03-21 MED ORDER — METFORMIN HCL 1000 MG PO TABS: 1000.0000 mg | ORAL_TABLET | Freq: Two times a day (BID) | ORAL | 3 refills | Status: DC

## 2019-03-21 NOTE — Telephone Encounter (Signed)
I called patient and told her that her PCP had refilled Eliquis for a year on 02/16/19 She will call [facility] and discuss with them

## 2019-03-22 ENCOUNTER — Telehealth: Payer: Self-pay | Admitting: *Deleted

## 2019-03-22 ENCOUNTER — Telehealth: Payer: Self-pay | Admitting: Internal Medicine

## 2019-03-22 ENCOUNTER — Other Ambulatory Visit: Payer: Self-pay | Admitting: Internal Medicine

## 2019-03-22 ENCOUNTER — Encounter: Payer: Self-pay | Admitting: Internal Medicine

## 2019-03-22 DIAGNOSIS — D6861 Antiphospholipid syndrome: Secondary | ICD-10-CM | POA: Insufficient documentation

## 2019-03-22 DIAGNOSIS — I2782 Chronic pulmonary embolism: Secondary | ICD-10-CM

## 2019-03-22 MED ORDER — APIXABAN 5 MG PO TABS: 5.0000 mg | ORAL_TABLET | Freq: Two times a day (BID) | ORAL | 0 refills | Status: DC

## 2019-03-22 NOTE — Telephone Encounter (Signed)
Asking if you will continue to fill her eliquis in the future   Thanks [place]

## 2019-03-22 NOTE — Telephone Encounter (Signed)
Amanda Vasquez- Patient was supposed to follow up with me in May 2020 with repeat labs. She cancelled the appointment and did not reschedule. I can prescribe her eliquis only if she follows up with me and comes for labs as well. Please ask if patient is willing.  Dr. Terese Door- we will touch base with patient and if she is willing to f/u with me I can prescribe her eliquis. Please see above.  Regards, Astrid Divine

## 2019-03-22 NOTE — Telephone Encounter (Signed)
Copied from [place] [id number]. Topic: General - Other >> Mar 22, 2019 12:22 PM Sheran Luz wrote: See patient message from today. Patient states she needs this resolved as quickly as possible.

## 2019-03-25 ENCOUNTER — Encounter: Payer: Self-pay | Admitting: *Deleted

## 2019-03-25 NOTE — Telephone Encounter (Signed)
Opened in error

## 2019-03-26 NOTE — Telephone Encounter (Signed)
She was given a lab appt  And see md but she would like to wait for results before she see's dr Janese Banks and we moved the md appt out a week from when she gets labs done.

## 2019-03-28 ENCOUNTER — Other Ambulatory Visit: Payer: Self-pay

## 2019-03-28 ENCOUNTER — Inpatient Hospital Stay: Payer: Medicare Other | Admitting: Oncology

## 2019-03-28 ENCOUNTER — Inpatient Hospital Stay: Payer: Medicare Other | Attending: Oncology

## 2019-03-28 DIAGNOSIS — G894 Chronic pain syndrome: Secondary | ICD-10-CM | POA: Diagnosis not present

## 2019-03-28 DIAGNOSIS — R0781 Pleurodynia: Secondary | ICD-10-CM | POA: Diagnosis not present

## 2019-03-28 DIAGNOSIS — Z7984 Long term (current) use of oral hypoglycemic drugs: Secondary | ICD-10-CM | POA: Diagnosis not present

## 2019-03-28 DIAGNOSIS — Z87891 Personal history of nicotine dependence: Secondary | ICD-10-CM | POA: Insufficient documentation

## 2019-03-28 DIAGNOSIS — E785 Hyperlipidemia, unspecified: Secondary | ICD-10-CM | POA: Insufficient documentation

## 2019-03-28 DIAGNOSIS — E119 Type 2 diabetes mellitus without complications: Secondary | ICD-10-CM | POA: Diagnosis not present

## 2019-03-28 DIAGNOSIS — Z7901 Long term (current) use of anticoagulants: Secondary | ICD-10-CM | POA: Diagnosis not present

## 2019-03-28 DIAGNOSIS — E669 Obesity, unspecified: Secondary | ICD-10-CM | POA: Diagnosis not present

## 2019-03-28 DIAGNOSIS — Z86711 Personal history of pulmonary embolism: Secondary | ICD-10-CM | POA: Diagnosis not present

## 2019-03-28 DIAGNOSIS — I2609 Other pulmonary embolism with acute cor pulmonale: Secondary | ICD-10-CM | POA: Insufficient documentation

## 2019-03-28 DIAGNOSIS — D6862 Lupus anticoagulant syndrome: Secondary | ICD-10-CM | POA: Diagnosis not present

## 2019-03-28 DIAGNOSIS — I2782 Chronic pulmonary embolism: Secondary | ICD-10-CM | POA: Diagnosis not present

## 2019-03-28 DIAGNOSIS — D6861 Antiphospholipid syndrome: Secondary | ICD-10-CM | POA: Diagnosis not present

## 2019-03-28 DIAGNOSIS — Z79899 Other long term (current) drug therapy: Secondary | ICD-10-CM | POA: Diagnosis not present

## 2019-03-30 LAB — BETA-2-GLYCOPROTEIN I ABS, IGG/M/A
Beta-2 Glyco I IgG: 136 GPI IgG units — ABNORMAL HIGH (ref 0–20)
Beta-2-Glycoprotein I IgA: <9 GPI IgA units (ref 0–25)
Beta-2-Glycoprotein I IgM: <9 GPI IgM units (ref 0–32)

## 2019-03-30 LAB — PROTEIN C, TOTAL: Protein C, Total: 151 % — ABNORMAL HIGH (ref 60–150)

## 2019-03-31 LAB — PROTEIN C ACTIVITY: Protein C Activity: 187 % — ABNORMAL HIGH (ref 73–180)

## 2019-03-31 LAB — PROTEIN S PANEL
Protein S Activity: 89 % (ref 63–140)
Protein S Ag, Free: 63 % (ref 57–157)
Protein S Ag, Total: 77 % (ref 60–150)

## 2019-04-02 ENCOUNTER — Other Ambulatory Visit: Payer: Self-pay

## 2019-04-02 ENCOUNTER — Ambulatory Visit: Payer: Medicare Other | Admitting: Neurology

## 2019-04-02 DIAGNOSIS — F5101 Primary insomnia: Secondary | ICD-10-CM

## 2019-04-02 DIAGNOSIS — G43711 Chronic migraine without aura, intractable, with status migrainosus: Secondary | ICD-10-CM

## 2019-04-02 MED ORDER — NURTEC 75 MG PO TBDP: 75.0000 mg | ORAL_TABLET | Freq: Every day | ORAL | 6 refills | Status: DC | PRN

## 2019-04-02 MED ORDER — KETOROLAC TROMETHAMINE 60 MG/2ML IM SOLN
60.0000 mg | Freq: Once | INTRAMUSCULAR | Status: AC
  Administered 2019-04-02: 60 mg via INTRAMUSCULAR

## 2019-04-02 MED ORDER — LORAZEPAM 0.5 MG PO TABS: 0.5000 mg | ORAL_TABLET | Freq: Every evening | ORAL | 3 refills | Status: DC | PRN

## 2019-04-02 NOTE — Patient Instructions (Signed)
[employer]: Patient drug information Access [employer] here. Copyright [phone] [employer] All rights reserved. (For additional information see "[employer]: Drug information") Brand Names: [place]  Nurtec  What is this drug used for?   It is used to treat migraine headaches.  What do I need to tell my doctor BEFORE I take this drug?   If you are allergic to this drug; any part of this drug; or any other drugs, foods, or substances. Tell your doctor about the allergy and what signs you had.   If you have any of these health problems: Kidney disease or liver disease.   If you take any drugs (prescription or OTC, natural products, vitamins) that must not be taken with this drug, like certain drugs that are used for HIV, infections, or seizures. There are many drugs that must not be taken with this drug.   This is not a list of all drugs or health problems that interact with this drug.   Tell your doctor and pharmacist about all of your drugs (prescription or OTC, natural products, vitamins) and health problems. You must check to make sure that it is safe for you to take this drug with all of your drugs and health problems. Do not start, stop, or change the dose of any drug without checking with your doctor.  What are some things I need to know or do while I take this drug?   Tell all of your health care providers that you take this drug. This includes your doctors, nurses, pharmacists, and dentists.   This drug is not meant to prevent or lower the number of migraine headaches you get.   Tell your doctor if you are pregnant, plan on getting pregnant, or are breast-feeding. You will need to talk about the benefits and risks to you and the baby.  What are some side effects that I need to call my doctor about right away?   WARNING/CAUTION: Even though it may be rare, some people may have very bad and sometimes deadly side effects when taking a drug. Tell your doctor or get medical help right  away if you have any of the following signs or symptoms that may be related to a very bad side effect:   Signs of an allergic reaction, like rash; hives; itching; red, swollen, blistered, or peeling skin with or without fever; wheezing; tightness in the chest or throat; trouble breathing, swallowing, or talking; unusual hoarseness; or swelling of the mouth, face, lips, tongue, or throat.  What are some other side effects of this drug?   All drugs may cause side effects. However, many people have no side effects or only have minor side effects. Call your doctor or get medical help if any of these side effects or any other side effects bother you or do not go away:   Upset stomach.   These are not all of the side effects that may occur. If you have questions about side effects, call your doctor. Call your doctor for medical advice about side effects.   You may report side effects to your national health agency.  How is this drug best taken?   Use this drug as ordered by your doctor. Read all information given to you. Follow all instructions closely.   Do not push the tablet out of the foil when opening. Use dry hands to take it from the foil. Place on your tongue and let it dissolve. Water is not needed. Do not swallow it whole. Do  not chew, break, or crush it.   If needed, you may place the tablet under the tongue.   Use right after opening.  What do I do if I miss a dose?   This drug is taken on an as needed basis. Do not take more often than told by the doctor.  How do I store and/or throw out this drug?   Store at room temperature in a dry place. Do not store in a bathroom.   Store in foil pouch until ready for use.   Keep all drugs in a safe place. Keep all drugs out of the reach of children and pets.   Throw away unused or expired drugs. Do not flush down a toilet or pour down a drain unless you are told to do so. Check with your pharmacist if you have questions about the best way to throw  out drugs. There may be drug take-back programs in your area.  General drug facts   If your symptoms or health problems do not get better or if they become worse, call your doctor.   Do not share your drugs with others and do not take anyone else's drugs.   Some drugs may have another patient information leaflet. If you have any questions about this drug, please talk with your doctor, nurse, pharmacist, or other health care provider.   If you think there has been an overdose, call your poison control center or get medical care right away. Be ready to tell or show what was taken, how much, and when it happened.  Lorazepam tablets What is this medicine? LORAZEPAM (lor A ze pam) is a benzodiazepine. It is used to treat anxiety. This medicine may be used for other purposes; ask your health care provider or pharmacist if you have questions. COMMON BRAND NAME(S): Ativan What should I tell my health care provider before I take this medicine? They need to know if you have any of these conditions:  glaucoma  history of drug or alcohol abuse problem  kidney disease  liver disease  lung or breathing disease, like asthma  mental illness  myasthenia gravis  Parkinson's disease  suicidal thoughts, plans, or attempt; a previous suicide attempt by you or a family member  an unusual or allergic reaction to lorazepam, other medicines, foods, dyes, or preservatives  pregnant or trying to get pregnant  breast-feeding How should I use this medicine? Take this medicine by mouth with a glass of water. Follow the directions on the prescription label. Take your medicine at regular intervals. Do not take it more often than directed. Do not stop taking except on your doctor's advice. A special MedGuide will be given to you by the pharmacist with each prescription and refill. Be sure to read this information carefully each time. Talk to your pediatrician regarding the use of this medicine in children.  While this drug may be used in children as young as 12 years for selected conditions, precautions do apply. Overdosage: If you think you have taken too much of this medicine contact a poison control center or emergency room at once. NOTE: This medicine is only for you. Do not share this medicine with others. What if I miss a dose? If you miss a dose, take it as soon as you can. If it is almost time for your next dose, take only that dose. Do not take double or extra doses. What may interact with this medicine? Do not take this medicine with any of the  following medications:  narcotic medicines for cough  sodium oxybate This medicine may also interact with the following medications:  alcohol  antihistamines for allergy, cough and cold  certain medicines for anxiety or sleep  certain medicines for depression, like amitriptyline, fluoxetine, sertraline  certain medicines for seizures like carbamazepine, phenobarbital, phenytoin, primidone  general anesthetics like lidocaine, pramoxine, tetracaine  MAOIs like Carbex, Eldepryl, Marplan, Nardil, and Parnate  medicines that relax muscles for surgery  narcotic medicines for pain  phenothiazines like chlorpromazine, mesoridazine, prochlorperazine, thioridazine This list may not describe all possible interactions. Give your health care provider a list of all the medicines, herbs, non-prescription drugs, or dietary supplements you use. Also tell them if you smoke, drink alcohol, or use illegal drugs. Some items may interact with your medicine. What should I watch for while using this medicine? Tell your doctor or health care professional if your symptoms do not start to get better or if they get worse. Do not stop taking except on your doctor's advice. You may develop a severe reaction. Your doctor will tell you how much medicine to take. You may get drowsy or dizzy. Do not drive, use machinery, or do anything that needs mental alertness  until you know how this medicine affects you. To reduce the risk of dizzy and fainting spells, do not stand or sit up quickly, especially if you are an older patient. Alcohol may increase dizziness and drowsiness. Avoid alcoholic drinks. If you are taking another medicine that also causes drowsiness, you may have more side effects. Give your health care provider a list of all medicines you use. Your doctor will tell you how much medicine to take. Do not take more medicine than directed. Call emergency for help if you have problems breathing or unusual sleepiness. What side effects may I notice from receiving this medicine? Side effects that you should report to your doctor or health care professional as soon as possible:  allergic reactions like skin rash, itching or hives, swelling of the face, lips, or tongue  breathing problems  confusion  loss of balance or coordination  signs and symptoms of low blood pressure like dizziness; feeling faint or lightheaded, falls; unusually weak or tired  suicidal thoughts or other mood changes Side effects that usually do not require medical attention (report to your doctor or health care professional if they continue or are bothersome):  dizziness  headache  nausea, vomiting  tiredness This list may not describe all possible side effects. Call your doctor for medical advice about side effects. You may report side effects to FDA at 1-800-FDA-1088. Where should I keep my medicine? Keep out of the reach of children. This medicine can be abused. Keep your medicine in a safe place to protect it from theft. Do not share this medicine with anyone. Selling or giving away this medicine is dangerous and against the law. This medicine may cause accidental overdose and death if taken by other adults, children, or pets. Mix any unused medicine with a substance like cat litter or coffee grounds. Then throw the medicine away in a sealed container like a sealed bag  or a coffee can with a lid. Do not use the medicine after the expiration date. Store at room temperature between 20 and 25 degrees C (68 and 77 degrees F). Protect from light. Keep container tightly closed. NOTE: This sheet is a summary. It may not cover all possible information. If you have questions about this medicine, talk to your doctor, pharmacist,  or health care provider.  2020 [employer]/Gold Standard (2015-05-07 15:54:27)

## 2019-04-02 NOTE — Progress Notes (Signed)
Botox- 100 units x 2 vials Lot: [id number] Expiration: 10/2021 NDC: 9872-1587-27  Bacteriostatic 0.9% Sodium Chloride- 60mL total Lot: [id number] Expiration: 05/23/2019 NDC: 9276-3943-20  Dx: Q37.944 B/B  Pt given Toradol 60 mg IM injection per order. Pt tolerated well. No bleeding. Bandaid applied. See MAR.

## 2019-04-02 NOTE — Progress Notes (Signed)
Consent Form Botulism Toxin Injection For Chronic Migraine   Interval history: Continues to do extremely well > 75% decrease freq headaches. no temples, not masseters, not eyes, + levators   Reviewed orally with patient, additionally signature is on file:  Botulism toxin has been approved by the Federal drug administration for treatment of chronic migraine. Botulism toxin does not cure chronic migraine and it may not be effective in some patients.  The administration of botulism toxin is accomplished by injecting a small amount of toxin into the muscles of the neck and head. Dosage must be titrated for each individual. Any benefits resulting from botulism toxin tend to wear off after 3 months with a repeat injection required if benefit is to be maintained. Injections are usually done every 3-4 months with maximum effect peak achieved by about 2 or 3 weeks. Botulism toxin is expensive and you should be sure of what costs you will incur resulting from the injection.  The side effects of botulism toxin use for chronic migraine may include:   -Transient, and usually mild, facial weakness with facial injections  -Transient, and usually mild, head or neck weakness with head/neck injections  -Reduction or loss of forehead facial animation due to forehead muscle weakness  -Eyelid drooping  -Dry eye  -Pain at the site of injection or bruising at the site of injection  -Double vision  -Potential unknown long term risks  Contraindications: You should not have Botox if you are pregnant, nursing, allergic to albumin, have an infection, skin condition, or muscle weakness at the site of the injection, or have myasthenia gravis, Lambert-Eaton syndrome, or ALS.  It is also possible that as with any injection, there may be an allergic reaction or no effect from the medication. Reduced effectiveness after repeated injections is sometimes seen and rarely infection at the injection site may occur. All care  will be taken to prevent these side effects. If therapy is given over a long time, atrophy and wasting in the muscle injected may occur. Occasionally the patient's become refractory to treatment because they develop antibodies to the toxin. In this event, therapy needs to be modified.  I have read the above information and consent to the administration of botulism toxin.    BOTOX PROCEDURE NOTE FOR MIGRAINE HEADACHE    Contraindications and precautions discussed with patient(above). Aseptic procedure was observed and patient tolerated procedure. Procedure performed by Dr. Georgia Dom  The condition has existed for more than 6 months, and pt does not have a diagnosis of ALS, Myasthenia Gravis or Lambert-Eaton Syndrome.  Risks and benefits of injections discussed and pt agrees to proceed with the procedure.  Written consent obtained  These injections are medically necessary. Pt  receives good benefits from these injections. These injections do not cause sedations or hallucinations which the oral therapies may cause.  Description of procedure:  The patient was placed in a sitting position. The standard protocol was used for Botox as follows, with 5 units of Botox injected at each site:   -Procerus muscle, midline injection  -Corrugator muscle, bilateral injection  -Frontalis muscle, bilateral injection, with 2 sites each side, medial injection was performed in the upper one third of the frontalis muscle, in the region vertical from the medial inferior edge of the superior orbital rim. The lateral injection was again in the upper one third of the forehead vertically above the lateral limbus of the cornea, 1.5 cm lateral to the medial injection site.    -Temporalis muscle  injection, 5 sites, bilaterally. The first injection was 3 cm above the tragus of the ear, second injection site was 1.5 cm to 3 cm up from the first injection site in line with the tragus of the ear. The third injection site  was 1.5-3 cm forward between the first 2 injection sites. The fourth injection site was 1.5 cm posterior to the second injection site.   -Occipitalis muscle injection, 3 sites, bilaterally. The first injection was done one half way between the occipital protuberance and the tip of the mastoid process behind the ear. The second injection site was done lateral and superior to the first, 1 fingerbreadth from the first injection. The third injection site was 1 fingerbreadth superiorly and medially from the first injection site.  -Cervical paraspinal muscle injection, 2 sites, bilateral knee first injection site was 1 cm from the midline of the cervical spine, 3 cm inferior to the lower border of the occipital protuberance. The second injection site was 1.5 cm superiorly and laterally to the first injection site.  -Trapezius muscle injection was performed at 3 sites, bilaterally. The first injection site was in the upper trapezius muscle halfway between the inflection point of the neck, and the acromion. The second injection site was one half way between the acromion and the first injection site. The third injection was done between the first injection site and the inflection point of the neck.   Will return for repeat injection in 3 months.   A 200 unit sof Botox was used, any Botox not injected was wasted. The patient tolerated the procedure well, there were no complications of the above procedure.  Performed by Dr. Jaynee Eagles M.D. 48ml Lidocaine 1%, 30-gauge needle was used. All procedures a documented were medically necessary, reasonable and appropriate based on the patient's history, medical diagnosis and physician opinion. Verbal informed consent was obtained from the patient, patient was informed of potential risk of procedure, including bruising, bleeding, hematoma formation, infection, muscle weakness, muscle pain, numbness, transient hypertension, transient hyperglycemia and transient insomnia among  others. All areas injected were topically clean with isopropyl rubbing alcohol. Nonsterile nonlatex gloves were worn during the procedure.  Left pectoralis 3 injection sites based on palpation and pain. Used emg machine to target the muscle. Patient tolerated well no side effects. Felt better after procedure. Called her this evening, no side effects.

## 2019-04-04 ENCOUNTER — Inpatient Hospital Stay: Payer: Medicare Other

## 2019-04-04 ENCOUNTER — Inpatient Hospital Stay (HOSPITAL_BASED_OUTPATIENT_CLINIC_OR_DEPARTMENT_OTHER): Payer: Medicare Other | Admitting: Oncology

## 2019-04-04 ENCOUNTER — Encounter: Payer: Self-pay | Admitting: Oncology

## 2019-04-04 ENCOUNTER — Other Ambulatory Visit: Payer: Self-pay

## 2019-04-04 VITALS — BP 107/71 | HR 88 | Temp 96.6°F | Resp 18 | Wt 217.3 lb

## 2019-04-04 DIAGNOSIS — Z79899 Other long term (current) drug therapy: Secondary | ICD-10-CM | POA: Diagnosis not present

## 2019-04-04 DIAGNOSIS — Z7901 Long term (current) use of anticoagulants: Secondary | ICD-10-CM | POA: Diagnosis not present

## 2019-04-04 DIAGNOSIS — D6862 Lupus anticoagulant syndrome: Secondary | ICD-10-CM | POA: Diagnosis not present

## 2019-04-04 DIAGNOSIS — I2782 Chronic pulmonary embolism: Secondary | ICD-10-CM | POA: Diagnosis not present

## 2019-04-04 DIAGNOSIS — I2609 Other pulmonary embolism with acute cor pulmonale: Secondary | ICD-10-CM

## 2019-04-04 DIAGNOSIS — E119 Type 2 diabetes mellitus without complications: Secondary | ICD-10-CM | POA: Diagnosis not present

## 2019-04-04 DIAGNOSIS — R0781 Pleurodynia: Secondary | ICD-10-CM | POA: Diagnosis not present

## 2019-04-04 DIAGNOSIS — Z87891 Personal history of nicotine dependence: Secondary | ICD-10-CM | POA: Diagnosis not present

## 2019-04-04 DIAGNOSIS — Z7984 Long term (current) use of oral hypoglycemic drugs: Secondary | ICD-10-CM | POA: Diagnosis not present

## 2019-04-04 DIAGNOSIS — Z86711 Personal history of pulmonary embolism: Secondary | ICD-10-CM | POA: Diagnosis not present

## 2019-04-04 DIAGNOSIS — E785 Hyperlipidemia, unspecified: Secondary | ICD-10-CM | POA: Diagnosis not present

## 2019-04-04 DIAGNOSIS — D6861 Antiphospholipid syndrome: Secondary | ICD-10-CM | POA: Diagnosis not present

## 2019-04-04 DIAGNOSIS — G894 Chronic pain syndrome: Secondary | ICD-10-CM | POA: Diagnosis not present

## 2019-04-04 LAB — CBC WITH DIFFERENTIAL/PLATELET
Abs Immature Granulocytes: 0 10*3/uL (ref 0.00–0.07)
Basophils Absolute: 0 10*3/uL (ref 0.0–0.1)
Basophils Relative: 1 %
Eosinophils Absolute: 0.4 10*3/uL (ref 0.0–0.5)
Eosinophils Relative: 6 %
HCT: 42 % (ref 36.0–46.0)
Hemoglobin: 13.5 g/dL (ref 12.0–15.0)
Immature Granulocytes: 0 %
Lymphocytes Relative: 49 %
Lymphs Abs: 3.2 10*3/uL (ref 0.7–4.0)
MCH: 28.3 pg (ref 26.0–34.0)
MCHC: 32.1 g/dL (ref 30.0–36.0)
MCV: 88.1 fL (ref 80.0–100.0)
Monocytes Absolute: 0.4 10*3/uL (ref 0.1–1.0)
Monocytes Relative: 6 %
Neutro Abs: 2.5 10*3/uL (ref 1.7–7.7)
Neutrophils Relative %: 38 %
Platelets: 275 10*3/uL (ref 150–400)
RBC: 4.77 MIL/uL (ref 3.87–5.11)
RDW: 13.1 % (ref 11.5–15.5)
WBC: 6.4 10*3/uL (ref 4.0–10.5)
nRBC: 0 % (ref 0.0–0.2)

## 2019-04-04 LAB — COMPREHENSIVE METABOLIC PANEL
ALT: 15 U/L (ref 0–44)
AST: 15 U/L (ref 15–41)
Albumin: 3.9 g/dL (ref 3.5–5.0)
Alkaline Phosphatase: 65 U/L (ref 38–126)
Anion gap: 8 (ref 5–15)
BUN: 17 mg/dL (ref 6–20)
CO2: 28 mmol/L (ref 22–32)
Calcium: 9.3 mg/dL (ref 8.9–10.3)
Chloride: 104 mmol/L (ref 98–111)
Creatinine, Ser: 0.67 mg/dL (ref 0.44–1.00)
GFR calc Af Amer: >60 mL/min (ref >60)
GFR calc non Af Amer: >60 mL/min (ref >60)
Glucose, Bld: 98 mg/dL (ref 70–99)
Potassium: 5 mmol/L (ref 3.5–5.1)
Sodium: 140 mmol/L (ref 135–145)
Total Bilirubin: 0.3 mg/dL (ref 0.3–1.2)
Total Protein: 6.7 g/dL (ref 6.5–8.1)

## 2019-04-04 LAB — PROTIME-INR
INR: 0.9 (ref 0.8–1.2)
Prothrombin Time: 12.2 seconds (ref 11.4–15.2)

## 2019-04-04 NOTE — Progress Notes (Signed)
Pt in for follow up, reports fell last week taking out recycling.  Has large bruise on right upper arm.

## 2019-04-05 ENCOUNTER — Other Ambulatory Visit: Payer: Self-pay | Admitting: *Deleted

## 2019-04-05 ENCOUNTER — Other Ambulatory Visit: Payer: Self-pay | Admitting: Oncology

## 2019-04-05 ENCOUNTER — Telehealth: Payer: Self-pay | Admitting: *Deleted

## 2019-04-05 MED ORDER — WARFARIN SODIUM 5 MG PO TABS: 5.0000 mg | ORAL_TABLET | Freq: Every day | ORAL | 0 refills | Status: DC

## 2019-04-05 MED ORDER — ENOXAPARIN SODIUM 100 MG/ML ~~LOC~~ SOLN: 100.0000 mg | Freq: Two times a day (BID) | SUBCUTANEOUS | 0 refills | Status: DC
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
## 2019-04-05 NOTE — Telephone Encounter (Signed)
Patient called reporting that she was to have 2 prescriptions sent to pharmacy Coumadin and Lovenox and they are not there. Per office note "I will check a baseline CBC with differential, CMP PT PTT INR today.  She will stop her Eliquis on Monday, 04/08/2019 and go on therapeutic Lovenox 1 mg/kg every 12 hours along with Coumadin 5 mg at night.  This will be her day 1.  She will continue Coumadin 5 mg on day 1 and day 2 and we will check her PT/INR on day 3 and make further recommendations based on that level." Please advise.  ...  Ref Range & Units 1d ago  Prothrombin Time 11.4 - 15.2 seconds 12.2   INR 0.8 - 1.2 0.9   Comment: (NOTE)  INR goal varies based on device and disease states.  Performed at [facility], [address] [place],  [place] [address]   Resulting Agency  [facility]      Specimen Collected: 04/04/19 15:04 Last Resulted: 04/04/19 16:18       ...  Ref Range & Units 1d ago (04/04/19) 78mo ago (09/19/18) 108yr ago (02/15/18) 11yr ago (10/03/17)  WBC 4.0 - 10.5 K/uL 6.4  5.3  5.8  7.7 R   RBC 3.87 - 5.11 MIL/uL 4.77  4.86 R  4.66 R  4.62 R   Hemoglobin 12.0 - 15.0 g/dL 13.5  13.9  12.5  12.3 R   HCT 36.0 - 46.0 % 42.0  42.5  38.2  38.2 R   MCV 80.0 - 100.0 fL 88.1  87.6 R  81.9 R  82.5   MCH 26.0 - 34.0 pg 28.3    26.7   MCHC 30.0 - 36.0 g/dL 32.1  32.6  32.7  32.3 R   RDW 11.5 - 15.5 % 13.1  15.2  15.9High   15.2High  R   Platelets 150 - 400 K/uL 275  247.0 R  256.0 R  235 R, CM   nRBC 0.0 - 0.2 % 0.0      Neutrophils Relative % % 38  53.3 R  48.9 R    Neutro Abs 1.7 - 7.7 K/uL 2.5  2.8 R  2.9 R    Lymphocytes Relative % 49  37.3 R  36.4 R    Lymphs Abs 0.7 - 4.0 K/uL 3.2  2.0  2.1    Monocytes Relative % 6  4.7 R  8.1 R    Monocytes Absolute 0.1 - 1.0 K/uL 0.4  0.2  0.5    Eosinophils Relative % 6  3.9 R  6.0High  R    Eosinophils Absolute 0.0 - 0.5 K/uL 0.4  0.2 R  0.3 R    Basophils Relative % 1  0.8 R  0.6 R    Basophils Absolute 0.0 - 0.1 K/uL 0.0   0.0  0.0    Immature Granulocytes % 0      Abs Immature Granulocytes 0.00 - 0.07 K/uL 0.00      Comment: Performed at [facility], [address] [place], [place] [address]  Resulting Agency  [facility] [place] [facility]      Specimen Collected: 04/04/19 15:04 Last Resulted: 04/04/19 15:12

## 2019-04-05 NOTE — Telephone Encounter (Signed)
I [facility] and I called pt and got her voicemail and let her know that it was sent. I did not realize that the rx printed instead of going electronically

## 2019-04-05 NOTE — Telephone Encounter (Signed)
Patient informed prescription will be sent in today

## 2019-04-05 NOTE — Telephone Encounter (Signed)
We will send it to her pharmacy today

## 2019-04-05 NOTE — Progress Notes (Signed)
ovenox 

## 2019-04-08 DIAGNOSIS — N911 Secondary amenorrhea: Secondary | ICD-10-CM | POA: Diagnosis not present

## 2019-04-08 DIAGNOSIS — Z1231 Encounter for screening mammogram for malignant neoplasm of breast: Secondary | ICD-10-CM | POA: Diagnosis not present

## 2019-04-08 LAB — HM MAMMOGRAPHY

## 2019-04-09 ENCOUNTER — Other Ambulatory Visit: Payer: Self-pay

## 2019-04-10 ENCOUNTER — Other Ambulatory Visit: Payer: Self-pay

## 2019-04-10 ENCOUNTER — Inpatient Hospital Stay: Payer: Medicare Other

## 2019-04-10 ENCOUNTER — Other Ambulatory Visit: Payer: Self-pay | Admitting: *Deleted

## 2019-04-10 ENCOUNTER — Telehealth: Payer: Self-pay | Admitting: *Deleted

## 2019-04-10 DIAGNOSIS — D6861 Antiphospholipid syndrome: Secondary | ICD-10-CM

## 2019-04-10 DIAGNOSIS — I2609 Other pulmonary embolism with acute cor pulmonale: Secondary | ICD-10-CM | POA: Diagnosis not present

## 2019-04-10 DIAGNOSIS — Z87891 Personal history of nicotine dependence: Secondary | ICD-10-CM | POA: Diagnosis not present

## 2019-04-10 DIAGNOSIS — E119 Type 2 diabetes mellitus without complications: Secondary | ICD-10-CM | POA: Diagnosis not present

## 2019-04-10 DIAGNOSIS — Z7984 Long term (current) use of oral hypoglycemic drugs: Secondary | ICD-10-CM | POA: Diagnosis not present

## 2019-04-10 DIAGNOSIS — G894 Chronic pain syndrome: Secondary | ICD-10-CM | POA: Diagnosis not present

## 2019-04-10 DIAGNOSIS — R0781 Pleurodynia: Secondary | ICD-10-CM | POA: Diagnosis not present

## 2019-04-10 DIAGNOSIS — Z86711 Personal history of pulmonary embolism: Secondary | ICD-10-CM | POA: Diagnosis not present

## 2019-04-10 DIAGNOSIS — Z79899 Other long term (current) drug therapy: Secondary | ICD-10-CM | POA: Diagnosis not present

## 2019-04-10 DIAGNOSIS — I2782 Chronic pulmonary embolism: Secondary | ICD-10-CM | POA: Diagnosis not present

## 2019-04-10 DIAGNOSIS — D6862 Lupus anticoagulant syndrome: Secondary | ICD-10-CM | POA: Diagnosis not present

## 2019-04-10 DIAGNOSIS — E785 Hyperlipidemia, unspecified: Secondary | ICD-10-CM | POA: Diagnosis not present

## 2019-04-10 DIAGNOSIS — Z7901 Long term (current) use of anticoagulants: Secondary | ICD-10-CM | POA: Diagnosis not present

## 2019-04-10 LAB — PROTIME-INR
INR: 1.1 (ref 0.8–1.2)
Prothrombin Time: 13.6 seconds (ref 11.4–15.2)

## 2019-04-10 LAB — APTT: aPTT: 41 seconds — ABNORMAL HIGH (ref 24–36)

## 2019-04-10 NOTE — Telephone Encounter (Signed)
Called pt and left message on recorder about her INR was 1.1 and she will need to increase her coumadin dose to 10 mg which will be 2 tablets this evening, 2 tablets on Thursday and come back to check level on Friday. I have left my cell phone number for her to call me. I tried calling home phone also and it just rang until it disconnected.

## 2019-04-10 NOTE — Telephone Encounter (Signed)
Patient called me back on my cell phone and got my message and will take 10 mg coumadin for wed evening, and Thursday evening along with the lovenox inj. q12 hours and will come back to cancer center to get recheck of level on 8/21 at 11 am. Pt agreeable to all the above.

## 2019-04-11 ENCOUNTER — Other Ambulatory Visit: Payer: Self-pay | Admitting: *Deleted

## 2019-04-11 ENCOUNTER — Encounter: Payer: Self-pay | Admitting: Internal Medicine

## 2019-04-11 ENCOUNTER — Telehealth: Payer: Self-pay | Admitting: *Deleted

## 2019-04-11 ENCOUNTER — Encounter: Payer: Self-pay | Admitting: *Deleted

## 2019-04-11 NOTE — Progress Notes (Signed)
Received a notice from [facility] alerting MD of pt's controlled medication fills. Dr. Jaynee Eagles reviewed this and Lorazepam has been discontinued. I called [facility] and spoke with Sherri to let her know of the discontinuation.

## 2019-04-11 NOTE — Telephone Encounter (Signed)
Patient called asking if we could draw a serum Estradiol level when she comes in tomorrow for her Patient INR draw. I told her we need a signed order with a diagnosis on it from her GYN doctor before we can draw it and gave her the fax number fr them to send it to. Please add order when you get the order if in agreement to have it drawn at [facility].

## 2019-04-12 ENCOUNTER — Other Ambulatory Visit: Payer: Self-pay

## 2019-04-12 ENCOUNTER — Telehealth: Payer: Self-pay | Admitting: *Deleted

## 2019-04-12 ENCOUNTER — Other Ambulatory Visit: Payer: Self-pay | Admitting: *Deleted

## 2019-04-12 ENCOUNTER — Telehealth: Payer: Self-pay | Admitting: Hematology & Oncology

## 2019-04-12 ENCOUNTER — Inpatient Hospital Stay: Payer: Medicare Other

## 2019-04-12 DIAGNOSIS — E119 Type 2 diabetes mellitus without complications: Secondary | ICD-10-CM | POA: Diagnosis not present

## 2019-04-12 DIAGNOSIS — Z87891 Personal history of nicotine dependence: Secondary | ICD-10-CM | POA: Diagnosis not present

## 2019-04-12 DIAGNOSIS — Z7901 Long term (current) use of anticoagulants: Secondary | ICD-10-CM | POA: Diagnosis not present

## 2019-04-12 DIAGNOSIS — I2782 Chronic pulmonary embolism: Secondary | ICD-10-CM | POA: Diagnosis not present

## 2019-04-12 DIAGNOSIS — G894 Chronic pain syndrome: Secondary | ICD-10-CM | POA: Diagnosis not present

## 2019-04-12 DIAGNOSIS — D6861 Antiphospholipid syndrome: Secondary | ICD-10-CM | POA: Diagnosis not present

## 2019-04-12 DIAGNOSIS — R0781 Pleurodynia: Secondary | ICD-10-CM | POA: Diagnosis not present

## 2019-04-12 DIAGNOSIS — Z86711 Personal history of pulmonary embolism: Secondary | ICD-10-CM | POA: Diagnosis not present

## 2019-04-12 DIAGNOSIS — Z79899 Other long term (current) drug therapy: Secondary | ICD-10-CM | POA: Diagnosis not present

## 2019-04-12 DIAGNOSIS — E785 Hyperlipidemia, unspecified: Secondary | ICD-10-CM | POA: Diagnosis not present

## 2019-04-12 DIAGNOSIS — D6862 Lupus anticoagulant syndrome: Secondary | ICD-10-CM | POA: Diagnosis not present

## 2019-04-12 DIAGNOSIS — N911 Secondary amenorrhea: Secondary | ICD-10-CM

## 2019-04-12 DIAGNOSIS — Z7984 Long term (current) use of oral hypoglycemic drugs: Secondary | ICD-10-CM | POA: Diagnosis not present

## 2019-04-12 DIAGNOSIS — I2609 Other pulmonary embolism with acute cor pulmonale: Secondary | ICD-10-CM | POA: Diagnosis not present

## 2019-04-12 LAB — PROTIME-INR
INR: 1.8 — ABNORMAL HIGH (ref 0.8–1.2)
Prothrombin Time: 20.3 seconds — ABNORMAL HIGH (ref 11.4–15.2)

## 2019-04-12 LAB — APTT: aPTT: 53 seconds — ABNORMAL HIGH (ref 24–36)

## 2019-04-12 NOTE — Telephone Encounter (Signed)
Per 8/21 sch msg, lmom to inform patient of new patient appt 9/3 at 130 pm for 2nd opinion.

## 2019-04-12 NOTE — Telephone Encounter (Signed)
I called md office and pt to let her know that we will draw the estradiol level in for pt to get this am when she comes to cancer center

## 2019-04-12 NOTE — Progress Notes (Signed)
PT

## 2019-04-12 NOTE — Telephone Encounter (Signed)
Called pt and let her know that INR 1.8, cont. 10 mg coumadin and lovenox shots and check another value on Monday and pt agreeable to the plan and she will come Monday at 11 am

## 2019-04-12 NOTE — Telephone Encounter (Signed)
Can we get something from Gyn stating why they want a estradiol level before we order?

## 2019-04-12 NOTE — Telephone Encounter (Signed)
-----   Message from Sindy Guadeloupe, MD sent at 04/12/2019 11:56 AM EDT ----- Continue 10 mg coumadin daily and repeat pt inr on Monday. Continue lovenox

## 2019-04-12 NOTE — Telephone Encounter (Signed)
Patient would like to come to see Dr Marin Olp for a second opinion regarding her new diagnosis. She is currently a patient at the [facility] [place].   Message sent to scheduler to schedule with Dr Marin Olp. Message also sent to Dr Elroy Channel RN at [facility] as a curtousey notification of patients appointment.

## 2019-04-13 LAB — ESTRADIOL: Estradiol: <5 pg/mL

## 2019-04-15 ENCOUNTER — Other Ambulatory Visit: Payer: Self-pay | Admitting: *Deleted

## 2019-04-15 ENCOUNTER — Inpatient Hospital Stay: Payer: Medicare Other

## 2019-04-15 ENCOUNTER — Telehealth: Payer: Self-pay | Admitting: *Deleted

## 2019-04-15 ENCOUNTER — Other Ambulatory Visit: Payer: Self-pay

## 2019-04-15 DIAGNOSIS — E785 Hyperlipidemia, unspecified: Secondary | ICD-10-CM | POA: Diagnosis not present

## 2019-04-15 DIAGNOSIS — I2782 Chronic pulmonary embolism: Secondary | ICD-10-CM | POA: Diagnosis not present

## 2019-04-15 DIAGNOSIS — D6862 Lupus anticoagulant syndrome: Secondary | ICD-10-CM | POA: Diagnosis not present

## 2019-04-15 DIAGNOSIS — G890 Central pain syndrome: Secondary | ICD-10-CM | POA: Diagnosis not present

## 2019-04-15 DIAGNOSIS — E119 Type 2 diabetes mellitus without complications: Secondary | ICD-10-CM | POA: Diagnosis not present

## 2019-04-15 DIAGNOSIS — R0781 Pleurodynia: Secondary | ICD-10-CM | POA: Diagnosis not present

## 2019-04-15 DIAGNOSIS — Z86711 Personal history of pulmonary embolism: Secondary | ICD-10-CM | POA: Diagnosis not present

## 2019-04-15 DIAGNOSIS — Z7901 Long term (current) use of anticoagulants: Secondary | ICD-10-CM | POA: Diagnosis not present

## 2019-04-15 DIAGNOSIS — Z79899 Other long term (current) drug therapy: Secondary | ICD-10-CM | POA: Diagnosis not present

## 2019-04-15 DIAGNOSIS — I2609 Other pulmonary embolism with acute cor pulmonale: Secondary | ICD-10-CM | POA: Diagnosis not present

## 2019-04-15 DIAGNOSIS — Z87891 Personal history of nicotine dependence: Secondary | ICD-10-CM | POA: Diagnosis not present

## 2019-04-15 DIAGNOSIS — D6861 Antiphospholipid syndrome: Secondary | ICD-10-CM

## 2019-04-15 DIAGNOSIS — G894 Chronic pain syndrome: Secondary | ICD-10-CM | POA: Diagnosis not present

## 2019-04-15 DIAGNOSIS — Z79891 Long term (current) use of opiate analgesic: Secondary | ICD-10-CM | POA: Diagnosis not present

## 2019-04-15 DIAGNOSIS — R110 Nausea: Secondary | ICD-10-CM | POA: Diagnosis not present

## 2019-04-15 DIAGNOSIS — Z7984 Long term (current) use of oral hypoglycemic drugs: Secondary | ICD-10-CM | POA: Diagnosis not present

## 2019-04-15 LAB — PROTIME-INR
INR: 2.7 — ABNORMAL HIGH (ref 0.8–1.2)
Prothrombin Time: 28.3 seconds — ABNORMAL HIGH (ref 11.4–15.2)

## 2019-04-15 NOTE — Telephone Encounter (Signed)
Called pt. And let her know that her INR was 2.7 today and she will need to cont. Lovenox and change the coumadin to 7.5 mg daily starting this evening. Come back on Thursday of this week and if level ok then she will be able to stop the lovenox and just be on coumadin. Next PT/INR is 8/27 at 11:15 and pt. Agreeable to the plan.

## 2019-04-15 NOTE — Telephone Encounter (Signed)
-----   Message from Sindy Guadeloupe, MD sent at 04/15/2019  1:02 PM EDT ----- She should take coumadin 7.5 mg daily and repeat inr on Thursday. If therapeutic can stop lovenox on Thursday

## 2019-04-17 ENCOUNTER — Other Ambulatory Visit: Payer: Self-pay

## 2019-04-18 ENCOUNTER — Inpatient Hospital Stay: Payer: Medicare Other

## 2019-04-18 ENCOUNTER — Other Ambulatory Visit: Payer: Self-pay

## 2019-04-18 ENCOUNTER — Telehealth: Payer: Self-pay | Admitting: *Deleted

## 2019-04-18 DIAGNOSIS — I2609 Other pulmonary embolism with acute cor pulmonale: Secondary | ICD-10-CM | POA: Diagnosis not present

## 2019-04-18 DIAGNOSIS — Z87891 Personal history of nicotine dependence: Secondary | ICD-10-CM | POA: Diagnosis not present

## 2019-04-18 DIAGNOSIS — Z79899 Other long term (current) drug therapy: Secondary | ICD-10-CM | POA: Diagnosis not present

## 2019-04-18 DIAGNOSIS — D6861 Antiphospholipid syndrome: Secondary | ICD-10-CM

## 2019-04-18 DIAGNOSIS — D6862 Lupus anticoagulant syndrome: Secondary | ICD-10-CM | POA: Diagnosis not present

## 2019-04-18 DIAGNOSIS — Z7901 Long term (current) use of anticoagulants: Secondary | ICD-10-CM | POA: Diagnosis not present

## 2019-04-18 DIAGNOSIS — Z86711 Personal history of pulmonary embolism: Secondary | ICD-10-CM | POA: Diagnosis not present

## 2019-04-18 DIAGNOSIS — I2782 Chronic pulmonary embolism: Secondary | ICD-10-CM | POA: Diagnosis not present

## 2019-04-18 DIAGNOSIS — G894 Chronic pain syndrome: Secondary | ICD-10-CM | POA: Diagnosis not present

## 2019-04-18 DIAGNOSIS — Z7984 Long term (current) use of oral hypoglycemic drugs: Secondary | ICD-10-CM | POA: Diagnosis not present

## 2019-04-18 DIAGNOSIS — E785 Hyperlipidemia, unspecified: Secondary | ICD-10-CM | POA: Diagnosis not present

## 2019-04-18 DIAGNOSIS — E119 Type 2 diabetes mellitus without complications: Secondary | ICD-10-CM | POA: Diagnosis not present

## 2019-04-18 DIAGNOSIS — R0781 Pleurodynia: Secondary | ICD-10-CM | POA: Diagnosis not present

## 2019-04-18 LAB — PROTIME-INR
INR: 2.3 — ABNORMAL HIGH (ref 0.8–1.2)
Prothrombin Time: 24.8 seconds — ABNORMAL HIGH (ref 11.4–15.2)

## 2019-04-18 NOTE — Telephone Encounter (Signed)
-----   Message from Sindy Guadeloupe, MD sent at 04/18/2019 11:58 AM EDT ----- She will stop lovenox at this time. Continue coumadin at 7.5 mg daily. Check pt inr on 8/31. Thanks, Astrid Divine

## 2019-04-18 NOTE — Telephone Encounter (Signed)
Called pt and let her know that the INR 2.3 today and that she can stop the lovenox today, cont. The coumadin at 7.5 mg daily and get a recheck on Monday 8/31. Pt agreeable and would like 8/31 11 am. appt made and she will be there.

## 2019-04-22 ENCOUNTER — Inpatient Hospital Stay: Payer: Medicare Other

## 2019-04-22 ENCOUNTER — Other Ambulatory Visit: Payer: Self-pay

## 2019-04-22 ENCOUNTER — Other Ambulatory Visit: Payer: Self-pay | Admitting: Oncology

## 2019-04-22 ENCOUNTER — Other Ambulatory Visit: Payer: Self-pay | Admitting: Internal Medicine

## 2019-04-22 DIAGNOSIS — Z7984 Long term (current) use of oral hypoglycemic drugs: Secondary | ICD-10-CM | POA: Diagnosis not present

## 2019-04-22 DIAGNOSIS — Z87891 Personal history of nicotine dependence: Secondary | ICD-10-CM | POA: Diagnosis not present

## 2019-04-22 DIAGNOSIS — I2782 Chronic pulmonary embolism: Secondary | ICD-10-CM | POA: Diagnosis not present

## 2019-04-22 DIAGNOSIS — Z79899 Other long term (current) drug therapy: Secondary | ICD-10-CM | POA: Diagnosis not present

## 2019-04-22 DIAGNOSIS — E119 Type 2 diabetes mellitus without complications: Secondary | ICD-10-CM | POA: Diagnosis not present

## 2019-04-22 DIAGNOSIS — Z7901 Long term (current) use of anticoagulants: Secondary | ICD-10-CM | POA: Diagnosis not present

## 2019-04-22 DIAGNOSIS — D6861 Antiphospholipid syndrome: Secondary | ICD-10-CM

## 2019-04-22 DIAGNOSIS — I2609 Other pulmonary embolism with acute cor pulmonale: Secondary | ICD-10-CM | POA: Diagnosis not present

## 2019-04-22 DIAGNOSIS — D6862 Lupus anticoagulant syndrome: Secondary | ICD-10-CM | POA: Diagnosis not present

## 2019-04-22 DIAGNOSIS — Z86711 Personal history of pulmonary embolism: Secondary | ICD-10-CM | POA: Diagnosis not present

## 2019-04-22 DIAGNOSIS — G894 Chronic pain syndrome: Secondary | ICD-10-CM | POA: Diagnosis not present

## 2019-04-22 DIAGNOSIS — E785 Hyperlipidemia, unspecified: Secondary | ICD-10-CM | POA: Diagnosis not present

## 2019-04-22 DIAGNOSIS — R0781 Pleurodynia: Secondary | ICD-10-CM | POA: Diagnosis not present

## 2019-04-22 LAB — PROTIME-INR
INR: 3.6 — ABNORMAL HIGH (ref 0.8–1.2)
Prothrombin Time: 35 seconds — ABNORMAL HIGH (ref 11.4–15.2)

## 2019-04-22 MED ORDER — WARFARIN SODIUM 5 MG PO TABS: 5.0000 mg | ORAL_TABLET | Freq: Every day | ORAL | 0 refills | Status: DC

## 2019-04-22 NOTE — Progress Notes (Signed)
I called Dr. Janese Banks to ask when to recheck level and she states this Friday and she has decided to tell pt to take 5 mg today and then alternate 7.5 mg and cont. To alternate every day. Dr. Janese Banks originally told me to hold today and I had her on the phone holding so I just told her 5 mg today alternating with 7.5 mg and she is agreeable and made f/u appt for 9/4 11 am for lab check. I will call refill in for the coumadin per pt request.

## 2019-04-23 ENCOUNTER — Telehealth: Payer: Self-pay | Admitting: *Deleted

## 2019-04-23 ENCOUNTER — Telehealth: Payer: Self-pay | Admitting: Internal Medicine

## 2019-04-23 ENCOUNTER — Encounter: Payer: Self-pay | Admitting: Internal Medicine

## 2019-04-23 ENCOUNTER — Ambulatory Visit (INDEPENDENT_AMBULATORY_CARE_PROVIDER_SITE_OTHER): Payer: Medicare Other | Admitting: Internal Medicine

## 2019-04-23 VITALS — Ht 65.0 in | Wt 209.6 lb

## 2019-04-23 DIAGNOSIS — F419 Anxiety disorder, unspecified: Secondary | ICD-10-CM

## 2019-04-23 DIAGNOSIS — Z1389 Encounter for screening for other disorder: Secondary | ICD-10-CM | POA: Diagnosis not present

## 2019-04-23 DIAGNOSIS — F32A Depression, unspecified: Secondary | ICD-10-CM

## 2019-04-23 DIAGNOSIS — I2782 Chronic pulmonary embolism: Secondary | ICD-10-CM

## 2019-04-23 DIAGNOSIS — E119 Type 2 diabetes mellitus without complications: Secondary | ICD-10-CM | POA: Diagnosis not present

## 2019-04-23 DIAGNOSIS — E785 Hyperlipidemia, unspecified: Secondary | ICD-10-CM

## 2019-04-23 DIAGNOSIS — D6861 Antiphospholipid syndrome: Secondary | ICD-10-CM | POA: Diagnosis not present

## 2019-04-23 DIAGNOSIS — I2609 Other pulmonary embolism with acute cor pulmonale: Secondary | ICD-10-CM

## 2019-04-23 DIAGNOSIS — F329 Major depressive disorder, single episode, unspecified: Secondary | ICD-10-CM

## 2019-04-23 MED ORDER — SERTRALINE HCL 100 MG PO TABS: 150.0000 mg | ORAL_TABLET | Freq: Every day | ORAL | 3 refills | Status: DC

## 2019-04-23 MED ORDER — ALPRAZOLAM 0.5 MG PO TABS: 0.5000 mg | ORAL_TABLET | Freq: Every day | ORAL | 5 refills | Status: DC | PRN

## 2019-04-23 NOTE — Telephone Encounter (Signed)
-----   Message from Sindy Guadeloupe, MD sent at 04/23/2019  1:27 PM EDT ----- Can you please add a lipid panel to her next labs?  ----- Message ----- From: McLean-Scocuzza, Nino Glow, MD Sent: 04/23/2019   1:14 PM EDT To: Sindy Guadeloupe, MD  Hi  Can you check lipid panel with labs upcoming for pt ?  Also [facility] thinking about HRT progesterone based for pt is this a good idea with antiphos lipid syndrome?   [place]

## 2019-04-23 NOTE — Telephone Encounter (Signed)
Called pt to let her know that PCP sent message to Dr. Janese Banks asking if she can add lipid panel on to the blood work pt is having on this Friday. Dr. Janese Banks said yes. I told pt that for her to get the lipid panel done that she needs to be fasting for 12 hour prior to the lab appt . Time. Pt agreeable and will be fasting

## 2019-04-23 NOTE — Telephone Encounter (Signed)
Call pt  Per Dr. Elsie Amis are right. HRT(hormone replacement therapy) I.e progesterone would not be a good option for her. Although she is anti coagulated, I worry adding more risk factors.
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
## 2019-04-23 NOTE — Progress Notes (Signed)
Fasting BS this morning was 84.  Went to GYN and had a serum estradiol drawn and it was super low at a 5. The doctor there is talking about starting her on hormone therapy.

## 2019-04-23 NOTE — Telephone Encounter (Signed)
-----   Message from Sindy Guadeloupe, MD sent at 04/23/2019  1:26 PM EDT ----- Sure I can add a lipid panel. Do you need any fasting labs?  You are right. HRT progesterone would not be a good option for her. Although she is anti coagulated, I worry adding more risk factors.  Regards, Archana ----- Message ----- From: McLean-Scocuzza, Nino Glow, MD Sent: 04/23/2019   1:14 PM EDT To: Sindy Guadeloupe, MD  Hi  Can you check lipid panel with labs upcoming for pt ?  Also [facility] thinking about HRT progesterone based for pt is this a good idea with antiphos lipid syndrome?   [place]

## 2019-04-24 NOTE — Telephone Encounter (Signed)
Patient is aware.  Patient said that she will talk with her OB-GYN to discuss a game plan/other options.

## 2019-04-25 ENCOUNTER — Other Ambulatory Visit: Payer: Self-pay | Admitting: Family

## 2019-04-25 ENCOUNTER — Other Ambulatory Visit: Payer: Self-pay

## 2019-04-25 ENCOUNTER — Inpatient Hospital Stay: Payer: Medicare Other | Attending: Hematology & Oncology | Admitting: Family

## 2019-04-25 ENCOUNTER — Encounter: Payer: Self-pay | Admitting: Family

## 2019-04-25 ENCOUNTER — Inpatient Hospital Stay: Payer: Medicare Other

## 2019-04-25 VITALS — BP 88/61 | HR 88 | Temp 97.5°F | Resp 16 | Wt 216.8 lb

## 2019-04-25 DIAGNOSIS — E119 Type 2 diabetes mellitus without complications: Secondary | ICD-10-CM | POA: Insufficient documentation

## 2019-04-25 DIAGNOSIS — E785 Hyperlipidemia, unspecified: Secondary | ICD-10-CM | POA: Diagnosis not present

## 2019-04-25 DIAGNOSIS — Z79899 Other long term (current) drug therapy: Secondary | ICD-10-CM | POA: Insufficient documentation

## 2019-04-25 DIAGNOSIS — D6861 Antiphospholipid syndrome: Secondary | ICD-10-CM | POA: Insufficient documentation

## 2019-04-25 DIAGNOSIS — F329 Major depressive disorder, single episode, unspecified: Secondary | ICD-10-CM | POA: Diagnosis not present

## 2019-04-25 DIAGNOSIS — Z7984 Long term (current) use of oral hypoglycemic drugs: Secondary | ICD-10-CM | POA: Insufficient documentation

## 2019-04-25 DIAGNOSIS — K3184 Gastroparesis: Secondary | ICD-10-CM | POA: Diagnosis not present

## 2019-04-25 DIAGNOSIS — I2609 Other pulmonary embolism with acute cor pulmonale: Secondary | ICD-10-CM

## 2019-04-25 DIAGNOSIS — R112 Nausea with vomiting, unspecified: Secondary | ICD-10-CM | POA: Diagnosis not present

## 2019-04-25 DIAGNOSIS — Z7901 Long term (current) use of anticoagulants: Secondary | ICD-10-CM

## 2019-04-25 DIAGNOSIS — R0602 Shortness of breath: Secondary | ICD-10-CM | POA: Diagnosis not present

## 2019-04-25 DIAGNOSIS — I2782 Chronic pulmonary embolism: Secondary | ICD-10-CM | POA: Diagnosis not present

## 2019-04-25 DIAGNOSIS — R42 Dizziness and giddiness: Secondary | ICD-10-CM | POA: Diagnosis not present

## 2019-04-25 DIAGNOSIS — Z87891 Personal history of nicotine dependence: Secondary | ICD-10-CM | POA: Insufficient documentation

## 2019-04-25 DIAGNOSIS — K5900 Constipation, unspecified: Secondary | ICD-10-CM | POA: Diagnosis not present

## 2019-04-25 DIAGNOSIS — Z86010 Personal history of colonic polyps: Secondary | ICD-10-CM | POA: Insufficient documentation

## 2019-04-25 DIAGNOSIS — Z86711 Personal history of pulmonary embolism: Secondary | ICD-10-CM | POA: Insufficient documentation

## 2019-04-25 LAB — CBC WITH DIFFERENTIAL (CANCER CENTER ONLY)
Abs Immature Granulocytes: 0.01 10*3/uL (ref 0.00–0.07)
Basophils Absolute: 0 10*3/uL (ref 0.0–0.1)
Basophils Relative: 1 %
Eosinophils Absolute: 0.5 10*3/uL (ref 0.0–0.5)
Eosinophils Relative: 7 %
HCT: 39.1 % (ref 36.0–46.0)
Hemoglobin: 12.7 g/dL (ref 12.0–15.0)
Immature Granulocytes: 0 %
Lymphocytes Relative: 43 %
Lymphs Abs: 3.1 10*3/uL (ref 0.7–4.0)
MCH: 29 pg (ref 26.0–34.0)
MCHC: 32.5 g/dL (ref 30.0–36.0)
MCV: 89.3 fL (ref 80.0–100.0)
Monocytes Absolute: 0.4 10*3/uL (ref 0.1–1.0)
Monocytes Relative: 6 %
Neutro Abs: 3.2 10*3/uL (ref 1.7–7.7)
Neutrophils Relative %: 43 %
Platelet Count: 272 10*3/uL (ref 150–400)
RBC: 4.38 MIL/uL (ref 3.87–5.11)
RDW: 13.3 % (ref 11.5–15.5)
WBC Count: 7.3 10*3/uL (ref 4.0–10.5)
nRBC: 0 % (ref 0.0–0.2)

## 2019-04-25 LAB — CMP (CANCER CENTER ONLY)
ALT: 25 U/L (ref 0–44)
AST: 13 U/L — ABNORMAL LOW (ref 15–41)
Albumin: 3.9 g/dL (ref 3.5–5.0)
Alkaline Phosphatase: 71 U/L (ref 38–126)
Anion gap: 7 (ref 5–15)
BUN: 15 mg/dL (ref 6–20)
CO2: 29 mmol/L (ref 22–32)
Calcium: 9.1 mg/dL (ref 8.9–10.3)
Chloride: 100 mmol/L (ref 98–111)
Creatinine: 0.73 mg/dL (ref 0.44–1.00)
GFR, Est AFR Am: >60 mL/min (ref >60)
GFR, Estimated: >60 mL/min (ref >60)
Glucose, Bld: 93 mg/dL (ref 70–99)
Potassium: 5.1 mmol/L (ref 3.5–5.1)
Sodium: 136 mmol/L (ref 135–145)
Total Bilirubin: 0.2 mg/dL — ABNORMAL LOW (ref 0.3–1.2)
Total Protein: 6.2 g/dL — ABNORMAL LOW (ref 6.5–8.1)

## 2019-04-25 LAB — APTT: aPTT: 42 seconds — ABNORMAL HIGH (ref 24–36)

## 2019-04-25 LAB — PROTIME-INR
INR: 2.2 — ABNORMAL HIGH (ref 0.8–1.2)
Prothrombin Time: 24.4 seconds — ABNORMAL HIGH (ref 11.4–15.2)
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
## 2019-04-26 ENCOUNTER — Other Ambulatory Visit: Payer: Self-pay

## 2019-04-26 ENCOUNTER — Other Ambulatory Visit: Payer: Medicare Other

## 2019-04-29 ENCOUNTER — Other Ambulatory Visit: Payer: Self-pay | Admitting: *Deleted

## 2019-04-29 DIAGNOSIS — D6861 Antiphospholipid syndrome: Secondary | ICD-10-CM

## 2019-04-30 ENCOUNTER — Telehealth: Payer: Self-pay | Admitting: *Deleted

## 2019-04-30 ENCOUNTER — Inpatient Hospital Stay: Payer: Medicare Other | Attending: Oncology

## 2019-04-30 ENCOUNTER — Other Ambulatory Visit: Payer: Self-pay

## 2019-04-30 ENCOUNTER — Encounter: Payer: Self-pay | Admitting: *Deleted

## 2019-04-30 DIAGNOSIS — D6861 Antiphospholipid syndrome: Secondary | ICD-10-CM | POA: Diagnosis not present

## 2019-04-30 LAB — PROTIME-INR
INR: 1.9 — ABNORMAL HIGH (ref 0.8–1.2)
Prothrombin Time: 21.4 seconds — ABNORMAL HIGH (ref 11.4–15.2)

## 2019-04-30 NOTE — Telephone Encounter (Signed)
-----   Message from Sindy Guadeloupe, MD sent at 04/30/2019 12:53 PM EDT ----- Please ask her to take coumadin 7.5 mg 5 days a week and 5 mg 2 days a week. Repeat check on Friday. No lovenox. Thanks, Astrid Divine

## 2019-04-30 NOTE — Telephone Encounter (Signed)
Called pt and got vociemail, left message INR1.9. MD wants pt to take 7.5 mg 5 days a week and 5 mg 2 days a week. Today she does want 7.5 mg. Wants another lab check on Friday of this week. Made it for 11 am. Asked pt to call me to make sure she got the message and all above is good for her

## 2019-05-02 ENCOUNTER — Telehealth: Payer: Self-pay | Admitting: Neurology

## 2019-05-02 NOTE — Telephone Encounter (Signed)
I called to schedule the patient for their next Botox injection but they did not answer so I left a VM asking them to call us back.

## 2019-05-03 ENCOUNTER — Encounter: Payer: Self-pay | Admitting: Family

## 2019-05-03 ENCOUNTER — Other Ambulatory Visit: Payer: Self-pay

## 2019-05-03 ENCOUNTER — Other Ambulatory Visit: Payer: Self-pay | Admitting: *Deleted

## 2019-05-03 ENCOUNTER — Inpatient Hospital Stay: Payer: Medicare Other

## 2019-05-03 ENCOUNTER — Telehealth: Payer: Self-pay | Admitting: *Deleted

## 2019-05-03 DIAGNOSIS — D6861 Antiphospholipid syndrome: Secondary | ICD-10-CM

## 2019-05-03 LAB — PROTIME-INR
INR: 1.8 — ABNORMAL HIGH (ref 0.8–1.2)
Prothrombin Time: 20.6 seconds — ABNORMAL HIGH (ref 11.4–15.2)

## 2019-05-03 NOTE — Telephone Encounter (Signed)
-----   Message from Sindy Guadeloupe, MD sent at 05/03/2019 11:55 AM EDT ----- She needs to go back to 7.5 mg daily and check pt inr on tuesday

## 2019-05-03 NOTE — Telephone Encounter (Signed)
Called pt and got voicemail and told her that INR 1.8 , md wants her to take 7.5 mg every day. Recheck on tues 9/15 11:15. The pt. Did not have an appt made after I called her . I did apologize for the mistake and before I even called her . I put the appt in for the date above and I went out of her chart and back in to make sure it was in there. She can call me for any questions or issues

## 2019-05-07 ENCOUNTER — Other Ambulatory Visit: Payer: Self-pay

## 2019-05-07 ENCOUNTER — Telehealth: Payer: Self-pay | Admitting: *Deleted

## 2019-05-07 ENCOUNTER — Other Ambulatory Visit: Payer: Self-pay | Admitting: *Deleted

## 2019-05-07 ENCOUNTER — Inpatient Hospital Stay: Payer: Medicare Other

## 2019-05-07 DIAGNOSIS — Z7984 Long term (current) use of oral hypoglycemic drugs: Secondary | ICD-10-CM | POA: Diagnosis not present

## 2019-05-07 DIAGNOSIS — D6861 Antiphospholipid syndrome: Secondary | ICD-10-CM | POA: Diagnosis not present

## 2019-05-07 DIAGNOSIS — R112 Nausea with vomiting, unspecified: Secondary | ICD-10-CM | POA: Diagnosis not present

## 2019-05-07 DIAGNOSIS — R0602 Shortness of breath: Secondary | ICD-10-CM | POA: Diagnosis not present

## 2019-05-07 DIAGNOSIS — E119 Type 2 diabetes mellitus without complications: Secondary | ICD-10-CM | POA: Diagnosis not present

## 2019-05-07 DIAGNOSIS — Z87891 Personal history of nicotine dependence: Secondary | ICD-10-CM | POA: Diagnosis not present

## 2019-05-07 DIAGNOSIS — R42 Dizziness and giddiness: Secondary | ICD-10-CM | POA: Diagnosis not present

## 2019-05-07 DIAGNOSIS — Z79899 Other long term (current) drug therapy: Secondary | ICD-10-CM | POA: Diagnosis not present

## 2019-05-07 DIAGNOSIS — K3184 Gastroparesis: Secondary | ICD-10-CM | POA: Diagnosis not present

## 2019-05-07 DIAGNOSIS — E785 Hyperlipidemia, unspecified: Secondary | ICD-10-CM | POA: Diagnosis not present

## 2019-05-07 DIAGNOSIS — Z7901 Long term (current) use of anticoagulants: Secondary | ICD-10-CM | POA: Diagnosis not present

## 2019-05-07 DIAGNOSIS — Z86010 Personal history of colonic polyps: Secondary | ICD-10-CM | POA: Diagnosis not present

## 2019-05-07 DIAGNOSIS — Z86711 Personal history of pulmonary embolism: Secondary | ICD-10-CM | POA: Diagnosis not present

## 2019-05-07 DIAGNOSIS — K5900 Constipation, unspecified: Secondary | ICD-10-CM | POA: Diagnosis not present

## 2019-05-07 LAB — PROTIME-INR
INR: 3 — ABNORMAL HIGH (ref 0.8–1.2)
Prothrombin Time: 31 seconds — ABNORMAL HIGH (ref 11.4–15.2)

## 2019-05-07 NOTE — Telephone Encounter (Signed)
Called patient to let her know that her INR is 3.0 today.  Dr. wants patient to take 7.5 mg of Coumadin 6 days a week and 5 mg once a week.  We will need to recheck her Coumadin level in 1 week.  Made appointment for patient on 9/22 at 11 AM and patient agreeable to all above

## 2019-05-07 NOTE — Telephone Encounter (Signed)
-----   Message from Sindy Guadeloupe, MD sent at 05/07/2019  3:05 PM EDT ----- 7.5 mg 6 days a week and 5 mg once a week. Repeat inr check in 1 week. Thanks, Presenter, broadcasting

## 2019-05-14 ENCOUNTER — Inpatient Hospital Stay: Payer: Medicare Other

## 2019-05-15 ENCOUNTER — Other Ambulatory Visit: Payer: Self-pay

## 2019-05-15 ENCOUNTER — Inpatient Hospital Stay: Payer: Medicare Other

## 2019-05-15 ENCOUNTER — Telehealth: Payer: Self-pay | Admitting: *Deleted

## 2019-05-15 DIAGNOSIS — D6861 Antiphospholipid syndrome: Secondary | ICD-10-CM

## 2019-05-15 DIAGNOSIS — Z79891 Long term (current) use of opiate analgesic: Secondary | ICD-10-CM | POA: Diagnosis not present

## 2019-05-15 DIAGNOSIS — G890 Central pain syndrome: Secondary | ICD-10-CM | POA: Diagnosis not present

## 2019-05-15 DIAGNOSIS — R110 Nausea: Secondary | ICD-10-CM | POA: Diagnosis not present

## 2019-05-15 DIAGNOSIS — G894 Chronic pain syndrome: Secondary | ICD-10-CM | POA: Diagnosis not present

## 2019-05-15 LAB — PROTIME-INR
INR: 2.3 — ABNORMAL HIGH (ref 0.8–1.2)
Prothrombin Time: 24.9 seconds — ABNORMAL HIGH (ref 11.4–15.2)

## 2019-05-15 NOTE — Telephone Encounter (Signed)
Called pt and left message that INR 2.3 today-good number and she should stay on same dose 7.5 mg daily for 6 days and 1 day aweek to take 5 mg. If she would let me know what day next week to check labs again . I would thinkg wither wed or Thursday. Left my number to call me back or sent my chart message

## 2019-05-16 ENCOUNTER — Other Ambulatory Visit: Payer: Self-pay | Admitting: *Deleted

## 2019-05-16 ENCOUNTER — Encounter: Payer: Self-pay | Admitting: *Deleted

## 2019-05-16 DIAGNOSIS — D6861 Antiphospholipid syndrome: Secondary | ICD-10-CM

## 2019-05-19 LAB — PROTIME-INR
INR: 2.7 — AB (ref 0.9–1.1)
INR: 2.7 — AB (ref 0.9–1.1)

## 2019-05-21 ENCOUNTER — Other Ambulatory Visit: Payer: Self-pay | Admitting: *Deleted

## 2019-05-21 ENCOUNTER — Other Ambulatory Visit: Payer: Self-pay

## 2019-05-21 ENCOUNTER — Inpatient Hospital Stay: Payer: Medicare Other

## 2019-05-21 DIAGNOSIS — D6861 Antiphospholipid syndrome: Secondary | ICD-10-CM

## 2019-05-21 LAB — PROTIME-INR
INR: 2.7 — ABNORMAL HIGH (ref 0.8–1.2)
Prothrombin Time: 28.2 seconds — ABNORMAL HIGH (ref 11.4–15.2)

## 2019-05-21 NOTE — Progress Notes (Signed)
Called pt and let her know that INR 2.7. continue on 7.5 mg daily for 6 days and 1 day 5 mg. She has appt 10/12 already scheduled for lab and see md. Pt will come to that appt.

## 2019-05-23 ENCOUNTER — Other Ambulatory Visit: Payer: Self-pay | Admitting: Neurology

## 2019-05-23 ENCOUNTER — Other Ambulatory Visit: Payer: Self-pay | Admitting: Family Medicine

## 2019-05-23 ENCOUNTER — Other Ambulatory Visit: Payer: Self-pay | Admitting: *Deleted

## 2019-05-23 DIAGNOSIS — G43711 Chronic migraine without aura, intractable, with status migrainosus: Secondary | ICD-10-CM

## 2019-05-23 MED ORDER — WARFARIN SODIUM 5 MG PO TABS: 5.0000 mg | ORAL_TABLET | ORAL | 1 refills | Status: DC

## 2019-05-24 ENCOUNTER — Other Ambulatory Visit: Payer: Self-pay | Admitting: Internal Medicine

## 2019-05-24 DIAGNOSIS — G4700 Insomnia, unspecified: Secondary | ICD-10-CM

## 2019-05-24 MED ORDER — TRAZODONE HCL 100 MG PO TABS: 100.0000 mg | ORAL_TABLET | Freq: Every evening | ORAL | 3 refills | Status: DC | PRN

## 2019-05-31 ENCOUNTER — Encounter: Payer: Self-pay | Admitting: Internal Medicine

## 2019-06-04 ENCOUNTER — Inpatient Hospital Stay: Payer: Medicare Other

## 2019-06-04 ENCOUNTER — Other Ambulatory Visit: Payer: Self-pay

## 2019-06-04 ENCOUNTER — Inpatient Hospital Stay: Payer: Medicare Other | Attending: Oncology | Admitting: Oncology

## 2019-06-04 ENCOUNTER — Encounter: Payer: Self-pay | Admitting: Oncology

## 2019-06-04 ENCOUNTER — Encounter: Payer: Self-pay | Admitting: *Deleted

## 2019-06-04 VITALS — BP 106/72 | HR 88 | Temp 96.7°F | Resp 16 | Wt 207.5 lb

## 2019-06-04 DIAGNOSIS — E119 Type 2 diabetes mellitus without complications: Secondary | ICD-10-CM | POA: Insufficient documentation

## 2019-06-04 DIAGNOSIS — R0781 Pleurodynia: Secondary | ICD-10-CM | POA: Insufficient documentation

## 2019-06-04 DIAGNOSIS — Z5181 Encounter for therapeutic drug level monitoring: Secondary | ICD-10-CM | POA: Diagnosis not present

## 2019-06-04 DIAGNOSIS — D6861 Antiphospholipid syndrome: Secondary | ICD-10-CM

## 2019-06-04 DIAGNOSIS — G894 Chronic pain syndrome: Secondary | ICD-10-CM | POA: Diagnosis not present

## 2019-06-04 DIAGNOSIS — Z7984 Long term (current) use of oral hypoglycemic drugs: Secondary | ICD-10-CM | POA: Insufficient documentation

## 2019-06-04 DIAGNOSIS — I2699 Other pulmonary embolism without acute cor pulmonale: Secondary | ICD-10-CM | POA: Diagnosis not present

## 2019-06-04 DIAGNOSIS — F17210 Nicotine dependence, cigarettes, uncomplicated: Secondary | ICD-10-CM | POA: Insufficient documentation

## 2019-06-04 DIAGNOSIS — Z7901 Long term (current) use of anticoagulants: Secondary | ICD-10-CM | POA: Insufficient documentation

## 2019-06-04 DIAGNOSIS — D6862 Lupus anticoagulant syndrome: Secondary | ICD-10-CM | POA: Diagnosis not present

## 2019-06-04 DIAGNOSIS — Z87891 Personal history of nicotine dependence: Secondary | ICD-10-CM | POA: Insufficient documentation

## 2019-06-04 DIAGNOSIS — F329 Major depressive disorder, single episode, unspecified: Secondary | ICD-10-CM | POA: Insufficient documentation

## 2019-06-04 DIAGNOSIS — R634 Abnormal weight loss: Secondary | ICD-10-CM | POA: Diagnosis not present

## 2019-06-04 DIAGNOSIS — E785 Hyperlipidemia, unspecified: Secondary | ICD-10-CM | POA: Insufficient documentation

## 2019-06-04 DIAGNOSIS — E669 Obesity, unspecified: Secondary | ICD-10-CM | POA: Insufficient documentation

## 2019-06-04 DIAGNOSIS — Z86010 Personal history of colonic polyps: Secondary | ICD-10-CM | POA: Insufficient documentation

## 2019-06-04 DIAGNOSIS — M25561 Pain in right knee: Secondary | ICD-10-CM | POA: Diagnosis not present

## 2019-06-04 DIAGNOSIS — Z86711 Personal history of pulmonary embolism: Secondary | ICD-10-CM | POA: Diagnosis not present

## 2019-06-04 DIAGNOSIS — Z79899 Other long term (current) drug therapy: Secondary | ICD-10-CM | POA: Insufficient documentation

## 2019-06-04 LAB — CBC WITH DIFFERENTIAL/PLATELET
Abs Immature Granulocytes: 0.01 10*3/uL (ref 0.00–0.07)
Basophils Absolute: 0 10*3/uL (ref 0.0–0.1)
Basophils Relative: 0 %
Eosinophils Absolute: 0.2 10*3/uL (ref 0.0–0.5)
Eosinophils Relative: 4 %
HCT: 40.3 % (ref 36.0–46.0)
Hemoglobin: 13.2 g/dL (ref 12.0–15.0)
Immature Granulocytes: 0 %
Lymphocytes Relative: 52 %
Lymphs Abs: 2.9 10*3/uL (ref 0.7–4.0)
MCH: 28.7 pg (ref 26.0–34.0)
MCHC: 32.8 g/dL (ref 30.0–36.0)
MCV: 87.6 fL (ref 80.0–100.0)
Monocytes Absolute: 0.3 10*3/uL (ref 0.1–1.0)
Monocytes Relative: 6 %
Neutro Abs: 2.2 10*3/uL (ref 1.7–7.7)
Neutrophils Relative %: 38 %
Platelets: 247 10*3/uL (ref 150–400)
RBC: 4.6 MIL/uL (ref 3.87–5.11)
RDW: 12.7 % (ref 11.5–15.5)
WBC: 5.7 10*3/uL (ref 4.0–10.5)
nRBC: 0 % (ref 0.0–0.2)

## 2019-06-04 LAB — COMPREHENSIVE METABOLIC PANEL
ALT: 24 U/L (ref 0–44)
AST: 28 U/L (ref 15–41)
Albumin: 4.3 g/dL (ref 3.5–5.0)
Alkaline Phosphatase: 62 U/L (ref 38–126)
Anion gap: 10 (ref 5–15)
BUN: 17 mg/dL (ref 6–20)
CO2: 24 mmol/L (ref 22–32)
Calcium: 9.3 mg/dL (ref 8.9–10.3)
Chloride: 104 mmol/L (ref 98–111)
Creatinine, Ser: 0.86 mg/dL (ref 0.44–1.00)
GFR calc Af Amer: >60 mL/min (ref >60)
GFR calc non Af Amer: >60 mL/min (ref >60)
Glucose, Bld: 77 mg/dL (ref 70–99)
Potassium: 4.5 mmol/L (ref 3.5–5.1)
Sodium: 138 mmol/L (ref 135–145)
Total Bilirubin: 0.3 mg/dL (ref 0.3–1.2)
Total Protein: 7.4 g/dL (ref 6.5–8.1)

## 2019-06-04 LAB — PROTIME-INR
INR: 2.4 — ABNORMAL HIGH (ref 0.8–1.2)
Prothrombin Time: 25.6 seconds — ABNORMAL HIGH (ref 11.4–15.2)

## 2019-06-07 ENCOUNTER — Encounter: Payer: Self-pay | Admitting: Oncology

## 2019-06-07 ENCOUNTER — Encounter: Payer: Self-pay | Admitting: *Deleted
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
## 2019-06-12 ENCOUNTER — Telehealth: Payer: Self-pay

## 2019-06-12 ENCOUNTER — Other Ambulatory Visit: Payer: Self-pay | Admitting: Internal Medicine

## 2019-06-12 NOTE — Telephone Encounter (Signed)
Patient called in stating her medicine doesn't have anymore refills and I seen where Dr. Cruzita Lederer sent it today   Please advise

## 2019-06-13 DIAGNOSIS — G894 Chronic pain syndrome: Secondary | ICD-10-CM | POA: Diagnosis not present

## 2019-06-13 DIAGNOSIS — Z79891 Long term (current) use of opiate analgesic: Secondary | ICD-10-CM | POA: Diagnosis not present

## 2019-06-13 DIAGNOSIS — R110 Nausea: Secondary | ICD-10-CM | POA: Diagnosis not present

## 2019-06-13 DIAGNOSIS — G890 Central pain syndrome: Secondary | ICD-10-CM | POA: Diagnosis not present

## 2019-06-13 NOTE — Telephone Encounter (Signed)
Yes it was sent to the pharmacy, please direct patient to contact her pharmacy.  TRULICITY 1.5 0000000 SOPN 14 mL 1 06/12/2019    Sig: INJECT 1.5MG  INTO THE SKIN ONCE A WEEK   Sent to pharmacy as: TRULICITY 1.5 0000000 Solution Pen-injector   E-Prescribing Status: Receipt confirmed by pharmacy (06/12/2019 1:08 PM EDT)

## 2019-06-14 ENCOUNTER — Other Ambulatory Visit: Payer: Self-pay

## 2019-06-14 ENCOUNTER — Ambulatory Visit
Admission: RE | Admit: 2019-06-14 | Discharge: 2019-06-14 | Disposition: A | Discharge status: Home / Self Care | Payer: Medicare Other | Source: Ambulatory Visit | Attending: Oncology | Admitting: Oncology

## 2019-06-14 DIAGNOSIS — D6861 Antiphospholipid syndrome: Secondary | ICD-10-CM | POA: Diagnosis not present

## 2019-06-14 DIAGNOSIS — E785 Hyperlipidemia, unspecified: Secondary | ICD-10-CM | POA: Diagnosis not present

## 2019-06-14 DIAGNOSIS — R0602 Shortness of breath: Secondary | ICD-10-CM | POA: Diagnosis not present

## 2019-06-14 DIAGNOSIS — M25561 Pain in right knee: Secondary | ICD-10-CM | POA: Diagnosis not present

## 2019-06-14 DIAGNOSIS — I519 Heart disease, unspecified: Secondary | ICD-10-CM | POA: Diagnosis not present

## 2019-06-14 DIAGNOSIS — Z86711 Personal history of pulmonary embolism: Secondary | ICD-10-CM | POA: Diagnosis not present

## 2019-06-14 NOTE — Progress Notes (Signed)
*  PRELIMINARY RESULTS* Echocardiogram 2D Echocardiogram has been performed.  Amanda Vasquez 06/14/2019, 11:47 AM

## 2019-06-17 ENCOUNTER — Other Ambulatory Visit: Payer: Self-pay

## 2019-06-18 ENCOUNTER — Telehealth: Payer: Self-pay | Admitting: *Deleted

## 2019-06-18 ENCOUNTER — Other Ambulatory Visit: Payer: Self-pay

## 2019-06-18 ENCOUNTER — Ambulatory Visit: Payer: Medicare Other | Admitting: Internal Medicine

## 2019-06-18 ENCOUNTER — Inpatient Hospital Stay: Payer: Medicare Other

## 2019-06-18 DIAGNOSIS — Z7901 Long term (current) use of anticoagulants: Secondary | ICD-10-CM | POA: Diagnosis not present

## 2019-06-18 DIAGNOSIS — Z7984 Long term (current) use of oral hypoglycemic drugs: Secondary | ICD-10-CM | POA: Diagnosis not present

## 2019-06-18 DIAGNOSIS — I2699 Other pulmonary embolism without acute cor pulmonale: Secondary | ICD-10-CM | POA: Diagnosis not present

## 2019-06-18 DIAGNOSIS — Z86010 Personal history of colonic polyps: Secondary | ICD-10-CM | POA: Diagnosis not present

## 2019-06-18 DIAGNOSIS — D6861 Antiphospholipid syndrome: Secondary | ICD-10-CM

## 2019-06-18 DIAGNOSIS — Z79899 Other long term (current) drug therapy: Secondary | ICD-10-CM | POA: Diagnosis not present

## 2019-06-18 DIAGNOSIS — D6862 Lupus anticoagulant syndrome: Secondary | ICD-10-CM | POA: Diagnosis not present

## 2019-06-18 DIAGNOSIS — E119 Type 2 diabetes mellitus without complications: Secondary | ICD-10-CM | POA: Diagnosis not present

## 2019-06-18 DIAGNOSIS — E785 Hyperlipidemia, unspecified: Secondary | ICD-10-CM | POA: Diagnosis not present

## 2019-06-18 DIAGNOSIS — R0781 Pleurodynia: Secondary | ICD-10-CM | POA: Diagnosis not present

## 2019-06-18 DIAGNOSIS — Z86711 Personal history of pulmonary embolism: Secondary | ICD-10-CM | POA: Diagnosis not present

## 2019-06-18 DIAGNOSIS — Z87891 Personal history of nicotine dependence: Secondary | ICD-10-CM | POA: Diagnosis not present

## 2019-06-18 DIAGNOSIS — M25561 Pain in right knee: Secondary | ICD-10-CM | POA: Diagnosis not present

## 2019-06-18 DIAGNOSIS — S83241A Other tear of medial meniscus, current injury, right knee, initial encounter: Secondary | ICD-10-CM | POA: Diagnosis not present

## 2019-06-18 DIAGNOSIS — G894 Chronic pain syndrome: Secondary | ICD-10-CM | POA: Diagnosis not present

## 2019-06-18 LAB — PROTIME-INR
INR: 1.6 — ABNORMAL HIGH (ref 0.8–1.2)
Prothrombin Time: 19.1 seconds — ABNORMAL HIGH (ref 11.4–15.2)

## 2019-06-18 NOTE — Telephone Encounter (Signed)
-----   Message from Sindy Guadeloupe, MD sent at 06/18/2019  2:17 PM EDT ----- Increase coumadin to 7.5 mg 6 days a week. Thanks, Astrid Divine

## 2019-06-18 NOTE — Telephone Encounter (Signed)
Called patient today to let her know INR was 1.6 today.  I asked patient if she has missed any doses and she says no she has been taking the doses every day like she should.  She has been taking 7.5 mg 6 days a week and then 1 day a week 5 mg.  So based on that she should be taking 7.5 mg every day.  She will start taking it every day of 7.5.  Also asked if she has been eating more green leafy vegetables and she says know what ever she eats she is trying to stay the same way every single week.  Patient already scheduled for 2 weeks from now for her next PT/INR.

## 2019-06-19 DIAGNOSIS — S83241D Other tear of medial meniscus, current injury, right knee, subsequent encounter: Secondary | ICD-10-CM | POA: Diagnosis not present

## 2019-06-21 ENCOUNTER — Other Ambulatory Visit: Payer: Self-pay | Admitting: Oncology

## 2019-06-21 ENCOUNTER — Other Ambulatory Visit: Payer: Self-pay | Admitting: Internal Medicine

## 2019-06-23 ENCOUNTER — Encounter: Payer: Self-pay | Admitting: Emergency Medicine

## 2019-06-23 ENCOUNTER — Emergency Department
Admission: EM | Admit: 2019-06-23 | Discharge: 2019-06-23 | Disposition: A | Discharge status: Home / Self Care | Payer: Medicare Other | Attending: Emergency Medicine | Admitting: Emergency Medicine

## 2019-06-23 ENCOUNTER — Other Ambulatory Visit: Payer: Self-pay

## 2019-06-23 DIAGNOSIS — M25561 Pain in right knee: Secondary | ICD-10-CM | POA: Insufficient documentation

## 2019-06-23 DIAGNOSIS — Z87891 Personal history of nicotine dependence: Secondary | ICD-10-CM | POA: Diagnosis not present

## 2019-06-23 DIAGNOSIS — Z7901 Long term (current) use of anticoagulants: Secondary | ICD-10-CM | POA: Diagnosis not present

## 2019-06-23 DIAGNOSIS — E119 Type 2 diabetes mellitus without complications: Secondary | ICD-10-CM | POA: Diagnosis not present

## 2019-06-23 DIAGNOSIS — Z7984 Long term (current) use of oral hypoglycemic drugs: Secondary | ICD-10-CM | POA: Insufficient documentation

## 2019-06-23 DIAGNOSIS — Z79899 Other long term (current) drug therapy: Secondary | ICD-10-CM | POA: Insufficient documentation

## 2019-06-23 NOTE — Discharge Instructions (Signed)
Follow-up with your orthopedist in [place].  Continue taking your medication as previously prescribed.  You may ice and elevate as needed for pain control.  Wear the knee immobilizer for added support and protection.  Call your orthopedist on Monday for further instructions.
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
## 2019-06-23 NOTE — ED Notes (Signed)
See triage note  Presents with increased pain to right knee   S/p fall  States she see [facility] and has a tear  States having increased pain

## 2019-06-23 NOTE — ED Triage Notes (Signed)
Pt to ED via POV, pt states that she has torn meniscus in her right right. Pt states that yesterday she re-injured her knee. Pt states that she heard a loud "pop" and is now having increased pain. Pt states that she was unable to sleep last night due to the pain. Pt is in NAD.

## 2019-06-24 ENCOUNTER — Encounter: Payer: Self-pay | Admitting: Internal Medicine

## 2019-06-26 DIAGNOSIS — G8929 Other chronic pain: Secondary | ICD-10-CM | POA: Diagnosis not present

## 2019-06-26 DIAGNOSIS — M1711 Unilateral primary osteoarthritis, right knee: Secondary | ICD-10-CM | POA: Diagnosis not present

## 2019-06-26 DIAGNOSIS — M25561 Pain in right knee: Secondary | ICD-10-CM | POA: Diagnosis not present

## 2019-06-28 ENCOUNTER — Telehealth: Payer: Self-pay | Admitting: Internal Medicine

## 2019-06-28 NOTE — Telephone Encounter (Signed)
Medical record request. We have already forwarded the paperwork. Also she asked for billing department number. Which was given.

## 2019-06-28 NOTE — Telephone Encounter (Signed)
I have not had a chance to see if paperwork received in the last 2 days  Paperwork comes with a fee and takes 1-2 business weeks  Let pt and abby know   What is the paperwork for?   [place]

## 2019-06-28 NOTE — Telephone Encounter (Signed)
Amanda Vasquez with [employer] [facility] called and would like a call back from [place] regarding paperwork she send to him and would like to know if he received it. Please call her back, thanks.

## 2019-07-02 ENCOUNTER — Telehealth: Payer: Self-pay

## 2019-07-02 ENCOUNTER — Inpatient Hospital Stay: Payer: Medicare Other | Attending: Oncology

## 2019-07-02 DIAGNOSIS — D6861 Antiphospholipid syndrome: Secondary | ICD-10-CM | POA: Insufficient documentation

## 2019-07-02 NOTE — Telephone Encounter (Signed)
I called the patients insurance to check coverage and pre cert requirements for Botox injections in office for this patient. I spoke with Wallie Char. who stated that it is covered and NPR Ref# [id number]

## 2019-07-08 NOTE — Progress Notes (Signed)
 Consent Form Botulism Toxin Injection For Chronic Migraine   Interval history 07/09/2019: Continues to do extremely well > 75% decrease freq headaches. +n.  Reviewed orally with patient, additionally signature is on file:  Botulism toxin has been approved by the Federal drug administration for treatment of chronic migraine. Botulism toxin does not cure chronic migraine and it may not be effective in some patients.  The administration of botulism toxin is accomplished by injecting a small amount of toxin into the muscles of the neck and head. Dosage must be titrated for each individual. Any benefits resulting from botulism toxin tend to wear off after 3 months with a repeat injection required if benefit is to be maintained. Injections are usually done every 3-4 months with maximum effect peak achieved by about 2 or 3 weeks. Botulism toxin is expensive and you should be sure of what costs you will incur resulting from the injection.  The side effects of botulism toxin use for chronic migraine may include:   -Transient, and usually mild, facial weakness with facial injections  -Transient, and usually mild, head or neck weakness with head/neck injections  -Reduction or loss of forehead facial animation due to forehead muscle weakness  -Eyelid drooping  -Dry eye  -Pain at the site of injection or bruising at the site of injection  -Double vision  -Potential unknown long term risks  Contraindications: You should not have Botox if you are pregnant, nursing, allergic to albumin, have an infection, skin condition, or muscle weakness at the site of the injection, or have myasthenia gravis, Lambert-Eaton syndrome, or ALS.  It is also possible that as with any injection, there may be an allergic reaction or no effect from the medication. Reduced effectiveness after repeated injections is sometimes seen and rarely infection at the injection site may occur. All care will be taken to prevent these side  effects. If therapy is given over a long time, atrophy and wasting in the muscle injected may occur. Occasionally the patient's become refractory to treatment because they develop antibodies to the toxin. In this event, therapy needs to be modified.  I have read the above information and consent to the administration of botulism toxin.    BOTOX PROCEDURE NOTE FOR MIGRAINE HEADACHE    Contraindications and precautions discussed with patient(above). Aseptic procedure was observed and patient tolerated procedure. Procedure performed by Dr. Toni Ahern  The condition has existed for more than 6 months, and pt does not have a diagnosis of ALS, Myasthenia Gravis or Lambert-Eaton Syndrome.  Risks and benefits of injections discussed and pt agrees to proceed with the procedure.  Written consent obtained  These injections are medically necessary. Pt  receives good benefits from these injections. These injections do not cause sedations or hallucinations which the oral therapies may cause.  Description of procedure:  The patient was placed in a sitting position. The standard protocol was used for Botox as follows, with 5 units of Botox injected at each site:   -Procerus muscle, midline injection  -Corrugator muscle, bilateral injection  -Frontalis muscle, bilateral injection, with 2 sites each side, medial injection was performed in the upper one third of the frontalis muscle, in the region vertical from the medial inferior edge of the superior orbital rim. The lateral injection was again in the upper one third of the forehead vertically above the lateral limbus of the cornea, 1.5 cm lateral to the medial injection site.    -Temporalis muscle injection, 5 sites, bilaterally. The first injection   was 3 cm above the tragus of the ear, second injection site was 1.5 cm to 3 cm up from the first injection site in line with the tragus of the ear. The third injection site was 1.5-3 cm forward between the  first 2 injection sites. The fourth injection site was 1.5 cm posterior to the second injection site.   -Occipitalis muscle injection, 3 sites, bilaterally. The first injection was done one half way between the occipital protuberance and the tip of the mastoid process behind the ear. The second injection site was done lateral and superior to the first, 1 fingerbreadth from the first injection. The third injection site was 1 fingerbreadth superiorly and medially from the first injection site.  -Cervical paraspinal muscle injection, 2 sites, bilateral knee first injection site was 1 cm from the midline of the cervical spine, 3 cm inferior to the lower border of the occipital protuberance. The second injection site was 1.5 cm superiorly and laterally to the first injection site.  -Trapezius muscle injection was performed at 3 sites, bilaterally. The first injection site was in the upper trapezius muscle halfway between the inflection point of the neck, and the acromion. The second injection site was one half way between the acromion and the first injection site. The third injection was done between the first injection site and the inflection point of the neck.   Will return for repeat injection in 3 months.   A 200 unit sof Botox was used, any Botox not injected was wasted. The patient tolerated the procedure well, there were no complications of the above procedure.  Performed by Dr. Jaynee Eagles M.D. 72ml Lidocaine 1%, 30-gauge needle was used. All procedures a documented were medically necessary, reasonable and appropriate based on the patient's history, medical diagnosis and physician opinion. Verbal informed consent was obtained from the patient, patient was informed of potential risk of procedure, including bruising, bleeding, hematoma formation, infection, muscle weakness, muscle pain, numbness, transient hypertension, transient hyperglycemia and transient insomnia among others. All areas injected were  topically clean with isopropyl rubbing alcohol. Nonsterile nonlatex gloves were worn during the procedure.  Left pectoralis 3 injection sites based on palpation and pain. Used emg machine to target the muscle. Patient tolerated well no side effects. Felt better after procedure. Called her this evening, no side effects.

## 2019-07-09 ENCOUNTER — Ambulatory Visit: Payer: Medicare Other | Admitting: Neurology

## 2019-07-09 ENCOUNTER — Other Ambulatory Visit: Payer: Self-pay

## 2019-07-09 VITALS — Temp 97.1°F

## 2019-07-09 DIAGNOSIS — G43711 Chronic migraine without aura, intractable, with status migrainosus: Secondary | ICD-10-CM

## 2019-07-09 NOTE — Progress Notes (Signed)
Botox- 200 units x 1 vial Lot: [id number] Expiration: 03/2022 NDC: [id number]  Bacteriostatic 0.9% Sodium Chloride- 18mL total Lot: [id number] Expiration: 02/20/2020 NDC: [id number]  Dx: [id number] B/B

## 2019-07-10 DIAGNOSIS — G894 Chronic pain syndrome: Secondary | ICD-10-CM | POA: Diagnosis not present

## 2019-07-10 DIAGNOSIS — R110 Nausea: Secondary | ICD-10-CM | POA: Diagnosis not present

## 2019-07-10 DIAGNOSIS — Z79891 Long term (current) use of opiate analgesic: Secondary | ICD-10-CM | POA: Diagnosis not present

## 2019-07-10 DIAGNOSIS — G890 Central pain syndrome: Secondary | ICD-10-CM | POA: Diagnosis not present

## 2019-07-16 ENCOUNTER — Other Ambulatory Visit: Payer: Self-pay

## 2019-07-16 ENCOUNTER — Inpatient Hospital Stay: Payer: Medicare Other

## 2019-07-16 DIAGNOSIS — D6861 Antiphospholipid syndrome: Secondary | ICD-10-CM | POA: Diagnosis not present

## 2019-07-16 LAB — PROTIME-INR
INR: 1.4 — ABNORMAL HIGH (ref 0.8–1.2)
Prothrombin Time: 16.9 seconds — ABNORMAL HIGH (ref 11.4–15.2)

## 2019-07-17 ENCOUNTER — Telehealth: Payer: Self-pay | Admitting: *Deleted

## 2019-07-17 ENCOUNTER — Other Ambulatory Visit: Payer: Self-pay | Admitting: *Deleted

## 2019-07-17 DIAGNOSIS — D6861 Antiphospholipid syndrome: Secondary | ICD-10-CM

## 2019-07-17 MED ORDER — WARFARIN SODIUM 5 MG PO TABS: 5.0000 mg | ORAL_TABLET | ORAL | 1 refills | Status: DC

## 2019-07-17 NOTE — Telephone Encounter (Signed)
Called pt and left a message that INR 1.4 and wants to know if pt has missed any doses. Is she still on 7.g mg every day. Did she eat more greens than she has usually done. Gave her my number to call me back

## 2019-07-22 ENCOUNTER — Inpatient Hospital Stay: Payer: Medicare Other

## 2019-07-22 ENCOUNTER — Telehealth: Payer: Self-pay | Admitting: *Deleted

## 2019-07-22 ENCOUNTER — Other Ambulatory Visit: Payer: Self-pay | Admitting: *Deleted

## 2019-07-22 ENCOUNTER — Other Ambulatory Visit: Payer: Self-pay

## 2019-07-22 DIAGNOSIS — D6861 Antiphospholipid syndrome: Secondary | ICD-10-CM | POA: Diagnosis not present

## 2019-07-22 LAB — PROTIME-INR
INR: 2 — ABNORMAL HIGH (ref 0.8–1.2)
Prothrombin Time: 22.5 seconds — ABNORMAL HIGH (ref 11.4–15.2)

## 2019-07-22 NOTE — Telephone Encounter (Signed)
Called pt and let her know to stay on the same dose of coumadin. INR is 2.0 she would like pt to have labs checked again on Friday and pt agreeable. Will come at 11:30

## 2019-07-22 NOTE — Telephone Encounter (Signed)
-----   Message from Sindy Guadeloupe, MD sent at 07/22/2019 12:52 PM EST ----- Keep same dose but check friday

## 2019-07-23 ENCOUNTER — Ambulatory Visit (INDEPENDENT_AMBULATORY_CARE_PROVIDER_SITE_OTHER): Payer: Medicare Other | Admitting: Internal Medicine

## 2019-07-23 ENCOUNTER — Encounter: Payer: Self-pay | Admitting: Internal Medicine

## 2019-07-23 DIAGNOSIS — E1165 Type 2 diabetes mellitus with hyperglycemia: Secondary | ICD-10-CM | POA: Diagnosis not present

## 2019-07-23 DIAGNOSIS — E785 Hyperlipidemia, unspecified: Secondary | ICD-10-CM | POA: Diagnosis not present

## 2019-07-23 DIAGNOSIS — Z6836 Body mass index (BMI) 36.0-36.9, adult: Secondary | ICD-10-CM | POA: Diagnosis not present
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
## 2019-07-23 NOTE — Patient Instructions (Addendum)
Please continue: - Metformin 1000 mg 2x a day, with meals - Jardiance 25 mg daily in a.m.  Please try to stop Trulicity after the holidays.  Let me know if the sugars start increasing afterwards.  Please return in 4 months with your sugar log.

## 2019-07-26 ENCOUNTER — Telehealth: Payer: Self-pay | Admitting: *Deleted

## 2019-07-26 ENCOUNTER — Inpatient Hospital Stay: Payer: Medicare Other | Attending: Oncology

## 2019-07-26 ENCOUNTER — Other Ambulatory Visit: Payer: Self-pay

## 2019-07-26 DIAGNOSIS — D6861 Antiphospholipid syndrome: Secondary | ICD-10-CM | POA: Diagnosis not present

## 2019-07-26 LAB — PROTIME-INR
INR: 2 — ABNORMAL HIGH (ref 0.8–1.2)
Prothrombin Time: 22.7 seconds — ABNORMAL HIGH (ref 11.4–15.2)

## 2019-07-26 NOTE — Telephone Encounter (Signed)
Called Amanda Vasquez and let her know that level was 2.0 INR. She is glad that it did not come down but she would feel better if she was 2.2.  She is currently taking coumadin10 mg 4 times a week and 7.5 mg 3 days a week. Dr. Janese Banks would like her to have 10 mg 5 times a week and 7.5   2 days a week and recheck in 1 week for labs.. Amanda Vasquez agreeable for 11:15 and she will increase dose of coumadin as described above.

## 2019-07-30 ENCOUNTER — Inpatient Hospital Stay: Payer: Medicare Other

## 2019-08-01 ENCOUNTER — Encounter: Payer: Self-pay | Admitting: Internal Medicine

## 2019-08-01 ENCOUNTER — Ambulatory Visit: Payer: Medicare Other

## 2019-08-01 ENCOUNTER — Ambulatory Visit (INDEPENDENT_AMBULATORY_CARE_PROVIDER_SITE_OTHER): Payer: Medicare Other | Admitting: Internal Medicine

## 2019-08-01 VITALS — Ht 65.0 in | Wt 189.0 lb

## 2019-08-01 DIAGNOSIS — G4700 Insomnia, unspecified: Secondary | ICD-10-CM

## 2019-08-01 DIAGNOSIS — R300 Dysuria: Secondary | ICD-10-CM

## 2019-08-01 DIAGNOSIS — F329 Major depressive disorder, single episode, unspecified: Secondary | ICD-10-CM

## 2019-08-01 DIAGNOSIS — S83206A Unspecified tear of unspecified meniscus, current injury, right knee, initial encounter: Secondary | ICD-10-CM | POA: Insufficient documentation

## 2019-08-01 DIAGNOSIS — E785 Hyperlipidemia, unspecified: Secondary | ICD-10-CM

## 2019-08-01 DIAGNOSIS — E1165 Type 2 diabetes mellitus with hyperglycemia: Secondary | ICD-10-CM

## 2019-08-01 DIAGNOSIS — F419 Anxiety disorder, unspecified: Secondary | ICD-10-CM | POA: Diagnosis not present

## 2019-08-01 DIAGNOSIS — F32A Depression, unspecified: Secondary | ICD-10-CM

## 2019-08-01 DIAGNOSIS — I2609 Other pulmonary embolism with acute cor pulmonale: Secondary | ICD-10-CM

## 2019-08-01 DIAGNOSIS — I2782 Chronic pulmonary embolism: Secondary | ICD-10-CM

## 2019-08-01 DIAGNOSIS — D6861 Antiphospholipid syndrome: Secondary | ICD-10-CM | POA: Diagnosis not present

## 2019-08-01 DIAGNOSIS — Z6831 Body mass index (BMI) 31.0-31.9, adult: Secondary | ICD-10-CM

## 2019-08-01 DIAGNOSIS — S83206D Unspecified tear of unspecified meniscus, current injury, right knee, subsequent encounter: Secondary | ICD-10-CM

## 2019-08-01 DIAGNOSIS — Z1329 Encounter for screening for other suspected endocrine disorder: Secondary | ICD-10-CM

## 2019-08-01 DIAGNOSIS — E669 Obesity, unspecified: Secondary | ICD-10-CM

## 2019-08-01 MED ORDER — TRAZODONE HCL 150 MG PO TABS: 150.0000 mg | ORAL_TABLET | Freq: Every evening | ORAL | 3 refills | Status: DC | PRN
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
## 2019-08-02 ENCOUNTER — Other Ambulatory Visit: Payer: Self-pay

## 2019-08-02 ENCOUNTER — Telehealth: Payer: Self-pay

## 2019-08-02 ENCOUNTER — Inpatient Hospital Stay: Payer: Medicare Other

## 2019-08-02 DIAGNOSIS — D6861 Antiphospholipid syndrome: Secondary | ICD-10-CM

## 2019-08-02 LAB — PROTIME-INR
INR: 2.3 — ABNORMAL HIGH (ref 0.8–1.2)
Prothrombin Time: 24.9 seconds — ABNORMAL HIGH (ref 11.4–15.2)

## 2019-08-02 NOTE — Telephone Encounter (Signed)
Called patient to let her know that Dr. Janese Banks recommends for her to stay on the same dosage of Coumadin. Patient agreed and had no further questions. I reminded patient that she had a lab appointment scheduled on 08/13/2019.

## 2019-08-02 NOTE — Telephone Encounter (Signed)
-----   Message from Luella Cook, RN sent at 08/02/2019  3:27 PM EST -----  ----- Message ----- From: Sindy Guadeloupe, MD Sent: 08/02/2019   1:48 PM EST To: Luella Cook, RN  Stay at current dose of cumadin

## 2019-08-05 ENCOUNTER — Encounter: Payer: Self-pay | Admitting: Internal Medicine

## 2019-08-05 MED ORDER — BUPROPION HCL ER (XL) 300 MG PO TB24: ORAL_TABLET | ORAL | 3 refills | Status: DC

## 2019-08-05 MED ORDER — ATORVASTATIN CALCIUM 40 MG PO TABS: 40.0000 mg | ORAL_TABLET | Freq: Every day | ORAL | 3 refills | Status: DC

## 2019-08-05 NOTE — Patient Instructions (Addendum)
Congratulations with your weight loss, stay safe and Happy Holidays   MD/INR home coumadin machine ask your blood specialist to order  Increase your Trazadone to 150 mg at night   Insomnia Insomnia is a sleep disorder that makes it difficult to fall asleep or stay asleep. Insomnia can cause fatigue, low energy, difficulty concentrating, mood swings, and poor performance at work or school. There are three different ways to classify insomnia:  Difficulty falling asleep.  Difficulty staying asleep.  Waking up too early in the morning. Any type of insomnia can be long-term (chronic) or short-term (acute). Both are common. Short-term insomnia usually lasts for three months or less. Chronic insomnia occurs at least three times a week for longer than three months. What are the causes? Insomnia may be caused by another condition, situation, or substance, such as:  Anxiety.  Certain medicines.  Gastroesophageal reflux disease (GERD) or other gastrointestinal conditions.  Asthma or other breathing conditions.  Restless legs syndrome, sleep apnea, or other sleep disorders.  Chronic pain.  Menopause.  Stroke.  Abuse of alcohol, tobacco, or illegal drugs.  Mental health conditions, such as depression.  Caffeine.  Neurological disorders, such as Alzheimer's disease.  An overactive thyroid (hyperthyroidism). Sometimes, the cause of insomnia may not be known. What increases the risk? Risk factors for insomnia include:  Gender. Women are affected more often than men.  Age. Insomnia is more common as you get older.  Stress.  Lack of exercise.  Irregular work schedule or working night shifts.  Traveling between different time zones.  Certain medical and mental health conditions. What are the signs or symptoms? If you have insomnia, the main symptom is having trouble falling asleep or having trouble staying asleep. This may lead to other symptoms, such as:  Feeling fatigued  or having low energy.  Feeling nervous about going to sleep.  Not feeling rested in the morning.  Having trouble concentrating.  Feeling irritable, anxious, or depressed. How is this diagnosed? This condition may be diagnosed based on:  Your symptoms and medical history. Your health care provider may ask about: ? Your sleep habits. ? Any medical conditions you have. ? Your mental health.  A physical exam. How is this treated? Treatment for insomnia depends on the cause. Treatment may focus on treating an underlying condition that is causing insomnia. Treatment may also include:  Medicines to help you sleep.  Counseling or therapy.  Lifestyle adjustments to help you sleep better. Follow these instructions at home: Eating and drinking   Limit or avoid alcohol, caffeinated beverages, and cigarettes, especially close to bedtime. These can disrupt your sleep.  Do not eat a large meal or eat spicy foods right before bedtime. This can lead to digestive discomfort that can make it hard for you to sleep. Sleep habits   Keep a sleep diary to help you and your health care provider figure out what could be causing your insomnia. Write down: ? When you sleep. ? When you wake up during the night. ? How well you sleep. ? How rested you feel the next day. ? Any side effects of medicines you are taking. ? What you eat and drink.  Make your bedroom a dark, comfortable place where it is easy to fall asleep. ? Put up shades or blackout curtains to block light from outside. ? Use a white noise machine to block noise. ? Keep the temperature cool.  Limit screen use before bedtime. This includes: ? Watching TV. ? Using your  smartphone, tablet, or computer.  Stick to a routine that includes going to bed and waking up at the same times every day and night. This can help you fall asleep faster. Consider making a quiet activity, such as reading, part of your nighttime routine.  Try to  avoid taking naps during the day so that you sleep better at night.  Get out of bed if you are still awake after 15 minutes of trying to sleep. Keep the lights down, but try reading or doing a quiet activity. When you feel sleepy, go back to bed. General instructions  Take over-the-counter and prescription medicines only as told by your health care provider.  Exercise regularly, as told by your health care provider. Avoid exercise starting several hours before bedtime.  Use relaxation techniques to manage stress. Ask your health care provider to suggest some techniques that may work well for you. These may include: ? Breathing exercises. ? Routines to release muscle tension. ? Visualizing peaceful scenes.  Make sure that you drive carefully. Avoid driving if you feel very sleepy.  Keep all follow-up visits as told by your health care provider. This is important. Contact a health care provider if:  You are tired throughout the day.  You have trouble in your daily routine due to sleepiness.  You continue to have sleep problems, or your sleep problems get worse. Get help right away if:  You have serious thoughts about hurting yourself or someone else. If you ever feel like you may hurt yourself or others, or have thoughts about taking your own life, get help right away. You can go to your nearest emergency department or call:  Your local emergency services (911 in the [place]  A suicide crisis helpline, such as the [place] at (309)214-0208. This is open 24 hours a day. Summary  Insomnia is a sleep disorder that makes it difficult to fall asleep or stay asleep.  Insomnia can be long-term (chronic) or short-term (acute).  Treatment for insomnia depends on the cause. Treatment may focus on treating an underlying condition that is causing insomnia.  Keep a sleep diary to help you and your health care provider figure out what could be causing your  insomnia. This information is not intended to replace advice given to you by your health care provider. Make sure you discuss any questions you have with your health care provider. Document Released: 08/05/2000 Document Revised: 07/21/2017 Document Reviewed: 05/18/2017 Elsevier Patient Education  2020 [employer].   Budget-Friendly Healthy Eating There are many ways to save money at the grocery store and continue to eat healthy. You can be successful if you:  Plan meals according to your budget.  Make a grocery list and only purchase food according to your grocery list.  Prepare food yourself. What are tips for following this plan?  Reading food labels  Compare food labels between brand name foods and the store brand. Often the nutritional value is the same, but the store brand is lower cost.  Look for products that do not have added sugar, fat, or salt (sodium). These often cost the same but are healthier for you. Products may be labeled as: ? Sugar-free. ? Nonfat. ? Low-fat. ? Sodium-free. ? Low-sodium.  Look for lean ground beef labeled as at least 92% lean and 8% fat. Shopping  Buy only the items on your grocery list and go only to the areas of the store that have the items on your list.  Use coupons  only for foods and brands you normally buy. Avoid buying items you wouldn't normally buy simply because they are on sale.  Check online and in newspapers for weekly deals.  Buy healthy items from the bulk bins when available, such as herbs, spices, flour, pasta, nuts, and dried fruit.  Buy fruits and vegetables that are in season. Prices are usually lower on in-season produce.  Look at the unit price on the price tag. Use it to compare different brands and sizes to find out which item is the best deal.  Choose healthy items that are often low-cost, such as carrots, potatoes, apples, bananas, and oranges. Dried or canned beans are a low-cost protein source.  Buy in bulk  and freeze extra food. Items you can buy in bulk include meats, fish, poultry, frozen fruits, and frozen vegetables.  Avoid buying "ready-to-eat" foods, such as pre-cut fruits and vegetables and pre-made salads.  If possible, shop around to discover where you can find the best prices. Consider other retailers such as dollar stores, larger [employer], local fruit and vegetable stands, and farmers markets.  Do not shop when you are hungry. If you shop while hungry, it may be hard to stick to your list and budget.  Resist impulse buying. Use your grocery list as your official plan for the week.  Buy a variety of vegetables and fruits by purchasing fresh, frozen, and canned items.  Look at the top and bottom shelves for deals. Foods at eye level (eye level of an adult or child) are usually more expensive.  Be efficient with your time when shopping. The more time you spend at the store, the more money you are likely to spend.  To save money when choosing more expensive foods like meats and dairy: ? Choose cheaper cuts of meat, such as bone-in chicken thighs and drumsticks instead of skinless and boneless chicken. When you are ready to prepare the chicken, you can remove the skin yourself to make it healthier. ? Choose lean meats like chicken or [place] instead of beef. ? Choose canned seafood, such as tuna, salmon, or sardines. ? Buy eggs as a low-cost source of protein. ? Buy dried beans and peas, such as lentils, split peas, or kidney beans instead of meats. Dried beans and peas are a good alternative source of protein. ? Buy the larger tubs of yogurt instead of individual-sized containers.  Choose water instead of sodas and other sweetened beverages.  Avoid buying chips, cookies, and other "junk food." These items are usually expensive and not healthy. Cooking  Make extra food and freeze the extras in meal-sized containers or in individual portions for fast meals and  snacks.  Pre-cook on days when you have extra time to prepare meals in advance. You can keep these meals in the fridge or freezer and reheat for a quick meal.  When you come home from the grocery store, wash, peel, and cut fruits and vegetables so they are ready to use and eat. This will help reduce food waste. Meal planning  Do not eat out or get fast food. Prepare food at home.  Make a grocery list and make sure to bring it with you to the store. If you have a smart phone, you could use your phone to create your shopping list.  Plan meals and snacks according to a grocery list and budget you create.  Use leftovers in your meal plan for the week.  Look for recipes where you can cook once and  make enough food for two meals.  Include budget-friendly meals like stews, casseroles, and stir-fry dishes.  Try some meatless meals or try "no cook" meals like salads.  Make sure that half your plate is filled with fruits or vegetables. Choose from fresh, frozen, or canned fruits and vegetables. If eating canned, remember to rinse them before eating. This will remove any excess salt added for packaging. Summary  Eating healthy on a budget is possible if you plan your meals according to your budget, purchase according to your budget and grocery list, and prepare food yourself.  Tips for buying more food on a limited budget include buying generic brands, using coupons only for foods you normally buy, and buying healthy items from the bulk bins when available.  Tips for buying cheaper food to replace expensive food include choosing cheaper, lean cuts of meat, and buying dried beans and peas. This information is not intended to replace advice given to you by your health care provider. Make sure you discuss any questions you have with your health care provider. Document Released: 04/11/2014 Document Revised: 08/09/2017 Document Reviewed: 08/09/2017 Elsevier Patient Education  2020 [place] Following a healthy eating pattern may help you to achieve and maintain a healthy body weight, reduce the risk of chronic disease, and live a long and productive life. It is important to follow a healthy eating pattern at an appropriate calorie level for your body. Your nutritional needs should be met primarily through food by choosing a variety of nutrient-rich foods. What are tips for following this plan? Reading food labels  Read labels and choose the following: ? Reduced or low sodium. ? Juices with 100% fruit juice. ? Foods with low saturated fats and high polyunsaturated and monounsaturated fats. ? Foods with whole grains, such as whole wheat, cracked wheat, brown rice, and wild rice. ? Whole grains that are fortified with folic acid. This is recommended for women who are pregnant or who want to become pregnant.  Read labels and avoid the following: ? Foods with a lot of added sugars. These include foods that contain brown sugar, corn sweetener, corn syrup, dextrose, fructose, glucose, high-fructose corn syrup, honey, invert sugar, lactose, malt syrup, maltose, molasses, raw sugar, sucrose, trehalose, or turbinado sugar.  Do not eat more than the following amounts of added sugar per day:  6 teaspoons (25 g) for women.  9 teaspoons (38 g) for men. ? Foods that contain processed or refined starches and grains. ? Refined grain products, such as white flour, degermed cornmeal, white bread, and white rice. Shopping  Choose nutrient-rich snacks, such as vegetables, whole fruits, and nuts. Avoid high-calorie and high-sugar snacks, such as potato chips, fruit snacks, and candy.  Use oil-based dressings and spreads on foods instead of solid fats such as butter, stick margarine, or cream cheese.  Limit pre-made sauces, mixes, and "instant" products such as flavored rice, instant noodles, and ready-made pasta.  Try more plant-protein sources, such as tofu, tempeh,  black beans, edamame, lentils, nuts, and seeds.  Explore eating plans such as the Mediterranean diet or vegetarian diet. Cooking  Use oil to saut or stir-fry foods instead of solid fats such as butter, stick margarine, or lard.  Try baking, boiling, grilling, or broiling instead of frying.  Remove the fatty part of meats before cooking.  Steam vegetables in water or broth. Meal planning   At meals, imagine dividing your plate into fourths: ? One-half of your plate is fruits  and vegetables. ? One-fourth of your plate is whole grains. ? One-fourth of your plate is protein, especially lean meats, poultry, eggs, tofu, beans, or nuts.  Include low-fat dairy as part of your daily diet. Lifestyle  Choose healthy options in all settings, including home, work, school, restaurants, or stores.  Prepare your food safely: ? Wash your hands after handling raw meats. ? Keep food preparation surfaces clean by regularly washing with hot, soapy water. ? Keep raw meats separate from ready-to-eat foods, such as fruits and vegetables. ? Cook seafood, meat, poultry, and eggs to the recommended internal temperature. ? Store foods at safe temperatures. In general:  Keep cold foods at 28F (4.4C) or below.  Keep hot foods at 128F (60C) or above.  Keep your freezer at [facility] (-17.8C) or below.  Foods are no longer safe to eat when they have been between the temperatures of 40-128F (4.4-60C) for more than 2 hours. What foods should I eat? Fruits Aim to eat 2 cup-equivalents of fresh, canned (in natural juice), or frozen fruits each day. Examples of 1 cup-equivalent of fruit include 1 small apple, 8 large strawberries, 1 cup canned fruit,  cup dried fruit, or 1 cup 100% juice. Vegetables Aim to eat 2-3 cup-equivalents of fresh and frozen vegetables each day, including different varieties and colors. Examples of 1 cup-equivalent of vegetables include 2 medium carrots, 2 cups raw, leafy greens, 1  cup chopped vegetable (raw or cooked), or 1 medium baked potato. Grains Aim to eat 6 ounce-equivalents of whole grains each day. Examples of 1 ounce-equivalent of grains include 1 slice of bread, 1 cup ready-to-eat cereal, 3 cups popcorn, or  cup cooked rice, pasta, or cereal. Meats and other proteins Aim to eat 5-6 ounce-equivalents of protein each day. Examples of 1 ounce-equivalent of protein include 1 egg, 1/2 cup nuts or seeds, or 1 tablespoon (16 g) peanut butter. A cut of meat or fish that is the size of a deck of cards is about 3-4 ounce-equivalents.  Of the protein you eat each week, try to have at least 8 ounces come from seafood. This includes salmon, trout, herring, and anchovies. Dairy Aim to eat 3 cup-equivalents of fat-free or low-fat dairy each day. Examples of 1 cup-equivalent of dairy include 1 cup (240 mL) milk, 8 ounces (250 g) yogurt, 1 ounces (44 g) natural cheese, or 1 cup (240 mL) fortified soy milk. Fats and oils  Aim for about 5 teaspoons (21 g) per day. Choose monounsaturated fats, such as canola and olive oils, avocados, peanut butter, and most nuts, or polyunsaturated fats, such as sunflower, corn, and soybean oils, walnuts, pine nuts, sesame seeds, sunflower seeds, and flaxseed. Beverages  Aim for six 8-oz glasses of water per day. Limit coffee to three to five 8-oz cups per day.  Limit caffeinated beverages that have added calories, such as soda and energy drinks.  Limit alcohol intake to no more than 1 drink a day for nonpregnant women and 2 drinks a day for men. One drink equals 12 oz of beer (355 mL), 5 oz of wine (148 mL), or 1 oz of hard liquor (44 mL). Seasoning and other foods  Avoid adding excess amounts of salt to your foods. Try flavoring foods with herbs and spices instead of salt.  Avoid adding sugar to foods.  Try using oil-based dressings, sauces, and spreads instead of solid fats. This information is based on general [place] nutrition  guidelines. For more information, visit [employer]. Exact amounts may  vary based on your nutrition needs. Summary  A healthy eating plan may help you to maintain a healthy weight, reduce the risk of chronic diseases, and stay active throughout your life.  Plan your meals. Make sure you eat the right portions of a variety of nutrient-rich foods.  Try baking, boiling, grilling, or broiling instead of frying.  Choose healthy options in all settings, including home, work, school, restaurants, or stores. This information is not intended to replace advice given to you by your health care provider. Make sure you discuss any questions you have with your health care provider. Document Released: 11/20/2017 Document Revised: 11/20/2017 Document Reviewed: 11/20/2017 Elsevier Patient Education  [place] and Warfarin Warfarin is a blood thinner (anticoagulant). Anticoagulant medicines help prevent the formation of blood clots. These medicines work by decreasing the activity of vitamin K, which promotes normal blood clotting. When you take warfarin, problems can occur from suddenly increasing or decreasing the amount of vitamin K that you eat from one day to the next. Problems may include:  Blood clots.  Bleeding. What general guidelines do I need to follow? To avoid problems when taking warfarin:  Eat a balanced diet that includes: ? Fresh fruits and vegetables. ? Whole grains. ? Low-fat dairy products. ? Lean proteins, such as fish, eggs, and lean cuts of meat.  Keep your intake of vitamin K consistent from day to day. To do this: ? Avoid eating large amounts of vitamin K one day and low amounts of vitamin K the next day. ? If you take a multivitamin that contains vitamin K, be sure to take it every day. ? Know which foods contain vitamin K. Use the lists below to understand serving sizes and the amount of vitamin K in one serving.  Avoid major changes in  your diet. If you are going to change your diet, talk with your health care provider before making changes.  Work with a Financial planner (dietitian) to develop a meal plan that works best for you.  High vitamin K foods Foods that are high in vitamin K contain more than 100 mcg (micrograms) per serving. These include:  Broccoli (cooked) -  cup has 110 mcg.  Brussels sprouts (cooked) -  cup has 109 mcg.  Greens, beet (cooked) -  cup has 350 mcg.  Greens, collard (cooked) -  cup has 418 mcg.  Greens, turnip (cooked) -  cup has 265 mcg.  Green onions or scallions -  cup has 105 mcg.  Kale (fresh or frozen) -  cup has 531 mcg.  Parsley (raw) - 10 sprigs has 164 mcg.  Spinach (cooked) -  cup has 444 mcg.  Swiss chard (cooked) -  cup has 287 mcg. Moderate vitamin K foods Foods that have a moderate amount of vitamin K contain 25-100 mcg per serving. These include:  Asparagus (cooked) - 5 spears have 38 mcg.  Black-eyed peas (dried) -  cup has 32 mcg.  Cabbage (cooked) -  cup has 37 mcg.  Kiwi fruit - 1 medium has 31 mcg.  Lettuce - 1 cup has 57-63 mcg.  Okra (frozen) -  cup has 44 mcg.  Prunes (dried) - 5 prunes have 25 mcg.  Watercress (raw) - 1 cup has 85 mcg. Low vitamin K foods Foods low in vitamin K contain less than 25 mcg per serving. These include:  Artichoke - 1 medium has 18 mcg.  Avocado - 1 oz. has 6 mcg.  Blueberries -  cup has 14 mcg.  Cabbage (raw) -  cup has 21 mcg.  Carrots (cooked) -  cup has 11 mcg.  Cauliflower (raw) -  cup has 11 mcg.  Cucumber with peel (raw) -  cup has 9 mcg.  Grapes -  cup has 12 mcg.  Mango - 1 medium has 9 mcg.  Nuts - 1 oz. has 15 mcg.  Pear - 1 medium has 8 mcg.  Peas (cooked) -  cup has 19 mcg.  Pickles - 1 spear has 14 mcg.  Pumpkin seeds - 1 oz. has 13 mcg.  Sauerkraut (canned) -  cup has 16 mcg.  Soybeans (cooked) -  cup has 16 mcg.  Tomato (raw) - 1 medium has 10  mcg.  Tomato sauce -  cup has 17 mcg. Vitamin K-free foods If a food contain less than 5 mcg per serving, it is considered to have no vitamin K. These foods include:  Bread and cereal products.  Cheese.  Eggs.  Fish and shellfish.  Meat and poultry.  Milk and dairy products.  Sunflower seeds. Actual amounts of vitamin K in foods may be different depending on processing. Talk with your dietitian about what foods you can eat and what foods you should avoid. This information is not intended to replace advice given to you by your health care provider. Make sure you discuss any questions you have with your health care provider. Document Released: 06/05/2009 Document Revised: 07/21/2017 Document Reviewed: 11/11/2015 Elsevier Patient Education  2020 [place].    Meniscus Tear  A meniscus tear is a knee injury that happens when a piece of the meniscus is torn. The meniscus is a thick, rubbery, wedge-shaped cartilage in the knee. Two menisci are located in each knee. They sit between the upper bone (femur) and lower bone (tibia) that make up the knee joint. Each meniscus acts as a shock absorber for the knee. A torn meniscus is one of the most common types of knee injuries. This injury can range from mild to severe. Surgery may be needed to repair a severe tear. What are the causes? This condition may be caused by any kneeling, squatting, twisting, or pivoting movement. Sports-related injuries are the most common cause. These often occur from:  Running and stopping suddenly. ? Changing direction. ? Being tackled or knocked off your feet.  Lifting or carrying heavy weights. As people get older, their menisci get thinner and weaker. In these people, tears can happen more easily, such as from climbing stairs. What increases the risk? You are more likely to develop this condition if you:  Play contact sports.  Have a job that requires kneeling or squatting.  Are female.  Are  over 72 years old. What are the signs or symptoms? Symptoms of this condition include:  Knee pain, especially at the side of the knee joint. You may feel pain when the injury occurs, or you may only hear a pop and feel pain later.  A feeling that your knee is clicking, catching, locking, or giving way.  Not being able to fully bend or extend your knee.  Bruising or swelling in your knee. How is this diagnosed? This condition may be diagnosed based on your symptoms and a physical exam. You may also have tests, such as:  X-rays.  MRI.  A procedure to look inside your knee with a narrow surgical telescope (arthroscopy). You may be referred to a knee specialist (orthopedic surgeon). How is this treated? Treatment  for this injury depends on the severity of the tear. Treatment for a mild tear may include:  Rest.  Medicine to reduce pain and swelling. This is usually a nonsteroidal anti-inflammatory drug (NSAID), like ibuprofen.  A knee brace, sleeve, or wrap.  Using crutches or a walker to keep weight off your knee and to help you walk.  Exercises to strengthen your knee (physical therapy). You may need surgery if you have a severe tear or if other treatments are not working. Follow these instructions at home: If you have a brace, sleeve, or wrap:  Wear it as told by your health care provider. Remove it only as told by your health care provider.  Loosen the brace, sleeve, or wrap if your toes tingle, become numb, or turn cold and blue.  Keep the brace, sleeve, or wrap clean and dry.  If the brace, sleeve, or wrap is not waterproof: ? Do not let it get wet. ? Cover it with a watertight covering when you take a bath or shower. Managing pain and swelling   Take over-the-counter and prescription medicines only as told by your health care provider.  If directed, put ice on your knee: ? If you have a removable brace, sleeve, or wrap, remove it as told by your health care  provider. ? Put ice in a plastic bag. ? Place a towel between your skin and the bag. ? Leave the ice on for 20 minutes, 2-3 times per day.  Move your toes often to avoid stiffness and to lessen swelling.  Raise (elevate) the injured area above the level of your heart while you are sitting or lying down. Activity  Do not use the injured limb to support your body weight until your health care provider says that you can. Use crutches or a walker as told by your health care provider.  Return to your normal activities as told by your health care provider. Ask your health care provider what activities are safe for you.  Perform range-of-motion exercises only as told by your health care provider.  Begin doing exercises to strengthen your knee and leg muscles only as told by your health care provider. After you recover, your health care provider may recommend these exercises to help prevent another injury. General instructions  Use a knee brace, sleeve, or wrap as told by your health care provider.  Ask your health care provider when it is safe to drive if you have a brace, sleeve, or wrap on your knee.  Do not use any products that contain nicotine or tobacco, such as cigarettes, e-cigarettes, and chewing tobacco. If you need help quitting, ask your health care provider.  Ask your health care provider if the medicine prescribed to you: ? Requires you to avoid driving or using heavy machinery. ? Can cause constipation. You may need to take these actions to prevent or treat constipation:  Drink enough fluid to keep your urine pale yellow.  Take over-the-counter or prescription medicines.  Eat foods that are high in fiber, such as beans, whole grains, and fresh fruits and vegetables.  Limit foods that are high in fat and processed sugars, such as fried or sweet foods.  Keep all follow-up visits as told by your health care provider. This is important. Contact a health care provider  if:  You have a fever.  Your knee becomes red, tender, or swollen.  Your pain medicine is not helping.  Your symptoms get worse or do not improve after 2 weeks  of home care. Summary  A meniscus tear is a knee injury that happens when a piece of the meniscus is torn.  Treatment for this injury depends on the severity of the tear. You may need surgery if you have a severe tear or if other treatments are not working.  Rest, ice, and raise (elevate) your injured knee as told by your health care provider. This will help lessen pain and swelling.  Contact a health care provider if you have new symptoms, or your symptoms get worse or do not improve after 2 weeks of home care.  Keep all follow-up visits as told by your health care provider. This is important. This information is not intended to replace advice given to you by your health care provider. Make sure you discuss any questions you have with your health care provider. Document Released: 10/29/2002 Document Revised: 02/20/2018 Document Reviewed: 02/20/2018 Elsevier Patient Education  2020 [employer].

## 2019-08-08 ENCOUNTER — Other Ambulatory Visit: Payer: Self-pay | Admitting: *Deleted

## 2019-08-08 DIAGNOSIS — R110 Nausea: Secondary | ICD-10-CM | POA: Diagnosis not present

## 2019-08-08 DIAGNOSIS — G894 Chronic pain syndrome: Secondary | ICD-10-CM | POA: Diagnosis not present

## 2019-08-08 DIAGNOSIS — Z79891 Long term (current) use of opiate analgesic: Secondary | ICD-10-CM | POA: Diagnosis not present

## 2019-08-08 DIAGNOSIS — G890 Central pain syndrome: Secondary | ICD-10-CM | POA: Diagnosis not present

## 2019-08-08 MED ORDER — WARFARIN SODIUM 5 MG PO TABS: ORAL_TABLET | ORAL | 1 refills | Status: DC

## 2019-08-12 ENCOUNTER — Other Ambulatory Visit: Payer: Self-pay

## 2019-08-13 ENCOUNTER — Inpatient Hospital Stay: Payer: Medicare Other

## 2019-08-13 ENCOUNTER — Encounter: Payer: Self-pay | Admitting: *Deleted

## 2019-08-13 ENCOUNTER — Other Ambulatory Visit: Payer: Self-pay

## 2019-08-13 DIAGNOSIS — D6861 Antiphospholipid syndrome: Secondary | ICD-10-CM

## 2019-08-13 LAB — PROTIME-INR
INR: 3.1 — ABNORMAL HIGH (ref 0.8–1.2)
Prothrombin Time: 32.1 seconds — ABNORMAL HIGH (ref 11.4–15.2)

## 2019-08-14 NOTE — Progress Notes (Signed)
Called pt and since she was over the INR by just 0.1 then she should just stay on same dose and recheck in 2 weeks at next appt. Asked pt to call me if she had questions.-left this on her voicemail

## 2019-08-20 ENCOUNTER — Inpatient Hospital Stay: Payer: Medicare Other

## 2019-08-20 ENCOUNTER — Other Ambulatory Visit: Payer: Self-pay

## 2019-08-20 ENCOUNTER — Telehealth: Payer: Self-pay | Admitting: *Deleted

## 2019-08-20 DIAGNOSIS — D6861 Antiphospholipid syndrome: Secondary | ICD-10-CM | POA: Diagnosis not present

## 2019-08-20 LAB — PROTIME-INR
INR: 3.5 — ABNORMAL HIGH (ref 0.8–1.2)
Prothrombin Time: 34.8 seconds — ABNORMAL HIGH (ref 11.4–15.2)

## 2019-08-20 NOTE — Telephone Encounter (Signed)
Called pt about INR to thin 3.5. Dr Janese Banks said to decrease from 5 days of 10 mg to 4 days of 10 mg and then 7.5 mg now 3 times aweek. She has next appt for 1/5 and she will keep it and check her level then and make changes if needed.

## 2019-08-24 ENCOUNTER — Encounter: Payer: Self-pay | Admitting: Oncology

## 2019-08-26 ENCOUNTER — Other Ambulatory Visit: Payer: Self-pay

## 2019-08-27 ENCOUNTER — Other Ambulatory Visit: Payer: Self-pay

## 2019-08-27 ENCOUNTER — Inpatient Hospital Stay: Payer: Medicare Other | Attending: Oncology

## 2019-08-27 DIAGNOSIS — D6861 Antiphospholipid syndrome: Secondary | ICD-10-CM | POA: Diagnosis not present

## 2019-08-27 LAB — PROTIME-INR
INR: 2.1 — ABNORMAL HIGH (ref 0.8–1.2)
Prothrombin Time: 23.5 seconds — ABNORMAL HIGH (ref 11.4–15.2)

## 2019-09-04 DIAGNOSIS — R110 Nausea: Secondary | ICD-10-CM | POA: Diagnosis not present

## 2019-09-04 DIAGNOSIS — G894 Chronic pain syndrome: Secondary | ICD-10-CM | POA: Diagnosis not present

## 2019-09-04 DIAGNOSIS — G890 Central pain syndrome: Secondary | ICD-10-CM | POA: Diagnosis not present

## 2019-09-04 DIAGNOSIS — Z79891 Long term (current) use of opiate analgesic: Secondary | ICD-10-CM | POA: Diagnosis not present

## 2019-09-05 ENCOUNTER — Other Ambulatory Visit: Payer: Medicare Other

## 2019-09-10 ENCOUNTER — Inpatient Hospital Stay: Payer: Medicare Other | Attending: Oncology

## 2019-09-10 ENCOUNTER — Encounter (INDEPENDENT_AMBULATORY_CARE_PROVIDER_SITE_OTHER): Payer: Medicare Other | Admitting: Internal Medicine

## 2019-09-10 ENCOUNTER — Other Ambulatory Visit: Payer: Self-pay

## 2019-09-10 ENCOUNTER — Encounter: Payer: Self-pay | Admitting: Oncology

## 2019-09-10 DIAGNOSIS — H6693 Otitis media, unspecified, bilateral: Secondary | ICD-10-CM | POA: Diagnosis not present

## 2019-09-10 DIAGNOSIS — D6861 Antiphospholipid syndrome: Secondary | ICD-10-CM

## 2019-09-10 LAB — PROTIME-INR
INR: 1.8 — ABNORMAL HIGH (ref 0.8–1.2)
Prothrombin Time: 20.7 seconds — ABNORMAL HIGH (ref 11.4–15.2)

## 2019-09-11 MED ORDER — WARFARIN SODIUM 7.5 MG PO TABS: 7.5000 mg | ORAL_TABLET | Freq: Every day | ORAL | 1 refills | Status: DC

## 2019-09-11 MED ORDER — WARFARIN SODIUM 10 MG PO TABS: 10.0000 mg | ORAL_TABLET | Freq: Every day | ORAL | 1 refills | Status: DC

## 2019-09-12 ENCOUNTER — Other Ambulatory Visit: Payer: Self-pay | Admitting: Internal Medicine

## 2019-09-12 DIAGNOSIS — H6690 Otitis media, unspecified, unspecified ear: Secondary | ICD-10-CM

## 2019-09-12 MED ORDER — AMOXICILLIN-POT CLAVULANATE 875-125 MG PO TABS: 1.0000 | ORAL_TABLET | Freq: Two times a day (BID) | ORAL | 0 refills | Status: DC

## 2019-09-12 NOTE — Telephone Encounter (Signed)
Absolutely. [employer] for it. I really appreciate it. I plan on visiting the pharmacy this afternoon so could pick it up then if that works for you. I have some decongestant but it's not working as effectively as I would like.   Thanks so much and have a good afternoon!   Clarise Cruz       Previous Messages  Visit Follow-Up Question  Channel, Tee  You Yesterday (12:15 PM)   Absolutely. [employer] for it. I really appreciate it. I plan on visiting the pharmacy this afternoon so could pick it up then if that works for you. I have some decongestant but it's not working as effectively as I would like.   Thanks so much and have a good afternoon!   [place] (12:13 PM)  TM If you would like to try this we can bill you for a my chart consult for antibiotics, are you agreeable for this?  Otherwise you will need to see ent if not better?     Tokie, Zemp  You Yesterday (11:03 AM)   Do I need a specialist for my earache or could I just get some amoxicillin which has worked in the past. I'm trying to stay out of medical offices. Sorry to be such a pain in the butt!  Thanks!   [place] (10:43 AM)  TM ENT referral  Would you like one?     Babs Bertin, CMA routed conversation to [employer] (9:11 AM)  Danelle Earthly  You 2 days ago   Hi Dr Olivia Mackie, I hope this note finds you well.   I'm writing because I have a build up of fluid behind my ears, and especially so on the right side.   I'm afraid it will wind up becoming infected and causing increased pain and sickness. This is something I've gone through many times and I always end up with an ear infection. I can feel in my ear, especially when I swallow. Also, it sounds like there is a big bass drum in my right ear.   What do you suggest I do?   Thank you and I appreciate it.   A/p 1. OM b/l Augmentin, otc ah, nasal saline, flonase. Tylenol    Sudafed if needed for 3 days or less   Time 5-10 minutes  Pt agreeable to fee  [place]

## 2019-09-17 ENCOUNTER — Other Ambulatory Visit: Payer: Self-pay | Admitting: Internal Medicine

## 2019-09-19 ENCOUNTER — Other Ambulatory Visit: Payer: Self-pay

## 2019-09-19 ENCOUNTER — Ambulatory Visit (INDEPENDENT_AMBULATORY_CARE_PROVIDER_SITE_OTHER): Payer: Medicare Other

## 2019-09-19 VITALS — Ht 65.0 in | Wt 189.0 lb

## 2019-09-19 DIAGNOSIS — Z0000 Encounter for general adult medical examination without abnormal findings: Secondary | ICD-10-CM | POA: Diagnosis not present
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
## 2019-09-19 NOTE — Patient Instructions (Addendum)
  Amanda Vasquez , Thank you for taking time to come for your Medicare Wellness Visit. I appreciate your ongoing commitment to your health goals. Please review the following plan we discussed and let me know if I can assist you in the future.   These are the goals we discussed: Goals      Patient Stated   . Healthy Lifestyle (pt-stated)     Lose weight  Healthier eating habits. Low carb/high protein.  Stay active        This is a list of the screening recommended for you and due dates:  Health Maintenance  Topic Date Due  . Complete foot exam   01/19/2018  . Urine Protein Check  12/19/2018  . Hemoglobin A1C  08/20/2019  . Eye exam for diabetics  06/05/2020  . Pap Smear  10/04/2020  . Mammogram  04/07/2021  . Tetanus Vaccine  10/25/2028  . Flu Shot  Completed  . Pneumococcal vaccine  Completed  . HIV Screening  Completed

## 2019-09-23 ENCOUNTER — Other Ambulatory Visit: Payer: Self-pay | Admitting: Family Medicine

## 2019-09-24 ENCOUNTER — Inpatient Hospital Stay: Payer: Medicare Other | Attending: Oncology

## 2019-09-24 ENCOUNTER — Encounter: Payer: Self-pay | Admitting: Internal Medicine

## 2019-09-24 ENCOUNTER — Telehealth: Payer: Self-pay | Admitting: *Deleted

## 2019-09-24 DIAGNOSIS — D6861 Antiphospholipid syndrome: Secondary | ICD-10-CM | POA: Diagnosis not present

## 2019-09-24 DIAGNOSIS — Z7901 Long term (current) use of anticoagulants: Secondary | ICD-10-CM | POA: Diagnosis not present

## 2019-09-24 NOTE — Telephone Encounter (Signed)
Patient called with INR results and is asking what to do. INR 3.7. Please advise

## 2019-09-24 NOTE — Telephone Encounter (Signed)
TM-Plz see refill req/thx dmf 

## 2019-09-25 ENCOUNTER — Encounter: Payer: Self-pay | Admitting: Oncology

## 2019-09-25 LAB — PROTIME-INR: INR: 3.7 — AB (ref 0.9–1.1)

## 2019-10-02 ENCOUNTER — Telehealth: Payer: Self-pay | Admitting: *Deleted

## 2019-10-02 NOTE — Telephone Encounter (Signed)
Pt is 3.1 on INR today . I spoke to Janese Banks and she states that she can stay on the same dose.4 days of 7.5mg  and 3 days of 10 mg alternating doses each day. She will stay on that amount and recheck next tues. tues is good for her but she forgot yest. To do the level.

## 2019-10-03 ENCOUNTER — Other Ambulatory Visit: Payer: Self-pay | Admitting: Neurology

## 2019-10-03 ENCOUNTER — Encounter: Payer: Self-pay | Admitting: Oncology

## 2019-10-03 DIAGNOSIS — Z79891 Long term (current) use of opiate analgesic: Secondary | ICD-10-CM | POA: Diagnosis not present

## 2019-10-03 DIAGNOSIS — G894 Chronic pain syndrome: Secondary | ICD-10-CM | POA: Diagnosis not present

## 2019-10-03 DIAGNOSIS — G890 Central pain syndrome: Secondary | ICD-10-CM | POA: Diagnosis not present

## 2019-10-03 DIAGNOSIS — R110 Nausea: Secondary | ICD-10-CM | POA: Diagnosis not present

## 2019-10-03 DIAGNOSIS — G43711 Chronic migraine without aura, intractable, with status migrainosus: Secondary | ICD-10-CM

## 2019-10-07 ENCOUNTER — Telehealth: Payer: Self-pay | Admitting: Oncology

## 2019-10-07 NOTE — Telephone Encounter (Signed)
Phoned patient for appt reminder for appt on 10-08-19. Patient stated that she has been taking the INR lab draws at home and does not need to attend appt on 10-08-19. Patient stated that she requests the company that does the test to inform MD of results and will also inform RN of results on mychart. Lab appt for 10-08-19 has been cancelled per patient request.

## 2019-10-08 ENCOUNTER — Inpatient Hospital Stay: Payer: Medicare Other

## 2019-10-09 ENCOUNTER — Encounter: Payer: Self-pay | Admitting: Oncology

## 2019-10-09 DIAGNOSIS — R197 Diarrhea, unspecified: Secondary | ICD-10-CM | POA: Diagnosis not present

## 2019-10-09 DIAGNOSIS — R110 Nausea: Secondary | ICD-10-CM | POA: Diagnosis not present

## 2019-10-09 DIAGNOSIS — Z20822 Contact with and (suspected) exposure to covid-19: Secondary | ICD-10-CM | POA: Diagnosis not present

## 2019-10-09 DIAGNOSIS — K529 Noninfective gastroenteritis and colitis, unspecified: Secondary | ICD-10-CM | POA: Diagnosis not present

## 2019-10-10 ENCOUNTER — Telehealth: Payer: Self-pay

## 2019-10-10 ENCOUNTER — Ambulatory Visit: Payer: Medicare Other | Admitting: Internal Medicine

## 2019-10-10 NOTE — Telephone Encounter (Signed)
I called the patients insurance and spoke with [facility] to check coverage and pre cert requirements for codes [id number] and [phone]. Stanton Kidney stated that these codes were valid and billable with [employer]. Ref#[id number]

## 2019-10-11 ENCOUNTER — Telehealth: Payer: Self-pay | Admitting: *Deleted

## 2019-10-11 NOTE — Telephone Encounter (Signed)
Called and left message that INR 2.6. she should continue her current dose: .4 days of 7.5mg  and 3 days of 10 mg alternating doses each day. She will need to check her INR in 2 weeks which will be 3/2.if she has questions she can call.

## 2019-10-13 ENCOUNTER — Other Ambulatory Visit: Payer: Self-pay | Admitting: Oncology

## 2019-10-13 DIAGNOSIS — D6861 Antiphospholipid syndrome: Secondary | ICD-10-CM

## 2019-10-15 ENCOUNTER — Ambulatory Visit: Payer: Medicare Other | Admitting: Neurology

## 2019-10-17 ENCOUNTER — Telehealth: Payer: Self-pay | Admitting: *Deleted

## 2019-10-17 DIAGNOSIS — D6861 Antiphospholipid syndrome: Secondary | ICD-10-CM

## 2019-10-17 MED ORDER — WARFARIN SODIUM 10 MG PO TABS: 10.0000 mg | ORAL_TABLET | ORAL | 1 refills | Status: DC

## 2019-10-17 MED ORDER — WARFARIN SODIUM 7.5 MG PO TABS: 7.5000 mg | ORAL_TABLET | ORAL | 1 refills | Status: DC

## 2019-10-17 NOTE — Telephone Encounter (Signed)
Pt needs refill for coumadin- she is now on 10 mg 3 times a week, 7.5 mg 4 days a week. Sent rx in for pt to get refills. Pt aware

## 2019-10-21 ENCOUNTER — Telehealth: Payer: Self-pay | Admitting: Oncology

## 2019-10-21 NOTE — Telephone Encounter (Signed)
Writer spoke with patient regarding lab appt on 10-22-19. Patient stated that she is doing lab draws at home and that either she or the company who does the testing is to get the information to the care team. Patient stated that she wanted to cancel the appts for 10-22-19 and 11-05-19 as she will be doing them at home. Lab appts have been cancelled and care team informed.

## 2019-10-22 ENCOUNTER — Inpatient Hospital Stay: Payer: Medicare Other

## 2019-10-22 ENCOUNTER — Encounter: Payer: Self-pay | Admitting: Oncology

## 2019-10-22 DIAGNOSIS — Z7901 Long term (current) use of anticoagulants: Secondary | ICD-10-CM | POA: Diagnosis not present

## 2019-10-22 DIAGNOSIS — D6861 Antiphospholipid syndrome: Secondary | ICD-10-CM | POA: Diagnosis not present

## 2019-10-22 LAB — PROTIME-INR: INR: 2.6 — AB (ref 0.9–1.1)

## 2019-10-28 ENCOUNTER — Other Ambulatory Visit: Payer: Self-pay

## 2019-10-28 ENCOUNTER — Other Ambulatory Visit: Payer: Self-pay | Admitting: Internal Medicine

## 2019-10-28 DIAGNOSIS — F32A Depression, unspecified: Secondary | ICD-10-CM

## 2019-10-28 DIAGNOSIS — F329 Major depressive disorder, single episode, unspecified: Secondary | ICD-10-CM

## 2019-10-28 DIAGNOSIS — F419 Anxiety disorder, unspecified: Secondary | ICD-10-CM

## 2019-10-28 MED ORDER — ALPRAZOLAM 0.5 MG PO TABS: 0.5000 mg | ORAL_TABLET | Freq: Every day | ORAL | 5 refills | Status: DC | PRN

## 2019-10-28 NOTE — Telephone Encounter (Signed)
Pended for your approval or denial.  Patient last seen 08/01/19. Med last sent 04/23/19.

## 2019-10-29 ENCOUNTER — Encounter: Payer: Self-pay | Admitting: Oncology

## 2019-10-29 LAB — PROTIME-INR: INR: 3.2 — AB (ref 0.9–1.1)

## 2019-10-31 DIAGNOSIS — Z79891 Long term (current) use of opiate analgesic: Secondary | ICD-10-CM | POA: Diagnosis not present

## 2019-10-31 DIAGNOSIS — R110 Nausea: Secondary | ICD-10-CM | POA: Diagnosis not present

## 2019-10-31 DIAGNOSIS — G894 Chronic pain syndrome: Secondary | ICD-10-CM | POA: Diagnosis not present

## 2019-10-31 DIAGNOSIS — G890 Central pain syndrome: Secondary | ICD-10-CM | POA: Diagnosis not present

## 2019-11-01 ENCOUNTER — Ambulatory Visit: Payer: Medicare Other | Attending: Internal Medicine

## 2019-11-01 DIAGNOSIS — Z23 Encounter for immunization: Secondary | ICD-10-CM

## 2019-11-01 NOTE — Progress Notes (Signed)
   Covid-19 Vaccination Clinic  Name:  Amanda Vasquez    MRN: [id number] DOB: [birth year]  11/01/2019  [name] was observed post Covid-19 immunization for 15 minutes without incident. She was provided with Vaccine Information Sheet and instruction to access the V-Safe system.   [name] was instructed to call 911 with any severe reactions post vaccine: [name] Difficulty breathing  . Swelling of face and throat  . A fast heartbeat  . A bad rash all over body  . Dizziness and weakness   Immunizations Administered    Name Date Dose VIS Date Route   Moderna COVID-19 Vaccine 11/01/2019 11:36 AM 0.5 mL 07/23/2019 Intramuscular   Manufacturer: Moderna   Lot: [id number]   [place]:[id number]

## 2019-11-05 ENCOUNTER — Inpatient Hospital Stay: Payer: Medicare Other

## 2019-11-06 ENCOUNTER — Encounter: Payer: Self-pay | Admitting: Oncology

## 2019-11-06 LAB — PROTIME-INR: INR: 3 — AB (ref 0.9–1.1)

## 2019-11-13 ENCOUNTER — Encounter: Payer: Self-pay | Admitting: Oncology

## 2019-11-13 LAB — PROTIME-INR: INR: 1.9 — AB (ref 0.9–1.1)

## 2019-11-14 ENCOUNTER — Ambulatory Visit: Payer: Medicare Other | Admitting: Internal Medicine

## 2019-11-19 ENCOUNTER — Encounter: Payer: Self-pay | Admitting: Oncology

## 2019-11-19 DIAGNOSIS — Z7901 Long term (current) use of anticoagulants: Secondary | ICD-10-CM | POA: Diagnosis not present

## 2019-11-19 DIAGNOSIS — D6861 Antiphospholipid syndrome: Secondary | ICD-10-CM | POA: Diagnosis not present

## 2019-11-19 LAB — PROTIME-INR: INR: 3.1 — AB (ref 0.9–1.1)

## 2019-11-27 ENCOUNTER — Other Ambulatory Visit: Payer: Self-pay | Admitting: Oncology

## 2019-11-27 ENCOUNTER — Other Ambulatory Visit: Payer: Self-pay

## 2019-11-27 ENCOUNTER — Telehealth: Payer: Self-pay

## 2019-11-27 ENCOUNTER — Telehealth: Payer: Self-pay | Admitting: *Deleted

## 2019-11-27 MED ORDER — AMINOCAPROIC ACID SOLUTION 5% (50 MG/ML): ORAL | 0 refills | Status: DC

## 2019-11-27 NOTE — Progress Notes (Signed)
amicar

## 2019-11-27 NOTE — Telephone Encounter (Signed)
Called patient and had to leave her a detailed message letting her know that Dr. Janese Banks reviewed her last PT/INR and would like for her to continue taking the same Coumadin dosage. I will reach out in a week again to check on her lab results.  Pt's PT/INR was last checked on 11/20/19 and it was 3.1. Currently taking Coumadin 10 MG three times a week and 7.5 MG four times a week

## 2019-11-27 NOTE — Telephone Encounter (Addendum)
I called patient and explained to her the request from Dr Nicola Girt and Dr Elroy Channel response to not stop her coumadin. I told her that we will be sending in ar prescription for Amicar mouth rinse to use 5 minutes prior to extraction then 4 times a day  for 1-2 days afterwards.. I let her know that it will be tomorrow before the prescription can be sent and she verbalized understanding

## 2019-11-27 NOTE — Progress Notes (Signed)
ami

## 2019-11-27 NOTE — Telephone Encounter (Signed)
Dr Larwance Rote office called reporting that patient needs a tooth extracted and they need to know how long patient will need to be off blood thinners prior to this extraction. Plan is for extraction on Monday. Please advise

## 2019-11-27 NOTE — Telephone Encounter (Signed)
Call returned to Dr Larwance Rote office and she will get back with me as to Dr Fabian November

## 2019-11-27 NOTE — Addendum Note (Signed)
Addended by: Irean Hong on: 11/27/2019 04:57 PM   Modules accepted: Orders

## 2019-11-27 NOTE — Telephone Encounter (Signed)
Dr Nicola Girt is in agreement with the Amicar rinse and requests that you order it. Once ordered, I will call patient at Dr Larwance Rote office request to advise patient of instructions and to get the medicine form pharmacy.

## 2019-11-27 NOTE — Telephone Encounter (Signed)
Patient has antiphospholipid antibody syndrome with high thrombotic risk. Coming off and on coumadin is a long drawn process. If she is getting 1 tooth extracted, it is a low risk procedure for bleeding. I would prefer if coumadin is not held for this procedure. We can give her amicar mouth wash before and for 1-2 days post procedure. That would be my preference. Please ask if that would be ok with DR. Nicola Girt

## 2019-11-28 ENCOUNTER — Encounter: Payer: Self-pay | Admitting: Oncology

## 2019-11-28 DIAGNOSIS — G890 Central pain syndrome: Secondary | ICD-10-CM | POA: Diagnosis not present

## 2019-11-28 DIAGNOSIS — R110 Nausea: Secondary | ICD-10-CM | POA: Diagnosis not present

## 2019-11-28 DIAGNOSIS — Z79891 Long term (current) use of opiate analgesic: Secondary | ICD-10-CM | POA: Diagnosis not present

## 2019-11-28 DIAGNOSIS — G894 Chronic pain syndrome: Secondary | ICD-10-CM | POA: Diagnosis not present

## 2019-11-28 LAB — PROTIME-INR: INR: 2.7 — AB (ref 0.9–1.1)

## 2019-11-28 NOTE — Telephone Encounter (Signed)
P/A submitted and is in review.

## 2019-11-28 NOTE — Telephone Encounter (Signed)
Amanda Vasquez, can you do this prior authorization

## 2019-11-29 NOTE — Telephone Encounter (Addendum)
Called to check status of P/A and it is still in review process which could take up to 72 hours.

## 2019-11-29 NOTE — Telephone Encounter (Signed)
I don't believe she wants to start the lovenox bridge. When I spoke to her yesterday she was not very excited to give herself injections. Do you want to call her and see what she wants to do? We can certainly still do the bridge or she can go to dentist on Monday for her cavity and wait to have her tooth pulled. Its completely up to her  Faythe Casa, NP 11/29/2019 12:25 PM

## 2019-11-29 NOTE — Telephone Encounter (Signed)
Patient will just get the cavity fixed Monday and plan for tooth extraction with Lovenox bridge at a later time.  She is calling to update her dentis.

## 2019-11-29 NOTE — Telephone Encounter (Signed)
Okay sounds good.  Faythe Casa, NP 11/29/2019 11:32 AM

## 2019-11-29 NOTE — Telephone Encounter (Signed)
Awesome! Thanks. Sorry for all the confusion.

## 2019-11-29 NOTE — Telephone Encounter (Signed)
The P/A has been denied.  She is scheduled for dental work on Monday.  Plan B?

## 2019-11-29 NOTE — Telephone Encounter (Signed)
No problem.

## 2019-11-30 IMAGING — CT CT CERVICAL SPINE W/O CM
4 of 7 series · 14 of 33 positions shown, 16 images · non-contrast
Comparison: 01/09/2018

CLINICAL DATA: Fall, hit back of head

EXAM:
CT HEAD WITHOUT CONTRAST
CT CERVICAL SPINE WITHOUT CONTRAST
TECHNIQUE: Multidetector CT imaging of the head and cervical spine was
performed following the standard protocol without intravenous
contrast. Multiplanar CT image reconstructions of the cervical spine
were also generated.

[Series 7: c spine soft · axial · 0.32mm/px · z∈[-265,-185]mm · 3 of 81 slices shown]
[im 21/81  soft-tissue]
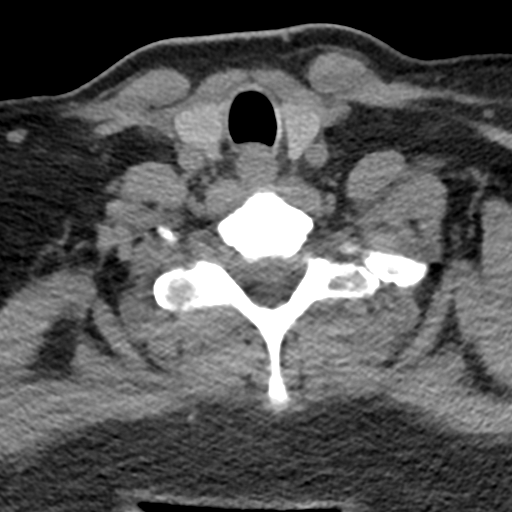
[im 41/81  soft-tissue]
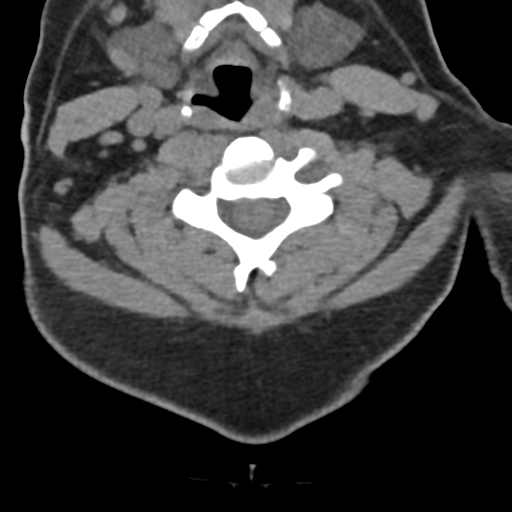
[im 61/81  soft-tissue]
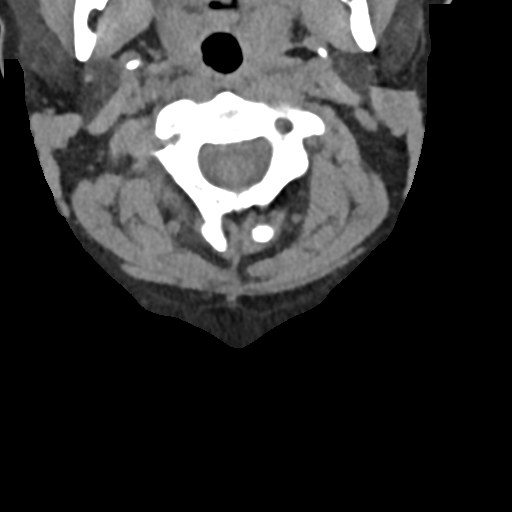

[Series 10: sagittal bone · sagittal · 0.28mm/px · 5 of 60 slices shown]
[im 10/60  bone]
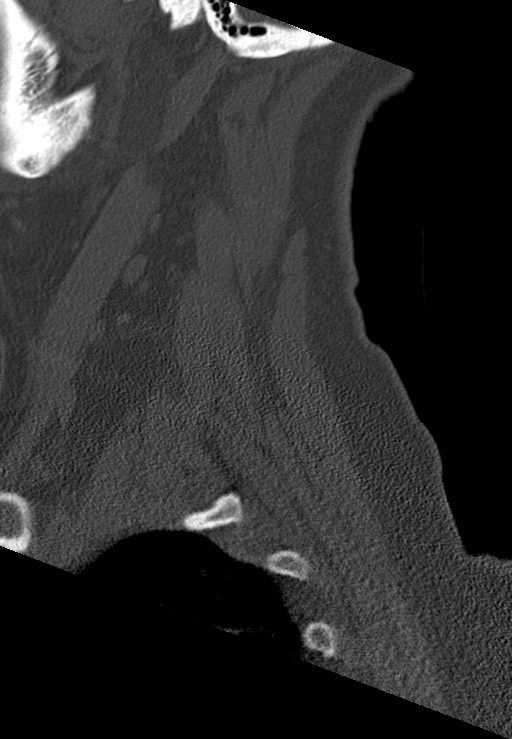
[im 20/60  bone]
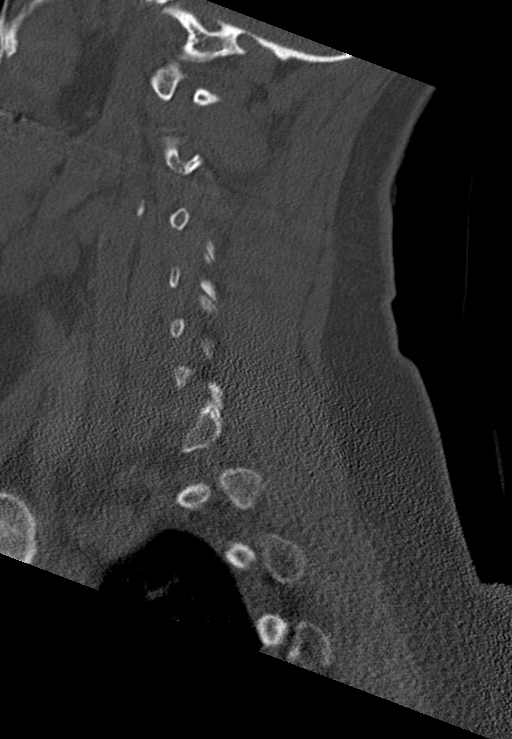
[im 30/60  bone]
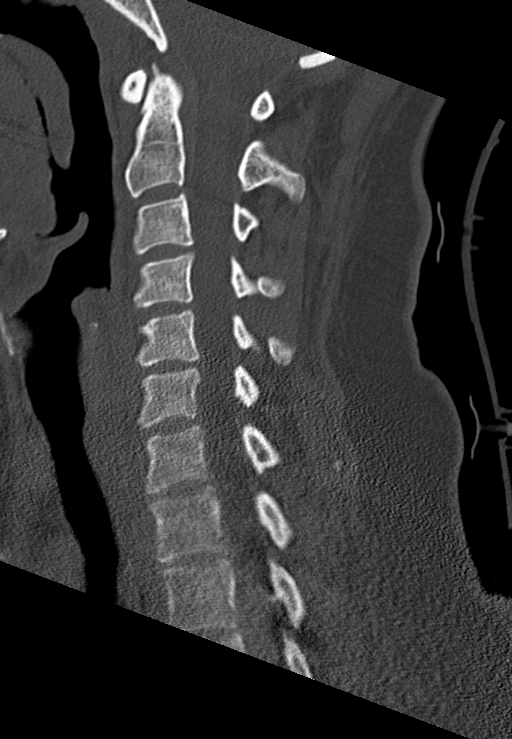
[im 40/60  bone]
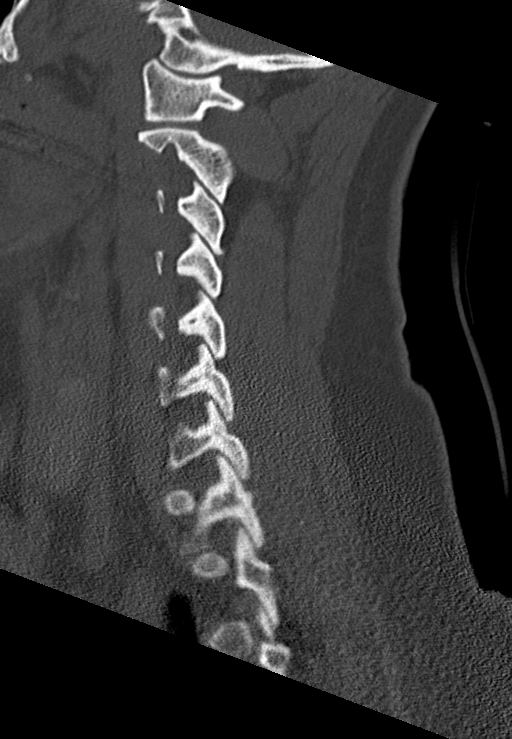
[im 50/60  bone]
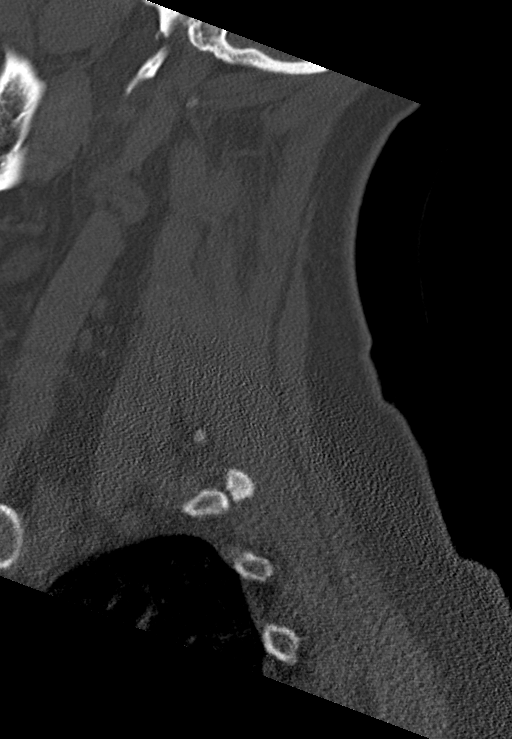

[Series 11: coronal bone · coronal · 0.26mm/px · 1 of 58 slices shown]
[im 29/58  bone]
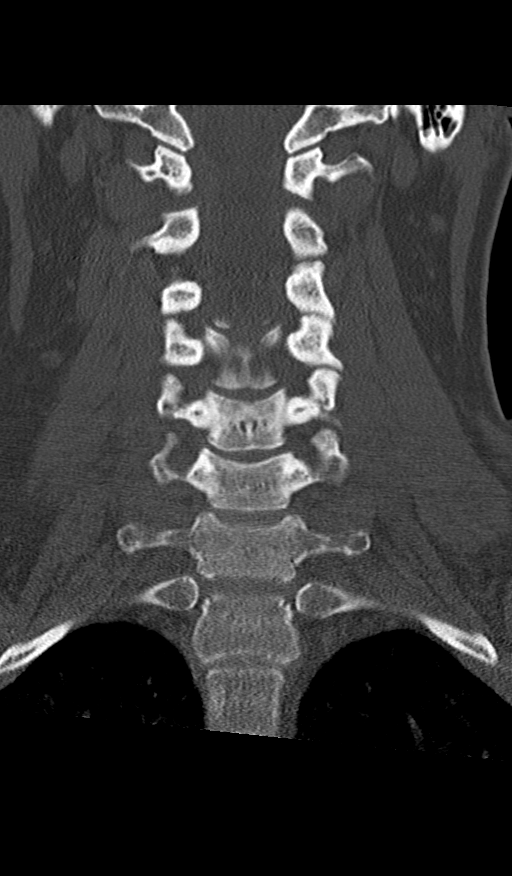

[Series 12: orthogonal bone · axial · 0.25mm/px · z∈[-329,-204]mm · 5 of 103 slices shown, 7 images]
[im 18/103  soft-tissue]
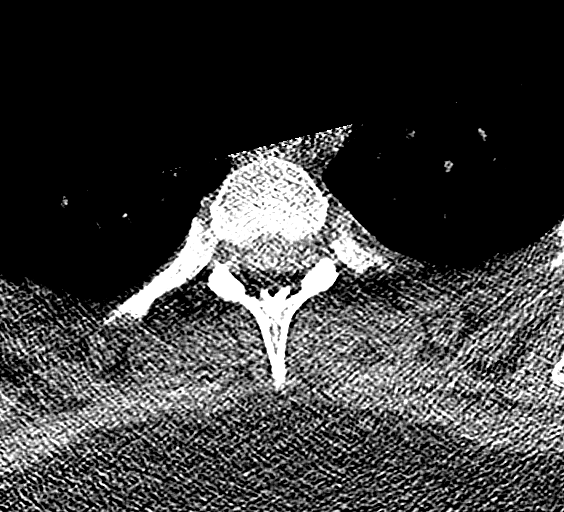
[im 18/103  bone]
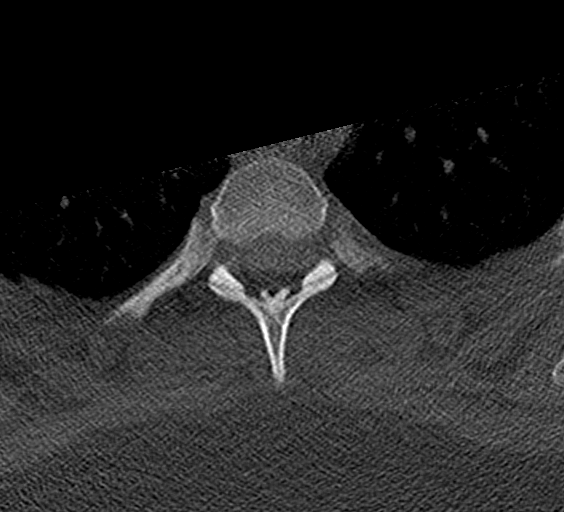
[im 35/103  bone]
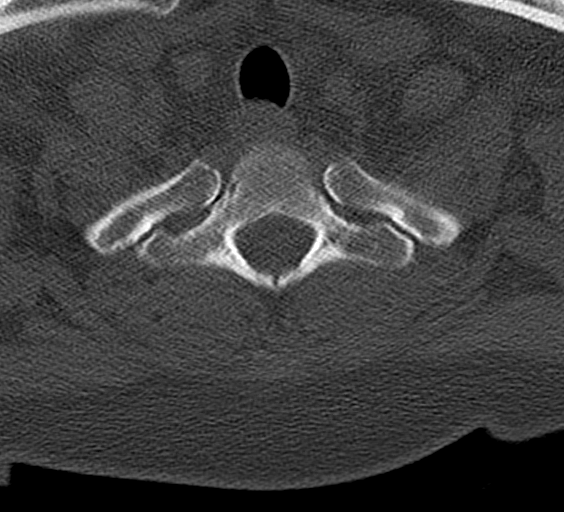
[im 52/103  bone]
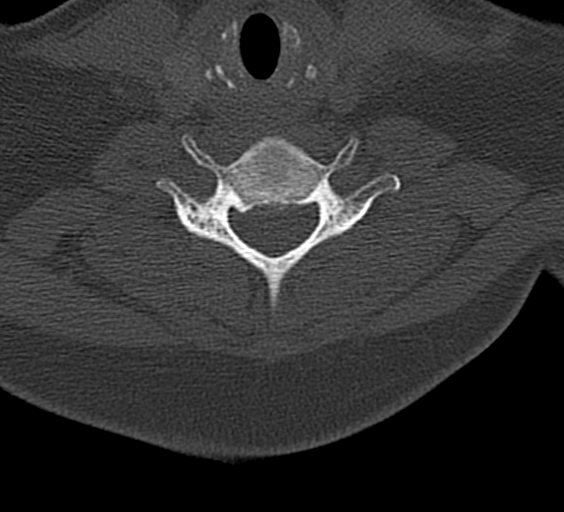
[im 69/103  bone]
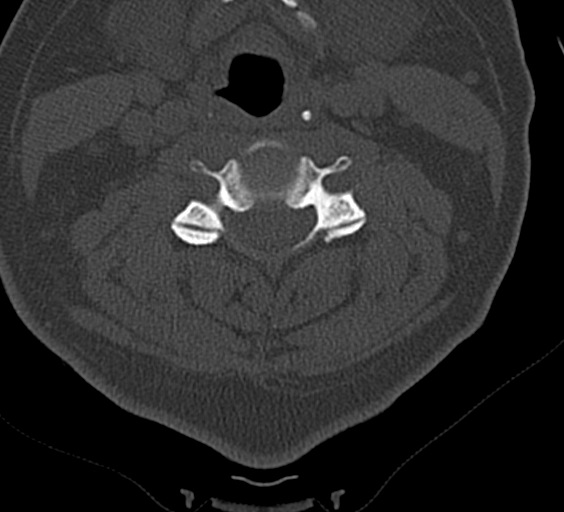
[im 86/103  soft-tissue]
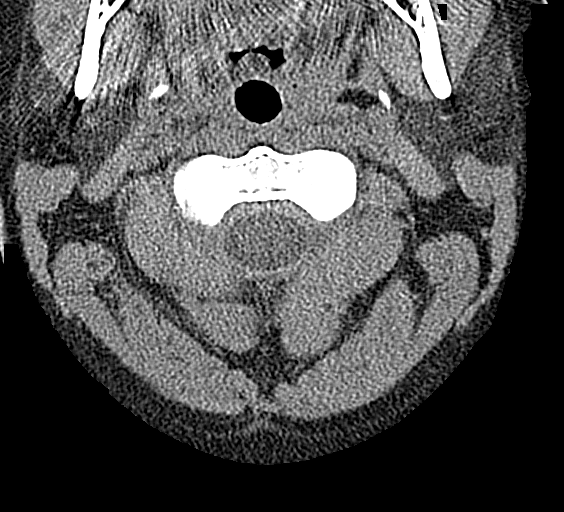
[im 86/103  bone]
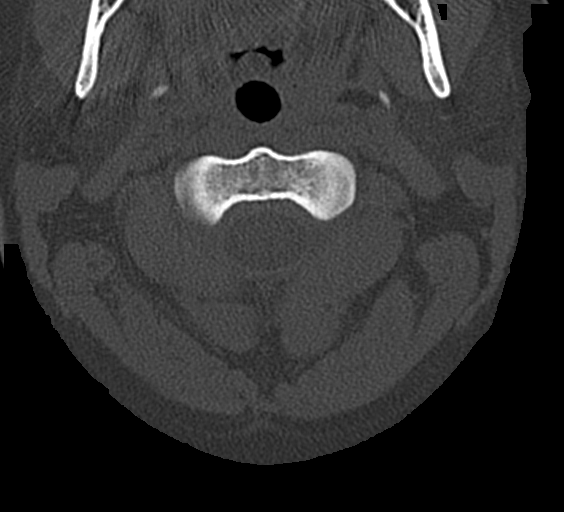

[14 of 33 positions shown; findings below may reference images not displayed]

FINDINGS: CT HEAD FINDINGS

Brain: No acute intracranial abnormality. Specifically, no
hemorrhage, hydrocephalus, mass lesion, acute infarction, or
significant intracranial injury.

Vascular: No hyperdense vessel or unexpected calcification.

Skull: No acute calvarial abnormality.

Sinuses/Orbits: Visualized paranasal sinuses and mastoids clear.
Orbital soft tissues unremarkable.

Other: Soft tissue swelling posteriorly over the occipital region.

CT CERVICAL SPINE FINDINGS

Alignment: Normal alignment.

Skull base and vertebrae: No acute fracture. No primary bone lesion
or focal pathologic process.

Soft tissues and spinal canal: No prevertebral fluid or swelling. No
visible canal hematoma.

Disc levels:  Maintained

Upper chest: Negative

Other: None
IMPRESSION: No acute intracranial abnormality.

No bony abnormality in the cervical spine.

## 2019-12-03 ENCOUNTER — Encounter: Payer: Self-pay | Admitting: Oncology

## 2019-12-03 ENCOUNTER — Ambulatory Visit: Payer: Medicare Other | Attending: Internal Medicine

## 2019-12-03 ENCOUNTER — Inpatient Hospital Stay (HOSPITAL_BASED_OUTPATIENT_CLINIC_OR_DEPARTMENT_OTHER): Payer: Medicare Other | Admitting: Oncology

## 2019-12-03 ENCOUNTER — Inpatient Hospital Stay: Payer: Medicare Other

## 2019-12-03 DIAGNOSIS — Z5181 Encounter for therapeutic drug level monitoring: Secondary | ICD-10-CM

## 2019-12-03 DIAGNOSIS — Z7901 Long term (current) use of anticoagulants: Secondary | ICD-10-CM | POA: Diagnosis not present

## 2019-12-03 DIAGNOSIS — D6861 Antiphospholipid syndrome: Secondary | ICD-10-CM | POA: Diagnosis not present

## 2019-12-03 DIAGNOSIS — Z23 Encounter for immunization: Secondary | ICD-10-CM

## 2019-12-03 LAB — PROTIME-INR: INR: 3.3 — AB (ref 0.9–1.1)

## 2019-12-03 NOTE — Progress Notes (Signed)
   Covid-19 Vaccination Clinic  Name:  Amanda Vasquez    MRN: [id number] DOB: [birth year]  12/03/2019  [name] was observed post Covid-19 immunization for 15 minutes without incident. She was provided with Vaccine Information Sheet and instruction to access the V-Safe system.   [name] was instructed to call 911 with any severe reactions post vaccine: [name] Difficulty breathing  . Swelling of face and throat  . A fast heartbeat  . A bad rash all over body  . Dizziness and weakness   Immunizations Administered    Name Date Dose VIS Date Route   Moderna COVID-19 Vaccine 12/03/2019 11:37 AM 0.5 mL 07/23/2019 Intramuscular   Manufacturer: Moderna   Lot: [id number]   [id number]

## 2019-12-03 NOTE — Progress Notes (Signed)
Patient is asking about introducing greens into her diet is possible

## 2019-12-05 ENCOUNTER — Other Ambulatory Visit: Payer: Self-pay | Admitting: Neurology

## 2019-12-05 DIAGNOSIS — G43711 Chronic migraine without aura, intractable, with status migrainosus: Secondary | ICD-10-CM
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
## 2019-12-10 ENCOUNTER — Encounter: Payer: Self-pay | Admitting: Oncology

## 2019-12-11 ENCOUNTER — Encounter: Payer: Self-pay | Admitting: Oncology

## 2019-12-11 LAB — PROTIME-INR: INR: 3.1 — AB (ref 0.9–1.1)

## 2019-12-17 ENCOUNTER — Telehealth: Payer: Self-pay | Admitting: *Deleted

## 2019-12-17 DIAGNOSIS — D6861 Antiphospholipid syndrome: Secondary | ICD-10-CM | POA: Diagnosis not present

## 2019-12-17 DIAGNOSIS — Z7901 Long term (current) use of anticoagulants: Secondary | ICD-10-CM | POA: Diagnosis not present

## 2019-12-17 NOTE — Telephone Encounter (Signed)
I made phone call about pt and gave instructions for coumadin and new check of lab

## 2019-12-17 NOTE — Telephone Encounter (Signed)
Patient called reporting that hher at home INR is 4.2 today and she even ate broccoli last night and also took her 10 mg coumadin last evening. She is asking for a return call to discuss what to do [phone]

## 2019-12-17 NOTE — Telephone Encounter (Signed)
Called pt and told her to hold coumadin for today. Then go back on coumadin alternating 7.5 mg 4 days a week, and 10 mg 3 days a week. Repeat coumadin check in 1 week and call us with results because pt does them at home. Pt agreeable to plan

## 2019-12-18 ENCOUNTER — Encounter: Payer: Self-pay | Admitting: Oncology

## 2019-12-18 LAB — PROTIME-INR: INR: 4.1 — AB (ref 0.9–1.1)

## 2019-12-24 ENCOUNTER — Encounter: Payer: Self-pay | Admitting: Oncology

## 2019-12-25 ENCOUNTER — Encounter: Payer: Self-pay | Admitting: Oncology

## 2019-12-25 LAB — PROTIME-INR: INR: 1.6 — AB (ref 0.9–1.1)

## 2019-12-26 DIAGNOSIS — G894 Chronic pain syndrome: Secondary | ICD-10-CM | POA: Diagnosis not present

## 2019-12-26 DIAGNOSIS — G890 Central pain syndrome: Secondary | ICD-10-CM | POA: Diagnosis not present

## 2019-12-26 DIAGNOSIS — Z79891 Long term (current) use of opiate analgesic: Secondary | ICD-10-CM | POA: Diagnosis not present

## 2019-12-26 DIAGNOSIS — R110 Nausea: Secondary | ICD-10-CM | POA: Diagnosis not present

## 2019-12-30 ENCOUNTER — Telehealth: Payer: Self-pay | Admitting: *Deleted

## 2019-12-30 NOTE — Telephone Encounter (Signed)
Patient has a Botox appointment on 01/14/2020.  I called [employer] [phone] and spoke to [place].  She states [phone] and [phone] are valid and billable.  They do not require PA.  Ref# for call is [id number].

## 2020-01-01 ENCOUNTER — Encounter: Payer: Self-pay | Admitting: Oncology

## 2020-01-01 ENCOUNTER — Encounter: Payer: Self-pay | Admitting: *Deleted

## 2020-01-01 LAB — PROTIME-INR: INR: 5.2 — AB (ref 0.9–1.1)

## 2020-01-08 ENCOUNTER — Encounter: Payer: Self-pay | Admitting: Oncology

## 2020-01-08 LAB — PROTIME-INR: INR: 1.1 (ref 0.9–1.1)

## 2020-01-13 NOTE — Progress Notes (Signed)
Consent Form Botulism Toxin Injection For Chronic Migraine   Interval history 07/09/2019: Continues to do extremely well > 75% decrease freq headaches. +n.  Reviewed orally with patient, additionally signature is on file:  Botulism toxin has been approved by the Federal drug administration for treatment of chronic migraine. Botulism toxin does not cure chronic migraine and it may not be effective in some patients.  The administration of botulism toxin is accomplished by injecting a small amount of toxin into the muscles of the neck and head. Dosage must be titrated for each individual. Any benefits resulting from botulism toxin tend to wear off after 3 months with a repeat injection required if benefit is to be maintained. Injections are usually done every 3-4 months with maximum effect peak achieved by about 2 or 3 weeks. Botulism toxin is expensive and you should be sure of what costs you will incur resulting from the injection.  The side effects of botulism toxin use for chronic migraine may include:   -Transient, and usually mild, facial weakness with facial injections  -Transient, and usually mild, head or neck weakness with head/neck injections  -Reduction or loss of forehead facial animation due to forehead muscle weakness  -Eyelid drooping  -Dry eye  -Pain at the site of injection or bruising at the site of injection  -Double vision  -Potential unknown long term risks  Contraindications: You should not have Botox if you are pregnant, nursing, allergic to albumin, have an infection, skin condition, or muscle weakness at the site of the injection, or have myasthenia gravis, Lambert-Eaton syndrome, or ALS.  It is also possible that as with any injection, there may be an allergic reaction or no effect from the medication. Reduced effectiveness after repeated injections is sometimes seen and rarely infection at the injection site may occur. All care will be taken to prevent these side  effects. If therapy is given over a long time, atrophy and wasting in the muscle injected may occur. Occasionally the patient's become refractory to treatment because they develop antibodies to the toxin. In this event, therapy needs to be modified.  I have read the above information and consent to the administration of botulism toxin.    BOTOX PROCEDURE NOTE FOR MIGRAINE HEADACHE    Contraindications and precautions discussed with patient(above). Aseptic procedure was observed and patient tolerated procedure. Procedure performed by Dr. Georgia Dom  The condition has existed for more than 6 months, and pt does not have a diagnosis of ALS, Myasthenia Gravis or Lambert-Eaton Syndrome.  Risks and benefits of injections discussed and pt agrees to proceed with the procedure.  Written consent obtained  These injections are medically necessary. Pt  receives good benefits from these injections. These injections do not cause sedations or hallucinations which the oral therapies may cause.  Description of procedure:  The patient was placed in a sitting position. The standard protocol was used for Botox as follows, with 5 units of Botox injected at each site:   -Procerus muscle, midline injection  -Corrugator muscle, bilateral injection  -Frontalis muscle, bilateral injection, with 2 sites each side, medial injection was performed in the upper one third of the frontalis muscle, in the region vertical from the medial inferior edge of the superior orbital rim. The lateral injection was again in the upper one third of the forehead vertically above the lateral limbus of the cornea, 1.5 cm lateral to the medial injection site.    -Temporalis muscle injection, 5 sites, bilaterally. The first injection  was 3 cm above the tragus of the ear, second injection site was 1.5 cm to 3 cm up from the first injection site in line with the tragus of the ear. The third injection site was 1.5-3 cm forward between the  first 2 injection sites. The fourth injection site was 1.5 cm posterior to the second injection site.   -Occipitalis muscle injection, 3 sites, bilaterally. The first injection was done one half way between the occipital protuberance and the tip of the mastoid process behind the ear. The second injection site was done lateral and superior to the first, 1 fingerbreadth from the first injection. The third injection site was 1 fingerbreadth superiorly and medially from the first injection site.  -Cervical paraspinal muscle injection, 2 sites, bilateral knee first injection site was 1 cm from the midline of the cervical spine, 3 cm inferior to the lower border of the occipital protuberance. The second injection site was 1.5 cm superiorly and laterally to the first injection site.  -Trapezius muscle injection was performed at 3 sites, bilaterally. The first injection site was in the upper trapezius muscle halfway between the inflection point of the neck, and the acromion. The second injection site was one half way between the acromion and the first injection site. The third injection was done between the first injection site and the inflection point of the neck.   Will return for repeat injection in 3 months.   A 200 unit sof Botox was used, any Botox not injected was wasted. The patient tolerated the procedure well, there were no complications of the above procedure.  Performed by Dr. Jaynee Eagles M.D. 72ml Lidocaine 1%, 30-gauge needle was used. All procedures a documented were medically necessary, reasonable and appropriate based on the patient's history, medical diagnosis and physician opinion. Verbal informed consent was obtained from the patient, patient was informed of potential risk of procedure, including bruising, bleeding, hematoma formation, infection, muscle weakness, muscle pain, numbness, transient hypertension, transient hyperglycemia and transient insomnia among others. All areas injected were  topically clean with isopropyl rubbing alcohol. Nonsterile nonlatex gloves were worn during the procedure.  Left pectoralis 3 injection sites based on palpation and pain. Used emg machine to target the muscle. Patient tolerated well no side effects. Felt better after procedure. Called her this evening, no side effects.

## 2020-01-14 ENCOUNTER — Ambulatory Visit: Payer: Medicare Other | Admitting: Neurology

## 2020-01-14 ENCOUNTER — Other Ambulatory Visit: Payer: Self-pay

## 2020-01-14 ENCOUNTER — Encounter: Payer: Self-pay | Admitting: Oncology

## 2020-01-14 DIAGNOSIS — G43711 Chronic migraine without aura, intractable, with status migrainosus: Secondary | ICD-10-CM | POA: Diagnosis not present

## 2020-01-14 DIAGNOSIS — Z7901 Long term (current) use of anticoagulants: Secondary | ICD-10-CM | POA: Diagnosis not present

## 2020-01-14 DIAGNOSIS — D6861 Antiphospholipid syndrome: Secondary | ICD-10-CM | POA: Diagnosis not present

## 2020-01-14 NOTE — Progress Notes (Signed)
Botox- 200 units x 1 vial Lot: [id number] Expiration: 09/2022 NDC: 0023-3921-02  Bacteriostatic 0.9% Sodium Chloride- 4mL total Lot: [id number] Expiration: 02/20/2020 NDC: 0409-1966-02  Dx: G43.711 B/B  

## 2020-01-15 ENCOUNTER — Encounter: Payer: Self-pay | Admitting: Oncology

## 2020-01-15 LAB — PROTIME-INR: INR: 1.6 — AB (ref 0.9–1.1)

## 2020-01-17 ENCOUNTER — Other Ambulatory Visit: Payer: Self-pay

## 2020-01-21 ENCOUNTER — Ambulatory Visit (INDEPENDENT_AMBULATORY_CARE_PROVIDER_SITE_OTHER): Payer: Medicare Other | Admitting: Internal Medicine

## 2020-01-21 ENCOUNTER — Encounter: Payer: Self-pay | Admitting: Oncology

## 2020-01-21 ENCOUNTER — Encounter: Payer: Self-pay | Admitting: Internal Medicine

## 2020-01-21 ENCOUNTER — Other Ambulatory Visit: Payer: Self-pay

## 2020-01-21 VITALS — BP 90/60 | HR 83 | Temp 97.2°F | Ht 65.0 in | Wt 209.0 lb

## 2020-01-21 DIAGNOSIS — F419 Anxiety disorder, unspecified: Secondary | ICD-10-CM

## 2020-01-21 DIAGNOSIS — E6609 Other obesity due to excess calories: Secondary | ICD-10-CM

## 2020-01-21 DIAGNOSIS — E785 Hyperlipidemia, unspecified: Secondary | ICD-10-CM | POA: Diagnosis not present

## 2020-01-21 DIAGNOSIS — Z1329 Encounter for screening for other suspected endocrine disorder: Secondary | ICD-10-CM

## 2020-01-21 DIAGNOSIS — Z6834 Body mass index (BMI) 34.0-34.9, adult: Secondary | ICD-10-CM

## 2020-01-21 DIAGNOSIS — I2782 Chronic pulmonary embolism: Secondary | ICD-10-CM

## 2020-01-21 DIAGNOSIS — I959 Hypotension, unspecified: Secondary | ICD-10-CM | POA: Diagnosis not present

## 2020-01-21 DIAGNOSIS — E1165 Type 2 diabetes mellitus with hyperglycemia: Secondary | ICD-10-CM

## 2020-01-21 DIAGNOSIS — M25561 Pain in right knee: Secondary | ICD-10-CM | POA: Diagnosis not present

## 2020-01-21 DIAGNOSIS — E66811 Obesity, class 1: Secondary | ICD-10-CM

## 2020-01-21 DIAGNOSIS — F329 Major depressive disorder, single episode, unspecified: Secondary | ICD-10-CM

## 2020-01-21 DIAGNOSIS — E669 Obesity, unspecified: Secondary | ICD-10-CM

## 2020-01-21 DIAGNOSIS — F32A Depression, unspecified: Secondary | ICD-10-CM

## 2020-01-21 DIAGNOSIS — G8929 Other chronic pain: Secondary | ICD-10-CM

## 2020-01-21 DIAGNOSIS — I2609 Other pulmonary embolism with acute cor pulmonale: Secondary | ICD-10-CM

## 2020-01-21 LAB — CBC WITH DIFFERENTIAL/PLATELET
Basophils Absolute: 0 10*3/uL (ref 0.0–0.1)
Basophils Relative: 0.5 % (ref 0.0–3.0)
Eosinophils Absolute: 0.2 10*3/uL (ref 0.0–0.7)
Eosinophils Relative: 4.2 % (ref 0.0–5.0)
HCT: 40.2 % (ref 36.0–46.0)
Hemoglobin: 13.3 g/dL (ref 12.0–15.0)
Lymphocytes Relative: 39.6 % (ref 12.0–46.0)
Lymphs Abs: 2.1 10*3/uL (ref 0.7–4.0)
MCHC: 33 g/dL (ref 30.0–36.0)
MCV: 86 fl (ref 78.0–100.0)
Monocytes Absolute: 0.3 10*3/uL (ref 0.1–1.0)
Monocytes Relative: 6.3 % (ref 3.0–12.0)
Neutro Abs: 2.6 10*3/uL (ref 1.4–7.7)
Neutrophils Relative %: 49.4 % (ref 43.0–77.0)
Platelets: 244 10*3/uL (ref 150.0–400.0)
RBC: 4.67 Mil/uL (ref 3.87–5.11)
RDW: 15 % (ref 11.5–15.5)
WBC: 5.3 10*3/uL (ref 4.0–10.5)

## 2020-01-21 LAB — COMPREHENSIVE METABOLIC PANEL
ALT: 51 U/L — ABNORMAL HIGH (ref 0–35)
AST: 46 U/L — ABNORMAL HIGH (ref 0–37)
Albumin: 4.3 g/dL (ref 3.5–5.2)
Alkaline Phosphatase: 59 U/L (ref 39–117)
BUN: 32 mg/dL — ABNORMAL HIGH (ref 6–23)
CO2: 28 mEq/L (ref 19–32)
Calcium: 9.7 mg/dL (ref 8.4–10.5)
Chloride: 100 mEq/L (ref 96–112)
Creatinine, Ser: 0.74 mg/dL (ref 0.40–1.20)
GFR: 83.12 mL/min (ref >60.00)
Glucose, Bld: 152 mg/dL — ABNORMAL HIGH (ref 70–99)
Potassium: 4.5 mEq/L (ref 3.5–5.1)
Sodium: 135 mEq/L (ref 135–145)
Total Bilirubin: 0.3 mg/dL (ref 0.2–1.2)
Total Protein: 6.7 g/dL (ref 6.0–8.3)

## 2020-01-21 LAB — LIPID PANEL
Cholesterol: 156 mg/dL (ref 0–200)
HDL: 66.2 mg/dL (ref >39.00)
LDL Cholesterol: 50 mg/dL (ref 0–99)
NonHDL: 89.41
Total CHOL/HDL Ratio: 2
Triglycerides: 196 mg/dL — ABNORMAL HIGH (ref 0.0–149.0)
VLDL: 39.2 mg/dL (ref 0.0–40.0)

## 2020-01-21 LAB — PROTIME-INR: INR: 2.6 — AB (ref 0.9–1.1)

## 2020-01-21 LAB — HEMOGLOBIN A1C: Hgb A1c MFr Bld: 7.3 % — ABNORMAL HIGH (ref 4.6–6.5)

## 2020-01-21 LAB — TSH: TSH: 1.53 u[IU]/mL (ref 0.35–4.50)

## 2020-01-21 MED ORDER — METFORMIN HCL 1000 MG PO TABS: 1000.0000 mg | ORAL_TABLET | Freq: Two times a day (BID) | ORAL | 3 refills | Status: DC
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
## 2020-01-21 NOTE — Patient Instructions (Addendum)
[facility] 2 female GI doctors  Dr. Tillie Rung [facility] or [facility])   Vitamin K Foods and Warfarin Warfarin is a blood thinner (anticoagulant). Anticoagulant medicines help prevent the formation of blood clots. These medicines work by decreasing the activity of vitamin K, which promotes normal blood clotting. When you take warfarin, problems can occur from suddenly increasing or decreasing the amount of vitamin K that you eat from one day to the next. Problems may include:  Blood clots.  Bleeding. What general guidelines do I need to follow? To avoid problems when taking warfarin:  Eat a balanced diet that includes: ? Fresh fruits and vegetables. ? Whole grains. ? Low-fat dairy products. ? Lean proteins, such as fish, eggs, and lean cuts of meat.  Keep your intake of vitamin K consistent from day to day. To do this: ? Avoid eating large amounts of vitamin K one day and low amounts of vitamin K the next day. ? If you take a multivitamin that contains vitamin K, be sure to take it every day. ? Know which foods contain vitamin K. Use the lists below to understand serving sizes and the amount of vitamin K in one serving.  Avoid major changes in your diet. If you are going to change your diet, talk with your health care provider before making changes.  Work with a Financial planner (dietitian) to develop a meal plan that works best for you.  High vitamin K foods Foods that are high in vitamin K contain more than 100 mcg (micrograms) per serving. These include:  Broccoli (cooked) -  cup has 110 mcg.  Brussels sprouts (cooked) -  cup has 109 mcg.  Greens, beet (cooked) -  cup has 350 mcg.  Greens, collard (cooked) -  cup has 418 mcg.  Greens, turnip (cooked) -  cup has 265 mcg.  Green onions or scallions -  cup has 105 mcg.  Kale (fresh or frozen) -  cup has 531 mcg.  Parsley (raw) - 10 sprigs has 164 mcg.  Spinach (cooked) -  cup has 444 mcg.  Swiss chard  (cooked) -  cup has 287 mcg. Moderate vitamin K foods Foods that have a moderate amount of vitamin K contain 25-100 mcg per serving. These include:  Asparagus (cooked) - 5 spears have 38 mcg.  Black-eyed peas (dried) -  cup has 32 mcg.  Cabbage (cooked) -  cup has 37 mcg.  Kiwi fruit - 1 medium has 31 mcg.  Lettuce - 1 cup has 57-63 mcg.  Okra (frozen) -  cup has 44 mcg.  Prunes (dried) - 5 prunes have 25 mcg.  Watercress (raw) - 1 cup has 85 mcg. Low vitamin K foods Foods low in vitamin K contain less than 25 mcg per serving. These include:  Artichoke - 1 medium has 18 mcg.  Avocado - 1 oz. has 6 mcg.  Blueberries -  cup has 14 mcg.  Cabbage (raw) -  cup has 21 mcg.  Carrots (cooked) -  cup has 11 mcg.  Cauliflower (raw) -  cup has 11 mcg.  Cucumber with peel (raw) -  cup has 9 mcg.  Grapes -  cup has 12 mcg.  Mango - 1 medium has 9 mcg.  Nuts - 1 oz. has 15 mcg.  Pear - 1 medium has 8 mcg.  Peas (cooked) -  cup has 19 mcg.  Pickles - 1 spear has 14 mcg.  Pumpkin seeds - 1 oz. has 13 mcg.  Sauerkraut (canned) -  cup has 16 mcg.  Soybeans (cooked) -  cup has 16 mcg.  Tomato (raw) - 1 medium has 10 mcg.  Tomato sauce -  cup has 17 mcg. Vitamin K-free foods If a food contain less than 5 mcg per serving, it is considered to have no vitamin K. These foods include:  Bread and cereal products.  Cheese.  Eggs.  Fish and shellfish.  Meat and poultry.  Milk and dairy products.  Sunflower seeds. Actual amounts of vitamin K in foods may be different depending on processing. Talk with your dietitian about what foods you can eat and what foods you should avoid. This information is not intended to replace advice given to you by your health care provider. Make sure you discuss any questions you have with your health care provider. Document Revised: 07/21/2017 Document Reviewed: 11/11/2015 Elsevier Patient Education  2020 [employer].

## 2020-01-22 DIAGNOSIS — E113293 Type 2 diabetes mellitus with mild nonproliferative diabetic retinopathy without macular edema, bilateral: Secondary | ICD-10-CM | POA: Diagnosis not present

## 2020-01-23 ENCOUNTER — Other Ambulatory Visit: Payer: Self-pay

## 2020-01-23 ENCOUNTER — Other Ambulatory Visit (INDEPENDENT_AMBULATORY_CARE_PROVIDER_SITE_OTHER): Payer: Medicare Other

## 2020-01-23 DIAGNOSIS — E1165 Type 2 diabetes mellitus with hyperglycemia: Secondary | ICD-10-CM

## 2020-01-23 DIAGNOSIS — G890 Central pain syndrome: Secondary | ICD-10-CM | POA: Diagnosis not present

## 2020-01-23 DIAGNOSIS — G894 Chronic pain syndrome: Secondary | ICD-10-CM | POA: Diagnosis not present

## 2020-01-23 DIAGNOSIS — R110 Nausea: Secondary | ICD-10-CM | POA: Diagnosis not present

## 2020-01-23 DIAGNOSIS — Z79891 Long term (current) use of opiate analgesic: Secondary | ICD-10-CM | POA: Diagnosis not present

## 2020-01-23 DIAGNOSIS — I959 Hypotension, unspecified: Secondary | ICD-10-CM | POA: Diagnosis not present

## 2020-01-23 DIAGNOSIS — E113293 Type 2 diabetes mellitus with mild nonproliferative diabetic retinopathy without macular edema, bilateral: Secondary | ICD-10-CM | POA: Diagnosis not present

## 2020-01-23 LAB — CORTISOL: Cortisol, Plasma: 22.3 ug/dL

## 2020-01-24 ENCOUNTER — Other Ambulatory Visit: Payer: Self-pay | Admitting: Internal Medicine

## 2020-01-24 ENCOUNTER — Telehealth: Payer: Self-pay

## 2020-01-24 ENCOUNTER — Telehealth: Payer: Self-pay | Admitting: Internal Medicine

## 2020-01-24 DIAGNOSIS — R7989 Other specified abnormal findings of blood chemistry: Secondary | ICD-10-CM

## 2020-01-24 LAB — URINALYSIS, ROUTINE W REFLEX MICROSCOPIC
Bilirubin Urine: NEGATIVE
Hgb urine dipstick: NEGATIVE
Ketones, ur: NEGATIVE
Leukocytes,Ua: NEGATIVE
Nitrite: NEGATIVE
Protein, ur: NEGATIVE
Specific Gravity, Urine: 1.015 (ref 1.001–1.03)
pH: 6 (ref 5.0–8.0)

## 2020-01-24 LAB — MICROALBUMIN / CREATININE URINE RATIO
Creatinine, Urine: 19 mg/dL — ABNORMAL LOW (ref 20–275)
Microalb, Ur: <0.2 mg/dL

## 2020-01-24 NOTE — Telephone Encounter (Signed)
Patient called & informed of results.

## 2020-01-24 NOTE — Telephone Encounter (Signed)
Pt returned for results

## 2020-01-24 NOTE — Telephone Encounter (Signed)
LMTCB for lab results.  

## 2020-01-28 ENCOUNTER — Encounter: Payer: Self-pay | Admitting: Oncology

## 2020-01-28 LAB — PROTIME-INR: INR: 2.1 — AB (ref 0.9–1.1)

## 2020-02-01 ENCOUNTER — Other Ambulatory Visit: Payer: Self-pay | Admitting: Neurology

## 2020-02-01 DIAGNOSIS — G43711 Chronic migraine without aura, intractable, with status migrainosus: Secondary | ICD-10-CM

## 2020-02-04 ENCOUNTER — Other Ambulatory Visit: Payer: Self-pay

## 2020-02-04 ENCOUNTER — Ambulatory Visit
Admission: RE | Admit: 2020-02-04 | Discharge: 2020-02-04 | Disposition: A | Discharge status: Home / Self Care | Payer: Medicare Other | Source: Ambulatory Visit | Attending: Internal Medicine | Admitting: Internal Medicine

## 2020-02-04 DIAGNOSIS — R7989 Other specified abnormal findings of blood chemistry: Secondary | ICD-10-CM | POA: Diagnosis not present

## 2020-02-04 DIAGNOSIS — R748 Abnormal levels of other serum enzymes: Secondary | ICD-10-CM | POA: Diagnosis not present

## 2020-02-05 ENCOUNTER — Encounter: Payer: Self-pay | Admitting: Oncology

## 2020-02-06 ENCOUNTER — Encounter: Payer: Self-pay | Admitting: Oncology

## 2020-02-06 LAB — PROTIME-INR: INR: 3.4 — AB (ref 0.9–1.1)

## 2020-02-11 ENCOUNTER — Encounter: Payer: Self-pay | Admitting: Oncology

## 2020-02-11 DIAGNOSIS — D6861 Antiphospholipid syndrome: Secondary | ICD-10-CM | POA: Diagnosis not present

## 2020-02-11 DIAGNOSIS — Z7901 Long term (current) use of anticoagulants: Secondary | ICD-10-CM | POA: Diagnosis not present

## 2020-02-12 ENCOUNTER — Encounter: Payer: Self-pay | Admitting: Oncology

## 2020-02-12 LAB — PROTIME-INR: INR: 2.5 — AB (ref 0.9–1.1)

## 2020-02-19 ENCOUNTER — Encounter: Payer: Self-pay | Admitting: Oncology

## 2020-02-19 LAB — PROTIME-INR: INR: 2.7 — AB (ref 0.9–1.1)

## 2020-02-20 DIAGNOSIS — G894 Chronic pain syndrome: Secondary | ICD-10-CM | POA: Diagnosis not present

## 2020-02-20 DIAGNOSIS — Z79891 Long term (current) use of opiate analgesic: Secondary | ICD-10-CM | POA: Diagnosis not present

## 2020-02-20 DIAGNOSIS — R110 Nausea: Secondary | ICD-10-CM | POA: Diagnosis not present

## 2020-02-20 DIAGNOSIS — G890 Central pain syndrome: Secondary | ICD-10-CM | POA: Diagnosis not present

## 2020-02-25 ENCOUNTER — Encounter: Payer: Self-pay | Admitting: Internal Medicine

## 2020-02-26 ENCOUNTER — Encounter: Payer: Self-pay | Admitting: Oncology

## 2020-02-26 LAB — PROTIME-INR: INR: 3.5 — AB (ref 0.9–1.1)

## 2020-03-04 ENCOUNTER — Encounter: Payer: Self-pay | Admitting: Oncology

## 2020-03-05 ENCOUNTER — Encounter: Payer: Self-pay | Admitting: Oncology

## 2020-03-05 LAB — PROTIME-INR: INR: 1.8 — AB (ref 0.9–1.1)

## 2020-03-08 ENCOUNTER — Other Ambulatory Visit: Payer: Self-pay | Admitting: Internal Medicine

## 2020-03-08 DIAGNOSIS — K3184 Gastroparesis: Secondary | ICD-10-CM

## 2020-03-08 MED ORDER — METOCLOPRAMIDE HCL 5 MG PO TABS: ORAL_TABLET | ORAL | 2 refills | Status: DC

## 2020-03-11 DIAGNOSIS — Z7901 Long term (current) use of anticoagulants: Secondary | ICD-10-CM | POA: Diagnosis not present

## 2020-03-11 DIAGNOSIS — D6861 Antiphospholipid syndrome: Secondary | ICD-10-CM | POA: Diagnosis not present

## 2020-03-12 ENCOUNTER — Encounter: Payer: Self-pay | Admitting: Oncology

## 2020-03-12 LAB — PROTIME-INR: INR: 1.8 — AB (ref 0.9–1.1)

## 2020-03-13 ENCOUNTER — Encounter: Payer: Self-pay | Admitting: *Deleted

## 2020-03-18 DIAGNOSIS — H1045 Other chronic allergic conjunctivitis: Secondary | ICD-10-CM | POA: Diagnosis not present

## 2020-03-19 ENCOUNTER — Encounter: Payer: Self-pay | Admitting: Oncology

## 2020-03-19 DIAGNOSIS — Z79891 Long term (current) use of opiate analgesic: Secondary | ICD-10-CM | POA: Diagnosis not present

## 2020-03-19 DIAGNOSIS — G890 Central pain syndrome: Secondary | ICD-10-CM | POA: Diagnosis not present

## 2020-03-19 DIAGNOSIS — R110 Nausea: Secondary | ICD-10-CM | POA: Diagnosis not present

## 2020-03-19 DIAGNOSIS — G894 Chronic pain syndrome: Secondary | ICD-10-CM | POA: Diagnosis not present

## 2020-03-19 LAB — PROTIME-INR: INR: 2.3 — AB (ref 0.9–1.1)

## 2020-03-26 ENCOUNTER — Encounter: Payer: Self-pay | Admitting: Oncology

## 2020-03-26 LAB — PROTIME-INR: INR: 3.6 — AB (ref 0.9–1.1)

## 2020-03-27 ENCOUNTER — Other Ambulatory Visit: Payer: Self-pay | Admitting: Neurology

## 2020-03-27 ENCOUNTER — Other Ambulatory Visit: Payer: Self-pay | Admitting: Internal Medicine

## 2020-03-27 DIAGNOSIS — G43711 Chronic migraine without aura, intractable, with status migrainosus: Secondary | ICD-10-CM

## 2020-04-01 ENCOUNTER — Encounter: Payer: Self-pay | Admitting: Oncology

## 2020-04-01 LAB — PROTIME-INR: INR: 3.2 — AB (ref 0.9–1.1)

## 2020-04-07 ENCOUNTER — Other Ambulatory Visit: Payer: Self-pay | Admitting: Internal Medicine

## 2020-04-08 DIAGNOSIS — Z7901 Long term (current) use of anticoagulants: Secondary | ICD-10-CM | POA: Diagnosis not present

## 2020-04-08 DIAGNOSIS — D6861 Antiphospholipid syndrome: Secondary | ICD-10-CM | POA: Diagnosis not present

## 2020-04-09 ENCOUNTER — Encounter: Payer: Self-pay | Admitting: Oncology

## 2020-04-09 LAB — PROTIME-INR: INR: 2.6 — AB (ref 0.9–1.1)

## 2020-04-16 ENCOUNTER — Telehealth: Payer: Self-pay | Admitting: Neurology

## 2020-04-16 ENCOUNTER — Encounter: Payer: Self-pay | Admitting: Oncology

## 2020-04-16 LAB — PROTIME-INR: INR: 3.2 — AB (ref 0.9–1.1)

## 2020-04-16 MED ORDER — BOTOX 200 UNITS IJ SOLR: INTRAMUSCULAR | 2 refills | Status: DC

## 2020-04-16 NOTE — Telephone Encounter (Signed)
Done

## 2020-04-16 NOTE — Telephone Encounter (Signed)
Patient has appointment on 8/31. I called [employer] [phone]) and spoke with Merrilee Seashore to see if codes [phone] and [phone] require PA. He states they do not. Patient can be B/B or use [employer]. Reference [phone].  Romelle Starcher, can you send patient's Botox prescription to [facility]? She will be B/B for upcoming appointment but we will use SP for future injections.

## 2020-04-20 DIAGNOSIS — R110 Nausea: Secondary | ICD-10-CM | POA: Diagnosis not present

## 2020-04-20 DIAGNOSIS — G890 Central pain syndrome: Secondary | ICD-10-CM | POA: Diagnosis not present

## 2020-04-20 DIAGNOSIS — G894 Chronic pain syndrome: Secondary | ICD-10-CM | POA: Diagnosis not present

## 2020-04-20 DIAGNOSIS — Z79891 Long term (current) use of opiate analgesic: Secondary | ICD-10-CM | POA: Diagnosis not present

## 2020-04-20 NOTE — Progress Notes (Signed)
Consent Form Botulism Toxin Injection For Chronic Migraine   Interval history 04/21/2020: Continues to do extremely well > 75% decrease freq headaches. +a  Reviewed orally with patient, additionally signature is on file:  Botulism toxin has been approved by the Federal drug administration for treatment of chronic migraine. Botulism toxin does not cure chronic migraine and it may not be effective in some patients.  The administration of botulism toxin is accomplished by injecting a small amount of toxin into the muscles of the neck and head. Dosage must be titrated for each individual. Any benefits resulting from botulism toxin tend to wear off after 3 months with a repeat injection required if benefit is to be maintained. Injections are usually done every 3-4 months with maximum effect peak achieved by about 2 or 3 weeks. Botulism toxin is expensive and you should be sure of what costs you will incur resulting from the injection.  The side effects of botulism toxin use for chronic migraine may include:   -Transient, and usually mild, facial weakness with facial injections  -Transient, and usually mild, head or neck weakness with head/neck injections  -Reduction or loss of forehead facial animation due to forehead muscle weakness  -Eyelid drooping  -Dry eye  -Pain at the site of injection or bruising at the site of injection  -Double vision  -Potential unknown long term risks  Contraindications: You should not have Botox if you are pregnant, nursing, allergic to albumin, have an infection, skin condition, or muscle weakness at the site of the injection, or have myasthenia gravis, Lambert-Eaton syndrome, or ALS.  It is also possible that as with any injection, there may be an allergic reaction or no effect from the medication. Reduced effectiveness after repeated injections is sometimes seen and rarely infection at the injection site may occur. All care will be taken to prevent these side  effects. If therapy is given over a long time, atrophy and wasting in the muscle injected may occur. Occasionally the patient's become refractory to treatment because they develop antibodies to the toxin. In this event, therapy needs to be modified.  I have read the above information and consent to the administration of botulism toxin.    BOTOX PROCEDURE NOTE FOR MIGRAINE HEADACHE    Contraindications and precautions discussed with patient(above). Aseptic procedure was observed and patient tolerated procedure. Procedure performed by Dr. Georgia Dom  The condition has existed for more than 6 months, and pt does not have a diagnosis of ALS, Myasthenia Gravis or Lambert-Eaton Syndrome.  Risks and benefits of injections discussed and pt agrees to proceed with the procedure.  Written consent obtained  These injections are medically necessary. Pt  receives good benefits from these injections. These injections do not cause sedations or hallucinations which the oral therapies may cause.  Description of procedure:  The patient was placed in a sitting position. The standard protocol was used for Botox as follows, with 5 units of Botox injected at each site:   -Procerus muscle, midline injection  -Corrugator muscle, bilateral injection  -Frontalis muscle, bilateral injection, with 2 sites each side, medial injection was performed in the upper one third of the frontalis muscle, in the region vertical from the medial inferior edge of the superior orbital rim. The lateral injection was again in the upper one third of the forehead vertically above the lateral limbus of the cornea, 1.5 cm lateral to the medial injection site.    -Temporalis muscle injection, 5 sites, bilaterally. The first injection  was 3 cm above the tragus of the ear, second injection site was 1.5 cm to 3 cm up from the first injection site in line with the tragus of the ear. The third injection site was 1.5-3 cm forward between the  first 2 injection sites. The fourth injection site was 1.5 cm posterior to the second injection site.   -Occipitalis muscle injection, 3 sites, bilaterally. The first injection was done one half way between the occipital protuberance and the tip of the mastoid process behind the ear. The second injection site was done lateral and superior to the first, 1 fingerbreadth from the first injection. The third injection site was 1 fingerbreadth superiorly and medially from the first injection site.  -Cervical paraspinal muscle injection, 2 sites, bilateral knee first injection site was 1 cm from the midline of the cervical spine, 3 cm inferior to the lower border of the occipital protuberance. The second injection site was 1.5 cm superiorly and laterally to the first injection site.  -Trapezius muscle injection was performed at 3 sites, bilaterally. The first injection site was in the upper trapezius muscle halfway between the inflection point of the neck, and the acromion. The second injection site was one half way between the acromion and the first injection site. The third injection was done between the first injection site and the inflection point of the neck.   Will return for repeat injection in 3 months.   A 200 unit sof Botox was used, any Botox not injected was wasted. The patient tolerated the procedure well, there were no complications of the above procedure.  Performed by Dr. Jaynee Eagles M.D. 72ml Lidocaine 1%, 30-gauge needle was used. All procedures a documented were medically necessary, reasonable and appropriate based on the patient's history, medical diagnosis and physician opinion. Verbal informed consent was obtained from the patient, patient was informed of potential risk of procedure, including bruising, bleeding, hematoma formation, infection, muscle weakness, muscle pain, numbness, transient hypertension, transient hyperglycemia and transient insomnia among others. All areas injected were  topically clean with isopropyl rubbing alcohol. Nonsterile nonlatex gloves were worn during the procedure.  Left pectoralis 3 injection sites based on palpation and pain. Used emg machine to target the muscle. Patient tolerated well no side effects. Felt better after procedure. Called her this evening, no side effects.

## 2020-04-21 ENCOUNTER — Ambulatory Visit: Payer: Medicare Other | Admitting: Neurology

## 2020-04-21 ENCOUNTER — Other Ambulatory Visit: Payer: Self-pay

## 2020-04-21 DIAGNOSIS — G43711 Chronic migraine without aura, intractable, with status migrainosus: Secondary | ICD-10-CM | POA: Diagnosis not present

## 2020-04-21 MED ORDER — KETOROLAC TROMETHAMINE 60 MG/2ML IM SOLN: INTRAMUSCULAR | 0 refills | Status: DC

## 2020-04-21 MED ORDER — KETOROLAC TROMETHAMINE 60 MG/2ML IM SOLN
60.0000 mg | Freq: Once | INTRAMUSCULAR | Status: AC
  Administered 2020-04-21: 60 mg via INTRAMUSCULAR

## 2020-04-21 NOTE — Progress Notes (Addendum)
Updated pt signature obtained today on Botox side effect sheet  Botox- 200 units x 1 vial Lot: [id number] Expiration: 06/2022 NDC: 3532-9924-26  Bacteriostatic 0.9% Sodium Chloride- 69mL total Lot: [id number] Expiration: 05/22/2021 NDC: 2229-7989-21  Dx: J94.174 B/B  Toradol 60 mg IM given in RUOQ of R buttock per v.o Dr Jaynee Eagles. Pt tolerated well. Aseptic technique maintained and bandaid applied.   Nerve block w/o steroid: Pt signed consent  0.5% Bupivocaine  LOT: [id number] EXP: 08/2022 NDC: 85631-497-02  2% Lidocaine  LOT: [id number] EXP: 07/22/2020 NDC: 6378-5885-02

## 2020-04-21 NOTE — Progress Notes (Addendum)
Performed by Dr. Jaynee Eagles M.D. 30-gauge needle was used. All procedures a documented blood were medically necessary, reasonable and appropriate based on the patient's history, medical diagnosis and physician opinion. Verbal informed consent was obtained from the patient, patient was informed of potential risk of procedure, including bruising, bleeding, hematoma formation, infection, muscle weakness, muscle pain, numbness, transient hypertension, transient hyperglycemia and transient insomnia among others. All areas injected were topically clean with isopropyl rubbing alcohol. Nonsterile nonlatex gloves were worn during the procedure. Pain, decreased ROM.  1. Trigger point injections for left myofascial pain syndrome acute, pain significantly improved. Identified locations by palpation. Used emg machine to target muscles and avoid other organ injury(lumgs) discussed risks with atient, she did well, no problems. . Left occipital 67ml, left levator scapulae 23ml, left Rhomboid 24ml, left trap 81ml   Felt signifcantly improved as far as pain, range of motion improved of head and neck movements as well.

## 2020-04-21 NOTE — Addendum Note (Signed)
Addended by: Gildardo Griffes on: 04/21/2020 02:32 PM   Modules accepted: Orders

## 2020-04-22 ENCOUNTER — Other Ambulatory Visit: Payer: Self-pay | Admitting: Oncology

## 2020-04-22 DIAGNOSIS — D6861 Antiphospholipid syndrome: Secondary | ICD-10-CM

## 2020-04-24 ENCOUNTER — Encounter: Payer: Self-pay | Admitting: Oncology

## 2020-04-24 LAB — PROTIME-INR: INR: 1.7 — AB (ref 0.9–1.1)

## 2020-04-29 ENCOUNTER — Encounter: Payer: Self-pay | Admitting: Oncology

## 2020-04-29 LAB — PROTIME-INR: INR: 2.2 — AB (ref 0.9–1.1)

## 2020-05-06 ENCOUNTER — Encounter: Payer: Self-pay | Admitting: Oncology

## 2020-05-06 ENCOUNTER — Telehealth: Payer: Self-pay | Admitting: *Deleted

## 2020-05-06 ENCOUNTER — Encounter: Payer: Self-pay | Admitting: *Deleted

## 2020-05-06 DIAGNOSIS — D6861 Antiphospholipid syndrome: Secondary | ICD-10-CM | POA: Diagnosis not present

## 2020-05-06 DIAGNOSIS — Z7901 Long term (current) use of anticoagulants: Secondary | ICD-10-CM | POA: Diagnosis not present

## 2020-05-06 LAB — PROTIME-INR: INR: 3 — AB (ref 0.9–1.1)

## 2020-05-06 NOTE — Telephone Encounter (Signed)
Sent in basket to provider about the potential of gi bleed with the drugs coumadin and ketolorac and pt can't come off of coumadin due to APS

## 2020-05-09 MED ORDER — KETOROLAC TROMETHAMINE 60 MG/2ML IM SOLN
INTRAMUSCULAR | 0 refills | Status: DC
Start: 2020-05-09 — End: 2020-06-01

## 2020-05-09 NOTE — Addendum Note (Signed)
Addended by: Sarina Ill B on: 05/09/2020 09:28 PM   Modules accepted: Orders

## 2020-05-09 NOTE — Progress Notes (Addendum)
I discussed with patient at this appointment the risk of bleeding, morbidity and even death when using warfarin with NSAIDs such as ketorolac. She understood. I did prescribe limited injections of ketorolac in a month for severe unrelenting migraine not treated by other modality or medication and possibly necessitating ED visit. She understands risks, we recommend not using it unless emergently.

## 2020-05-11 ENCOUNTER — Ambulatory Visit (INDEPENDENT_AMBULATORY_CARE_PROVIDER_SITE_OTHER): Payer: Medicare Other | Admitting: Family

## 2020-05-11 ENCOUNTER — Ambulatory Visit (INDEPENDENT_AMBULATORY_CARE_PROVIDER_SITE_OTHER): Payer: Medicare Other

## 2020-05-11 ENCOUNTER — Encounter: Payer: Self-pay | Admitting: Family

## 2020-05-11 ENCOUNTER — Other Ambulatory Visit: Payer: Self-pay

## 2020-05-11 VITALS — BP 104/60 | HR 78 | Temp 98.3°F | Ht 64.5 in | Wt 233.0 lb

## 2020-05-11 DIAGNOSIS — M25511 Pain in right shoulder: Secondary | ICD-10-CM | POA: Insufficient documentation

## 2020-05-11 DIAGNOSIS — F419 Anxiety disorder, unspecified: Secondary | ICD-10-CM

## 2020-05-11 DIAGNOSIS — G8929 Other chronic pain: Secondary | ICD-10-CM | POA: Diagnosis not present

## 2020-05-11 DIAGNOSIS — E6609 Other obesity due to excess calories: Secondary | ICD-10-CM | POA: Diagnosis not present

## 2020-05-11 DIAGNOSIS — Z6834 Body mass index (BMI) 34.0-34.9, adult: Secondary | ICD-10-CM | POA: Diagnosis not present

## 2020-05-11 DIAGNOSIS — F329 Major depressive disorder, single episode, unspecified: Secondary | ICD-10-CM

## 2020-05-11 DIAGNOSIS — F32A Depression, unspecified: Secondary | ICD-10-CM

## 2020-05-11 NOTE — Patient Instructions (Addendum)
Please consider Cymbalta and discuss with Dr Aundra Dubin  Referral to medical weight management ( this is Dr Diamantina [facility] office ) and to sport medicine ( Dr Tamala Julian and Dr Georgina Snell)   Absolute pleasure meeting you .   Enjoy your trip to [place]!    Duloxetine delayed-release capsules What is this medicine? DULOXETINE (doo LOX e teen) is used to treat depression, anxiety, and different types of chronic pain. This medicine may be used for other purposes; ask your health care provider or pharmacist if you have questions. COMMON BRAND NAME(S): Cymbalta, Creig Hines, Irenka What should I tell my health care provider before I take this medicine? They need to know if you have any of these conditions:  bipolar disorder  glaucoma  high blood pressure  kidney disease  liver disease  seizures  suicidal thoughts, plans or attempt; a previous suicide attempt by you or a family member  take medicines that treat or prevent blood clots  taken medicines called MAOIs like Carbex, Eldepryl, Marplan, Nardil, and Parnate within 14 days  trouble passing urine  an unusual reaction to duloxetine, other medicines, foods, dyes, or preservatives  pregnant or trying to get pregnant  breast-feeding How should I use this medicine? Take this medicine by mouth with a glass of water. Follow the directions on the prescription label. Do not crush, cut or chew some capsules of this medicine. Some capsules may be opened and sprinkled on applesauce. Check with your doctor or pharmacist if you are not sure. You can take this medicine with or without food. Take your medicine at regular intervals. Do not take your medicine more often than directed. Do not stop taking this medicine suddenly except upon the advice of your doctor. Stopping this medicine too quickly may cause serious side effects or your condition may worsen. A special MedGuide will be given to you by the pharmacist with each prescription and refill. Be  sure to read this information carefully each time. Talk to your pediatrician regarding the use of this medicine in children. While this drug may be prescribed for children as young as 56 years of age for selected conditions, precautions do apply. Overdosage: If you think you have taken too much of this medicine contact a poison control center or emergency room at once. NOTE: This medicine is only for you. Do not share this medicine with others. What if I miss a dose? If you miss a dose, take it as soon as you can. If it is almost time for your next dose, take only that dose. Do not take double or extra doses. What may interact with this medicine? Do not take this medicine with any of the following medications:  desvenlafaxine  levomilnacipran  linezolid  MAOIs like Carbex, Eldepryl, Marplan, Nardil, and Parnate  methylene blue (injected into a vein)  milnacipran  thioridazine  venlafaxine This medicine may also interact with the following medications:  alcohol  amphetamines  aspirin and aspirin-like medicines  certain antibiotics like ciprofloxacin and enoxacin  certain medicines for blood pressure, heart disease, irregular heart beat  certain medicines for depression, anxiety, or psychotic disturbances  certain medicines for migraine headache like almotriptan, eletriptan, frovatriptan, naratriptan, rizatriptan, sumatriptan, zolmitriptan  certain medicines that treat or prevent blood clots like warfarin, enoxaparin, and dalteparin  cimetidine  fentanyl  lithium  NSAIDS, medicines for pain and inflammation, like ibuprofen or naproxen  phentermine  procarbazine  rasagiline  sibutramine  St. John's wort  theophylline  tramadol  tryptophan This list  may not describe all possible interactions. Give your health care provider a list of all the medicines, herbs, non-prescription drugs, or dietary supplements you use. Also tell them if you smoke, drink alcohol,  or use illegal drugs. Some items may interact with your medicine. What should I watch for while using this medicine? Tell your doctor if your symptoms do not get better or if they get worse. Visit your doctor or healthcare provider for regular checks on your progress. Because it may take several weeks to see the full effects of this medicine, it is important to continue your treatment as prescribed by your doctor. This medicine may cause serious skin reactions. They can happen weeks to months after starting the medicine. Contact your healthcare provider right away if you notice fevers or flu-like symptoms with a rash. The rash may be red or purple and then turn into blisters or peeling of the skin. Or, you might notice a red rash with swelling of the face, lips, or lymph nodes in your neck or under your arms. Patients and their families should watch out for new or worsening thoughts of suicide or depression. Also watch out for sudden changes in feelings such as feeling anxious, agitated, panicky, irritable, hostile, aggressive, impulsive, severely restless, overly excited and hyperactive, or not being able to sleep. If this happens, especially at the beginning of treatment or after a change in dose, call your healthcare provider. You may get drowsy or dizzy. Do not drive, use machinery, or do anything that needs mental alertness until you know how this medicine affects you. Do not stand or sit up quickly, especially if you are an older patient. This reduces the risk of dizzy or fainting spells. Alcohol may interfere with the effect of this medicine. Avoid alcoholic drinks. This medicine can cause an increase in blood pressure. This medicine can also cause a sudden drop in your blood pressure, which may make you feel faint and increase the chance of a fall. These effects are most common when you first start the medicine or when the dose is increased, or during use of other medicines that can cause a sudden drop  in blood pressure. Check with your doctor for instructions on monitoring your blood pressure while taking this medicine. Your mouth may get dry. Chewing sugarless gum or sucking hard candy, and drinking plenty of water, may help. Contact your doctor if the problem does not go away or is severe. What side effects may I notice from receiving this medicine? Side effects that you should report to your doctor or health care professional as soon as possible:  allergic reactions like skin rash, itching or hives, swelling of the face, lips, or tongue  anxious  breathing problems  confusion  changes in vision  chest pain  confusion  elevated mood, decreased need for sleep, racing thoughts, impulsive behavior  eye pain  fast, irregular heartbeat  feeling faint or lightheaded, falls  feeling agitated, angry, or irritable  hallucination, loss of contact with reality  high blood pressure  loss of balance or coordination  palpitations  redness, blistering, peeling or loosening of the skin, including inside the mouth  restlessness, pacing, inability to keep still  seizures  stiff muscles  suicidal thoughts or other mood changes  trouble passing urine or change in the amount of urine  trouble sleeping  unusual bleeding or bruising  unusually weak or tired  vomiting  yellowing of the eyes or skin Side effects that usually do not require  medical attention (report to your doctor or health care professional if they continue or are bothersome):  change in sex drive or performance  change in appetite or weight  constipation  dizziness  dry mouth  headache  increased sweating  nausea  tired This list may not describe all possible side effects. Call your doctor for medical advice about side effects. You may report side effects to FDA at 1-800-FDA-1088. Where should I keep my medicine? Keep out of the reach of children. Store at room temperature between 15 and  30 degrees C (59 to 86 degrees F). Throw away any unused medicine after the expiration date. NOTE: This sheet is a summary. It may not cover all possible information. If you have questions about this medicine, talk to your doctor, pharmacist, or health care provider.  2020 [employer] Standard (2018-11-08 13:47:50)

## 2020-05-11 NOTE — Assessment & Plan Note (Signed)
Discussed her frustration and certainly how it contributes to depression. Advised consult with [facility], Dr Leafy Ro. Referral placed.
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
## 2020-05-11 NOTE — Assessment & Plan Note (Signed)
Chronic, worsening. Exam consistent with AC joint arthritis which supported  Reduction in ROM suggests RCT and/or impingement syndrome. Advised patient to have consult with [facility], Dr Tamala Julian or Dr Georgina Snell for further evaluation.

## 2020-05-11 NOTE — Assessment & Plan Note (Signed)
Sub optimal control. Advised to discuss potential for agent such as cymbalta versus zoloft. She has upcoming appointment with Dr Aundra Dubin and felt very comfortable in discussing with her.

## 2020-05-12 ENCOUNTER — Other Ambulatory Visit: Payer: Self-pay | Admitting: Internal Medicine

## 2020-05-13 ENCOUNTER — Encounter: Payer: Self-pay | Admitting: *Deleted

## 2020-05-13 ENCOUNTER — Encounter: Payer: Self-pay | Admitting: Oncology

## 2020-05-13 DIAGNOSIS — G894 Chronic pain syndrome: Secondary | ICD-10-CM | POA: Diagnosis not present

## 2020-05-13 DIAGNOSIS — R110 Nausea: Secondary | ICD-10-CM | POA: Diagnosis not present

## 2020-05-13 DIAGNOSIS — G890 Central pain syndrome: Secondary | ICD-10-CM | POA: Diagnosis not present

## 2020-05-13 DIAGNOSIS — Z79891 Long term (current) use of opiate analgesic: Secondary | ICD-10-CM | POA: Diagnosis not present

## 2020-05-13 LAB — PROTIME-INR: INR: 2.3 — AB (ref 0.9–1.1)

## 2020-05-14 ENCOUNTER — Telehealth: Payer: Self-pay | Admitting: Internal Medicine

## 2020-05-14 ENCOUNTER — Other Ambulatory Visit: Payer: Self-pay | Admitting: Internal Medicine

## 2020-05-14 DIAGNOSIS — F419 Anxiety disorder, unspecified: Secondary | ICD-10-CM

## 2020-05-14 DIAGNOSIS — G4700 Insomnia, unspecified: Secondary | ICD-10-CM

## 2020-05-14 DIAGNOSIS — E785 Hyperlipidemia, unspecified: Secondary | ICD-10-CM

## 2020-05-14 DIAGNOSIS — F32A Depression, unspecified: Secondary | ICD-10-CM

## 2020-05-14 MED ORDER — SERTRALINE HCL 100 MG PO TABS: 150.0000 mg | ORAL_TABLET | Freq: Every day | ORAL | 3 refills | Status: DC

## 2020-05-14 MED ORDER — BUPROPION HCL ER (XL) 300 MG PO TB24: ORAL_TABLET | ORAL | 3 refills | Status: DC

## 2020-05-14 MED ORDER — ATORVASTATIN CALCIUM 40 MG PO TABS: 40.0000 mg | ORAL_TABLET | Freq: Every day | ORAL | 3 refills | Status: DC

## 2020-05-14 MED ORDER — TRAZODONE HCL 150 MG PO TABS: 150.0000 mg | ORAL_TABLET | Freq: Every evening | ORAL | 3 refills | Status: DC | PRN

## 2020-05-14 NOTE — Telephone Encounter (Signed)
Pt cancelled appt for 9/22, will call back in the future if still needed.  From [employer] sports med

## 2020-05-15 ENCOUNTER — Other Ambulatory Visit: Payer: Self-pay | Admitting: Internal Medicine

## 2020-05-15 ENCOUNTER — Ambulatory Visit: Payer: Medicare Other | Admitting: Family Medicine

## 2020-05-15 DIAGNOSIS — F32A Depression, unspecified: Secondary | ICD-10-CM

## 2020-05-15 DIAGNOSIS — F419 Anxiety disorder, unspecified: Secondary | ICD-10-CM

## 2020-05-15 MED ORDER — ALPRAZOLAM 0.5 MG PO TABS: 0.5000 mg | ORAL_TABLET | Freq: Every day | ORAL | 5 refills | Status: DC | PRN

## 2020-05-19 ENCOUNTER — Encounter: Payer: Self-pay | Admitting: Oncology

## 2020-05-19 LAB — PROTIME-INR: INR: 3.6 — AB (ref 0.9–1.1)

## 2020-05-20 ENCOUNTER — Encounter: Payer: Self-pay | Admitting: *Deleted

## 2020-05-22 ENCOUNTER — Ambulatory Visit: Payer: Medicare Other | Admitting: Internal Medicine

## 2020-05-28 ENCOUNTER — Encounter: Payer: Self-pay | Admitting: *Deleted

## 2020-05-28 ENCOUNTER — Encounter: Payer: Self-pay | Admitting: Oncology

## 2020-05-28 LAB — PROTIME-INR: INR: 3.2 — AB (ref 0.9–1.1)

## 2020-06-01 ENCOUNTER — Other Ambulatory Visit: Payer: Self-pay

## 2020-06-01 ENCOUNTER — Ambulatory Visit (INDEPENDENT_AMBULATORY_CARE_PROVIDER_SITE_OTHER): Payer: Medicare Other | Admitting: Family Medicine

## 2020-06-01 ENCOUNTER — Encounter (INDEPENDENT_AMBULATORY_CARE_PROVIDER_SITE_OTHER): Payer: Self-pay | Admitting: Family Medicine

## 2020-06-01 VITALS — BP 118/58 | HR 77 | Temp 98.2°F | Ht 65.0 in | Wt 231.0 lb

## 2020-06-01 DIAGNOSIS — E1169 Type 2 diabetes mellitus with other specified complication: Secondary | ICD-10-CM

## 2020-06-01 DIAGNOSIS — R5383 Other fatigue: Secondary | ICD-10-CM

## 2020-06-01 DIAGNOSIS — E1165 Type 2 diabetes mellitus with hyperglycemia: Secondary | ICD-10-CM

## 2020-06-01 DIAGNOSIS — R0602 Shortness of breath: Secondary | ICD-10-CM

## 2020-06-01 DIAGNOSIS — S83207A Unspecified tear of unspecified meniscus, current injury, left knee, initial encounter: Secondary | ICD-10-CM | POA: Insufficient documentation

## 2020-06-01 DIAGNOSIS — E785 Hyperlipidemia, unspecified: Secondary | ICD-10-CM

## 2020-06-01 DIAGNOSIS — F419 Anxiety disorder, unspecified: Secondary | ICD-10-CM

## 2020-06-01 DIAGNOSIS — Z0289 Encounter for other administrative examinations: Secondary | ICD-10-CM

## 2020-06-01 DIAGNOSIS — E559 Vitamin D deficiency, unspecified: Secondary | ICD-10-CM | POA: Diagnosis not present

## 2020-06-01 DIAGNOSIS — Z6838 Body mass index (BMI) 38.0-38.9, adult: Secondary | ICD-10-CM | POA: Diagnosis not present

## 2020-06-01 DIAGNOSIS — F32A Depression, unspecified: Secondary | ICD-10-CM

## 2020-06-02 LAB — CBC WITH DIFFERENTIAL/PLATELET
Basophils Absolute: 0 10*3/uL (ref 0.0–0.2)
Basos: 1 %
EOS (ABSOLUTE): 0.5 10*3/uL — ABNORMAL HIGH (ref 0.0–0.4)
Eos: 10 %
Hematocrit: 37 % (ref 34.0–46.6)
Hemoglobin: 11.8 g/dL (ref 11.1–15.9)
Immature Grans (Abs): 0 10*3/uL (ref 0.0–0.1)
Immature Granulocytes: 0 %
Lymphocytes Absolute: 2 10*3/uL (ref 0.7–3.1)
Lymphs: 37 %
MCH: 27.6 pg (ref 26.6–33.0)
MCHC: 31.9 g/dL (ref 31.5–35.7)
MCV: 87 fL (ref 79–97)
Monocytes Absolute: 0.4 10*3/uL (ref 0.1–0.9)
Monocytes: 7 %
Neutrophils Absolute: 2.5 10*3/uL (ref 1.4–7.0)
Neutrophils: 45 %
Platelets: 269 10*3/uL (ref 150–450)
RBC: 4.27 x10E6/uL (ref 3.77–5.28)
RDW: 15.1 % (ref 11.7–15.4)
WBC: 5.4 10*3/uL (ref 3.4–10.8)

## 2020-06-02 LAB — COMPREHENSIVE METABOLIC PANEL
ALT: 17 IU/L (ref 0–32)
AST: 20 IU/L (ref 0–40)
Albumin/Globulin Ratio: 1.4 (ref 1.2–2.2)
Albumin: 3.8 g/dL (ref 3.8–4.8)
Alkaline Phosphatase: 72 IU/L (ref 44–121)
BUN/Creatinine Ratio: 14 (ref 9–23)
BUN: 9 mg/dL (ref 6–24)
Bilirubin Total: <0.2 mg/dL (ref 0.0–1.2)
CO2: 23 mmol/L (ref 20–29)
Calcium: 9.2 mg/dL (ref 8.7–10.2)
Chloride: 103 mmol/L (ref 96–106)
Creatinine, Ser: 0.63 mg/dL (ref 0.57–1.00)
GFR calc Af Amer: 121 mL/min/{1.73_m2} (ref >59)
GFR calc non Af Amer: 105 mL/min/{1.73_m2} (ref >59)
Globulin, Total: 2.7 g/dL (ref 1.5–4.5)
Glucose: 127 mg/dL — ABNORMAL HIGH (ref 65–99)
Potassium: 4.3 mmol/L (ref 3.5–5.2)
Sodium: 142 mmol/L (ref 134–144)
Total Protein: 6.5 g/dL (ref 6.0–8.5)

## 2020-06-02 LAB — LIPID PANEL WITH LDL/HDL RATIO
Cholesterol, Total: 121 mg/dL (ref 100–199)
HDL: 56 mg/dL (ref >39)
LDL Chol Calc (NIH): 35 mg/dL (ref 0–99)
LDL/HDL Ratio: 0.6 ratio (ref 0.0–3.2)
Triglycerides: 187 mg/dL — ABNORMAL HIGH (ref 0–149)
VLDL Cholesterol Cal: 30 mg/dL (ref 5–40)

## 2020-06-02 LAB — TSH: TSH: 1.45 u[IU]/mL (ref 0.450–4.500)

## 2020-06-02 LAB — INSULIN, RANDOM: INSULIN: 24.3 u[IU]/mL (ref 2.6–24.9)

## 2020-06-02 LAB — HEMOGLOBIN A1C
Est. average glucose Bld gHb Est-mCnc: 200 mg/dL
Hgb A1c MFr Bld: 8.6 % — ABNORMAL HIGH (ref 4.8–5.6)

## 2020-06-02 LAB — VITAMIN D 25 HYDROXY (VIT D DEFICIENCY, FRACTURES): Vit D, 25-Hydroxy: 32.2 ng/mL (ref 30.0–100.0)

## 2020-06-02 LAB — T4: T4, Total: 6.4 ug/dL (ref 4.5–12.0)

## 2020-06-02 LAB — T3: T3, Total: 106 ng/dL (ref 71–180)

## 2020-06-03 NOTE — Progress Notes (Signed)
Dear Amanda Vasquez, [place],   Thank you for referring Amanda Vasquez to our clinic. The following note includes my evaluation and treatment recommendations.  Chief Complaint:   [name] (MR# [id number]) is a 49 y.o. female who presents for evaluation and treatment of obesity and related comorbidities. Current BMI is Body mass index is 38.44 kg/m. Amanda Vasquez has been struggling with her weight for many years and has been unsuccessful in either losing weight, maintaining weight loss, or reaching her healthy weight goal.  Amanda Vasquez is currently in the action stage of change and ready to dedicate time achieving and maintaining a healthier weight. Amanda Vasquez is interested in becoming our patient and working on intensive lifestyle modifications including (but not limited to) diet and exercise for weight loss.  Bertine was referred by Amanda Vasquez.  Mostly vegetarian.  Skipping breakfast most days. At around 11 am, she will have a banana or yogurt (satisfied).  For a snack at around 4 pm, she will have some fruit, such as a banana (satisfied).  Dinner will consist of pizza (2-3 slices) and then snack in bed on [employer] Ice Cream.  Amanda Vasquez's habits were reviewed today and are as follows: Her family eats meals together, she thinks her family will eat healthier with her, her desired weight loss is 60 pounds, she has been heavy most of her life, she started gaining weight as a child, her heaviest weight ever was 304 pounds, she craves savory or sweet foods, she snacks frequently in the evenings, she skips breakfast frequently, she sometimes makes poor food choices, she has problems with excessive hunger, she frequently eats larger portions than normal and she struggles with emotional eating.  Depression Screen Amanda Vasquez's Food and Mood (modified PHQ-9) score was 11.  Depression screen PHQ 2/9 06/01/2020  Decreased Interest 1  Down, Depressed, Hopeless 1  PHQ - 2 Score 2  Altered sleeping 0  Tired, decreased  energy 1  Change in appetite 2  Feeling bad or failure about yourself  3  Trouble concentrating 2  Moving slowly or fidgety/restless 1  Suicidal thoughts 0  PHQ-9 Score 11  Difficult doing work/chores Not difficult at all  Some recent data might be hidden   Subjective:   1. Other fatigue Amberly admits to daytime somnolence and reports waking up still tired. Patent has a history of symptoms of daytime fatigue, morning fatigue and snoring. Amanda Vasquez generally gets 8 hours of sleep per night, and states that she has generally restful sleep. Snoring is present. Apneic episodes are not present. Epworth Sleepiness Score is 7.  2. SOB (shortness of breath) on exertion Amanda Vasquez notes increasing shortness of breath with exercising and seems to be worsening over time with weight gain. She notes getting out of breath sooner with activity than she used to. This has gotten worse recently. Amanda Vasquez denies shortness of breath at rest or orthopnea.  3. Type 2 diabetes mellitus with hyperglycemia, without long-term current use of insulin ([place]) She is on glipizide, metformin, and Jardiance.  Blood sugars are around 120.  No episodes of hypoglycemia.  Last A1c was 7.3.  Last eye exam recently.   Lab Results  Component Value Date   HGBA1C 8.6 (H) 06/01/2020   HGBA1C 7.3 (H) 01/21/2020   HGBA1C 6.7 (A) 02/18/2019   Lab Results  Component Value Date   MICROALBUR <0.2 01/23/2020   LDLCALC 35 06/01/2020   CREATININE 0.63 06/01/2020   Lab Results  Component Value Date  INSULIN 24.3 06/01/2020   4. Vitamin D deficiency She is currently taking OTC vitamin D 5,000 IU each day. She denies nausea, vomiting or muscle weakness.  She endorses fatigue.  5. Hyperlipidemia associated with type 2 diabetes mellitus ([place]) Amanda Vasquez has hyperlipidemia and has been trying to improve her cholesterol levels with intensive lifestyle modification including a low saturated fat diet, exercise and weight loss. She denies any chest pain,  claudication or myalgias.  Last LDL was 50.  HDL 66.  Lab Results  Component Value Date   ALT 17 06/01/2020   AST 20 06/01/2020   ALKPHOS 72 06/01/2020   BILITOT <0.2 06/01/2020   Lab Results  Component Value Date   CHOL 121 06/01/2020   HDL 56 06/01/2020   LDLCALC 35 06/01/2020   LDLDIRECT 80.0 09/19/2018   TRIG 187 (H) 06/01/2020   CHOLHDL 2 01/21/2020   6. Anxiety and depression, with emotional eating She is on sertraline 150 mg, Wellbutrin 300 mg, and Xanax 3-4 times per week.  Symptoms are well-controlled.  Assessment/Plan:   1. Other fatigue Amanda Vasquez does feel that her weight is causing her energy to be lower than it should be. Fatigue may be related to obesity, depression or many other causes. Labs will be ordered, and in the meanwhile, Amanda Vasquez will focus on self care including making healthy food choices, increasing physical activity and focusing on stress reduction.  - EKG 12-Lead - T3 - T4 - TSH  2. SOB (shortness of breath) on exertion [name] does feel that she gets out of breath more easily that she used to when she exercises. Amanda Vasquez's shortness of breath appears to be obesity related and exercise induced. She has agreed to work on weight loss and gradually increase exercise to treat her exercise induced shortness of breath. Will continue to monitor closely.  - CBC with Differential/Platelet  3. Type 2 diabetes mellitus with hyperglycemia, without long-term current use of insulin ([name]) Good blood sugar control is important to decrease the likelihood of diabetic complications such as nephropathy, neuropathy, limb loss, blindness, coronary artery disease, and death. Intensive lifestyle modification including diet, exercise and weight loss are the first line of treatment for diabetes.  Will check A1c and insulin today.  Hypoglycemia protocol discussed.  - Comprehensive metabolic panel - Hemoglobin A1c - Insulin, random  4. Vitamin D deficiency Low Vitamin D level contributes  to fatigue and are associated with obesity, breast, and colon cancer. She agrees to continue to take OTC Vitamin D @5 ,000 IU daily and will follow-up for routine testing of Vitamin D, at least 2-3 times per year to avoid over-replacement.  Will check vitamin D level today.  - VITAMIN D 25 Hydroxy (Vit-D Deficiency, Fractures)  5. Hyperlipidemia associated with type 2 diabetes mellitus ([place]) Cardiovascular risk and specific lipid/LDL goals reviewed.  We discussed several lifestyle modifications today and Shakeema will continue to work on diet, exercise and weight loss efforts. Orders and follow up as documented in patient record.  Will check FLP today.  Counseling Intensive lifestyle modifications are the first line treatment for this issue. . Dietary changes: Increase soluble fiber. Decrease simple carbohydrates. . Exercise changes: Moderate to vigorous-intensity aerobic activity 150 minutes per week if tolerated. . Lipid-lowering medications: see documented in medical record. - Lipid Panel With LDL/HDL Ratio  6. Anxiety and depression, with emotional eating Follow-up with Dr. Jacklynn Lewis.  7. Class 2 severe obesity with serious comorbidity and body mass index (BMI) of 38.0 to 38.9 in adult, unspecified obesity  type [facility])  Dashauna is currently in the action stage of change and her goal is to continue with weight loss efforts. I recommend Robertta begin the structured treatment plan as follows:  She has agreed to the [place].  Exercise goals: No exercise has been prescribed at this time.   Behavioral modification strategies: increasing lean protein intake, meal planning and cooking strategies, keeping healthy foods in the home and planning for success.  She was informed of the importance of frequent follow-up visits to maximize her success with intensive lifestyle modifications for her multiple health conditions. She was informed we would discuss her lab results at her next visit unless there is  a critical issue that needs to be addressed sooner. Aveline agreed to keep her next visit at the agreed upon time to discuss these results.  Objective:   Blood pressure (!) 118/58, pulse 77, temperature 98.2 F (36.8 C), temperature source Oral, height 5\' 5"  (1.651 m), weight 231 lb (104.8 kg), last menstrual period 03/05/2018, SpO2 96 %. Body mass index is 38.44 kg/m.  EKG: Normal sinus rhythm, rate 76 bpm.  Indirect Calorimeter completed today shows a VO2 of 338 and a REE of 2354.  Her calculated basal metabolic rate is 6387 thus her basal metabolic rate is better than expected.  General: Cooperative, alert, well developed, in no acute distress. HEENT: Conjunctivae and lids unremarkable. Cardiovascular: Regular rhythm.  Lungs: Normal work of breathing. Neurologic: No focal deficits.   Lab Results  Component Value Date   CREATININE 0.63 06/01/2020   BUN 9 06/01/2020   NA 142 06/01/2020   K 4.3 06/01/2020   CL 103 06/01/2020   CO2 23 06/01/2020   Lab Results  Component Value Date   ALT 17 06/01/2020   AST 20 06/01/2020   ALKPHOS 72 06/01/2020   BILITOT <0.2 06/01/2020   Lab Results  Component Value Date   HGBA1C 8.6 (H) 06/01/2020   HGBA1C 7.3 (H) 01/21/2020   HGBA1C 6.7 (A) 02/18/2019   HGBA1C 7.8 (H) 09/19/2018   HGBA1C 8.0 (A) 05/31/2018   Lab Results  Component Value Date   INSULIN 24.3 06/01/2020   Lab Results  Component Value Date   TSH 1.450 06/01/2020   Lab Results  Component Value Date   CHOL 121 06/01/2020   HDL 56 06/01/2020   LDLCALC 35 06/01/2020   LDLDIRECT 80.0 09/19/2018   TRIG 187 (H) 06/01/2020   CHOLHDL 2 01/21/2020   Lab Results  Component Value Date   WBC 5.4 06/01/2020   HGB 11.8 06/01/2020   HCT 37.0 06/01/2020   MCV 87 06/01/2020   PLT 269 06/01/2020   Lab Results  Component Value Date   IRON 65 05/06/2010   Obesity Behavioral Intervention:   Approximately 15 minutes were spent on the discussion below.  ASK: We discussed  the diagnosis of obesity with Jawanna today and Suhey agreed to give [place] permission to discuss obesity behavioral modification therapy today.  ASSESS: Rosan has the diagnosis of obesity and her BMI today is 38.5. Afsheen is in the action stage of change.   ADVISE: Lajuanda was educated on the multiple health risks of obesity as well as the benefit of weight loss to improve her health. She was advised of the need for long term treatment and the importance of lifestyle modifications to improve her current health and to decrease her risk of future health problems.  AGREE: Multiple dietary modification options and treatment options were discussed and Beaulah agreed to follow  the recommendations documented in the above note.  ARRANGE: Alany was educated on the importance of frequent visits to treat obesity as outlined per CMS and USPSTF guidelines and agreed to schedule her next follow up appointment today.  Attestation Statements:   Reviewed by clinician on day of visit: allergies, medications, problem list, medical history, surgical history, family history, social history, and previous encounter notes.  I, Water quality scientist, CMA, am acting as transcriptionist for Coralie Common, MD.  This is the patient's first visit at [facility]. The patient's NEW PATIENT PACKET was reviewed at length. Included in the packet: current and past health history, medications, allergies, ROS, gynecologic history (women only), surgical history, family history, social history, weight history, weight loss surgery history (for those that have had weight loss surgery), nutritional evaluation, mood and food questionnaire, PHQ9, Epworth questionnaire, sleep habits questionnaire, patient life and health improvement goals questionnaire. These will all be scanned into the patient's chart under media.   During the visit, I independently reviewed the patient's EKG, bioimpedance scale results, and indirect calorimeter results. I used  this information to tailor a meal plan for the patient that will help her to lose weight and will improve her obesity-related conditions going forward. I performed a medically necessary appropriate examination and/or evaluation. I discussed the assessment and treatment plan with the patient. The patient was provided an opportunity to ask questions and all were answered. The patient agreed with the plan and demonstrated an understanding of the instructions. Labs were ordered at this visit and will be reviewed at the next visit unless more critical results need to be addressed immediately. Clinical information was updated and documented in the EMR.   Time spent on visit including pre-visit chart review and post-visit care was 45 minutes.   A separate 15 minutes was spent on risk counseling (see above).  I have reviewed the above documentation for accuracy and completeness, and I agree with the above. - Jinny Blossom, MD

## 2020-06-04 ENCOUNTER — Ambulatory Visit (INDEPENDENT_AMBULATORY_CARE_PROVIDER_SITE_OTHER): Payer: Medicare Other | Admitting: Internal Medicine

## 2020-06-04 ENCOUNTER — Encounter: Payer: Self-pay | Admitting: Internal Medicine

## 2020-06-04 ENCOUNTER — Other Ambulatory Visit: Payer: Self-pay

## 2020-06-04 VITALS — BP 136/84 | HR 88 | Temp 98.1°F | Ht 65.0 in | Wt 226.4 lb

## 2020-06-04 DIAGNOSIS — M19011 Primary osteoarthritis, right shoulder: Secondary | ICD-10-CM | POA: Diagnosis not present

## 2020-06-04 DIAGNOSIS — R1114 Bilious vomiting: Secondary | ICD-10-CM | POA: Diagnosis not present

## 2020-06-04 DIAGNOSIS — E1169 Type 2 diabetes mellitus with other specified complication: Secondary | ICD-10-CM | POA: Diagnosis not present

## 2020-06-04 DIAGNOSIS — E669 Obesity, unspecified: Secondary | ICD-10-CM

## 2020-06-04 DIAGNOSIS — Z1211 Encounter for screening for malignant neoplasm of colon: Secondary | ICD-10-CM

## 2020-06-04 DIAGNOSIS — Z599 Problem related to housing and economic circumstances, unspecified: Secondary | ICD-10-CM

## 2020-06-04 DIAGNOSIS — Z8639 Personal history of other endocrine, nutritional and metabolic disease: Secondary | ICD-10-CM | POA: Diagnosis not present

## 2020-06-04 MED ORDER — PROMETHAZINE HCL 25 MG PO TABS: 25.0000 mg | ORAL_TABLET | Freq: Two times a day (BID) | ORAL | 5 refills | Status: DC | PRN

## 2020-06-04 NOTE — Patient Instructions (Addendum)
Voltaren gel  Lidocaine pain patch   Dr. Heide Scales  Dr. Burna Mortimer, [facility]  Shoulder Range of Motion Exercises Shoulder range of motion (ROM) exercises are done to keep the shoulder moving freely or to increase movement. They are often recommended for people who have shoulder pain or stiffness or who are recovering from a shoulder surgery. Phase 1 exercises When you are able, do this exercise 1-2 times per day for 30-60 seconds in each direction, or as directed by your health care provider. Pendulum exercise To do this exercise while sitting: 1. Sit in a chair or at the edge of your bed with your feet flat on the floor. 2. Let your affected arm hang down in front of you over the edge of the bed or chair. 3. Relax your shoulder, arm, and hand. Fountain your body so your arm gently swings in small circles. You can also use your unaffected arm to start the motion. 5. Repeat changing the direction of the circles, swinging your arm left and right, and swinging your arm forward and back. To do this exercise while standing: 1. Stand next to a sturdy chair or table, and hold on to it with your hand on your unaffected side. 2. Bend forward at the waist. 3. Bend your knees slightly. 4. Relax your shoulder, arm, and hand. 5. While keeping your shoulder relaxed, use body motion to swing your arm in small circles. 6. Repeat changing the direction of the circles, swinging your arm left and right, and swinging your arm forward and back. 7. Between exercises, stand up tall and take a short break to relax your lower back.  Phase 2 exercises Do these exercises 1-2 times per day or as told by your health care provider. Hold each stretch for 30 seconds, and repeat 3 times. Do the exercises with one or both arms as instructed by your health care provider. For these exercises, sit at a table with your hand and arm supported by the table. A chair that slides easily or has wheels can be  helpful. External rotation 1. Turn your chair so that your affected side is nearest to the table. 2. Place your forearm on the table to your side. Bend your elbow about 90 at the elbow (right angle) and place your hand palm facing down on the table. Your elbow should be about 6 inches away from your side. 3. Keeping your arm on the table, lean your body forward. Abduction 1. Turn your chair so that your affected side is nearest to the table. 2. Place your forearm and hand on the table so that your thumb points toward the ceiling and your arm is straight out to your side. 3. Slide your hand out to the side and away from you, using your unaffected arm to do the work. 4. To increase the stretch, you can slide your chair away from the table. Flexion: forward stretch 1. Sit facing the table. Place your hand and elbow on the table in front of you. 2. Slide your hand forward and away from you, using your unaffected arm to do the work. 3. To increase the stretch, you can slide your chair backward. Phase 3 exercises Do these exercises 1-2 times per day or as told by your health care provider. Hold each stretch for 30 seconds, and repeat 3 times. Do the exercises with one or both arms as instructed by your health care provider. Cross-body stretch: posterior capsule stretch 1. Lift your arm straight out  in front of you. 2. Bend your arm 90 at the elbow (right angle) so your forearm moves across your body. 3. Use your other arm to gently pull the elbow across your body, toward your other shoulder. Wall climbs 1. Stand with your affected arm extended out to the side with your hand resting on a door frame. 2. Slide your hand slowly up the door frame. 3. To increase the stretch, step through the door frame. Keep your body upright and do not lean. Wand exercises You will need a cane, a piece of PVC pipe, or a sturdy wooden dowel for wand exercises. Flexion To do this exercise while standing: 1. Hold  the wand with both of your hands, palms down. 2. Using the other arm to help, lift your arms up and over your head, if able. 3. Push upward with your other arm to gently increase the stretch. To do this exercise while lying down: 1. Lie on your back with your elbows resting on the floor and the wand in both your hands. Your hands will be palm down, or pointing toward your feet. 2. Lift your hands toward the ceiling, using your unaffected arm to help if needed. 3. Bring your arms overhead as able, using your unaffected arm to help if needed. Internal rotation 1. Stand while holding the wand behind you with both hands. Your unaffected arm should be extended above your head with the arm of the affected side extended behind you at the level of your waist. The wand should be pointing straight up and down as you hold it. 2. Slowly pull the wand up behind your back by straightening the elbow of your unaffected arm and bending the elbow of your affected arm. External rotation 1. Lie on your back with your affected upper arm supported on a small pillow or rolled towel. When you first do this exercise, keep your upper arm close to your body. Over time, bring your arm up to a 90 angle out to the side. 2. Hold the wand across your stomach and with both hands palm up. Your elbow on your affected side should be bent at a 90 angle. 3. Use your unaffected side to help push your forearm away from you and toward the floor. Keep your elbow on your affected side bent at a 90 angle. Contact a health care provider if you have:  New or increasing pain.  New numbness, tingling, weakness, or discoloration in your arm or hand. This information is not intended to replace advice given to you by your health care provider. Make sure you discuss any questions you have with your health care provider. Document Revised: 09/20/2017 Document Reviewed: 09/20/2017 Elsevier Patient Education  [place].  Shoulder  Exercises Ask your health care provider which exercises are safe for you. Do exercises exactly as told by your health care provider and adjust them as directed. It is normal to feel mild stretching, pulling, tightness, or discomfort as you do these exercises. Stop right away if you feel sudden pain or your pain gets worse. Do not begin these exercises until told by your health care provider. Stretching exercises External rotation and abduction This exercise is sometimes called corner stretch. This exercise rotates your arm outward (external rotation) and moves your arm out from your body (abduction). 1. Stand in a doorway with one of your feet slightly in front of the other. This is called a staggered stance. If you cannot reach your forearms to the door frame,  stand facing a corner of a room. 2. Choose one of the following positions as told by your health care provider: ? Place your hands and forearms on the door frame above your head. ? Place your hands and forearms on the door frame at the height of your head. ? Place your hands on the door frame at the height of your elbows. 3. Slowly move your weight onto your front foot until you feel a stretch across your chest and in the front of your shoulders. Keep your head and chest upright and keep your abdominal muscles tight. 4. Hold for __________ seconds. 5. To release the stretch, shift your weight to your back foot. Repeat __________ times. Complete this exercise __________ times a day. Extension, standing 1. Stand and hold a broomstick, a cane, or a similar object behind your back. ? Your hands should be a little wider than shoulder width apart. ? Your palms should face away from your back. 2. Keeping your elbows straight and your shoulder muscles relaxed, move the stick away from your body until you feel a stretch in your shoulders (extension). ? Avoid shrugging your shoulders while you move the stick. Keep your shoulder blades tucked down  toward the middle of your back. 3. Hold for __________ seconds. 4. Slowly return to the starting position. Repeat __________ times. Complete this exercise __________ times a day. Range-of-motion exercises Pendulum  1. Stand near a wall or a surface that you can hold onto for balance. 2. Bend at the waist and let your left / right arm hang straight down. Use your other arm to support you. Keep your back straight and do not lock your knees. 3. Relax your left / right arm and shoulder muscles, and move your hips and your trunk so your left / right arm swings freely. Your arm should swing because of the motion of your body, not because you are using your arm or shoulder muscles. 4. Keep moving your hips and trunk so your arm swings in the following directions, as told by your health care provider: ? Side to side. ? Forward and backward. ? In clockwise and counterclockwise circles. 5. Continue each motion for __________ seconds, or for as long as told by your health care provider. 6. Slowly return to the starting position. Repeat __________ times. Complete this exercise __________ times a day. Shoulder flexion, standing  1. Stand and hold a broomstick, a cane, or a similar object. Place your hands a little more than shoulder width apart on the object. Your left / right hand should be palm up, and your other hand should be palm down. 2. Keep your elbow straight and your shoulder muscles relaxed. Push the stick up with your healthy arm to raise your left / right arm in front of your body, and then over your head until you feel a stretch in your shoulder (flexion). ? Avoid shrugging your shoulder while you raise your arm. Keep your shoulder blade tucked down toward the middle of your back. 3. Hold for __________ seconds. 4. Slowly return to the starting position. Repeat __________ times. Complete this exercise __________ times a day. Shoulder abduction, standing 1. Stand and hold a broomstick, a  cane, or a similar object. Place your hands a little more than shoulder width apart on the object. Your left / right hand should be palm up, and your other hand should be palm down. 2. Keep your elbow straight and your shoulder muscles relaxed. Push the object across your body  toward your left / right side. Raise your left / right arm to the side of your body (abduction) until you feel a stretch in your shoulder. ? Do not raise your arm above shoulder height unless your health care provider tells you to do that. ? If directed, raise your arm over your head. ? Avoid shrugging your shoulder while you raise your arm. Keep your shoulder blade tucked down toward the middle of your back. 3. Hold for __________ seconds. 4. Slowly return to the starting position. Repeat __________ times. Complete this exercise __________ times a day. Internal rotation  1. Place your left / right hand behind your back, palm up. 2. Use your other hand to dangle an exercise band, a towel, or a similar object over your shoulder. Grasp the band with your left / right hand so you are holding on to both ends. 3. Gently pull up on the band until you feel a stretch in the front of your left / right shoulder. The movement of your arm toward the center of your body is called internal rotation. ? Avoid shrugging your shoulder while you raise your arm. Keep your shoulder blade tucked down toward the middle of your back. 4. Hold for __________ seconds. 5. Release the stretch by letting go of the band and lowering your hands. Repeat __________ times. Complete this exercise __________ times a day. Strengthening exercises External rotation  1. Sit in a stable chair without armrests. 2. Secure an exercise band to a stable object at elbow height on your left / right side. 3. Place a soft object, such as a folded towel or a small pillow, between your left / right upper arm and your body to move your elbow about 4 inches (10 cm) away from  your side. 4. Hold the end of the exercise band so it is tight and there is no slack. 5. Keeping your elbow pressed against the soft object, slowly move your forearm out, away from your abdomen (external rotation). Keep your body steady so only your forearm moves. 6. Hold for __________ seconds. 7. Slowly return to the starting position. Repeat __________ times. Complete this exercise __________ times a day. Shoulder abduction  1. Sit in a stable chair without armrests, or stand up. 2. Hold a __________ weight in your left / right hand, or hold an exercise band with both hands. 3. Start with your arms straight down and your left / right palm facing in, toward your body. 4. Slowly lift your left / right hand out to your side (abduction). Do not lift your hand above shoulder height unless your health care provider tells you that this is safe. ? Keep your arms straight. ? Avoid shrugging your shoulder while you do this movement. Keep your shoulder blade tucked down toward the middle of your back. 5. Hold for __________ seconds. 6. Slowly lower your arm, and return to the starting position. Repeat __________ times. Complete this exercise __________ times a day. Shoulder extension 1. Sit in a stable chair without armrests, or stand up. 2. Secure an exercise band to a stable object in front of you so it is at shoulder height. 3. Hold one end of the exercise band in each hand. Your palms should face each other. 4. Straighten your elbows and lift your hands up to shoulder height. 5. Step back, away from the secured end of the exercise band, until the band is tight and there is no slack. 6. Squeeze your shoulder blades together as  you pull your hands down to the sides of your thighs (extension). Stop when your hands are straight down by your sides. Do not let your hands go behind your body. 7. Hold for __________ seconds. 8. Slowly return to the starting position. Repeat __________ times. Complete  this exercise __________ times a day. Shoulder row 1. Sit in a stable chair without armrests, or stand up. 2. Secure an exercise band to a stable object in front of you so it is at waist height. 3. Hold one end of the exercise band in each hand. Position your palms so that your thumbs are facing the ceiling (neutral position). 4. Bend each of your elbows to a 90-degree angle (right angle) and keep your upper arms at your sides. 5. Step back until the band is tight and there is no slack. 6. Slowly pull your elbows back behind you. 7. Hold for __________ seconds. 8. Slowly return to the starting position. Repeat __________ times. Complete this exercise __________ times a day. Shoulder press-ups  1. Sit in a stable chair that has armrests. Sit upright, with your feet flat on the floor. 2. Put your hands on the armrests so your elbows are bent and your fingers are pointing forward. Your hands should be about even with the sides of your body. 3. Push down on the armrests and use your arms to lift yourself off the chair. Straighten your elbows and lift yourself up as much as you comfortably can. ? Move your shoulder blades down, and avoid letting your shoulders move up toward your ears. ? Keep your feet on the ground. As you get stronger, your feet should support less of your body weight as you lift yourself up. 4. Hold for __________ seconds. 5. Slowly lower yourself back into the chair. Repeat __________ times. Complete this exercise __________ times a day. Wall push-ups  1. Stand so you are facing a stable wall. Your feet should be about one arm-length away from the wall. 2. Lean forward and place your palms on the wall at shoulder height. 3. Keep your feet flat on the floor as you bend your elbows and lean forward toward the wall. 4. Hold for __________ seconds. 5. Straighten your elbows to push yourself back to the starting position. Repeat __________ times. Complete this exercise  __________ times a day. This information is not intended to replace advice given to you by your health care provider. Make sure you discuss any questions you have with your health care provider. Document Revised: 11/30/2018 Document Reviewed: 09/07/2018 Elsevier Patient Education  [place] and Warfarin Warfarin is a blood thinner (anticoagulant). Anticoagulant medicines help prevent the formation of blood clots. These medicines work by decreasing the activity of vitamin K, which promotes normal blood clotting. When you take warfarin, problems can occur from suddenly increasing or decreasing the amount of vitamin K that you eat from one day to the next. Problems may include:  Blood clots.  Bleeding. What general guidelines do I need to follow? To avoid problems when taking warfarin:  Eat a balanced diet that includes: ? Fresh fruits and vegetables. ? Whole grains. ? Low-fat dairy products. ? Lean proteins, such as fish, eggs, and lean cuts of meat.  Keep your intake of vitamin K consistent from day to day. To do this: ? Avoid eating large amounts of vitamin K one day and low amounts of vitamin K the next day. ? If you take a multivitamin that  contains vitamin K, be sure to take it every day. ? Know which foods contain vitamin K. Use the lists below to understand serving sizes and the amount of vitamin K in one serving.  Avoid major changes in your diet. If you are going to change your diet, talk with your health care provider before making changes.  Work with a Financial planner (dietitian) to develop a meal plan that works best for you.  High vitamin K foods Foods that are high in vitamin K contain more than 100 mcg (micrograms) per serving. These include:  Broccoli (cooked) -  cup has 110 mcg.  Brussels sprouts (cooked) -  cup has 109 mcg.  Greens, beet (cooked) -  cup has 350 mcg.  Greens, collard (cooked) -  cup has 418 mcg.  Greens,  turnip (cooked) -  cup has 265 mcg.  Green onions or scallions -  cup has 105 mcg.  Kale (fresh or frozen) -  cup has 531 mcg.  Parsley (raw) - 10 sprigs has 164 mcg.  Spinach (cooked) -  cup has 444 mcg.  Swiss chard (cooked) -  cup has 287 mcg. Moderate vitamin K foods Foods that have a moderate amount of vitamin K contain 25-100 mcg per serving. These include:  Asparagus (cooked) - 5 spears have 38 mcg.  Black-eyed peas (dried) -  cup has 32 mcg.  Cabbage (cooked) -  cup has 37 mcg.  Kiwi fruit - 1 medium has 31 mcg.  Lettuce - 1 cup has 57-63 mcg.  Okra (frozen) -  cup has 44 mcg.  Prunes (dried) - 5 prunes have 25 mcg.  Watercress (raw) - 1 cup has 85 mcg. Low vitamin K foods Foods low in vitamin K contain less than 25 mcg per serving. These include:  Artichoke - 1 medium has 18 mcg.  Avocado - 1 oz. has 6 mcg.  Blueberries -  cup has 14 mcg.  Cabbage (raw) -  cup has 21 mcg.  Carrots (cooked) -  cup has 11 mcg.  Cauliflower (raw) -  cup has 11 mcg.  Cucumber with peel (raw) -  cup has 9 mcg.  Grapes -  cup has 12 mcg.  Mango - 1 medium has 9 mcg.  Nuts - 1 oz. has 15 mcg.  Pear - 1 medium has 8 mcg.  Peas (cooked) -  cup has 19 mcg.  Pickles - 1 spear has 14 mcg.  Pumpkin seeds - 1 oz. has 13 mcg.  Sauerkraut (canned) -  cup has 16 mcg.  Soybeans (cooked) -  cup has 16 mcg.  Tomato (raw) - 1 medium has 10 mcg.  Tomato sauce -  cup has 17 mcg. Vitamin K-free foods If a food contain less than 5 mcg per serving, it is considered to have no vitamin K. These foods include:  Bread and cereal products.  Cheese.  Eggs.  Fish and shellfish.  Meat and poultry.  Milk and dairy products.  Sunflower seeds. Actual amounts of vitamin K in foods may be different depending on processing. Talk with your dietitian about what foods you can eat and what foods you should avoid. This information is not intended to replace advice  given to you by your health care provider. Make sure you discuss any questions you have with your health care provider. Document Revised: 07/21/2017 Document Reviewed: 11/11/2015 Elsevier Patient Education  2020 [employer].
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
## 2020-06-05 ENCOUNTER — Telehealth: Payer: Self-pay

## 2020-06-05 DIAGNOSIS — E1169 Type 2 diabetes mellitus with other specified complication: Secondary | ICD-10-CM | POA: Insufficient documentation

## 2020-06-05 DIAGNOSIS — E669 Obesity, unspecified: Secondary | ICD-10-CM | POA: Insufficient documentation

## 2020-06-05 NOTE — Chronic Care Management (AMB) (Signed)
  Chronic Care Management   Outreach Note  06/05/2020 Name: DAISJA KESSINGER MRN: [id number] DOB: 1971/01/04  ROBERTA ANGELL is a 49 y.o. year old female who is a primary care patient of McLean-Scocuzza, Nino Glow, MD. I reached out to Danelle Earthly by phone today in response to a referral sent by Ms. Marzetta Merino [place]'s PCP, Orland Mustard, MD     An unsuccessful telephone outreach was attempted today. The patient was referred to the case management team for assistance with care management and care coordination.   Follow Up Plan: A HIPAA compliant phone message was left for the patient providing contact information and requesting a return call.  The care management team will reach out to the patient again over the next 3 days.  If patient returns call to provider office, please advise to call [facility] at [place], [place], [place], [place] [address] Direct Dial: [phone] [email or web address]

## 2020-06-08 NOTE — Chronic Care Management (AMB) (Signed)
  Chronic Care Management   Note  06/08/2020 Name: Amanda Vasquez MRN: [id number] DOB: 25-Sep-1970  Amanda Vasquez is a 49 y.o. year old female who is a primary care patient of McLean-Scocuzza, Nino Glow, MD. I reached out to Danelle Earthly by phone today in response to a referral sent by Amanda Vasquez PCP, Orland Mustard, MD     Amanda Vasquez was given information about Chronic Care Management services today including:  1. CCM service includes personalized support from designated clinical staff supervised by her physician, including individualized plan of care and coordination with other care providers 2. 24/7 contact phone numbers for assistance for urgent and routine care needs. 3. Service will only be billed when office clinical staff spend 20 minutes or more in a month to coordinate care. 4. Only one practitioner may furnish and bill the service in a calendar month. 5. The patient may stop CCM services at any time (effective at the end of the month) by phone call to the office staff. 6. The patient will be responsible for cost sharing (co-pay) of up to 20% of the service fee (after annual deductible is met).  Patient agreed to services and verbal consent obtained.   Follow up plan: Telephone appointment with care management team member scheduled for:06/10/2020  Noreene Larsson, [place], [place], [place] [address] Direct Dial: [phone] [email or web address]: [email or web address]

## 2020-06-09 DIAGNOSIS — G890 Central pain syndrome: Secondary | ICD-10-CM | POA: Diagnosis not present

## 2020-06-09 DIAGNOSIS — Z7901 Long term (current) use of anticoagulants: Secondary | ICD-10-CM | POA: Diagnosis not present

## 2020-06-09 DIAGNOSIS — G894 Chronic pain syndrome: Secondary | ICD-10-CM | POA: Diagnosis not present

## 2020-06-09 DIAGNOSIS — M62830 Muscle spasm of back: Secondary | ICD-10-CM | POA: Diagnosis not present

## 2020-06-09 DIAGNOSIS — D6861 Antiphospholipid syndrome: Secondary | ICD-10-CM | POA: Diagnosis not present

## 2020-06-09 DIAGNOSIS — Z79891 Long term (current) use of opiate analgesic: Secondary | ICD-10-CM | POA: Diagnosis not present

## 2020-06-10 ENCOUNTER — Ambulatory Visit (INDEPENDENT_AMBULATORY_CARE_PROVIDER_SITE_OTHER): Payer: Medicare Other | Admitting: Pharmacist

## 2020-06-10 DIAGNOSIS — E785 Hyperlipidemia, unspecified: Secondary | ICD-10-CM

## 2020-06-10 DIAGNOSIS — Z8639 Personal history of other endocrine, nutritional and metabolic disease: Secondary | ICD-10-CM

## 2020-06-10 DIAGNOSIS — M546 Pain in thoracic spine: Secondary | ICD-10-CM

## 2020-06-10 DIAGNOSIS — G890 Central pain syndrome: Secondary | ICD-10-CM

## 2020-06-10 DIAGNOSIS — E1169 Type 2 diabetes mellitus with other specified complication: Secondary | ICD-10-CM

## 2020-06-10 DIAGNOSIS — E1165 Type 2 diabetes mellitus with hyperglycemia: Secondary | ICD-10-CM

## 2020-06-10 NOTE — Patient Instructions (Addendum)
Visit Information  Goals Addressed              This Visit's Progress     Patient Stated   .  PharmD "I need help with medication costs" (pt-stated)        CARE PLAN ENTRY (see longitudinal plan of care for additional care plan information)  Current Barriers:  . Social, financial, community barriers:  o Notes having a difficult time affording medications. [employer] Medicare Advantage Choice PPO plan.  . Diabetes: uncontrolled; complicated by chronic medical conditions including depression and anxiety, chronic pain, migraines, periodic nausea and vomiting, gastroparesis, antiphospholipid antibody, most recent A1c 8.4% . Most recent eGFR: >100 . Current antihyperglycemic regimen: follows w/ Dr Cruzita Lederer, next appt in December; metformin 1000 mg BID, Jardiance 25 mg daily, glipizide 10 mg daily, prescribed Trulicity 1.5 mg weekly, but has not been taking d/t cost. PCP recommended continuing to hold while working up nausea/vomiting . [facility]. Notes that she has adjusted eating habits and has noticed lower glucose readings . Current blood glucose readings: o Before supper: 80-90s . Cardiovascular risk reduction: o Current hyperlipidemia regimen: atorvastatin 40 mg daily, LDL at goal <70  o Current antiplatelet regimen: n/a, warfarin d/t APL . Chronic pain, migraines: Dr. Eartha Inch Pain; Ajovy 225 mg monthly, propranolol 80 mg daily, Botox Q 3 months, morphine CR 60 mg BID,  oxycodone 5 mg Q4H PRN, baclofen 10 mg BID, gabapentin ER (Gralise) 600 mg daily, Nurtec 75 mg PRN  . Depression, anxiety: alprazolam 0.5 mg daily PRN, bupropion XL 300 mg daily, sertraline 150 mg daily, trazodone 150 mg QPM . Antiphospholipid antibody syndrome: Dr. Janese Banks; warfarin alternating 7.5 mg and 10 mg   Pharmacist Clinical Goal(s):  [name] Over the next 60 days, patient will work with PharmD and primary care provider to address medication cost  concerns  Interventions: . Comprehensive medication review performed, medication list updated in electronic medical record . Inter-disciplinary care team collaboration (see longitudinal plan of care) . Patient was unaware of PCP's recommendation to continue to hold Trulicity while working up nausea/vomiting. GLP1 may also exacerbate underlying gastroparesis. Will focus on pursuing patient assistance for Jardiance at this time, can consider alternative options moving forward. Dietary changes to pursue weight loss seem to also be having a positive impact on glucose control. Discussed risk of hypoglycemia w/ glipizide; encouraged to contact Dr. Cruzita Lederer or myself with any increase in episodes of hypoglycemia . Ajovy patient assistance is not available for patients w/ Medicare prescriptions coverage. [employer] does have an accessible assistance plan that patient would qualify for. Called Dr. Lewie Loron office, LVM to discuss this possibility  . Gralise does not have a patient assistance program. Patient verbalizes understanding.   Patient Self Care Activities:  . Patient will check blood glucose BID , document, and provide at future appointments . Patient will take medications as prescribed . Patient will contact provider with any episodes of hypoglycemia . Patient will report any questions or concerns to provider   Initial goal documentation        Amanda Vasquez was given information about Chronic Care Management services today including:  1. CCM service includes personalized support from designated clinical staff supervised by her physician, including individualized plan of care and coordination with other care providers 2. 24/7 contact phone numbers for assistance for urgent and routine care needs. 3. Service will only be billed when office clinical staff spend 20 minutes or more in a month to coordinate  care. 4. Only one practitioner may furnish and bill the service in a calendar month. 5. The  patient may stop CCM services at any time (effective at the end of the month) by phone call to the office staff. 6. The patient will be responsible for cost sharing (co-pay) of up to 20% of the service fee (after annual deductible is met).  Patient agreed to services and verbal consent obtained.   The patient verbalized understanding of instructions provided today and declined a print copy of patient instruction materials.   Plan: - Will await f/u from Dr. Hardin Negus. If I do not hear back, will outreach again next week.   Catie Darnelle Maffucci, PharmD, [place], CPP Clinical Pharmacist [place] [phone]  [place], PharmD, [place], CPP Clinical Pharmacist [place] [phone]

## 2020-06-10 NOTE — Chronic Care Management (AMB) (Signed)
Chronic Care Management   Note  06/10/2020 Name: Amanda Vasquez MRN: [id number] DOB: [birth year]   Subjective:  Amanda Vasquez is a 49 y.o. year old female who is a primary care patient of McLean-Scocuzza, Nino Glow, MD. The CCM team was consulted for assistance with chronic disease management and care coordination needs.     Contacted patient for initial medication access and medication management support  [name] was given information about Chronic Care Management services today including:  1. CCM service includes personalized support from designated clinical staff supervised by her physician, including individualized plan of care and coordination with other care providers 2. 24/7 contact phone numbers for assistance for urgent and routine care needs. 3. Service will only be billed when office clinical staff spend 20 minutes or more in a month to coordinate care. 4. Only one practitioner may furnish and bill the service in a calendar month. 5. The patient may stop CCM services at any time (effective at the end of the month) by phone call to the office staff. 6. The patient will be responsible for cost sharing (co-pay) of up to 20% of the service fee (after annual deductible is met).  Patient agreed to services and verbal consent obtained.   Review of patient status, including review of consultants reports, laboratory and other test data, was performed as part of comprehensive evaluation and provision of chronic care management services.   SDOH (Social Determinants of Health) assessments and interventions performed:  SDOH Interventions     Most Recent Value  SDOH Interventions  Financial Strain Interventions Other (Comment)  [manufacturer assistance review]       Objective:  Lab Results  Component Value Date   CREATININE 0.63 06/01/2020   CREATININE 0.74 01/21/2020   CREATININE 0.86 06/04/2019    Lab Results  Component Value Date   HGBA1C 8.6 (H) 06/01/2020         Component Value Date/Time   CHOL 121 06/01/2020 1010   TRIG 187 (H) 06/01/2020 1010   HDL 56 06/01/2020 1010   CHOLHDL 2 01/21/2020 1144   VLDL 39.2 01/21/2020 1144   LDLCALC 35 06/01/2020 1010   LDLDIRECT 80.0 09/19/2018 1139    Clinical ASCVD: No  The ASCVD Risk score Mikey Bussing DC Jr., et al., 2013) failed to calculate for the following reasons:   The valid total cholesterol range is 130 to 320 mg/dL    BP Readings from Last 3 Encounters:  06/04/20 136/84  06/01/20 (!) 118/58  05/11/20 104/60    Allergies  Allergen Reactions  . Latex Hives    Medications Reviewed Today    Reviewed by De Hollingshead, RPH-CPP (Pharmacist) on 06/10/20 at 1106  Med List Status: <None>  Medication Order Taking? Sig Documenting Provider Last Dose Status Informant  AJOVY 225 MG/1.5ML SOSY [id number] Yes INJECT 225MG INTO THE SKIN EVERY 30 DAYS. Melvenia Beam, MD Taking Active   ALPRAZolam Duanne Moron) 0.5 MG tablet [id number] Yes Take 1 tablet (0.5 mg total) by mouth daily as needed for anxiety. McLean-Scocuzza, Nino Glow, MD Taking Active   atorvastatin (LIPITOR) 40 MG tablet [id number] Yes Take 1 tablet (40 mg total) by mouth daily at 6 PM. McLean-Scocuzza, Nino Glow, MD Taking Active   baclofen (LIORESAL) 10 MG tablet [id number] Yes Take 10 mg by mouth 2 (two) times daily.  Rowe Clack, MD Taking Active Self  Botulinum Toxin Type A (BOTOX) 200 units SOLR [id number] Yes Provider to inject 155 units into the muscles of the  head and neck every 3 months. Discard remainder. Melvenia Beam, MD Taking Active   buPROPion (WELLBUTRIN XL) 300 MG 24 hr tablet [id number] Yes TAKE ONE TABLET BY MOUTH EVERY DAY McLean-Scocuzza, Nino Glow, MD Taking Active   Cetirizine HCl 10 MG CAPS [id number] Yes Take 1 capsule (10 mg total) by mouth daily as needed. Take by mouth.  Patient taking differently: Take 15 mg by mouth daily as needed. Take by mouth.   Rowe Clack, MD Taking Active Self  Cholecalciferol  (VITAMIN D3 PO) [id number] Yes Take 5,000 Units by mouth daily. [provider] Taking Active   glipiZIDE (GLUCOTROL) 10 MG tablet [id number] Yes Take 10 mg by mouth daily.  [provider] Taking Active   GRALISE 600 MG TABS [id number] Yes Take 1,800 mg by mouth every evening.  [provider] Taking Active Self           Med Note Tawni Carnes Oct 02, 2017  7:28 PM)    JARDIANCE 25 MG TABS tablet [id number] Yes TAKE ONE TABLET BY MOUTH EVERY DAY Philemon Kingdom, MD Taking Active   metFORMIN (GLUCOPHAGE) 1000 MG tablet [id number] Yes Take 1 tablet (1,000 mg total) by mouth 2 (two) times daily with a meal. McLean-Scocuzza, Nino Glow, MD Taking Active   morphine (MS CONTIN) 60 MG 12 hr tablet [id number] Yes Take 60 mg by mouth every 12 (twelve) hours. [provider] Taking Active Self  oxyCODONE (OXY IR/ROXICODONE) 5 MG immediate release tablet [id number] Yes Take 1 tablet (5 mg total) by mouth every 4 (four) hours as needed for severe pain. Per pain clinic Dr Wylie Hail, Jannifer Rodney, MD Taking Active Self  Polyethylene Glycol 3350 (MIRALAX PO) [id number] Yes Take by mouth as needed. [provider] Taking Active Self  promethazine (PHENERGAN) 25 MG tablet [id number] Yes Take 1 tablet (25 mg total) by mouth 2 (two) times daily as needed for nausea or vomiting. McLean-Scocuzza, Nino Glow, MD Taking Active   propranolol ER (INDERAL LA) 80 MG 24 hr capsule [id number] Yes TAKE 1 CAPSULE BY MOUTH ONCE DAILY Melvenia Beam, MD Taking Active   Rimegepant Sulfate (NURTEC) 75 MG TBDP [id number] Yes Take 75 mg by mouth daily as needed. For migraines. Take as close to onset of migraine as possible. One daily maximum. Melvenia Beam, MD Taking Active   sertraline (ZOLOFT) 100 MG tablet [id number] Yes Take 1.5 tablets (150 mg total) by mouth daily. McLean-Scocuzza, Nino Glow, MD Taking Active   traZODone (DESYREL) 150 MG tablet [id number] Yes Take 1 tablet (150 mg total)  by mouth at bedtime as needed. for sleep McLean-Scocuzza, Nino Glow, MD Taking Active   TRULICITY 1.5 PJ/0.9TO SOPN [id number] No Inject 1.5 mg into the skin once a week.   Patient not taking: Reported on 06/04/2020   [provider] Not Taking Active   warfarin (COUMADIN) 10 MG tablet [id number] Yes TAKE 1 TABLET BY MOUTH DAILY FOR 6 DAYS A WEEK Sindy Guadeloupe, MD Taking Active   warfarin (COUMADIN) 7.5 MG tablet [id number] Yes Take 1 tablet (7.5 mg total) by mouth as directed. 7.59m ( 1 1/2 tablets) 4 times a week, and uses 10 mg in separate rx  Patient taking differently: Take 7.5 mg by mouth as directed. 7.5 mg 5 times a week, and uses 10 mg in separate rx   RSindy Guadeloupe MD Taking Active            Assessment:   Goals  Addressed              This Visit's Progress     Patient Stated   .  PharmD "I need help with medication costs" (pt-stated)        CARE PLAN ENTRY (see longitudinal plan of care for additional care plan information)  Current Barriers:  . Social, financial, community barriers:  o Notes having a difficult time affording medications. [employer] Medicare Advantage Choice PPO plan.  . Diabetes: uncontrolled; complicated by chronic medical conditions including depression and anxiety, chronic pain, migraines, periodic nausea and vomiting, gastroparesis, antiphospholipid antibody, most recent A1c 8.4% . Most recent eGFR: >100 . Current antihyperglycemic regimen: follows w/ Dr Cruzita Lederer, next appt in December; metformin 1000 mg BID, Jardiance 25 mg daily, glipizide 10 mg daily, prescribed Trulicity 1.5 mg weekly, but has not been taking d/t cost. PCP recommended continuing to hold while working up nausea/vomiting . [facility]/ [facility]. Notes that she has adjusted eating habits and has noticed lower glucose readings . Current blood glucose readings: o Before supper: 80-90s . Cardiovascular risk reduction: o Current hyperlipidemia regimen:  atorvastatin 40 mg daily, LDL at goal <70  o Current antiplatelet regimen: n/a, warfarin d/t APL . Chronic pain, migraines: Dr. Eartha Inch Pain; Ajovy 225 mg monthly, propranolol 80 mg daily, Botox Q 3 months, morphine CR 60 mg BID,  oxycodone 5 mg Q4H PRN, baclofen 10 mg BID, gabapentin ER (Gralise) 600 mg daily, Nurtec 75 mg PRN  . Depression, anxiety: alprazolam 0.5 mg daily PRN, bupropion XL 300 mg daily, sertraline 150 mg daily, trazodone 150 mg QPM . Antiphospholipid antibody syndrome: Dr. Janese Banks; warfarin alternating 7.5 mg and 10 mg   Pharmacist Clinical Goal(s):  [name] Over the next 60 days, patient will work with PharmD and primary care provider to address medication cost concerns  Interventions: . Comprehensive medication review performed, medication list updated in electronic medical record . Inter-disciplinary care team collaboration (see longitudinal plan of care) . Patient was unaware of PCP's recommendation to continue to hold Trulicity while working up nausea/vomiting. GLP1 may also exacerbate underlying gastroparesis. Will focus on pursuing patient assistance for Jardiance at this time, can consider alternative options moving forward. Dietary changes to pursue weight loss seem to also be having a positive impact on glucose control. Discussed risk of hypoglycemia w/ glipizide; encouraged to contact Dr. Cruzita Lederer or myself with any increase in episodes of hypoglycemia . Ajovy patient assistance is not available for patients w/ Medicare prescriptions coverage. [employer] does have an accessible assistance plan that patient would qualify for. Called Dr. Lewie Loron office, LVM to discuss this possibility  . Gralise does not have a patient assistance program. Patient verbalizes understanding.   Patient Self Care Activities:  . Patient will check blood glucose BID , document, and provide at future appointments . Patient will take medications as prescribed . Patient will contact provider  with any episodes of hypoglycemia . Patient will report any questions or concerns to provider   Initial goal documentation        Plan: - Will await f/u from Dr. Hardin Negus. If I do not hear back, will outreach again next week.   Catie Darnelle Maffucci, PharmD, [place], CPP Clinical Pharmacist [place] [phone]

## 2020-06-11 ENCOUNTER — Encounter: Payer: Self-pay | Admitting: Oncology

## 2020-06-11 ENCOUNTER — Other Ambulatory Visit: Payer: Self-pay | Admitting: Neurology

## 2020-06-11 ENCOUNTER — Ambulatory Visit: Payer: Medicare Other | Admitting: Pharmacist

## 2020-06-11 DIAGNOSIS — F308 Other manic episodes: Secondary | ICD-10-CM

## 2020-06-11 DIAGNOSIS — E1169 Type 2 diabetes mellitus with other specified complication: Secondary | ICD-10-CM | POA: Diagnosis not present

## 2020-06-11 DIAGNOSIS — E1165 Type 2 diabetes mellitus with hyperglycemia: Secondary | ICD-10-CM | POA: Diagnosis not present

## 2020-06-11 DIAGNOSIS — E785 Hyperlipidemia, unspecified: Secondary | ICD-10-CM | POA: Diagnosis not present

## 2020-06-11 DIAGNOSIS — G43711 Chronic migraine without aura, intractable, with status migrainosus: Secondary | ICD-10-CM

## 2020-06-11 LAB — PROTIME-INR: INR: 3.1 — AB (ref 0.9–1.1)

## 2020-06-11 NOTE — Chronic Care Management (AMB) (Signed)
Chronic Care Management   Follow Up Note   06/11/2020 Name: ARUNA NESTLER MRN: [id number] DOB: Aug 22, 1971  Referred by: McLean-Scocuzza, Nino Glow, MD Reason for referral : Chronic Care Management (Medication Management)   SWAYZE KOZUCH is a 49 y.o. year old female who is a primary care patient of McLean-Scocuzza, Nino Glow, MD. The CCM team was consulted for assistance with chronic disease management and care coordination needs.    Care coordination today.  Review of patient status, including review of consultants reports, relevant laboratory and other test results, and collaboration with appropriate care team members and the patient's provider was performed as part of comprehensive patient evaluation and provision of chronic care management services.    SDOH (Social Determinants of Health) assessments performed: Yes See Care Plan activities for detailed interventions related to [facility])     Outpatient Encounter Medications as of 06/11/2020  Medication Sig   AJOVY 225 MG/1.5ML SOSY INJECT $RemoveBef'225MG'[id number]$  INTO THE SKIN EVERY 30 DAYS.   ALPRAZolam (XANAX) 0.5 MG tablet Take 1 tablet (0.5 mg total) by mouth daily as needed for anxiety.   atorvastatin (LIPITOR) 40 MG tablet Take 1 tablet (40 mg total) by mouth daily at 6 PM.   baclofen (LIORESAL) 10 MG tablet Take 10 mg by mouth 2 (two) times daily.    Botulinum Toxin Type A (BOTOX) 200 units SOLR Provider to inject 155 units into the muscles of the head and neck every 3 months. Discard remainder.   buPROPion (WELLBUTRIN XL) 300 MG 24 hr tablet TAKE ONE TABLET BY MOUTH EVERY DAY   Cetirizine HCl 10 MG CAPS Take 1 capsule (10 mg total) by mouth daily as needed. Take by mouth. (Patient taking differently: Take 15 mg by mouth daily as needed. Take by mouth.)   Cholecalciferol (VITAMIN D3 PO) Take 5,000 Units by mouth daily.   glipiZIDE (GLUCOTROL) 10 MG tablet Take 10 mg by mouth daily.    GRALISE 600 MG TABS Take 1,800 mg by mouth every evening.     JARDIANCE 25 MG TABS tablet TAKE ONE TABLET BY MOUTH EVERY DAY   metFORMIN (GLUCOPHAGE) 1000 MG tablet Take 1 tablet (1,000 mg total) by mouth 2 (two) times daily with a meal.   morphine (MS CONTIN) 60 MG 12 hr tablet Take 60 mg by mouth every 12 (twelve) hours.   oxyCODONE (OXY IR/ROXICODONE) 5 MG immediate release tablet Take 1 tablet (5 mg total) by mouth every 4 (four) hours as needed for severe pain. Per pain clinic Dr Hardin Negus   Polyethylene Glycol 3350 (MIRALAX PO) Take by mouth as needed.   promethazine (PHENERGAN) 25 MG tablet Take 1 tablet (25 mg total) by mouth 2 (two) times daily as needed for nausea or vomiting.   propranolol ER (INDERAL LA) 80 MG 24 hr capsule TAKE 1 CAPSULE BY MOUTH ONCE DAILY   Rimegepant Sulfate (NURTEC) 75 MG TBDP Take 75 mg by mouth daily as needed. For migraines. Take as close to onset of migraine as possible. One daily maximum.   sertraline (ZOLOFT) 100 MG tablet Take 1.5 tablets (150 mg total) by mouth daily.   traZODone (DESYREL) 150 MG tablet Take 1 tablet (150 mg total) by mouth at bedtime as needed. for sleep   TRULICITY 1.5 TM/1.9QQ SOPN Inject 1.5 mg into the skin once a week.  (Patient not taking: Reported on 06/04/2020)   warfarin (COUMADIN) 10 MG tablet TAKE 1 TABLET BY MOUTH DAILY FOR 6 DAYS A WEEK   warfarin (COUMADIN) 7.5  MG tablet Take 1 tablet (7.5 mg total) by mouth as directed. 7.$RemoveBefore'5mg'[id number]$  ( 1 1/2 tablets) 4 times a week, and uses 10 mg in separate rx (Patient taking differently: Take 7.5 mg by mouth as directed. 7.5 mg 5 times a week, and uses 10 mg in separate rx)   No facility-administered encounter medications on file as of 06/11/2020.     Objective:   Goals Addressed              This Visit's Progress     Patient Stated     PharmD "I need help with medication costs" (pt-stated)        CARE PLAN ENTRY (see longitudinal plan of care for additional care plan information)  Current Barriers:   Social, financial,  community barriers:  o Working on medication access concerns.   Diabetes: uncontrolled; complicated by chronic medical conditions including depression and anxiety, chronic pain, migraines, periodic nausea and vomiting, gastroparesis, antiphospholipid antibody, most recent A1c 8.4%  Most recent eGFR: >100  Current antihyperglycemic regimen: follows w/ Dr Cruzita Lederer, next appt in December; metformin 1000 mg BID, Jardiance 25 mg daily, glipizide 10 mg daily, prescribed Trulicity 1.5 mg weekly, but has not been taking d/t cost. PCP recommended continuing to hold while working up nausea/vomiting o Working on [employer] assistance through [employer]  Cardiovascular risk reduction: o Current hyperlipidemia regimen: atorvastatin 40 mg daily, LDL at goal <70  o Current antiplatelet regimen: n/a, warfarin d/t APL  Chronic pain: Dr. Eartha Inch Pain; morphine CR 60 mg BID,  oxycodone 5 mg Q4H PRN, baclofen 10 mg BID, gabapentin ER (Gralise) 600 mg daily  Migraines: Dr Jaynee Eagles; Arie Sabina 225 mg monthly, Botox Q3 months, Nurtec 75 mg PRN; propranolol 80 mg daily o Patient does not qualify for Ajovy assistance, but does qualify for [employer]   Depression, anxiety: alprazolam 0.5 mg daily PRN, bupropion XL 300 mg daily, sertraline 150 mg daily, trazodone 150 mg QPM  Antiphospholipid antibody syndrome: Dr. Janese Banks; warfarin alternating 7.5 mg and 10 mg   Pharmacist Clinical Goal(s):   Over the next 60 days, patient will work with PharmD and primary care provider to address medication cost concerns  Interventions:  Comprehensive medication review performed, medication list updated in electronic medical record  Inter-disciplinary care team collaboration (see longitudinal plan of care)  Collaborating w/ CPhT to mail patient portion of application. Will collaborate w/ PCP for [facility] provider signature, will collaborate with neurology for [facility] application  Patient Self Care Activities:   Patient will check  blood glucose BID , document, and provide at future appointments  Patient will take medications as prescribed  Patient will contact provider with any episodes of hypoglycemia  Patient will report any questions or concerns to provider   Please see past updates related to this goal by clicking on the "Past Updates" button in the selected goal          Plan:  - Scheduled f/u call in ~ 6 weeks  Catie Darnelle Maffucci, PharmD, [place], [place] Pharmacist [place] [phone]

## 2020-06-11 NOTE — Patient Instructions (Signed)
Visit Information  Goals Addressed              This Visit's Progress     Patient Stated   .  PharmD "I need help with medication costs" (pt-stated)        CARE PLAN ENTRY (see longitudinal plan of care for additional care plan information)  Current Barriers:  . Social, financial, community barriers:  o Working on medication access concerns.  . Diabetes: uncontrolled; complicated by chronic medical conditions including depression and anxiety, chronic pain, migraines, periodic nausea and vomiting, gastroparesis, antiphospholipid antibody, most recent A1c 8.4% . Most recent eGFR: >100 . Current antihyperglycemic regimen: follows w/ Dr Cruzita Lederer, next appt in December; metformin 1000 mg BID, Jardiance 25 mg daily, glipizide 10 mg daily, prescribed Trulicity 1.5 mg weekly, but has not been taking d/t cost. PCP recommended continuing to hold while working up nausea/vomiting o Working on [employer] assistance through [employer] . Cardiovascular risk reduction: o Current hyperlipidemia regimen: atorvastatin 40 mg daily, LDL at goal <70  o Current antiplatelet regimen: n/a, warfarin d/t APL . Chronic pain: Dr. Eartha Inch Pain; morphine CR 60 mg BID,  oxycodone 5 mg Q4H PRN, baclofen 10 mg BID, gabapentin ER (Gralise) 600 mg daily . Migraines: Dr Jaynee Eagles; Arie Sabina 225 mg monthly, Botox Q3 months, Nurtec 75 mg PRN; propranolol 80 mg daily o Patient does not qualify for Ajovy assistance, but does qualify for [employer]  . Depression, anxiety: alprazolam 0.5 mg daily PRN, bupropion XL 300 mg daily, sertraline 150 mg daily, trazodone 150 mg QPM . Antiphospholipid antibody syndrome: Dr. Janese Banks; warfarin alternating 7.5 mg and 10 mg   Pharmacist Clinical Goal(s):  [name] Over the next 60 days, patient will work with PharmD and primary care provider to address medication cost concerns  Interventions: . Comprehensive medication review performed, medication list updated in electronic medical  record . Inter-disciplinary care team collaboration (see longitudinal plan of care) . Collaborating w/ CPhT to mail patient portion of application. Will collaborate w/ PCP for Jardiance provider signature, will collaborate with neurology for [facility] application  Patient Self Care Activities:  . Patient will check blood glucose BID , document, and provide at future appointments . Patient will take medications as prescribed . Patient will contact provider with any episodes of hypoglycemia . Patient will report any questions or concerns to provider   Please see past updates related to this goal by clicking on the "Past Updates" button in the selected goal         The patient verbalized understanding of instructions provided today and declined a print copy of patient instruction materials.      Plan:  - Scheduled f/u call in ~ 6 weeks  Catie Darnelle Maffucci, PharmD, [place], [place] Pharmacist [place] [phone]

## 2020-06-12 ENCOUNTER — Encounter: Payer: Self-pay | Admitting: *Deleted

## 2020-06-15 ENCOUNTER — Encounter: Payer: Self-pay | Admitting: Gastroenterology

## 2020-06-15 ENCOUNTER — Ambulatory Visit (INDEPENDENT_AMBULATORY_CARE_PROVIDER_SITE_OTHER): Payer: Medicare Other | Admitting: Family Medicine

## 2020-06-15 ENCOUNTER — Encounter (INDEPENDENT_AMBULATORY_CARE_PROVIDER_SITE_OTHER): Payer: Self-pay | Admitting: Family Medicine

## 2020-06-15 ENCOUNTER — Other Ambulatory Visit: Payer: Self-pay

## 2020-06-15 VITALS — BP 105/63 | HR 72 | Temp 98.7°F | Ht 65.0 in | Wt 221.0 lb

## 2020-06-15 DIAGNOSIS — E785 Hyperlipidemia, unspecified: Secondary | ICD-10-CM | POA: Diagnosis not present

## 2020-06-15 DIAGNOSIS — E1165 Type 2 diabetes mellitus with hyperglycemia: Secondary | ICD-10-CM

## 2020-06-15 DIAGNOSIS — Z6836 Body mass index (BMI) 36.0-36.9, adult: Secondary | ICD-10-CM | POA: Diagnosis not present

## 2020-06-15 DIAGNOSIS — E559 Vitamin D deficiency, unspecified: Secondary | ICD-10-CM

## 2020-06-15 DIAGNOSIS — E1169 Type 2 diabetes mellitus with other specified complication: Secondary | ICD-10-CM | POA: Diagnosis not present

## 2020-06-17 ENCOUNTER — Encounter: Payer: Self-pay | Admitting: Oncology

## 2020-06-17 LAB — PROTIME-INR: INR: 2.9 — AB (ref 0.9–1.1)

## 2020-06-17 MED ORDER — ERGOCALCIFEROL 1.25 MG (50000 UT) PO CAPS: 50000.0000 [IU] | ORAL_CAPSULE | ORAL | 0 refills | Status: DC

## 2020-06-17 NOTE — Progress Notes (Signed)
Chief Complaint:   OBESITY Kindle is here to discuss her progress with her obesity treatment plan along with follow-up of her obesity related diagnoses. Norely is on the [place] and states she is following her eating plan approximately 100% of the time. Skyann states she is walking the dog for 30-45 minutes 2 times per week.  Today's visit was #: 2 Starting weight: 231 lbs Starting date: 06/01/2020 Today's weight: 221 lbs Today's date: 06/15/2020 Total lbs lost to date: 10 Total lbs lost since last in-office visit: 10  Interim History: Rethel comes in for the first follow up. Feels she knows how to lose weight but struggles with keeping it off. Quantity of food at meals was ok. She has been incorporating intermittent fasting into the meal plan. She doesn't always have much energy but got all of the food in on the plan and made it so she always has something prepared.  Subjective:   1. Type 2 diabetes mellitus with hyperglycemia, without long-term current use of insulin ([name]) [facility]'s fasting BGs range between 80 and 90. She is on Jardiance, metformin, and glipizde. She denies hypoglycemia. Last A1c was 8.6 and insulin 24.3. I discussed labs with the patient today.  2. Vitamin D deficiency Jaonna denies nausea, vomiting, or muscle weakness, but notes fatigue. She is on 5,000 IU daily Vit D. Last Vit D level was 32.2.  3. Hyperlipidemia associated with type 2 diabetes mellitus ([place]) Rozell is on statin (Lipitor), and last LDL was 35, HDL 56, and triglycerides 187.  Assessment/Plan:   1. Type 2 diabetes mellitus with hyperglycemia, without long-term current use of insulin ([name]) Good blood sugar control is important to decrease the likelihood of diabetic complications such as nephropathy, neuropathy, limb loss, blindness, coronary artery disease, and death. Intensive lifestyle modification including diet, exercise and weight loss are the first line of treatment for diabetes. Shikha agreed to  discontinue glipizide, and will follow up as directed.  2. Vitamin D deficiency Low Vitamin D level contributes to fatigue and are associated with obesity, breast, and colon cancer. Titania agreed to start prescription Vitamin D 50,000 IU every week with no refills. She will follow-up for routine testing of Vitamin D, at least 2-3 times per year to avoid over-replacement.  - ergocalciferol (VITAMIN D2) 1.25 MG (50000 UT) capsule; Take 1 capsule (50,000 Units total) by mouth once a week.  Dispense: 4 capsule; Refill: 0  3. Hyperlipidemia associated with type 2 diabetes mellitus ([place]) Cardiovascular risk and specific lipid/LDL goals reviewed. We discussed several lifestyle modifications today. Carrine will continue statin, no refill needed. She will continue to work on diet, exercise and weight loss efforts. Orders and follow up as documented in patient record.   Counseling Intensive lifestyle modifications are the first line treatment for this issue. . Dietary changes: Increase soluble fiber. Decrease simple carbohydrates. . Exercise changes: Moderate to vigorous-intensity aerobic activity 150 minutes per week if tolerated. . Lipid-lowering medications: see documented in medical record.  4. Class 2 severe obesity with serious comorbidity and body mass index (BMI) of 36.0 to 36.9 in adult, unspecified obesity type [facility]) Jodilyn is currently in the action stage of change. As such, her goal is to continue with weight loss efforts. She has agreed to the [facility] + 400 calories.   Exercise goals: As is.  Behavioral modification strategies: increasing lean protein intake, meal planning and cooking strategies and keeping healthy foods in the home.  Maricruz has agreed to follow-up with  our clinic in 2 weeks. She was informed of the importance of frequent follow-up visits to maximize her success with intensive lifestyle modifications for her multiple health conditions.   Objective:   Blood pressure  105/63, pulse 72, temperature 98.7 F (37.1 C), height 5\' 5"  (1.651 m), weight 221 lb (100.2 kg), last menstrual period 03/05/2018, SpO2 96 %. Body mass index is 36.78 kg/m.  General: Cooperative, alert, well developed, in no acute distress. HEENT: Conjunctivae and lids unremarkable. Cardiovascular: Regular rhythm.  Lungs: Normal work of breathing. Neurologic: No focal deficits.   Lab Results  Component Value Date   CREATININE 0.63 06/01/2020   BUN 9 06/01/2020   NA 142 06/01/2020   K 4.3 06/01/2020   CL 103 06/01/2020   CO2 23 06/01/2020   Lab Results  Component Value Date   ALT 17 06/01/2020   AST 20 06/01/2020   ALKPHOS 72 06/01/2020   BILITOT <0.2 06/01/2020   Lab Results  Component Value Date   HGBA1C 8.6 (H) 06/01/2020   HGBA1C 7.3 (H) 01/21/2020   HGBA1C 6.7 (A) 02/18/2019   HGBA1C 7.8 (H) 09/19/2018   HGBA1C 8.0 (A) 05/31/2018   Lab Results  Component Value Date   INSULIN 24.3 06/01/2020   Lab Results  Component Value Date   TSH 1.450 06/01/2020   Lab Results  Component Value Date   CHOL 121 06/01/2020   HDL 56 06/01/2020   LDLCALC 35 06/01/2020   LDLDIRECT 80.0 09/19/2018   TRIG 187 (H) 06/01/2020   CHOLHDL 2 01/21/2020   Lab Results  Component Value Date   WBC 5.4 06/01/2020   HGB 11.8 06/01/2020   HCT 37.0 06/01/2020   MCV 87 06/01/2020   PLT 269 06/01/2020   Lab Results  Component Value Date   IRON 65 05/06/2010    Obesity Behavioral Intervention:   Approximately 15 minutes were spent on the discussion below.  ASK: We discussed the diagnosis of obesity with Lyna today and Sloka agreed to give [place] permission to discuss obesity behavioral modification therapy today.  ASSESS: Tanya has the diagnosis of obesity and her BMI today is 36.78. Ethlyn is in the action stage of change.   ADVISE: Lucillie was educated on the multiple health risks of obesity as well as the benefit of weight loss to improve her health. She was advised of the need for  long term treatment and the importance of lifestyle modifications to improve her current health and to decrease her risk of future health problems.  AGREE: Multiple dietary modification options and treatment options were discussed and Kamyra agreed to follow the recommendations documented in the above note.  ARRANGE: Lavaughn was educated on the importance of frequent visits to treat obesity as outlined per CMS and USPSTF guidelines and agreed to schedule her next follow up appointment today.  Attestation Statements:   Reviewed by clinician on day of visit: allergies, medications, problem list, medical history, surgical history, family history, social history, and previous encounter notes.   I, Trixie Dredge, am acting as transcriptionist for Coralie Common, MD.  I have reviewed the above documentation for accuracy and completeness, and I agree with the above. - Jinny Blossom, MD

## 2020-06-18 ENCOUNTER — Telehealth: Payer: Self-pay | Admitting: *Deleted

## 2020-06-18 NOTE — Telephone Encounter (Signed)
Received fax from [employer] aplication. Signed, faxed back and received confirmation.

## 2020-06-22 DIAGNOSIS — M7541 Impingement syndrome of right shoulder: Secondary | ICD-10-CM | POA: Diagnosis not present

## 2020-06-25 ENCOUNTER — Encounter: Payer: Self-pay | Admitting: Oncology

## 2020-06-25 LAB — PROTIME-INR: INR: 5.8 — AB (ref 0.9–1.1)

## 2020-06-29 ENCOUNTER — Encounter: Payer: Self-pay | Admitting: Oncology

## 2020-06-29 LAB — PROTIME-INR: INR: 2.1 — AB (ref 0.9–1.1)

## 2020-07-01 ENCOUNTER — Ambulatory Visit (INDEPENDENT_AMBULATORY_CARE_PROVIDER_SITE_OTHER): Payer: Medicare Other | Admitting: Family Medicine

## 2020-07-01 ENCOUNTER — Other Ambulatory Visit: Payer: Self-pay

## 2020-07-01 ENCOUNTER — Encounter (INDEPENDENT_AMBULATORY_CARE_PROVIDER_SITE_OTHER): Payer: Self-pay | Admitting: Family Medicine

## 2020-07-01 VITALS — BP 105/65 | HR 70 | Temp 98.2°F | Ht 65.0 in | Wt 209.0 lb

## 2020-07-01 DIAGNOSIS — E6609 Other obesity due to excess calories: Secondary | ICD-10-CM | POA: Diagnosis not present

## 2020-07-01 DIAGNOSIS — E785 Hyperlipidemia, unspecified: Secondary | ICD-10-CM | POA: Diagnosis not present

## 2020-07-01 DIAGNOSIS — E1169 Type 2 diabetes mellitus with other specified complication: Secondary | ICD-10-CM

## 2020-07-01 DIAGNOSIS — E1165 Type 2 diabetes mellitus with hyperglycemia: Secondary | ICD-10-CM | POA: Diagnosis not present

## 2020-07-01 DIAGNOSIS — Z6834 Body mass index (BMI) 34.0-34.9, adult: Secondary | ICD-10-CM

## 2020-07-02 NOTE — Progress Notes (Signed)
Chief Complaint:   OBESITY Amanda Vasquez is here to discuss her progress with her obesity treatment plan along with follow-up of her obesity related diagnoses. Amanda Vasquez is on the [place] + 400 calories and states she is following her eating plan approximately 75% of the time. Amanda Vasquez states she is walking for 30 minutes 5-7 times per week.  Today's visit was #: 3 Starting weight: 231 lbs Starting date: 06/01/2020 Today's weight: 209 lbs Today's date: 07/01/2020 Total lbs lost to date: 22 Total lbs lost since last in-office visit: 12  Interim History: Amanda Vasquez reports she isn't getting all of the food in. She thinks in order to lose weight she has to choose less flavorful options. Wants to incorporate more fruit. She wants to do more flavorful bread options instead of 45 calorie bread. She doesn't feel hunger the way she used to.  Subjective:   1. Type 2 diabetes mellitus with hyperglycemia, without long-term current use of insulin (Amanda Vasquez) Amanda Vasquez stopped glipizide, and her highest BGs are 149 and lowest 74. She is now on Jardiance and metformin.  2. Hyperlipidemia associated with type 2 diabetes mellitus (Amanda Vasquez) Amanda Vasquez is on statin with no change in dose. Her LFTs are within normal limits.  Assessment/Plan:   1. Type 2 diabetes mellitus with hyperglycemia, without long-term current use of insulin (Amanda Vasquez) Good blood sugar control is important to decrease the likelihood of diabetic complications such as nephropathy, neuropathy, limb loss, blindness, coronary artery disease, and death. Intensive lifestyle modification including diet, exercise and weight loss are the first line of treatment for diabetes. Amanda Vasquez will continue Jardiance and metformin, and continue to monitor her BGs.  2. Hyperlipidemia associated with type 2 diabetes mellitus (Amanda Vasquez) Cardiovascular risk and specific lipid/LDL goals reviewed.  We discussed several lifestyle modifications today. Amanda Vasquez will continue statin, and will continue to work  on diet, exercise and weight loss efforts. Orders and follow up as documented in patient record.   Counseling Intensive lifestyle modifications are the first line treatment for this issue. . Dietary changes: Increase soluble fiber. Decrease simple carbohydrates. . Exercise changes: Moderate to vigorous-intensity aerobic activity 150 minutes per week if tolerated. . Lipid-lowering medications: see documented in medical record.  3. Class 1 obesity due to excess calories with serious comorbidity and body mass index (BMI) of 34.0 to 34.9 in adult Amanda Vasquez is currently in the action stage of change. As such, her goal is to continue with weight loss efforts. She has agreed to the [employer] + 400 calories.   Exercise goals: As is.  Behavioral modification strategies: increasing lean protein intake, meal planning and cooking strategies, keeping healthy foods in the home and holiday eating strategies .  Amanda Vasquez has agreed to follow-up with our clinic in 2 weeks. She was informed of the importance of frequent follow-up visits to maximize her success with intensive lifestyle modifications for her multiple health conditions.   Objective:   Blood pressure 105/65, pulse 70, temperature 98.2 F (36.8 C), temperature source Oral, height 5\' 5"  (1.651 m), weight 209 lb (94.8 kg), last menstrual period 03/05/2018, SpO2 97 %. Body mass index is 34.78 kg/m.  General: Cooperative, alert, well developed, in no acute distress. HEENT: Conjunctivae and lids unremarkable. Cardiovascular: Regular rhythm.  Lungs: Normal work of breathing. Neurologic: No focal deficits.   Lab Results  Component Value Date   CREATININE 0.63 06/01/2020   BUN 9 06/01/2020   NA 142 06/01/2020   K 4.3 06/01/2020   CL 103 06/01/2020  CO2 23 06/01/2020   Lab Results  Component Value Date   ALT 17 06/01/2020   AST 20 06/01/2020   ALKPHOS 72 06/01/2020   BILITOT <0.2 06/01/2020   Lab Results  Component Value Date   HGBA1C  8.6 (H) 06/01/2020   HGBA1C 7.3 (H) 01/21/2020   HGBA1C 6.7 (A) 02/18/2019   HGBA1C 7.8 (H) 09/19/2018   HGBA1C 8.0 (A) 05/31/2018   Lab Results  Component Value Date   INSULIN 24.3 06/01/2020   Lab Results  Component Value Date   TSH 1.450 06/01/2020   Lab Results  Component Value Date   CHOL 121 06/01/2020   HDL 56 06/01/2020   LDLCALC 35 06/01/2020   LDLDIRECT 80.0 09/19/2018   TRIG 187 (H) 06/01/2020   CHOLHDL 2 01/21/2020   Lab Results  Component Value Date   WBC 5.4 06/01/2020   HGB 11.8 06/01/2020   HCT 37.0 06/01/2020   MCV 87 06/01/2020   PLT 269 06/01/2020   Lab Results  Component Value Date   IRON 65 05/06/2010   Attestation Statements:   Reviewed by clinician on day of visit: allergies, medications, problem list, medical history, surgical history, family history, social history, and previous encounter notes.  Time spent on visit including pre-visit chart review and post-visit care and charting was 17 minutes.    I, Trixie Dredge, am acting as transcriptionist for Coralie Common, MD.  I have reviewed the above documentation for accuracy and completeness, and I agree with the above. - Jinny Blossom, MD

## 2020-07-07 ENCOUNTER — Encounter: Payer: Self-pay | Admitting: Oncology

## 2020-07-07 DIAGNOSIS — Z7901 Long term (current) use of anticoagulants: Secondary | ICD-10-CM | POA: Diagnosis not present

## 2020-07-07 DIAGNOSIS — Z79891 Long term (current) use of opiate analgesic: Secondary | ICD-10-CM | POA: Diagnosis not present

## 2020-07-07 DIAGNOSIS — G894 Chronic pain syndrome: Secondary | ICD-10-CM | POA: Diagnosis not present

## 2020-07-07 DIAGNOSIS — D6861 Antiphospholipid syndrome: Secondary | ICD-10-CM | POA: Diagnosis not present

## 2020-07-07 DIAGNOSIS — M62830 Muscle spasm of back: Secondary | ICD-10-CM | POA: Diagnosis not present

## 2020-07-07 DIAGNOSIS — G890 Central pain syndrome: Secondary | ICD-10-CM | POA: Diagnosis not present

## 2020-07-07 LAB — PROTIME-INR: INR: 5 — AB (ref 0.9–1.1)

## 2020-07-08 ENCOUNTER — Encounter: Payer: Self-pay | Admitting: *Deleted

## 2020-07-08 ENCOUNTER — Other Ambulatory Visit: Payer: Self-pay | Admitting: *Deleted

## 2020-07-08 DIAGNOSIS — D6861 Antiphospholipid syndrome: Secondary | ICD-10-CM

## 2020-07-08 MED ORDER — WARFARIN SODIUM 7.5 MG PO TABS: 7.5000 mg | ORAL_TABLET | ORAL | 1 refills | Status: DC

## 2020-07-08 NOTE — Progress Notes (Signed)
Patient called.  Lmom with the recommendation, also to call office for confirmation of message.SJC

## 2020-07-09 ENCOUNTER — Other Ambulatory Visit: Payer: Self-pay | Admitting: *Deleted

## 2020-07-09 DIAGNOSIS — D6861 Antiphospholipid syndrome: Secondary | ICD-10-CM

## 2020-07-09 MED ORDER — WARFARIN SODIUM 7.5 MG PO TABS: ORAL_TABLET | ORAL | 1 refills | Status: DC

## 2020-07-14 ENCOUNTER — Encounter: Payer: Self-pay | Admitting: Oncology

## 2020-07-14 ENCOUNTER — Ambulatory Visit (INDEPENDENT_AMBULATORY_CARE_PROVIDER_SITE_OTHER): Payer: Medicare Other | Admitting: Bariatrics

## 2020-07-15 LAB — PROTIME-INR: INR: 3.3 — AB (ref 0.9–1.1)

## 2020-07-20 ENCOUNTER — Encounter: Payer: Self-pay | Admitting: *Deleted

## 2020-07-21 ENCOUNTER — Ambulatory Visit (INDEPENDENT_AMBULATORY_CARE_PROVIDER_SITE_OTHER): Payer: Medicare Other | Admitting: Bariatrics

## 2020-07-21 ENCOUNTER — Encounter: Payer: Self-pay | Admitting: Oncology

## 2020-07-21 ENCOUNTER — Other Ambulatory Visit: Payer: Self-pay

## 2020-07-21 ENCOUNTER — Encounter (INDEPENDENT_AMBULATORY_CARE_PROVIDER_SITE_OTHER): Payer: Self-pay | Admitting: Bariatrics

## 2020-07-21 VITALS — BP 89/59 | HR 80 | Temp 98.2°F | Ht 65.0 in | Wt 202.0 lb

## 2020-07-21 DIAGNOSIS — E538 Deficiency of other specified B group vitamins: Secondary | ICD-10-CM | POA: Diagnosis not present

## 2020-07-21 DIAGNOSIS — E785 Hyperlipidemia, unspecified: Secondary | ICD-10-CM | POA: Diagnosis not present

## 2020-07-21 DIAGNOSIS — E1169 Type 2 diabetes mellitus with other specified complication: Secondary | ICD-10-CM

## 2020-07-21 DIAGNOSIS — E669 Obesity, unspecified: Secondary | ICD-10-CM

## 2020-07-21 DIAGNOSIS — E559 Vitamin D deficiency, unspecified: Secondary | ICD-10-CM

## 2020-07-21 DIAGNOSIS — Z6833 Body mass index (BMI) 33.0-33.9, adult: Secondary | ICD-10-CM | POA: Diagnosis not present

## 2020-07-21 MED ORDER — ERGOCALCIFEROL 1.25 MG (50000 UT) PO CAPS: 50000.0000 [IU] | ORAL_CAPSULE | ORAL | 0 refills | Status: DC

## 2020-07-21 NOTE — Progress Notes (Signed)
Chief Complaint:   Granby is here to discuss her progress with her obesity treatment plan along with follow-up of her obesity related diagnoses. Haedyn is on the [place] and states she is following her eating plan approximately 95% of the time. Tayva states she is walking 30 minutes 7 times per week.  Today's visit was #: 4 Starting weight: 231 lbs Starting date: 06/01/2020 Today's weight: 202 lbs Today's date: 07/21/2020 Total lbs lost to date: 29 Total lbs lost since last in-office visit: 7  Interim History: Iana is down an additional 7 lbs since her last visit. She has been on the [place], but wants something different.  Subjective:   Vitamin D deficiency. Katurah is taking high dose Vitamin D.    Ref. Range 06/01/2020 10:10  Vitamin D, 25-Hydroxy Latest Ref Range: 30.0 - 100.0 ng/mL 32.2   Hyperlipidemia associated with type 2 diabetes mellitus ([place]). Lipids are well controlled.   Lab Results  Component Value Date   CHOL 121 06/01/2020   HDL 56 06/01/2020   LDLCALC 35 06/01/2020   LDLDIRECT 80.0 09/19/2018   TRIG 187 (H) 06/01/2020   CHOLHDL 2 01/21/2020   Lab Results  Component Value Date   ALT 17 06/01/2020   AST 20 06/01/2020   ALKPHOS 72 06/01/2020   BILITOT <0.2 06/01/2020   The ASCVD Risk score Mikey Bussing DC Jr., et al., 2013) failed to calculate for the following reasons:   The valid systolic blood pressure range is 90 to 200 mmHg   The valid total cholesterol range is 130 to 320 mg/dL  B12 deficiency. Aliece is not on B12 supplementation at this time.  Lab Results  Component Value Date   [id number] 03/10/2016   Assessment/Plan:   Vitamin D deficiency. Low Vitamin D level contributes to fatigue and are associated with obesity, breast, and colon cancer. She was given a prescription for ergocalciferol (VITAMIN D2) 1.25 MG (50000 UT) capsule every week #4 with 0 refills and will follow-up for routine testing of Vitamin D, at  least 2-3 times per year to avoid over-replacement.   Hyperlipidemia associated with type 2 diabetes mellitus ([place]). Cardiovascular risk and specific lipid/LDL goals reviewed.  We discussed several lifestyle modifications today and Ommie will continue to work on diet, exercise and weight loss efforts. Orders and follow up as documented in patient record. Najia will continue her medication as directed.   Counseling Intensive lifestyle modifications are the first line treatment for this issue. . Dietary changes: Increase soluble fiber. Decrease simple carbohydrates. . Exercise changes: Moderate to vigorous-intensity aerobic activity 150 minutes per week if tolerated. . Lipid-lowering medications: see documented in medical record.  B12 deficiency. The diagnosis was reviewed with the patient. Counseling provided today, see below. We will continue to monitor. Orders and follow up as documented in patient record. Vitamin B12 level will be checked today.   Counseling . The body needs vitamin B12: to make red blood cells; to make DNA; and to help the nerves work properly so they can carry messages from the brain to the body.  . The main causes of vitamin B12 deficiency include dietary deficiency, digestive diseases, pernicious anemia, and having a surgery in which part of the stomach or small intestine is removed.  . Certain medicines can make it harder for the body to absorb vitamin B12. These medicines include: heartburn medications; some antibiotics; some medications used to treat diabetes, gout, and high cholesterol.  . In some  cases, there are no symptoms of this condition. If the condition leads to anemia or nerve damage, various symptoms can occur, such as weakness or fatigue, shortness of breath, and numbness or tingling in your hands and feet.   . Treatment:  o May include taking vitamin B12 supplements.  o Avoid alcohol.  o Eat lots of healthy foods that contain vitamin B12: - Beef, pork,  chicken, [place], and organ meats, such as liver.  - Seafood: This includes clams, rainbow trout, salmon, tuna, and haddock.  - Eggs.  - Cereal and dairy products that are fortified: This means that vitamin B12 has been added to the food.    Class 1 obesity with serious comorbidity and body mass index (BMI) of 33.0 to 33.9 in adult, unspecified obesity type.  Sherial is currently in the action stage of change. As such, her goal is to continue with weight loss efforts. She will change from the  [place] to keeping a food journal and adhering to recommended goals of 1500-1600 calories and 90+ grams of protein.   Exercise goals: All adults should avoid inactivity. Some physical activity is better than none, and adults who participate in any amount of physical activity gain some health benefits.  Behavioral modification strategies: increasing lean protein intake, decreasing simple carbohydrates, increasing vegetables, increasing water intake, decreasing eating out, no skipping meals, meal planning and cooking strategies and keeping healthy foods in the home.  Sevana has agreed to follow-up with our clinic in 2-3 weeks. She was informed of the importance of frequent follow-up visits to maximize her success with intensive lifestyle modifications for her multiple health conditions.   Yamilet was informed we would discuss her lab results at her next visit unless there is a critical issue that needs to be addressed sooner. Scherry agreed to keep her next visit at the agreed upon time to discuss these results.  Objective:   Blood pressure (!) 89/59, pulse 80, temperature 98.2 F (36.8 C), height 5\' 5"  (1.651 m), weight 202 lb (91.6 kg), last menstrual period 03/05/2018, SpO2 96 %. Body mass index is 33.61 kg/m.  General: Cooperative, alert, well developed, in no acute distress. HEENT: Conjunctivae and lids unremarkable. Cardiovascular: Regular rhythm.  Lungs: Normal work of breathing. Neurologic: No  focal deficits.   Lab Results  Component Value Date   CREATININE 0.63 06/01/2020   BUN 9 06/01/2020   NA 142 06/01/2020   K 4.3 06/01/2020   CL 103 06/01/2020   CO2 23 06/01/2020   Lab Results  Component Value Date   ALT 17 06/01/2020   AST 20 06/01/2020   ALKPHOS 72 06/01/2020   BILITOT <0.2 06/01/2020   Lab Results  Component Value Date   HGBA1C 8.6 (H) 06/01/2020   HGBA1C 7.3 (H) 01/21/2020   HGBA1C 6.7 (A) 02/18/2019   HGBA1C 7.8 (H) 09/19/2018   HGBA1C 8.0 (A) 05/31/2018   Lab Results  Component Value Date   INSULIN 24.3 06/01/2020   Lab Results  Component Value Date   TSH 1.450 06/01/2020   Lab Results  Component Value Date   CHOL 121 06/01/2020   HDL 56 06/01/2020   LDLCALC 35 06/01/2020   LDLDIRECT 80.0 09/19/2018   TRIG 187 (H) 06/01/2020   CHOLHDL 2 01/21/2020   Lab Results  Component Value Date   WBC 5.4 06/01/2020   HGB 11.8 06/01/2020   HCT 37.0 06/01/2020   MCV 87 06/01/2020   PLT 269 06/01/2020   Lab Results  Component Value  Date   IRON 65 05/06/2010   Obesity Behavioral Intervention:   Approximately 15 minutes were spent on the discussion below.  ASK: We discussed the diagnosis of obesity with Reanne today and Mykiah agreed to give [place] permission to discuss obesity behavioral modification therapy today.  ASSESS: Mahrosh has the diagnosis of obesity and her BMI today is 33.7. Makaylyn is in the action stage of change.   ADVISE: Macy was educated on the multiple health risks of obesity as well as the benefit of weight loss to improve her health. She was advised of the need for long term treatment and the importance of lifestyle modifications to improve her current health and to decrease her risk of future health problems.  AGREE: Multiple dietary modification options and treatment options were discussed and Mariona agreed to follow the recommendations documented in the above note.  ARRANGE: Elyshia was educated on the importance of frequent visits  to treat obesity as outlined per CMS and USPSTF guidelines and agreed to schedule her next follow up appointment today.  Attestation Statements:   Reviewed by clinician on day of visit: allergies, medications, problem list, medical history, surgical history, family history, social history, and previous encounter notes.  Migdalia Dk, am acting as Location manager for [employer], DO   I have reviewed the above documentation for accuracy and completeness, and I agree with the above. Jearld Lesch, DO

## 2020-07-22 ENCOUNTER — Encounter (INDEPENDENT_AMBULATORY_CARE_PROVIDER_SITE_OTHER): Payer: Self-pay | Admitting: Bariatrics

## 2020-07-22 ENCOUNTER — Encounter: Payer: Self-pay | Admitting: *Deleted

## 2020-07-22 LAB — VITAMIN B12: Vitamin B-12: 284 pg/mL (ref 232–1245)

## 2020-07-22 LAB — PROTIME-INR: INR: 4 — AB (ref 0.9–1.1)

## 2020-07-22 NOTE — Progress Notes (Signed)
Pt advised.

## 2020-07-22 NOTE — Progress Notes (Signed)
I sent my chart message to her -that is how we talk most of the time. I told her that she should keep on taking coumadin but to reduce to 7.5 mg every day now and recheck level in 1 week.

## 2020-07-22 NOTE — Progress Notes (Signed)
Left pt msg to c/b re: labs

## 2020-07-27 ENCOUNTER — Telehealth: Payer: Medicare Other

## 2020-07-27 ENCOUNTER — Telehealth: Payer: Self-pay | Admitting: Pharmacist

## 2020-07-27 NOTE — Telephone Encounter (Signed)
  Chronic Care Management   Note  07/27/2020 Name: Amanda Vasquez MRN: [id number] DOB: June 11, 1971   Attempted to contact patient for scheduled appointment for medication management support. She noted that she was out shopping with her mother, and she would call me back later today  Plan: - If I do not hear back from the patient by end of business today, will collaborate with Care Guide to outreach to schedule follow up with me   Catie Darnelle Maffucci, PharmD, [place], [place] Pharmacist [place] [phone]

## 2020-07-28 ENCOUNTER — Ambulatory Visit: Payer: Self-pay | Admitting: Neurology

## 2020-07-30 ENCOUNTER — Other Ambulatory Visit: Payer: Self-pay

## 2020-07-30 ENCOUNTER — Encounter: Payer: Self-pay | Admitting: Internal Medicine

## 2020-07-30 ENCOUNTER — Ambulatory Visit: Payer: Medicare Other | Admitting: Internal Medicine

## 2020-07-30 VITALS — BP 110/68 | HR 88 | Ht 65.0 in | Wt 211.0 lb

## 2020-07-30 DIAGNOSIS — Z6836 Body mass index (BMI) 36.0-36.9, adult: Secondary | ICD-10-CM | POA: Diagnosis not present

## 2020-07-30 DIAGNOSIS — E785 Hyperlipidemia, unspecified: Secondary | ICD-10-CM

## 2020-07-30 DIAGNOSIS — E1165 Type 2 diabetes mellitus with hyperglycemia: Secondary | ICD-10-CM | POA: Diagnosis not present
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
## 2020-07-30 NOTE — Patient Instructions (Signed)
Please continue: - Metformin 1000 mg 2x a day, with meals - Jardiance 25 mg daily in a.m.  Please return in 4 months with your sugar log.

## 2020-07-31 ENCOUNTER — Telehealth: Payer: Self-pay

## 2020-07-31 NOTE — Chronic Care Management (AMB) (Signed)
  Care Management   Note  07/31/2020 Name: DEDRA MATSUO MRN: [id number] DOB: 1970/12/25  LINDAANN GRADILLA is a 49 y.o. year old female who is a primary care patient of McLean-Scocuzza, Nino Glow, MD and is actively engaged with the care management team. I reached out to Danelle Earthly by phone today to assist with re-scheduling a follow up visit with the Pharmacist  Follow up plan: Unsuccessful telephone outreach attempt made. A HIPAA compliant phone message was left for the patient providing contact information and requesting a return call.  The care management team will reach out to the patient again over the next 7 days.  If patient returns call to provider office, please advise to call [facility]  at [place], [place], [place], [place] [address] Direct Dial: [phone] [email or web address] Website: [email or web address]

## 2020-08-06 ENCOUNTER — Encounter: Payer: Self-pay | Admitting: Oncology

## 2020-08-06 LAB — PROTIME-INR

## 2020-08-10 ENCOUNTER — Encounter (INDEPENDENT_AMBULATORY_CARE_PROVIDER_SITE_OTHER): Payer: Self-pay | Admitting: Family Medicine

## 2020-08-10 ENCOUNTER — Other Ambulatory Visit: Payer: Self-pay

## 2020-08-10 ENCOUNTER — Ambulatory Visit (INDEPENDENT_AMBULATORY_CARE_PROVIDER_SITE_OTHER): Payer: Medicare Other | Admitting: Family Medicine

## 2020-08-10 VITALS — BP 112/61 | HR 78 | Temp 97.6°F | Ht 65.0 in | Wt 202.0 lb

## 2020-08-10 DIAGNOSIS — E559 Vitamin D deficiency, unspecified: Secondary | ICD-10-CM | POA: Diagnosis not present

## 2020-08-10 DIAGNOSIS — G894 Chronic pain syndrome: Secondary | ICD-10-CM | POA: Diagnosis not present

## 2020-08-10 DIAGNOSIS — E1165 Type 2 diabetes mellitus with hyperglycemia: Secondary | ICD-10-CM | POA: Diagnosis not present

## 2020-08-10 DIAGNOSIS — Z79891 Long term (current) use of opiate analgesic: Secondary | ICD-10-CM | POA: Diagnosis not present

## 2020-08-10 DIAGNOSIS — M62830 Muscle spasm of back: Secondary | ICD-10-CM | POA: Diagnosis not present

## 2020-08-10 DIAGNOSIS — G890 Central pain syndrome: Secondary | ICD-10-CM | POA: Diagnosis not present

## 2020-08-10 DIAGNOSIS — E669 Obesity, unspecified: Secondary | ICD-10-CM

## 2020-08-10 MED ORDER — ERGOCALCIFEROL 1.25 MG (50000 UT) PO CAPS: 50000.0000 [IU] | ORAL_CAPSULE | ORAL | 0 refills | Status: DC

## 2020-08-10 NOTE — Chronic Care Management (AMB) (Signed)
°  Care Management   Note  08/10/2020 Name: Amanda Vasquez MRN: [id number] DOB: April 17, 1971  Amanda Vasquez is a 49 y.o. year old female who is a primary care patient of McLean-Scocuzza, Nino Glow, MD and is actively engaged with the care management team. I reached out to Danelle Earthly by phone today to assist with re-scheduling a follow up visit with the Pharmacist  Follow up plan: Unsuccessful telephone outreach attempt made. A HIPAA compliant phone message was left for the patient providing contact information and requesting a return call.  The care management team will reach out to the patient again over the next 7 days.  If patient returns call to provider office, please advise to call [facility]  at [place], [place], [place], [place] [address] Direct Dial: [phone] [name] Website: [email or web address]

## 2020-08-11 NOTE — Progress Notes (Signed)
Chief Complaint:   OBESITY Amanda Vasquez is here to discuss her progress with her obesity treatment plan along with follow-up of her obesity related diagnoses. Amanda Vasquez is on keeping a food journal and adhering to recommended goals of 1500-1600 calories and 90+ grams of protein daily and states she is following her eating plan approximately 80% of the time. 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   Today's visit was #: 5 Starting weight: 231 lbs Starting date: 06/01/2020 Today's weight: 202 lbs Today's date: 08/10/2020 Total lbs lost to date: 29 Total lbs lost since last in-office visit: 0  Interim History: Amanda Vasquez voices some frustration with lack of weight loss. She does report some change in meal plan in terms of going from vegetarian plan to journaling. Also she has been eating 3 meals per day instead of intermittent fasting. She is eating between 1500-1600 calories and getting over calories when adding snacks. She is eating between 60-70 grams of protein daily.  Subjective:   1. Vitamin D deficiency Amanda Vasquez denies nausea, vomiting, or muscle weakness, but notes fatigue. She is on prescription Vit D.  2. Type 2 diabetes mellitus with hyperglycemia, without long-term current use of insulin (Amanda Vasquez) Amanda Vasquez's last A1c was 8.6 and insulin 24.3. She is on Jardiance, metformin, Lipitor. She saw Endocrinology last week.  Assessment/Plan:   1. Vitamin D deficiency Low Vitamin D level contributes to fatigue and are associated with obesity, breast, and colon cancer. We will refill prescription Vitamin D for 1 month. Amanda Vasquez will follow-up for routine testing of Vitamin D, at least 2-3 times per year to avoid over-replacement.  - ergocalciferol (VITAMIN D2) 1.25 MG (50000 UT) capsule; Take 1 capsule (50,000 Units total) by mouth once a week.  Dispense: 4 capsule; Refill: 0  2. Type 2 diabetes mellitus with hyperglycemia, without long-term current use of insulin ([name]) Good blood sugar  control is important to decrease the likelihood of diabetic complications such as nephropathy, neuropathy, limb loss, blindness, coronary artery disease, and death. Intensive lifestyle modification including diet, exercise and weight loss are the first line of treatment for diabetes. Amanda Vasquez will follow up with Endocrinology with Dr. Cruzita Vasquez.  3. Class 1 obesity with serious comorbidity and body mass index (BMI) of 33.0 to 33.9 in adult, unspecified obesity type Amanda Vasquez is currently in the action stage of change. As such, her goal is to continue with weight loss efforts. She has agreed to keeping a food journal and adhering to recommended goals of 1500-1600 calories and 90+ grams of protein daily.   Exercise goals: As is.  Behavioral modification strategies: increasing lean protein intake, meal planning and cooking strategies, keeping healthy foods in the home and planning for success.  Amanda Vasquez has agreed to follow-up with our clinic in 3 weeks. She was informed of the importance of frequent follow-up visits to maximize her success with intensive lifestyle modifications for her multiple health conditions.   Objective:   Blood pressure 112/61, pulse 78, temperature 97.6 F (36.4 C), temperature source Oral, height 5\' 5"  (1.651 m), weight 202 lb (91.6 kg), last menstrual period 03/05/2018, SpO2 95 %. Body mass index is 33.61 kg/m.  General: Cooperative, alert, well developed, in no acute distress. HEENT: Conjunctivae and lids unremarkable. Cardiovascular: Regular rhythm.  Lungs: Normal work of breathing. Neurologic: No focal deficits.   Lab Results  Component Value Date   CREATININE 0.63 06/01/2020   BUN 9 06/01/2020   NA 142 06/01/2020   K 4.3  06/01/2020   CL 103 06/01/2020   CO2 23 06/01/2020   Lab Results  Component Value Date   ALT 17 06/01/2020   AST 20 06/01/2020   ALKPHOS 72 06/01/2020   BILITOT <0.2 06/01/2020   Lab Results  Component Value Date   HGBA1C 8.6 (H) 06/01/2020    HGBA1C 7.3 (H) 01/21/2020   HGBA1C 6.7 (A) 02/18/2019   HGBA1C 7.8 (H) 09/19/2018   HGBA1C 8.0 (A) 05/31/2018   Lab Results  Component Value Date   INSULIN 24.3 06/01/2020   Lab Results  Component Value Date   TSH 1.450 06/01/2020   Lab Results  Component Value Date   CHOL 121 06/01/2020   HDL 56 06/01/2020   LDLCALC 35 06/01/2020   LDLDIRECT 80.0 09/19/2018   TRIG 187 (H) 06/01/2020   CHOLHDL 2 01/21/2020   Lab Results  Component Value Date   WBC 5.4 06/01/2020   HGB 11.8 06/01/2020   HCT 37.0 06/01/2020   MCV 87 06/01/2020   PLT 269 06/01/2020   Lab Results  Component Value Date   IRON 65 05/06/2010    Obesity Behavioral Intervention:   Approximately 15 minutes were spent on the discussion below.  ASK: We discussed the diagnosis of obesity with Amanda Vasquez today and Amanda Vasquez agreed to give [place] permission to discuss obesity behavioral modification therapy today.  ASSESS: Amanda Vasquez has the diagnosis of obesity and her BMI today is 33.61. Amanda Vasquez is in the action stage of change.   ADVISE: Amanda Vasquez was educated on the multiple health risks of obesity as well as the benefit of weight loss to improve her health. She was advised of the need for long term treatment and the importance of lifestyle modifications to improve her current health and to decrease her risk of future health problems.  AGREE: Multiple dietary modification options and treatment options were discussed and Amanda Vasquez agreed to follow the recommendations documented in the above note.  ARRANGE: Amanda Vasquez was educated on the importance of frequent visits to treat obesity as outlined per CMS and USPSTF guidelines and agreed to schedule her next follow up appointment today.  Attestation Statements:   Reviewed by clinician on day of visit: allergies, medications, problem list, medical history, surgical history, family history, social history, and previous encounter notes.   I, Trixie Dredge, am acting as transcriptionist for  Coralie Common, MD.  I have reviewed the above documentation for accuracy and completeness, and I agree with the above. - Jinny Blossom, MD

## 2020-08-13 ENCOUNTER — Other Ambulatory Visit: Payer: Self-pay | Admitting: Internal Medicine

## 2020-08-13 DIAGNOSIS — D6861 Antiphospholipid syndrome: Secondary | ICD-10-CM | POA: Diagnosis not present

## 2020-08-13 DIAGNOSIS — Z7901 Long term (current) use of anticoagulants: Secondary | ICD-10-CM | POA: Diagnosis not present

## 2020-08-13 LAB — PROTIME-INR: INR: 3.4 — AB (ref 0.9–1.1)

## 2020-08-16 ENCOUNTER — Encounter: Payer: Self-pay | Admitting: Oncology

## 2020-08-17 ENCOUNTER — Ambulatory Visit: Payer: Medicare Other | Admitting: Gastroenterology

## 2020-08-17 NOTE — Telephone Encounter (Signed)
Patient has been rescheduled for f/u

## 2020-08-17 NOTE — Chronic Care Management (AMB) (Signed)
  Care Management   Note  08/17/2020 Name: RUQAYYA VENTRESS MRN: [id number] DOB: [birth year]  Amanda Vasquez is a 49 y.o. year old female who is a primary care patient of McLean-Scocuzza, Pasty Spillers, MD and is actively engaged with the care management team. I reached out to Alean Rinne by phone today to assist with re-scheduling a follow up visit with the Pharmacist  Follow up plan: Telephone appointment with care management team member scheduled for:08/20/2020  Penne Lash, RMA Care Guide, Embedded Care Coordination [facility]  [place], [place] [address] Direct Dial: [phone] [email or web address] Website: [email or web address]

## 2020-08-19 ENCOUNTER — Encounter: Payer: Self-pay | Admitting: *Deleted

## 2020-08-20 ENCOUNTER — Encounter: Payer: Self-pay | Admitting: *Deleted

## 2020-08-20 ENCOUNTER — Telehealth: Payer: Self-pay | Admitting: Pharmacist

## 2020-08-20 ENCOUNTER — Telehealth: Payer: Medicare Other

## 2020-08-20 ENCOUNTER — Encounter: Payer: Self-pay | Admitting: Oncology

## 2020-08-20 LAB — PROTIME-INR: INR: 2.1 — AB (ref 0.9–1.1)

## 2020-08-20 NOTE — Telephone Encounter (Signed)
°  Chronic Care Management   Note  08/20/2020 Name: Amanda Vasquez MRN: [id number] DOB: [birth year]   Attempted to contact patient for scheduled appointment for medication management support. Left HIPAA compliant message for patient to return my call at their convenience.    Plan: - If I do not hear back from the patient by end of business today, will collaborate with Care Guide to outreach to schedule follow up with me   Catie Feliz Beam, PharmD, [place], CPP Clinical Pharmacist [facility] at [employer] [phone]

## 2020-08-25 ENCOUNTER — Telehealth: Payer: Self-pay | Admitting: Neurology

## 2020-08-25 NOTE — Telephone Encounter (Signed)
Patient had a Botox appointment on 12/7 that she had to cancel due to personal reasons. Patient called back yesterday and LVM regarding rescheduling. I returned patient's call and LVM to call back.

## 2020-08-26 LAB — PROTIME-INR: INR: 1.8 — AB (ref 0.9–1.1)

## 2020-08-27 ENCOUNTER — Encounter: Payer: Self-pay | Admitting: Oncology

## 2020-08-27 ENCOUNTER — Telehealth: Payer: Self-pay

## 2020-08-27 ENCOUNTER — Other Ambulatory Visit: Payer: Self-pay | Admitting: *Deleted

## 2020-08-27 ENCOUNTER — Encounter: Payer: Self-pay | Admitting: *Deleted

## 2020-08-27 MED ORDER — WARFARIN SODIUM 10 MG PO TABS: 10.0000 mg | ORAL_TABLET | ORAL | 2 refills | Status: DC

## 2020-08-27 NOTE — Chronic Care Management (AMB) (Signed)
  Care Management   Note  08/27/2020 Name: ADITHI GAMMON MRN: [id number] DOB: [birth year]  JULIANNE CHAMBERLIN is a 50 y.o. year old female who is a primary care patient of McLean-Scocuzza, Pasty Spillers, MD and is actively engaged with the care management team. I reached out to Alean Rinne by phone today to assist with re-scheduling a follow up visit with the Pharmacist  Follow up plan: Unsuccessful telephone outreach attempt made. A HIPAA compliant phone message was left for the patient providing contact information and requesting a return call.  The care management team will reach out to the patient again over the next 7 days.  If patient returns call to provider office, please advise to call Embedded Care Management Care Guide Penne Lash  at [phone]  Penne Lash, RMA Care Guide, Embedded Care Coordination [facility]  [place], [place] [address] Direct Dial: [phone] [email or web address] Website: [email or web address]

## 2020-09-01 ENCOUNTER — Ambulatory Visit (INDEPENDENT_AMBULATORY_CARE_PROVIDER_SITE_OTHER): Payer: Medicare Other | Admitting: Family Medicine

## 2020-09-01 ENCOUNTER — Other Ambulatory Visit: Payer: Self-pay

## 2020-09-01 ENCOUNTER — Encounter: Payer: Self-pay | Admitting: Oncology

## 2020-09-01 ENCOUNTER — Encounter (INDEPENDENT_AMBULATORY_CARE_PROVIDER_SITE_OTHER): Payer: Self-pay | Admitting: Family Medicine

## 2020-09-01 VITALS — BP 116/71 | HR 88 | Temp 98.3°F | Ht 65.0 in | Wt 196.0 lb

## 2020-09-01 DIAGNOSIS — E559 Vitamin D deficiency, unspecified: Secondary | ICD-10-CM | POA: Diagnosis not present

## 2020-09-01 DIAGNOSIS — Z6832 Body mass index (BMI) 32.0-32.9, adult: Secondary | ICD-10-CM

## 2020-09-01 DIAGNOSIS — E669 Obesity, unspecified: Secondary | ICD-10-CM

## 2020-09-01 DIAGNOSIS — E1165 Type 2 diabetes mellitus with hyperglycemia: Secondary | ICD-10-CM | POA: Diagnosis not present

## 2020-09-01 LAB — PROTIME-INR: INR: 2.9 — AB (ref 0.9–1.1)

## 2020-09-01 MED ORDER — ERGOCALCIFEROL 1.25 MG (50000 UT) PO CAPS
50000.0000 [IU] | ORAL_CAPSULE | ORAL | 0 refills | Status: DC
Start: 2020-09-01 — End: 2020-10-06

## 2020-09-01 NOTE — Telephone Encounter (Signed)
Patient has been rescheduled.

## 2020-09-01 NOTE — Chronic Care Management (AMB) (Signed)
  Care Management   Note  09/01/2020 Name: Amanda Vasquez MRN: [id number] DOB: 13-Oct-1970  [name] is a 50 y.o. year old female who is a primary care patient of McLean-Scocuzza, Nino Glow, MD and is actively engaged with the care management team. I reached out to Danelle Earthly by phone today to assist with re-scheduling a follow up visit with the Pharmacist  Follow up plan: Unsuccessful telephone outreach attempt made. A HIPAA compliant phone message was left for the patient providing contact information and requesting a return call.  The care management team will reach out to the patient again over the next 7 days.  If patient returns call to provider office, please advise to call [facility]  at [place], [place], [place], [place] [address] Direct Dial: [phone] [email or web address] Website: [email or web address]

## 2020-09-01 NOTE — Chronic Care Management (AMB) (Signed)
  Care Management   Note  09/01/2020 Name: [name] MRN: [id number] DOB: 1971-02-22  Amanda Vasquez is a 50 y.o. year old female who is a primary care patient of McLean-Scocuzza, Nino Glow, MD and is actively engaged with the care management team. I reached out to Danelle Earthly by phone today to assist with re-scheduling a follow up visit with the Pharmacist  Follow up plan: Telephone appointment with care management team member scheduled for:09/22/2020  Noreene Larsson, [place], [place] [facility]  [place], [place] [address] Direct Dial: [phone] [email or web address]

## 2020-09-03 ENCOUNTER — Ambulatory Visit: Payer: Medicare Other | Admitting: Gastroenterology

## 2020-09-07 NOTE — Progress Notes (Signed)
Chief Complaint:   OBESITY Amanda Vasquez is here to discuss her progress with her obesity treatment plan along with follow-up of her obesity related diagnoses. Amanda Vasquez is on the [place] and states she is following her eating plan approximately 85% of the time. Amanda Vasquez states she is walking 30-45 minutes 7 times per week.  Today's visit was #: 6 Starting weight: 231 lbs Starting date: 06/01/2020 Today's weight: 196 lbs Today's date: 09/01/2020 Total lbs lost to date: 35 lbs Total lbs lost since last in-office visit: 6 lbs  Interim History: Amanda Vasquez voices she had a very enjoyable holiday season. She did have very few indulgent meals over last few weeks.Amanda Vasquez went back to vegetarian plan with 400 snack calories. Doing cottage cheese, fruit, and sargento fast breaks.Amanda Vasquez is still having nausea with some vomiting. She is seeing GI on Jan 27.  Subjective:   1. Type 2 diabetes mellitus with hyperglycemia, without long-term current use of insulin ([name]) No hypoglycemia episodes. Amanda Vasquez is on metformin and Jardiance.  2. Vitamin D deficiency Amanda Vasquez denies nausea, vomiting, and muscle weakness, but notes fatigue. She is on prescription Vitamin D.    Assessment/Plan:   1. Type 2 diabetes mellitus with hyperglycemia, without long-term current use of insulin ([name]) Amanda Vasquez will continue metformin. No refill needed.  2. Vitamin D deficiency Refill Vitamin D 50,000 unit/weeklty #4. No refill.  - ergocalciferol (VITAMIN D2) 1.25 MG (50000 UT) capsule; Take 1 capsule (50,000 Units total) by mouth once a week.  Dispense: 4 capsule; Refill: 0  3. Class 1 obesity with serious comorbidity and body mass index (BMI) of 32.0 to 32.9 in adult, unspecified obesity type  Amanda Vasquez is currently in the action stage of change. As such, her goal is to continue with weight loss efforts. She has agreed to the [place].   Exercise goals: Some exercise.  Behavioral modification strategies: increasing lean protein intake,  meal planning and cooking strategies, keeping healthy foods in the home and planning for success.  Amanda Vasquez has agreed to follow-up with our clinic in 2 weeks. She was informed of the importance of frequent follow-up visits to maximize her success with intensive lifestyle modifications for her multiple health conditions.    Objective:   Blood pressure 116/71, pulse 88, temperature 98.3 F (36.8 C), temperature source Oral, height 5\' 5"  (1.651 m), weight 196 lb (88.9 kg), last menstrual period 03/05/2018, SpO2 94 %. Body mass index is 32.62 kg/m.  General: Cooperative, alert, well developed, in no acute distress. HEENT: Conjunctivae and lids unremarkable. Cardiovascular: Regular rhythm.  Lungs: Normal work of breathing. Neurologic: No focal deficits.   Lab Results  Component Value Date   CREATININE 0.63 06/01/2020   BUN 9 06/01/2020   NA 142 06/01/2020   K 4.3 06/01/2020   CL 103 06/01/2020   CO2 23 06/01/2020   Lab Results  Component Value Date   ALT 17 06/01/2020   AST 20 06/01/2020   ALKPHOS 72 06/01/2020   BILITOT <0.2 06/01/2020   Lab Results  Component Value Date   HGBA1C 8.6 (H) 06/01/2020   HGBA1C 7.3 (H) 01/21/2020   HGBA1C 6.7 (A) 02/18/2019   HGBA1C 7.8 (H) 09/19/2018   HGBA1C 8.0 (A) 05/31/2018   Lab Results  Component Value Date   INSULIN 24.3 06/01/2020   Lab Results  Component Value Date   TSH 1.450 06/01/2020   Lab Results  Component Value Date   CHOL 121 06/01/2020   HDL 56 06/01/2020   LDLCALC 35  06/01/2020   LDLDIRECT 80.0 09/19/2018   TRIG 187 (H) 06/01/2020   CHOLHDL 2 01/21/2020   Lab Results  Component Value Date   WBC 5.4 06/01/2020   HGB 11.8 06/01/2020   HCT 37.0 06/01/2020   MCV 87 06/01/2020   PLT 269 06/01/2020   Lab Results  Component Value Date   IRON 65 05/06/2010    Obesity Behavioral Intervention:   Approximately 15 minutes were spent on the discussion below.  ASK: We discussed the diagnosis of obesity with Mallie  today and Galaxy agreed to give [place] permission to discuss obesity behavioral modification therapy today.  ASSESS: Loucile has the diagnosis of obesity and her BMI today is 32.7. Dontavia is in the action stage of change.   ADVISE: Johnnie was educated on the multiple health risks of obesity as well as the benefit of weight loss to improve her health. She was advised of the need for long term treatment and the importance of lifestyle modifications to improve her current health and to decrease her risk of future health problems.  AGREE: Multiple dietary modification options and treatment options were discussed and Shauntel agreed to follow the recommendations documented in the above note.  ARRANGE: Ethelene was educated on the importance of frequent visits to treat obesity as outlined per CMS and USPSTF guidelines and agreed to schedule her next follow up appointment today.  Attestation Statements:   Reviewed by clinician on day of visit: allergies, medications, problem list, medical history, surgical history, family history, social history, and previous encounter notes.  I, Para March, am acting as transcriptionist for Coralie Common, MD. I have reviewed the above documentation for accuracy and completeness, and I agree with the above. - Jinny Blossom, MD

## 2020-09-09 ENCOUNTER — Ambulatory Visit: Payer: Medicare Other | Admitting: Neurology

## 2020-09-09 DIAGNOSIS — G43711 Chronic migraine without aura, intractable, with status migrainosus: Secondary | ICD-10-CM

## 2020-09-09 DIAGNOSIS — Z7901 Long term (current) use of anticoagulants: Secondary | ICD-10-CM | POA: Diagnosis not present

## 2020-09-09 DIAGNOSIS — D6861 Antiphospholipid syndrome: Secondary | ICD-10-CM | POA: Diagnosis not present

## 2020-09-09 NOTE — Progress Notes (Signed)
Consent Form Botulism Toxin Injection For Chronic Migraine  Interval history 09/09/2020: Continues excellent response, > 75% decrease frequency headaches and migraines. +a. Her 51 year old dog had pancreatitis, he is on insulin now,she has a new puppy about 75 months old, and still no worsening of migraines.   Interval history 04/21/2020: Continues to do extremely well > 75% decrease freq headaches. +a  Reviewed orally with patient, additionally signature is on file:  Botulism toxin has been approved by the Federal drug administration for treatment of chronic migraine. Botulism toxin does not cure chronic migraine and it may not be effective in some patients.  The administration of botulism toxin is accomplished by injecting a small amount of toxin into the muscles of the neck and head. Dosage must be titrated for each individual. Any benefits resulting from botulism toxin tend to wear off after 3 months with a repeat injection required if benefit is to be maintained. Injections are usually done every 3-4 months with maximum effect peak achieved by about 2 or 3 weeks. Botulism toxin is expensive and you should be sure of what costs you will incur resulting from the injection.  The side effects of botulism toxin use for chronic migraine may include:   -Transient, and usually mild, facial weakness with facial injections  -Transient, and usually mild, head or neck weakness with head/neck injections  -Reduction or loss of forehead facial animation due to forehead muscle weakness  -Eyelid drooping  -Dry eye  -Pain at the site of injection or bruising at the site of injection  -Double vision  -Potential unknown long term risks  Contraindications: You should not have Botox if you are pregnant, nursing, allergic to albumin, have an infection, skin condition, or muscle weakness at the site of the injection, or have myasthenia gravis, Lambert-Eaton syndrome, or ALS.  It is also possible that as with  any injection, there may be an allergic reaction or no effect from the medication. Reduced effectiveness after repeated injections is sometimes seen and rarely infection at the injection site may occur. All care will be taken to prevent these side effects. If therapy is given over a long time, atrophy and wasting in the muscle injected may occur. Occasionally the patient's become refractory to treatment because they develop antibodies to the toxin. In this event, therapy needs to be modified.  I have read the above information and consent to the administration of botulism toxin.    BOTOX PROCEDURE NOTE FOR MIGRAINE HEADACHE    Contraindications and precautions discussed with patient(above). Aseptic procedure was observed and patient tolerated procedure. Procedure performed by Dr. Georgia Dom  The condition has existed for more than 6 months, and pt does not have a diagnosis of ALS, Myasthenia Gravis or Lambert-Eaton Syndrome.  Risks and benefits of injections discussed and pt agrees to proceed with the procedure.  Written consent obtained  These injections are medically necessary. Pt  receives good benefits from these injections. These injections do not cause sedations or hallucinations which the oral therapies may cause.  Description of procedure:  The patient was placed in a sitting position. The standard protocol was used for Botox as follows, with 5 units of Botox injected at each site:   -Procerus muscle, midline injection  -Corrugator muscle, bilateral injection  -Frontalis muscle, bilateral injection, with 2 sites each side, medial injection was performed in the upper one third of the frontalis muscle, in the region vertical from the medial inferior edge of the superior orbital rim. The  lateral injection was again in the upper one third of the forehead vertically above the lateral limbus of the cornea, 1.5 cm lateral to the medial injection site.    -Temporalis muscle injection, 5  sites, bilaterally. The first injection was 3 cm above the tragus of the ear, second injection site was 1.5 cm to 3 cm up from the first injection site in line with the tragus of the ear. The third injection site was 1.5-3 cm forward between the first 2 injection sites. The fourth injection site was 1.5 cm posterior to the second injection site.   -Occipitalis muscle injection, 3 sites, bilaterally. The first injection was done one half way between the occipital protuberance and the tip of the mastoid process behind the ear. The second injection site was done lateral and superior to the first, 1 fingerbreadth from the first injection. The third injection site was 1 fingerbreadth superiorly and medially from the first injection site.  -Cervical paraspinal muscle injection, 2 sites, bilateral knee first injection site was 1 cm from the midline of the cervical spine, 3 cm inferior to the lower border of the occipital protuberance. The second injection site was 1.5 cm superiorly and laterally to the first injection site.  -Trapezius muscle injection was performed at 3 sites, bilaterally. The first injection site was in the upper trapezius muscle halfway between the inflection point of the neck, and the acromion. The second injection site was one half way between the acromion and the first injection site. The third injection was done between the first injection site and the inflection point of the neck.   Will return for repeat injection in 3 months.   A 200 unit sof Botox was used, any Botox not injected was wasted. The patient tolerated the procedure well, there were no complications of the above procedure.  Performed by Dr. Jaynee Eagles M.D. 70ml Lidocaine 1%, 30-gauge needle was used. All procedures a documented were medically necessary, reasonable and appropriate based on the patient's history, medical diagnosis and physician opinion. Verbal informed consent was obtained from the patient, patient was informed  of potential risk of procedure, including bruising, bleeding, hematoma formation, infection, muscle weakness, muscle pain, numbness, transient hypertension, transient hyperglycemia and transient insomnia among others. All areas injected were topically clean with isopropyl rubbing alcohol. Nonsterile nonlatex gloves were worn during the procedure.  Left pectoralis 3 injection sites based on palpation and pain. Used emg machine to target the muscle. Patient tolerated well no side effects. Felt better after procedure. Called her this evening, no side effects.

## 2020-09-09 NOTE — Progress Notes (Signed)
Botox- 200 units x 1 vial Lot: [id number] Expiration: 04/2023 NDC: 0023-3921-02  Bacteriostatic 0.9% Sodium Chloride- 4mL total Lot: [id number] Expiration: 09/22/2021 NDC: 0409-1966-02  Dx: G43.711 B/B  

## 2020-09-10 ENCOUNTER — Encounter: Payer: Self-pay | Admitting: *Deleted

## 2020-09-10 ENCOUNTER — Encounter: Payer: Self-pay | Admitting: Oncology

## 2020-09-16 ENCOUNTER — Encounter: Payer: Self-pay | Admitting: Oncology

## 2020-09-16 ENCOUNTER — Encounter: Payer: Self-pay | Admitting: *Deleted

## 2020-09-17 ENCOUNTER — Other Ambulatory Visit: Payer: Self-pay

## 2020-09-17 ENCOUNTER — Encounter (INDEPENDENT_AMBULATORY_CARE_PROVIDER_SITE_OTHER): Payer: Self-pay | Admitting: Family Medicine

## 2020-09-17 ENCOUNTER — Telehealth (INDEPENDENT_AMBULATORY_CARE_PROVIDER_SITE_OTHER): Payer: Medicare Other | Admitting: Family Medicine

## 2020-09-17 ENCOUNTER — Ambulatory Visit: Payer: Medicare Other | Admitting: Gastroenterology

## 2020-09-17 DIAGNOSIS — E1165 Type 2 diabetes mellitus with hyperglycemia: Secondary | ICD-10-CM

## 2020-09-17 DIAGNOSIS — E1169 Type 2 diabetes mellitus with other specified complication: Secondary | ICD-10-CM

## 2020-09-17 DIAGNOSIS — E669 Obesity, unspecified: Secondary | ICD-10-CM

## 2020-09-17 DIAGNOSIS — Z6832 Body mass index (BMI) 32.0-32.9, adult: Secondary | ICD-10-CM

## 2020-09-17 DIAGNOSIS — E785 Hyperlipidemia, unspecified: Secondary | ICD-10-CM

## 2020-09-21 ENCOUNTER — Ambulatory Visit (INDEPENDENT_AMBULATORY_CARE_PROVIDER_SITE_OTHER): Payer: Medicare Other

## 2020-09-21 VITALS — Ht 65.0 in | Wt 196.0 lb

## 2020-09-21 DIAGNOSIS — Z0000 Encounter for general adult medical examination without abnormal findings: Secondary | ICD-10-CM

## 2020-09-21 NOTE — Patient Instructions (Addendum)
[name] , Thank you for taking time to come for your Medicare Wellness Visit. I appreciate your ongoing commitment to your health goals. Please review the following plan we discussed and let me know if I can assist you in the future.   These are the goals we discussed:  Goal: I would love to lose 35lb   This is a list of the screening recommended for you and due dates:  Health Maintenance  Topic Date Due  . Colon Cancer Screening  06/06/2018  . Eye exam for diabetics  06/05/2020  . Pap Smear  10/04/2020  . Hemoglobin A1C  11/30/2020  . Complete foot exam   01/20/2021  . Urine Protein Check  01/22/2021  . Mammogram  04/07/2021  . Tetanus Vaccine  10/25/2028  . Flu Shot  Completed  . Pneumococcal vaccine  Completed  . COVID-19 Vaccine  Completed  .  Hepatitis C: One time screening is recommended by Center for Disease Control  (CDC) for  adults born from [birth year] through 1966.   Completed  . HIV Screening  Completed    Immunizations Immunization History  Administered Date(s) Administered  . Influenza Split 06/22/2011  . Influenza,inj,Quad PF,6+ Mos 04/26/2013, 05/22/2014, 05/17/2017, 05/31/2018, 05/13/2019  . Influenza-Unspecified 07/07/2015, 05/06/2020, 05/07/2020  . Moderna Sars-Covid-2 Vaccination 11/01/2019, 12/03/2019, 04/20/2020  . Pneumococcal Polysaccharide-23 09/12/2012, 10/03/2017  . Td 06/07/2006  . Tdap 10/26/2018, 10/26/2018   Keep all routine maintenance appointments.   CCM 09/22/20 @ 1:30  Follow up 12/15/20 @ 1:30  Advanced directives: not yet completed  Conditions/risks identified: none new  Follow up in one year for your annual wellness visit.   Preventive Care 40-64 Years, Female Preventive care refers to lifestyle choices and visits with your health care provider that can promote health and wellness. What does preventive care include?  A yearly physical exam. This is also called an annual well check.  Dental exams once or twice a year.  Routine  eye exams. Ask your health care provider how often you should have your eyes checked.  Personal lifestyle choices, including:  Daily care of your teeth and gums.  Regular physical activity.  Eating a healthy diet.  Avoiding tobacco and drug use.  Limiting alcohol use.  Practicing safe sex.  Taking low-dose aspirin daily starting at age 36.  Taking vitamin and mineral supplements as recommended by your health care provider. What happens during an annual well check? The services and screenings done by your health care provider during your annual well check will depend on your age, overall health, lifestyle risk factors, and family history of disease. Counseling  Your health care provider may ask you questions about your:  Alcohol use.  Tobacco use.  Drug use.  Emotional well-being.  Home and relationship well-being.  Sexual activity.  Eating habits.  Work and work Statistician.  Method of birth control.  Menstrual cycle.  Pregnancy history. Screening  You may have the following tests or measurements:  Height, weight, and BMI.  Blood pressure.  Lipid and cholesterol levels. These may be checked every 5 years, or more frequently if you are over 59 years old.  Skin check.  Lung cancer screening. You may have this screening every year starting at age 88 if you have a 30-pack-year history of smoking and currently smoke or have quit within the past 15 years.  Fecal occult blood test (FOBT) of the stool. You may have this test every year starting at age 45.  Flexible sigmoidoscopy or colonoscopy. You  may have a sigmoidoscopy every 5 years or a colonoscopy every 10 years starting at age 27.  Hepatitis C blood test.  Hepatitis B blood test.  Sexually transmitted disease (STD) testing.  Diabetes screening. This is done by checking your blood sugar (glucose) after you have not eaten for a while (fasting). You may have this done every 1-3 years.  Mammogram. This  may be done every 1-2 years. Talk to your health care provider about when you should start having regular mammograms. This may depend on whether you have a family history of breast cancer.  BRCA-related cancer screening. This may be done if you have a family history of breast, ovarian, tubal, or peritoneal cancers.  Pelvic exam and Pap test. This may be done every 3 years starting at age 62. Starting at age 90, this may be done every 5 years if you have a Pap test in combination with an HPV test.  Bone density scan. This is done to screen for osteoporosis. You may have this scan if you are at high risk for osteoporosis. Discuss your test results, treatment options, and if necessary, the need for more tests with your health care provider. Vaccines  Your health care provider may recommend certain vaccines, such as:  Influenza vaccine. This is recommended every year.  Tetanus, diphtheria, and acellular pertussis (Tdap, Td) vaccine. You may need a Td booster every 10 years.  Zoster vaccine. You may need this after age 5.  Pneumococcal 13-valent conjugate (PCV13) vaccine. You may need this if you have certain conditions and were not previously vaccinated.  Pneumococcal polysaccharide (PPSV23) vaccine. You may need one or two doses if you smoke cigarettes or if you have certain conditions. Talk to your health care provider about which screenings and vaccines you need and how often you need them. This information is not intended to replace advice given to you by your health care provider. Make sure you discuss any questions you have with your health care provider. Document Released: 09/04/2015 Document Revised: 04/27/2016 Document Reviewed: 06/09/2015 Elsevier Interactive Patient Education  2017 [place] [employer] Prevention in the Home Falls can cause injuries. They can happen to people of all ages. There are many things you can do to make your home safe and to help prevent falls. What  can I do on the outside of my home?  Regularly fix the edges of walkways and driveways and fix any cracks.  Remove anything that might make you trip as you walk through a door, such as a raised step or threshold.  Trim any bushes or trees on the path to your home.  Use bright outdoor lighting.  Clear any walking paths of anything that might make someone trip, such as rocks or tools.  Regularly check to see if handrails are loose or broken. Make sure that both sides of any steps have handrails.  Any raised decks and porches should have guardrails on the edges.  Have any leaves, snow, or ice cleared regularly.  Use sand or salt on walking paths during winter.  Clean up any spills in your garage right away. This includes oil or grease spills. What can I do in the bathroom?  Use night lights.  Install grab bars by the toilet and in the tub and shower. Do not use towel bars as grab bars.  Use non-skid mats or decals in the tub or shower.  If you need to sit down in the shower, use a plastic, non-slip stool.  Keep the floor dry. Clean up any water that spills on the floor as soon as it happens.  Remove soap buildup in the tub or shower regularly.  Attach bath mats securely with double-sided non-slip rug tape.  Do not have throw rugs and other things on the floor that can make you trip. What can I do in the bedroom?  Use night lights.  Make sure that you have a light by your bed that is easy to reach.  Do not use any sheets or blankets that are too big for your bed. They should not hang down onto the floor.  Have a firm chair that has side arms. You can use this for support while you get dressed.  Do not have throw rugs and other things on the floor that can make you trip. What can I do in the kitchen?  Clean up any spills right away.  Avoid walking on wet floors.  Keep items that you use a lot in easy-to-reach places.  If you need to reach something above you, use a  strong step stool that has a grab bar.  Keep electrical cords out of the way.  Do not use floor polish or wax that makes floors slippery. If you must use wax, use non-skid floor wax.  Do not have throw rugs and other things on the floor that can make you trip. What can I do with my stairs?  Do not leave any items on the stairs.  Make sure that there are handrails on both sides of the stairs and use them. Fix handrails that are broken or loose. Make sure that handrails are as long as the stairways.  Check any carpeting to make sure that it is firmly attached to the stairs. Fix any carpet that is loose or worn.  Avoid having throw rugs at the top or bottom of the stairs. If you do have throw rugs, attach them to the floor with carpet tape.  Make sure that you have a light switch at the top of the stairs and the bottom of the stairs. If you do not have them, ask someone to add them for you. What else can I do to help prevent falls?  Wear shoes that:  Do not have high heels.  Have rubber bottoms.  Are comfortable and fit you well.  Are closed at the toe. Do not wear sandals.  If you use a stepladder:  Make sure that it is fully opened. Do not climb a closed stepladder.  Make sure that both sides of the stepladder are locked into place.  Ask someone to hold it for you, if possible.  Clearly mark and make sure that you can see:  Any grab bars or handrails.  First and last steps.  Where the edge of each step is.  Use tools that help you move around (mobility aids) if they are needed. These include:  Canes.  Walkers.  Scooters.  Crutches.  Turn on the lights when you go into a dark area. Replace any light bulbs as soon as they burn out.  Set up your furniture so you have a clear path. Avoid moving your furniture around.  If any of your floors are uneven, fix them.  If there are any pets around you, be aware of where they are.  Review your medicines with your  doctor. Some medicines can make you feel dizzy. This can increase your chance of falling. Ask your doctor what other things that you can  do to help prevent falls. This information is not intended to replace advice given to you by your health care provider. Make sure you discuss any questions you have with your health care provider. Document Released: 06/04/2009 Document Revised: 01/14/2016 Document Reviewed: 09/12/2014 Elsevier Interactive Patient Education  2017 [employer].
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
## 2020-09-21 NOTE — Progress Notes (Signed)
TeleHealth Visit:  Due to the COVID-19 pandemic, this visit was completed with telemedicine (audio/video) technology to reduce patient and provider exposure as well as to preserve personal protective equipment.   Amanda Vasquez has verbally consented to this TeleHealth visit. The patient is located at home, the provider is located at the [facility] office. The participants in this visit include the listed provider and patient. The visit was conducted today via MyChart Video.   Chief Complaint: OBESITY Amanda Vasquez is here to discuss her progress with her obesity treatment plan along with follow-up of her obesity related diagnoses. Amanda Vasquez is on keeping a food journal and adhering to recommended goals of 1200-1600 calories and 85 grams of protein and the Amanda Vasquez and states she is following her eating plan approximately 80% of the time. Amanda Vasquez states she is not exercising.  Today's visit was #: 7 Starting weight: 231 lbs Starting date: 06/01/2020  Interim History: Calorie wise Amanda Vasquez is staying between 1200-1600, and one day she didn't eat anything due to pain (this resulted in a low blood sugar). Getting around 85 gram of protein a day. Denies hunger but very intermittent sparse sweet cravings. Amanda Vasquez is using chocolate flavored protein to satisfy sweet craving. Amanda Vasquez would like to stay on journaling plan.  Subjective:   Hyperlipidemia associated with type 2 diabetes mellitus (Amanda Vasquez)  Amanda Vasquez is on statin (Lipitor 40 mg daily). No transaminitis on most recent lab. LDL of 35 on 06/01/2020.  Type 2 diabetes mellitus with hyperglycemia, without long-term current use of insulin ([name]) Highest blood sugar of 170, and lowest mid 70s. Blood sugars mostly between 90-120. Amanda Vasquez is on Jardiance and metformin.   Assessment/Plan:   1. Hyperlipidemia associated with type 2 diabetes mellitus ([place]) Cardiovascular risk and specific lipid/LDL goals reviewed.  We discussed several lifestyle modifications today  and Amanda Vasquez will continue to work on diet, exercise and weight loss efforts. Orders and follow up as documented in patient record. Follow up [place] and CMP in [place].  Counseling Intensive lifestyle modifications are the first line treatment for this issue. . Dietary changes: Increase soluble fiber. Decrease simple carbohydrates. . Exercise changes: Moderate to vigorous-intensity aerobic activity 150 minutes per week if tolerated. . Lipid-lowering medications: see documented in medical record.  2. Type 2 diabetes mellitus with hyperglycemia, without long-term current use of insulin ([name]) Good blood sugar control is important to decrease the likelihood of diabetic complications such as nephropathy, neuropathy, limb loss, blindness, coronary artery disease, and death. Intensive lifestyle modification including diet, exercise and weight loss are the first line of treatment for diabetes. Amanda Vasquez will continue current medications with no change in dose.  3. Class 1 obesity with serious comorbidity and body mass index (BMI) of 32.0 to 32.9 in adult, unspecified obesity type  Amanda Vasquez is currently in the action stage of change. As such, her goal is to continue with weight loss efforts. She has agreed to keeping a food journal and adhering to recommended goals of 1200-1600 calories and 85+ gram of protein daily.   Exercise goals: No exercise has been prescribed at this time.  Behavioral modification strategies: increasing lean protein intake, no skipping meals and meal planning and cooking strategies.  Amanda Vasquez has agreed to follow-up with our clinic in 2 weeks. She was informed of the importance of frequent follow-up visits to maximize her success with intensive lifestyle modifications for her multiple health conditions.  Objective:   VITALS: Per patient if applicable, see vitals. GENERAL: Alert and in no  acute distress. CARDIOPULMONARY: No increased WOB. Speaking in clear sentences.  PSYCH: Pleasant and  cooperative. Speech normal rate and rhythm. Affect is appropriate. Insight and judgement are appropriate. Attention is focused, linear, and appropriate.  NEURO: Oriented as arrived to appointment on time with no prompting.   Lab Results  Component Value Date   CREATININE 0.63 06/01/2020   BUN 9 06/01/2020   NA 142 06/01/2020   K 4.3 06/01/2020   CL 103 06/01/2020   CO2 23 06/01/2020   Lab Results  Component Value Date   ALT 17 06/01/2020   AST 20 06/01/2020   ALKPHOS 72 06/01/2020   BILITOT <0.2 06/01/2020   Lab Results  Component Value Date   HGBA1C 8.6 (H) 06/01/2020   HGBA1C 7.3 (H) 01/21/2020   HGBA1C 6.7 (A) 02/18/2019   HGBA1C 7.8 (H) 09/19/2018   HGBA1C 8.0 (A) 05/31/2018   Lab Results  Component Value Date   INSULIN 24.3 06/01/2020   Lab Results  Component Value Date   TSH 1.450 06/01/2020   Lab Results  Component Value Date   CHOL 121 06/01/2020   HDL 56 06/01/2020   LDLCALC 35 06/01/2020   LDLDIRECT 80.0 09/19/2018   TRIG 187 (H) 06/01/2020   CHOLHDL 2 01/21/2020   Lab Results  Component Value Date   WBC 5.4 06/01/2020   HGB 11.8 06/01/2020   HCT 37.0 06/01/2020   MCV 87 06/01/2020   PLT 269 06/01/2020   Lab Results  Component Value Date   IRON 65 05/06/2010    Attestation Statements:   Reviewed by clinician on day of visit: allergies, medications, problem list, medical history, surgical history, family history, social history, and previous encounter notes.     I, Amanda Vasquez, am acting as transcriptionist for Amanda Common, MD.  I have reviewed the above documentation for accuracy and completeness, and I agree with the above. - Jinny Blossom, MD

## 2020-09-22 ENCOUNTER — Ambulatory Visit (INDEPENDENT_AMBULATORY_CARE_PROVIDER_SITE_OTHER): Payer: Medicare Other | Admitting: Pharmacist

## 2020-09-22 DIAGNOSIS — E785 Hyperlipidemia, unspecified: Secondary | ICD-10-CM | POA: Diagnosis not present

## 2020-09-22 DIAGNOSIS — E1169 Type 2 diabetes mellitus with other specified complication: Secondary | ICD-10-CM | POA: Diagnosis not present

## 2020-09-22 DIAGNOSIS — G890 Central pain syndrome: Secondary | ICD-10-CM

## 2020-09-22 DIAGNOSIS — E1165 Type 2 diabetes mellitus with hyperglycemia: Secondary | ICD-10-CM

## 2020-09-22 DIAGNOSIS — M546 Pain in thoracic spine: Secondary | ICD-10-CM

## 2020-09-22 DIAGNOSIS — F308 Other manic episodes: Secondary | ICD-10-CM

## 2020-09-22 NOTE — Patient Instructions (Signed)
Visit Information  PATIENT GOALS: Goals Addressed              This Visit's Progress     Patient Stated   .  Medication Monitoring (pt-stated)          Patient Goals/Self-Care Activities . Over the next 90 days, patient will:  - take medications as prescribed check glucose daily, document, and provide at future appointments collaborate with provider on medication access solutions target a minimum of 150 minutes of moderate intensity exercise weekly engage in dietary modifications by following eating plan prescribed by Weight Management       The patient verbalized understanding of instructions, educational materials, and care plan provided today and declined offer to receive copy of patient instructions, educational materials, and care plan.   Plan: Telephone follow up appointment with care management team member scheduled for:  ~ 8 weeks  Catie Darnelle Maffucci, PharmD, [place], [place] Clinical Pharmacist [employer] [facility] [employer] [phone]

## 2020-09-22 NOTE — Chronic Care Management (AMB) (Signed)
Chronic Care Management Pharmacy Note  09/22/2020 Name:  Amanda Vasquez MRN:  [id number] DOB:  30-Jun-1971  Subjective: Amanda Vasquez is an 51 y.o. year old female who is a primary patient of McLean-Scocuzza, Nino Glow, MD.  The CCM team was consulted for assistance with disease management and care coordination needs.    Engaged with patient by telephone for follow up visit in response to provider referral for pharmacy case management and/or care coordination services.   Consent to Services:  The patient was given information about Chronic Care Management services, agreed to services, and gave verbal consent prior to initiation of services.  Please see initial visit note for detailed documentation.   Objective:  Lab Results  Component Value Date   CREATININE 0.63 06/01/2020   CREATININE 0.74 01/21/2020   CREATININE 0.86 06/04/2019    Lab Results  Component Value Date   HGBA1C 8.6 (H) 06/01/2020       Component Value Date/Time   CHOL 121 06/01/2020 1010   TRIG 187 (H) 06/01/2020 1010   HDL 56 06/01/2020 1010   CHOLHDL 2 01/21/2020 1144   VLDL 39.2 01/21/2020 1144   LDLCALC 35 06/01/2020 1010   LDLDIRECT 80.0 09/19/2018 1139    Last vitamin D Lab Results  Component Value Date   VD25OH 32.2 06/01/2020     BP Readings from Last 3 Encounters:  09/01/20 116/71  08/10/20 112/61  07/30/20 110/68    Assessment: Review of patient past medical history, allergies, medications, health status, including review of consultants reports, laboratory and other test data, was performed as part of comprehensive evaluation and provision of chronic care management services.   SDOH:  (Social Determinants of Health) assessments and interventions performed:  SDOH Interventions   Flowsheet Row Most Recent Value  SDOH Interventions   Financial Strain Interventions Other (Comment)  [manufacturer assistance]      CCM Care Plan  Allergies  Allergen Reactions  . Latex Hives     Medications Reviewed Today    Reviewed by De Hollingshead, RPH-CPP (Pharmacist) on 09/22/20 at 1342  Med List Status: <None>  Medication Order Taking? Sig Documenting Provider Last Dose Status Informant  AJOVY 225 MG/1.5ML SOSY [id number] Yes INJECT 225MG  INTO THE SKIN EVERY 30 DAYS. Melvenia Beam, MD Taking Active   ALPRAZolam Duanne Moron) 0.5 MG tablet [id number] Yes Take 1 tablet (0.5 mg total) by mouth daily as needed for anxiety. McLean-Scocuzza, Nino Glow, MD Taking Active   atorvastatin (LIPITOR) 40 MG tablet [id number] Yes Take 1 tablet (40 mg total) by mouth daily at 6 PM. McLean-Scocuzza, Nino Glow, MD Taking Active   baclofen (LIORESAL) 10 MG tablet [id number] Yes Take 10 mg by mouth 2 (two) times daily.  Rowe Clack, MD Taking Active Self           Med Note De Hollingshead   Tue Sep 22, 2020  1:38 PM) 2-3 times daily per Dr. Hardin Negus  Botulinum Toxin Type A (BOTOX) 200 units SOLR [id number] Yes Provider to inject 155 units into the muscles of the head and neck every 3 months. Discard remainder. Melvenia Beam, MD Taking Active   buPROPion (WELLBUTRIN XL) 300 MG 24 hr tablet [id number] Yes TAKE ONE TABLET BY MOUTH EVERY DAY McLean-Scocuzza, Nino Glow, MD Taking Active   Cetirizine HCl 10 MG CAPS [id number] Yes Take 1 capsule (10 mg total) by mouth daily as needed. Take by mouth. Rowe Clack, MD Taking Active Self  ergocalciferol (VITAMIN D2) 1.25  MG (50000 UT) capsule [id number] Yes Take 1 capsule (50,000 Units total) by mouth once a week. Langston Reusing, MD Taking Active   GRALISE 600 MG TABS [id number] Yes Take 1,800 mg by mouth every evening.  [provider] Taking Active Self           Med Note Mitzie Na Oct 02, 2017  7:28 PM)    JARDIANCE 25 MG TABS tablet [id number] Yes TAKE ONE TABLET BY MOUTH EVERY DAY Carlus Pavlov, MD Taking Active   metFORMIN (GLUCOPHAGE) 1000 MG tablet [id number] Yes Take 1 tablet (1,000 mg total) by mouth 2 (two)  times daily with a meal. McLean-Scocuzza, Pasty Spillers, MD Taking Active   morphine (MS CONTIN) 60 MG 12 hr tablet [id number] Yes Take 60 mg by mouth every 12 (twelve) hours. [provider] Taking Active Self  oxyCODONE (OXY IR/ROXICODONE) 5 MG immediate release tablet [id number] Yes Take 1 tablet (5 mg total) by mouth every 4 (four) hours as needed for severe pain. Per pain clinic Dr Loraine Leriche, Raenette Rover, MD Taking Active Self  Polyethylene Glycol 3350 (MIRALAX PO) [id number] Yes Take by mouth as needed. [provider] Taking Active Self  promethazine (PHENERGAN) 25 MG tablet [id number] Yes Take 1 tablet (25 mg total) by mouth 2 (two) times daily as needed for nausea or vomiting. McLean-Scocuzza, Pasty Spillers, MD Taking Active   propranolol ER (INDERAL LA) 80 MG 24 hr capsule [id number] Yes TAKE 1 CAPSULE BY MOUTH ONCE DAILY Anson Fret, MD Taking Active   Rimegepant Sulfate (NURTEC) 75 MG TBDP [id number] Yes Take 75 mg by mouth daily as needed. For migraines. Take as close to onset of migraine as possible. One daily maximum. Anson Fret, MD Taking Active   sertraline (ZOLOFT) 100 MG tablet [id number] Yes Take 1.5 tablets (150 mg total) by mouth daily. McLean-Scocuzza, Pasty Spillers, MD Taking Active   traZODone (DESYREL) 150 MG tablet [id number] Yes Take 1 tablet (150 mg total) by mouth at bedtime as needed. for sleep McLean-Scocuzza, Pasty Spillers, MD Taking Active   warfarin (COUMADIN) 10 MG tablet [id number] Yes Take 1 tablet (10 mg total) by mouth as directed. 1 10 mg tablet once a week and then continue the 7.5 mg daily x 6 days a week ( pt already enough for 7.5 mg) Creig Hines, MD Taking Active   warfarin (COUMADIN) 7.5 MG tablet [id number] Yes 7.5mg  tablet (Take 1 1/2 tablets) 6 times a week, and uses (10 mg in separate rx 1 time a week)  Patient taking differently: Take 7.5 mg by mouth. 7.5mg  tablet (Take 1 1/2 tablets) 6 times a week, and uses (10 mg in separate rx 1 time a week)    Creig Hines, MD Taking Active           Patient Active Problem List   Diagnosis Date Noted  . Diabetes mellitus type 2 in obese ([name]) 06/05/2020  . Arthritis of right shoulder region 06/04/2020  . Acute meniscal tear of left knee   . Right shoulder pain 05/11/2020  . Chronic pain of right knee 01/21/2020  . Hypotension 01/21/2020  . Right knee meniscal tear 08/01/2019  . Anti-phospholipid antibody syndrome ([name]) 03/22/2019  . Indigestion 07/27/2018  . Chronic migraine without aura, with intractable migraine, so stated, with status migrainosus 01/29/2018  . Cervicalgia 01/12/2018  . Acute pain of left shoulder 01/12/2018  . Acute bilateral low back pain without sciatica 01/12/2018  . Muscle strain 01/12/2018  .  Breast pain in female 10/19/2017  . Vaginal bleeding 10/19/2017  . Pulmonary emboli ([name]) 10/02/2017  . Obesity (BMI 30-39.9) 07/24/2017  . Migraine headache 05/17/2017  . Anxiety and depression 01/30/2017  . Dot and blot hemorrhage, left 01/19/2017  . Gastroparesis 06/22/2016  . Fatigue 03/10/2016  . Pre-operative cardiovascular examination 09/21/2015  . 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 CTS (carpal tunnel syndrome) 02/25/2015  . Paresthesias 02/02/2015  . Demyelinating nervous system disease or syndrome ([name]) 01/20/2015  . Demyelinating disease of central nervous system ([name]) 03/01/2012  . Thalamic pain syndrome (hyperesthetic) 09/07/2010  . Hyperlipidemia 05/28/2010  . Class 1 obesity due to excess calories with serious comorbidity and body mass index (BMI) of 34.0 to 34.9 in adult 02/26/2010  . Herpes simplex virus (HSV) infection 12/21/2009  . ALLERGIC RHINITIS 12/21/2009  . BACK PAIN, THORACIC REGION 11/11/2009  . Type 2 diabetes mellitus with hyperglycemia, without long-term current use of insulin ([name]) 07/28/2008  . AMENORRHEA 12/17/2007  . DISORDER, BIPOLAR, ATYPICAL MANIC 05/14/2007    Conditions to be addressed/monitored: DMII, Depression and  Obesity, chronic pain  Care Plan : Medication Management  Updates made by Lourena Simmonds, RPH-CPP since 09/22/2020 12:00 AM    Problem: Diabetes, Obesity, Chronic Pain, APL hx VTE     Long-Range Goal: Disease Progression Prevention   Start Date: 09/22/2020  This Visit's Progress: On track  Priority: High  Note:   Current Barriers:  . Unable to independently afford treatment regimen . Complex patient with multiple comorbidities that increase risk of hospitalization, including depression and anxiety, chronic pain, migraines, periodic nausea and vomiting, gastroparesis, antiphospholipid antibody  Pharmacist Clinical Goal(s):  [name] Over the next 90 days, patient will achieve adherence to monitoring guidelines and medication adherence to achieve therapeutic efficacy through collaboration with PharmD and provider.   Interventions: . 1:1 collaboration with McLean-Scocuzza, Pasty Spillers, MD regarding development and update of comprehensive plan of care as evidenced by provider attestation and co-signature . Inter-disciplinary care team collaboration (see longitudinal plan of care) . Comprehensive medication review performed; medication list updated in electronic medical record  Diabetes/Obesity: . Uncontrolled but improved; current treatment: metformin 1000 mg BID, Jardiance 25 mg daily. Engaged w/ Dr. Elvera Lennox endocrinology and Weight Management o Previously on Trulicity, d/c d/t improvement in glucose readings along with concurrent nausea (that did not resolve w/ GLP1 discontinuation) . Current glucose readings: did not give me specific numbers today upon questioning, but notes they are improved. Due for repeat A1c . Current meal patterns: reports eating 2 meals daily w/ intermittent fasting. Collaboration with Weight Management regarding caloric and protein goals . Current exercise: limited lately by the cold, but usually more active walking outside.  . Discussed income. Patient over income for  Jardiance patient assistance. May qualify for Farxiga, however, patient notes her goal with endocrinology is to possibly discontinue Jardiance pending next A1c, so will not pursue SGLT2 assistance at this time. Continue to follow . Encouraged continued home BG monitoring along with dietary modification focus. Continue collaboration with Weight Management and Endocrinology  Hyperlipidemia and ASCVD risk reduction: . Controlled per last lipid panel; current treatment: atorvastatin 50 mg daily . Recommended to continue current regimen at this time  Depression/Anxiety: . Controlled per patient report; current treatment: alprazolam 0.5 mg daily PRN (reports infrequent use, but is unable to specify how often she needs) bupropion XL 300 mg daily, sertraline 150 mg daily, trazodone 150 mg QPM . Recommended to continue current regimen at this time  Chronic Pain: .  Managed per Dr. Eartha Inch Pain in [place]; morphine CR 60 mg BID,  oxycodone 5 mg Q4H PRN, baclofen 10 mg BID, gabapentin ER (Gralise) 600 mg daily . Reports Gralise is expensive. No patient assistance available at this time.  . Recommend to continue current regimen at this time along with collaboration with pain management  Antiphospholipid antibody syndrome: . Appropriately managed; current treatment: warfarin alternating 7.5 and 10 mg; managed by hem/onc Dr. Janese Banks . Home INR monitor, communicates w/ team via [place].  . Continue current regimen at this time along with collaboration with hematology. Review medication interactions whenever new medications are added in the future  Chronic Migraines: . Controlled per patient report; current treatment: Ajovy 225 mg monthly, Botox Q3 months, Nurtec 75 mg PRN; propranolol 80 mg daily; follows w/ Dr Jaynee Eagles . Patient assistance for [facility] is only for uninsured patients. However, Dr. Jaynee Eagles previously agreed to pursue assistance for [facility], as patient would qualify for [employer] assistance. Her  office returned patient assistance paperwork, but patient did not return her portion . Patient is still interested in pursuing [employer] assistance to aid in overall medication cost burden. Will collaborate w/ CPhT to re-mail patient her portion of the [employer] application and will collaborate w/ Dr. Jaynee Eagles.  . Recommend that patient continue current regimen at this time, and continue collaboration with neurology.    Patient Goals/Self-Care Activities . Over the next 90 days, patient will:  - take medications as prescribed check glucose daily, document, and provide at future appointments collaborate with provider on medication access solutions target a minimum of 150 minutes of moderate intensity exercise weekly engage in dietary modifications by following eating plan prescribed by Weight Management  Follow Up Plan: Telephone follow up appointment with care management team member scheduled for: ~ 8 weeks      Medication Assistance: Application for [employer]   medication assistance program. in process.  Anticipated assistance start date TBD.  See plan of care for additional detail.  Follow Up:  Patient agrees to Care Plan and Follow-up.  Plan: Telephone follow up appointment with care management team member scheduled for:  ~ 8 weeks  Catie Darnelle Maffucci, PharmD, [place], [place] Clinical Pharmacist [employer] at [employer] [phone]

## 2020-09-23 ENCOUNTER — Encounter: Payer: Self-pay | Admitting: *Deleted

## 2020-09-23 ENCOUNTER — Encounter: Payer: Self-pay | Admitting: Oncology

## 2020-09-28 ENCOUNTER — Telehealth: Payer: Self-pay | Admitting: *Deleted

## 2020-09-28 NOTE — Telephone Encounter (Signed)
Received [employer] prescriber form (from [employer]) for the [facility] patient assistance program. Prescription completed, signed by Dr Jaynee Eagles and was faxed back to [facility] at their request. Received a receipt of confirmation.

## 2020-09-29 ENCOUNTER — Ambulatory Visit (INDEPENDENT_AMBULATORY_CARE_PROVIDER_SITE_OTHER): Payer: Medicare Other | Admitting: Family Medicine

## 2020-09-29 ENCOUNTER — Encounter: Payer: Self-pay | Admitting: Oncology

## 2020-09-29 LAB — PROTIME-INR

## 2020-09-30 ENCOUNTER — Ambulatory Visit: Payer: Medicare Other | Admitting: Nurse Practitioner

## 2020-10-05 DIAGNOSIS — D6861 Antiphospholipid syndrome: Secondary | ICD-10-CM | POA: Diagnosis not present

## 2020-10-05 DIAGNOSIS — Z7901 Long term (current) use of anticoagulants: Secondary | ICD-10-CM | POA: Diagnosis not present

## 2020-10-06 ENCOUNTER — Ambulatory Visit (INDEPENDENT_AMBULATORY_CARE_PROVIDER_SITE_OTHER): Payer: Medicare Other | Admitting: Family Medicine

## 2020-10-06 ENCOUNTER — Encounter (INDEPENDENT_AMBULATORY_CARE_PROVIDER_SITE_OTHER): Payer: Self-pay | Admitting: Family Medicine

## 2020-10-06 ENCOUNTER — Other Ambulatory Visit: Payer: Self-pay

## 2020-10-06 ENCOUNTER — Other Ambulatory Visit: Payer: Self-pay | Admitting: *Deleted

## 2020-10-06 VITALS — BP 100/65 | HR 78 | Temp 98.3°F | Ht 65.0 in | Wt 190.0 lb

## 2020-10-06 DIAGNOSIS — E669 Obesity, unspecified: Secondary | ICD-10-CM

## 2020-10-06 DIAGNOSIS — E559 Vitamin D deficiency, unspecified: Secondary | ICD-10-CM

## 2020-10-06 DIAGNOSIS — E1165 Type 2 diabetes mellitus with hyperglycemia: Secondary | ICD-10-CM | POA: Diagnosis not present

## 2020-10-06 DIAGNOSIS — Z6830 Body mass index (BMI) 30.0-30.9, adult: Secondary | ICD-10-CM | POA: Diagnosis not present

## 2020-10-06 DIAGNOSIS — D6861 Antiphospholipid syndrome: Secondary | ICD-10-CM

## 2020-10-06 MED ORDER — ERGOCALCIFEROL 1.25 MG (50000 UT) PO CAPS: 50000.0000 [IU] | ORAL_CAPSULE | ORAL | 0 refills | Status: DC

## 2020-10-06 MED ORDER — WARFARIN SODIUM 7.5 MG PO TABS: 7.5000 mg | ORAL_TABLET | Freq: Every day | ORAL | 1 refills | Status: DC

## 2020-10-06 NOTE — Telephone Encounter (Signed)
Please advise 

## 2020-10-07 NOTE — Progress Notes (Signed)
Chief Complaint:   OBESITY Amanda Vasquez is here to discuss her progress with her obesity treatment plan along with follow-up of her obesity related diagnoses. Amanda Vasquez is on keeping a food journal and adhering to recommended goals of 1100-1400 calories and 90 g protein and states she is following her eating plan approximately 90% of the time. Amanda Vasquez states she is doing 0 minutes 0 times per week.  Today's visit was #: 8 Starting weight: 231 lbs Starting date: 06/01/2020 Today's weight: 190 lbs Today's date: 10/06/2020 Total lbs lost to date: 41 lbs Total lbs lost since last in-office visit: 6 lbs  Interim History: the last few weeks have been good. Amanda Vasquez is finding it harder for weight to continue to come off. She hasn't weighed less than 190 lbs in almost 20 years. She is not as active as she would like to be because of the weather. Pt does note that she's lost considerate muscle mass in the past years.  Subjective:   1. Vitamin D deficiency Pt denies nausea, vomiting, and muscle weakness but notes fatigue. Pt is on prescription Vit D. Her last Vit D level was 32.2.  2. Type 2 diabetes mellitus with hyperglycemia, without long-term current use of insulin ([name]) Amanda Vasquez is on Metformin and denies GI side effects. Her last A1c was 8.6 with an insulin level of 24.3.  Lab Results  Component Value Date   HGBA1C 8.6 (H) 06/01/2020   HGBA1C 7.3 (H) 01/21/2020   HGBA1C 6.7 (A) 02/18/2019   Lab Results  Component Value Date   MICROALBUR <0.2 01/23/2020   LDLCALC 35 06/01/2020   CREATININE 0.63 06/01/2020   Lab Results  Component Value Date   INSULIN 24.3 06/01/2020    Assessment/Plan:   1. Vitamin D deficiency Low Vitamin D level contributes to fatigue and are associated with obesity, breast, and colon cancer. She agrees to continue to take prescription Vitamin D @50 ,000 IU every week and will follow-up for routine testing of Vitamin D, at least 2-3 times per year to avoid over-replacement. -  ergocalciferol (VITAMIN D2) 1.25 MG (50000 UT) capsule; Take 1 capsule (50,000 Units total) by mouth once a week.  Dispense: 4 capsule; Refill: 0  2. Type 2 diabetes mellitus with hyperglycemia, without long-term current use of insulin ([name]) Good blood sugar control is important to decrease the likelihood of diabetic complications such as nephropathy, neuropathy, limb loss, blindness, coronary artery disease, and death. Intensive lifestyle modification including diet, exercise and weight loss are the first line of treatment for diabetes. Continue Metformin with no change in dose and repeat labs in April 2022.  3. Class 1 obesity with serious comorbidity and body mass index (BMI) of 30.0 to 30.9 in adult, unspecified obesity type Amanda Vasquez is currently in the action stage of change. As such, her goal is to continue with weight loss efforts. She has agreed to keeping a food journal and adhering to recommended goals of 1400-1600 calories and 90+ g protein.   Exercise goals: Still 15 minutes of resistance training 3-4 times a week.  Behavioral modification strategies: increasing lean protein intake, meal planning and cooking strategies, keeping healthy foods in the home, planning for success and keeping a strict food journal.  Amanda Vasquez has agreed to follow-up with our clinic in 2 weeks. She was informed of the importance of frequent follow-up visits to maximize her success with intensive lifestyle modifications for her multiple health conditions.   Objective:   Blood pressure 100/65, pulse 78, temperature 98.3  F (36.8 C), temperature source Oral, height 5\' 5"  (1.651 m), weight 190 lb (86.2 kg), last menstrual period 03/05/2018, SpO2 96 %. Body mass index is 31.62 kg/m.  General: Cooperative, alert, well developed, in no acute distress. HEENT: Conjunctivae and lids unremarkable. Cardiovascular: Regular rhythm.  Lungs: Normal work of breathing. Neurologic: No focal deficits.   Lab Results  Component  Value Date   CREATININE 0.63 06/01/2020   BUN 9 06/01/2020   NA 142 06/01/2020   K 4.3 06/01/2020   CL 103 06/01/2020   CO2 23 06/01/2020   Lab Results  Component Value Date   ALT 17 06/01/2020   AST 20 06/01/2020   ALKPHOS 72 06/01/2020   BILITOT <0.2 06/01/2020   Lab Results  Component Value Date   HGBA1C 8.6 (H) 06/01/2020   HGBA1C 7.3 (H) 01/21/2020   HGBA1C 6.7 (A) 02/18/2019   HGBA1C 7.8 (H) 09/19/2018   HGBA1C 8.0 (A) 05/31/2018   Lab Results  Component Value Date   INSULIN 24.3 06/01/2020   Lab Results  Component Value Date   TSH 1.450 06/01/2020   Lab Results  Component Value Date   CHOL 121 06/01/2020   HDL 56 06/01/2020   LDLCALC 35 06/01/2020   LDLDIRECT 80.0 09/19/2018   TRIG 187 (H) 06/01/2020   CHOLHDL 2 01/21/2020   Lab Results  Component Value Date   WBC 5.4 06/01/2020   HGB 11.8 06/01/2020   HCT 37.0 06/01/2020   MCV 87 06/01/2020   PLT 269 06/01/2020   Lab Results  Component Value Date   IRON 65 05/06/2010    Obesity Behavioral Intervention:   Approximately 15 minutes were spent on the discussion below.  ASK: We discussed the diagnosis of obesity with Amanda Vasquez today and Amanda Vasquez agreed to give [place] permission to discuss obesity behavioral modification therapy today.  ASSESS: Amanda Vasquez has the diagnosis of obesity and her BMI today is 31.6. Amanda Vasquez is in the action stage of change.   ADVISE: Amanda Vasquez was educated on the multiple health risks of obesity as well as the benefit of weight loss to improve her health. She was advised of the need for long term treatment and the importance of lifestyle modifications to improve her current health and to decrease her risk of future health problems.  AGREE: Multiple dietary modification options and treatment options were discussed and Amanda Vasquez agreed to follow the recommendations documented in the above note.  ARRANGE: Amanda Vasquez was educated on the importance of frequent visits to treat obesity as outlined per CMS and  USPSTF guidelines and agreed to schedule her next follow up appointment today.  Attestation Statements:   Reviewed by clinician on day of visit: allergies, medications, problem list, medical history, surgical history, family history, social history, and previous encounter notes.  Coral Ceo, am acting as transcriptionist for Coralie Common, MD.   I have reviewed the above documentation for accuracy and completeness, and I agree with the above. - Jinny Blossom, MD

## 2020-10-12 DIAGNOSIS — M62830 Muscle spasm of back: Secondary | ICD-10-CM | POA: Diagnosis not present

## 2020-10-12 DIAGNOSIS — G890 Central pain syndrome: Secondary | ICD-10-CM | POA: Diagnosis not present

## 2020-10-12 DIAGNOSIS — Z79891 Long term (current) use of opiate analgesic: Secondary | ICD-10-CM | POA: Diagnosis not present

## 2020-10-12 DIAGNOSIS — G894 Chronic pain syndrome: Secondary | ICD-10-CM | POA: Diagnosis not present

## 2020-10-13 LAB — PROTIME-INR: INR: 3.6 — AB (ref 0.9–1.1)

## 2020-10-14 ENCOUNTER — Encounter: Payer: Self-pay | Admitting: Oncology

## 2020-10-14 NOTE — Progress Notes (Signed)
No will hold still and check in 1 week

## 2020-10-15 ENCOUNTER — Telehealth: Payer: Self-pay

## 2020-10-15 ENCOUNTER — Telehealth: Payer: Self-pay | Admitting: *Deleted

## 2020-10-15 ENCOUNTER — Encounter: Payer: Self-pay | Admitting: *Deleted

## 2020-10-15 ENCOUNTER — Other Ambulatory Visit: Payer: Self-pay | Admitting: *Deleted

## 2020-10-15 ENCOUNTER — Encounter: Payer: Self-pay | Admitting: Nurse Practitioner

## 2020-10-15 ENCOUNTER — Ambulatory Visit: Payer: Medicare Other | Admitting: Nurse Practitioner

## 2020-10-15 VITALS — BP 94/56 | HR 80 | Ht 65.0 in | Wt 188.6 lb

## 2020-10-15 DIAGNOSIS — R1011 Right upper quadrant pain: Secondary | ICD-10-CM | POA: Diagnosis not present

## 2020-10-15 DIAGNOSIS — K5909 Other constipation: Secondary | ICD-10-CM

## 2020-10-15 DIAGNOSIS — Z86010 Personal history of colonic polyps: Secondary | ICD-10-CM | POA: Diagnosis not present

## 2020-10-15 DIAGNOSIS — R112 Nausea with vomiting, unspecified: Secondary | ICD-10-CM | POA: Diagnosis not present

## 2020-10-15 DIAGNOSIS — Z1211 Encounter for screening for malignant neoplasm of colon: Secondary | ICD-10-CM | POA: Diagnosis not present

## 2020-10-15 MED ORDER — ENOXAPARIN SODIUM 100 MG/ML ~~LOC~~ SOLN: 100.0000 mg | Freq: Two times a day (BID) | SUBCUTANEOUS | 0 refills | Status: DC

## 2020-10-15 MED ORDER — PLENVU 140 G PO SOLR: 1.0000 | ORAL | 0 refills | Status: DC

## 2020-10-15 NOTE — Telephone Encounter (Signed)
yes

## 2020-10-15 NOTE — Patient Instructions (Addendum)
If you are age 51 or older, your body mass index should be between 23-30. Your Body mass index is 31.38 kg/m. If this is out of the aforementioned range listed, please consider follow up with your Primary Care Provider.  If you are age 30 or younger, your body mass index should be between 19-25. Your Body mass index is 31.38 kg/m. If this is out of the aformentioned range listed, please consider follow up with your Primary Care Provider.   You have been scheduled for an endoscopy and colonoscopy. Please follow the written instructions given to you at your visit today. Please pick up your prep supplies at the pharmacy within the next 1-3 days. If you use inhalers (even only as needed), please bring them with you on the day of your procedure.  You will be contaced by our office prior to your procedure for directions on holding your Coumadin/Warfarin.  If you do not hear from our office 1 week prior to your scheduled procedure, please call [phone] to discuss.  START the Movantik that was provided by your Hematologist.  Pick up Glycerin Suppositories to use as needed from you you pharmacy. These are an over the counter item.  Follow up pending the results of your Colonoscopy/Endoscopy or as needed.  Thank you for entrusting me with your care and choosing [facility].  Tye Savoy, NP-C

## 2020-10-15 NOTE — Telephone Encounter (Signed)
Will your office be handling the bridge?
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
## 2020-10-15 NOTE — Telephone Encounter (Signed)
I called pt and the instructions are in the order but they needed me to clarify the dose. I ordred 100 mg syringe but she only needs to take 90 mg which would be .9 of every syringe. They just wanted to make sure what dose. I told them I put 100 because that is the closest prefill syringe to use for 90 mg.

## 2020-10-15 NOTE — Telephone Encounter (Signed)
Pharmacy called wanting a start date for the Lovenox dose change to 1 ml

## 2020-10-15 NOTE — Progress Notes (Signed)
____________________________________________________________  Attending physician addendum:  Thank you for sending this case to me. I have reviewed the entire note and agree with the plan.  It still seems likely that at least some of this patient's symptoms are related to GI dysmotility from chronic opioid use.  Please be certain she takes a 2-day bowel preparation for colonoscopy because of her chronic opioid use.  Wilfrid Lund, MD  ____________________________________________________________

## 2020-10-15 NOTE — Telephone Encounter (Signed)
[facility] Pre-operative Risk Assessment     Request for surgical clearance:     Endoscopy Procedure  What type of surgery is being performed?     Colonoscopy/Endoscopy  When is this surgery scheduled?     12/03/2020  What type of clearance is required ?   Pharmacy  Are there any medications that need to be held prior to surgery and how long? Coumadin starting 5 days prior  Practice name and name of physician performing surgery?      [facility]  What is your office phone and fax number?      Phone- [phone]  [phone]  Anesthesia type (None, local, MAC, general) ?       MAC

## 2020-10-15 NOTE — Telephone Encounter (Signed)
Patient will need to go on lovenox and stop coumadin 5 days prior. Take lovenox until morning prior but hold night prior to procedure. Go back to lovenox and coumadin after the procedure. Stop lovenox after inr is therapeutic on coumadin

## 2020-10-16 NOTE — Telephone Encounter (Signed)
Patient aware that her hematologist will be taking care of coumadin and Lovenox bridge.

## 2020-10-19 ENCOUNTER — Encounter: Payer: Self-pay | Admitting: *Deleted

## 2020-10-20 LAB — PROTIME-INR: INR: 4 — AB (ref 0.9–1.1)

## 2020-10-21 ENCOUNTER — Encounter: Payer: Self-pay | Admitting: *Deleted

## 2020-10-21 ENCOUNTER — Encounter: Payer: Self-pay | Admitting: Oncology

## 2020-10-21 ENCOUNTER — Encounter (INDEPENDENT_AMBULATORY_CARE_PROVIDER_SITE_OTHER): Payer: Self-pay | Admitting: Family Medicine

## 2020-10-21 ENCOUNTER — Other Ambulatory Visit: Payer: Self-pay

## 2020-10-21 ENCOUNTER — Ambulatory Visit (INDEPENDENT_AMBULATORY_CARE_PROVIDER_SITE_OTHER): Payer: Medicare Other | Admitting: Family Medicine

## 2020-10-21 VITALS — BP 103/65 | HR 77 | Temp 98.2°F | Ht 65.0 in | Wt 182.0 lb

## 2020-10-21 DIAGNOSIS — Z6830 Body mass index (BMI) 30.0-30.9, adult: Secondary | ICD-10-CM

## 2020-10-21 DIAGNOSIS — E669 Obesity, unspecified: Secondary | ICD-10-CM | POA: Diagnosis not present

## 2020-10-21 DIAGNOSIS — E785 Hyperlipidemia, unspecified: Secondary | ICD-10-CM | POA: Diagnosis not present

## 2020-10-21 DIAGNOSIS — E1169 Type 2 diabetes mellitus with other specified complication: Secondary | ICD-10-CM | POA: Diagnosis not present

## 2020-10-21 DIAGNOSIS — E1165 Type 2 diabetes mellitus with hyperglycemia: Secondary | ICD-10-CM | POA: Diagnosis not present

## 2020-10-21 DIAGNOSIS — E559 Vitamin D deficiency, unspecified: Secondary | ICD-10-CM | POA: Diagnosis not present

## 2020-10-22 ENCOUNTER — Encounter (INDEPENDENT_AMBULATORY_CARE_PROVIDER_SITE_OTHER): Payer: Self-pay | Admitting: Family Medicine

## 2020-10-22 LAB — COMPREHENSIVE METABOLIC PANEL
ALT: 18 IU/L (ref 0–32)
AST: 19 IU/L (ref 0–40)
Albumin/Globulin Ratio: 2 (ref 1.2–2.2)
Albumin: 4.5 g/dL (ref 3.8–4.8)
Alkaline Phosphatase: 72 IU/L (ref 44–121)
BUN/Creatinine Ratio: 32 — ABNORMAL HIGH (ref 9–23)
BUN: 23 mg/dL (ref 6–24)
Bilirubin Total: 0.2 mg/dL (ref 0.0–1.2)
CO2: 22 mmol/L (ref 20–29)
Calcium: 9.8 mg/dL (ref 8.7–10.2)
Chloride: 99 mmol/L (ref 96–106)
Creatinine, Ser: 0.71 mg/dL (ref 0.57–1.00)
Globulin, Total: 2.3 g/dL (ref 1.5–4.5)
Glucose: 107 mg/dL — ABNORMAL HIGH (ref 65–99)
Potassium: 4.4 mmol/L (ref 3.5–5.2)
Sodium: 139 mmol/L (ref 134–144)
Total Protein: 6.8 g/dL (ref 6.0–8.5)
eGFR: 104 mL/min/{1.73_m2} (ref >59)

## 2020-10-22 LAB — HEMOGLOBIN A1C
Est. average glucose Bld gHb Est-mCnc: 137 mg/dL
Hgb A1c MFr Bld: 6.4 % — ABNORMAL HIGH (ref 4.8–5.6)

## 2020-10-22 LAB — LIPID PANEL WITH LDL/HDL RATIO
Cholesterol, Total: 113 mg/dL (ref 100–199)
HDL: 41 mg/dL (ref >39)
LDL Chol Calc (NIH): 41 mg/dL (ref 0–99)
LDL/HDL Ratio: 1 ratio (ref 0.0–3.2)
Triglycerides: 192 mg/dL — ABNORMAL HIGH (ref 0–149)
VLDL Cholesterol Cal: 31 mg/dL (ref 5–40)

## 2020-10-22 LAB — VITAMIN D 25 HYDROXY (VIT D DEFICIENCY, FRACTURES): Vit D, 25-Hydroxy: 78.2 ng/mL (ref 30.0–100.0)

## 2020-10-22 NOTE — Progress Notes (Signed)
Chief Complaint:   OBESITY Amanda Vasquez is here to discuss her progress with her obesity treatment plan along with follow-up of her obesity related diagnoses. Amanda Vasquez is on keeping a food journal and adhering to recommended goals of 1400-1600 calories and 90+ grams of protein and states she is following her eating plan approximately 100% of the time. Amanda Vasquez states she is using resistance bands for 15-30 minutes 3 times per week.  Today's visit was #: 9 Starting weight: 231 lbs Starting date: 06/01/2020 Today's weight: 182 lbs Today's date: 10/21/2020 Total lbs lost to date: 49 lbs Total lbs lost since last in-office visit: 8 lbs  Interim History: Amanda Vasquez is journaling and very consistent with meeting protein and calorie goals.  She has done very well with weight loss and has lost a total of 49 pounds since 06/01/2020.  Subjective:   1. Type 2 diabetes mellitus with hyperglycemia, without long-term current use of insulin ([name]) Last A1c elevated at 8.6.  She is on Jardiance and metformin.  CBG usually 120 or so (fasting and 2 hour postprandial).  Denies hypoglycemia.  Lab Results  Component Value Date   HGBA1C 8.6 (H) 06/01/2020   HGBA1C 7.3 (H) 01/21/2020   Lab Results  Component Value Date   MICROALBUR <0.2 01/23/2020   Lab Results  Component Value Date   INSULIN 24.3 06/01/2020   2. Hyperlipidemia associated with type 2 diabetes mellitus ([name]) TG above goal at 187.  LDL at goal (35).  HDL at goal (56).  On Lipitor 40 mg daily.  Lab Results  Component Value Date   CHOL 121 06/01/2020   HDL 56 06/01/2020   LDLCALC 35 06/01/2020   LDLDIRECT 80.0 09/19/2018   TRIG 187 (H) 06/01/2020   CHOLHDL 2 01/21/2020   3. Vitamin D deficiency Vitamin D low at 32.2.  On weekly prescription vitamin D.  Assessment/Plan:   1. Type 2 diabetes mellitus with hyperglycemia, without long-term current use of insulin ([name]) Will check A1c and fasting glucose today.  - Hemoglobin A1c - Comprehensive  metabolic panel  2. Hyperlipidemia associated with type 2 diabetes mellitus ([place]) Check FLP today.  Continue Lipitor.  - Lipid Panel With LDL/HDL Ratio  3. Vitamin D deficiency Check vitamin D level today.  Continue prescription vitamin D.  - VITAMIN D 25 Hydroxy (Vit-D Deficiency, Fractures)  4. Class 1 obesity with serious comorbidity and body mass index (BMI) of 30.0 to 30.9 in adult, unspecified obesity type  Amanda Vasquez is currently in the action stage of change. As such, her goal is to continue with weight loss efforts. She has agreed to keeping a food journal and adhering to recommended goals of 1400-1600 calories and 90+ grams of protein.   Exercise goals: As is.  Behavioral modification strategies: planning for success.  Amanda Vasquez has agreed to follow-up with our clinic in 2-3 weeks.  Amanda Vasquez was informed we would discuss her lab results at her next visit unless there is a critical issue that needs to be addressed sooner. Amanda Vasquez agreed to keep her next visit at the agreed upon time to discuss these results.  Objective:   Blood pressure 103/65, pulse 77, temperature 98.2 F (36.8 C), temperature source Oral, height 5\' 5"  (1.651 m), weight 182 lb (82.6 kg), last menstrual period 03/05/2018, SpO2 97 %. Body mass index is 30.29 kg/m.  General: Cooperative, alert, well developed, in no acute distress. HEENT: Conjunctivae and lids unremarkable. Cardiovascular: Regular rhythm.  Lungs: Normal work of breathing. Neurologic: No focal  deficits.   Lab Results  Component Value Date   HGBA1C 8.6 (H) 06/01/2020   HGBA1C 7.3 (H) 01/21/2020   HGBA1C 6.7 (A) 02/18/2019   HGBA1C 7.8 (H) 09/19/2018   Lab Results  Component Value Date   INSULIN 24.3 06/01/2020   Lab Results  Component Value Date   TSH 1.450 06/01/2020   Lab Results  Component Value Date   LDLDIRECT 80.0 09/19/2018   CHOLHDL 2 01/21/2020   Lab Results  Component Value Date   WBC 5.4 06/01/2020   HGB 11.8 06/01/2020    HCT 37.0 06/01/2020   MCV 87 06/01/2020   PLT 269 06/01/2020   Lab Results  Component Value Date   IRON 65 05/06/2010   Obesity Behavioral Intervention:   Approximately 15 minutes were spent on the discussion below.  ASK: We discussed the diagnosis of obesity with [name] today and [name] agreed to give [place] permission to discuss obesity behavioral modification therapy today.  ASSESS: [name] has the diagnosis of obesity and her BMI today is 30.4. [name] is in the action stage of change.   ADVISE: [name] was educated on the multiple health risks of obesity as well as the benefit of weight loss to improve her health. She was advised of the need for long term treatment and the importance of lifestyle modifications to improve her current health and to decrease her risk of future health problems.  AGREE: Multiple dietary modification options and treatment options were discussed and [name] agreed to follow the recommendations documented in the above note.  ARRANGE: [name] was educated on the importance of frequent visits to treat obesity as outlined per CMS and USPSTF guidelines and agreed to schedule her next follow up appointment today.  Attestation Statements:   Reviewed by clinician on day of visit: allergies, medications, problem list, medical history, surgical history, family history, social history, and previous encounter notes.  I, Water quality scientist, CMA, am acting as Location manager for [employer], [place].  I have reviewed the above documentation for accuracy and completeness, and I agree with the above. -  Georgianne Fick, FNP

## 2020-10-26 LAB — PROTIME-INR: INR: 2.3 — AB (ref 0.9–1.1)

## 2020-10-27 ENCOUNTER — Encounter: Payer: Self-pay | Admitting: Oncology

## 2020-10-29 ENCOUNTER — Encounter: Payer: Self-pay | Admitting: *Deleted

## 2020-10-30 ENCOUNTER — Other Ambulatory Visit: Payer: Self-pay | Admitting: Neurology

## 2020-10-30 DIAGNOSIS — G43711 Chronic migraine without aura, intractable, with status migrainosus: Secondary | ICD-10-CM

## 2020-11-03 DIAGNOSIS — D6861 Antiphospholipid syndrome: Secondary | ICD-10-CM | POA: Diagnosis not present

## 2020-11-03 DIAGNOSIS — Z7901 Long term (current) use of anticoagulants: Secondary | ICD-10-CM | POA: Diagnosis not present

## 2020-11-03 LAB — PROTIME-INR: INR: 2.2 — AB (ref 0.9–1.1)

## 2020-11-04 ENCOUNTER — Other Ambulatory Visit: Payer: Self-pay | Admitting: *Deleted

## 2020-11-04 ENCOUNTER — Encounter: Payer: Self-pay | Admitting: Oncology

## 2020-11-04 ENCOUNTER — Encounter: Payer: Self-pay | Admitting: *Deleted

## 2020-11-04 DIAGNOSIS — D6861 Antiphospholipid syndrome: Secondary | ICD-10-CM

## 2020-11-04 MED ORDER — WARFARIN SODIUM 7.5 MG PO TABS: 7.5000 mg | ORAL_TABLET | Freq: Every day | ORAL | 1 refills | Status: DC

## 2020-11-05 ENCOUNTER — Encounter (INDEPENDENT_AMBULATORY_CARE_PROVIDER_SITE_OTHER): Payer: Self-pay

## 2020-11-09 ENCOUNTER — Ambulatory Visit (INDEPENDENT_AMBULATORY_CARE_PROVIDER_SITE_OTHER): Payer: Medicare Other | Admitting: Family Medicine

## 2020-11-09 ENCOUNTER — Encounter (INDEPENDENT_AMBULATORY_CARE_PROVIDER_SITE_OTHER): Payer: Self-pay | Admitting: Family Medicine

## 2020-11-09 ENCOUNTER — Other Ambulatory Visit: Payer: Self-pay

## 2020-11-09 VITALS — BP 92/56 | HR 72 | Temp 98.4°F | Ht 65.0 in | Wt 177.0 lb

## 2020-11-09 DIAGNOSIS — E669 Obesity, unspecified: Secondary | ICD-10-CM

## 2020-11-09 DIAGNOSIS — M62830 Muscle spasm of back: Secondary | ICD-10-CM | POA: Diagnosis not present

## 2020-11-09 DIAGNOSIS — Z6838 Body mass index (BMI) 38.0-38.9, adult: Secondary | ICD-10-CM | POA: Diagnosis not present

## 2020-11-09 DIAGNOSIS — E559 Vitamin D deficiency, unspecified: Secondary | ICD-10-CM

## 2020-11-09 DIAGNOSIS — Z79891 Long term (current) use of opiate analgesic: Secondary | ICD-10-CM | POA: Diagnosis not present

## 2020-11-09 DIAGNOSIS — G894 Chronic pain syndrome: Secondary | ICD-10-CM | POA: Diagnosis not present

## 2020-11-09 DIAGNOSIS — G890 Central pain syndrome: Secondary | ICD-10-CM | POA: Diagnosis not present

## 2020-11-09 DIAGNOSIS — E1165 Type 2 diabetes mellitus with hyperglycemia: Secondary | ICD-10-CM

## 2020-11-09 DIAGNOSIS — Z6830 Body mass index (BMI) 30.0-30.9, adult: Secondary | ICD-10-CM

## 2020-11-09 MED ORDER — ERGOCALCIFEROL 1.25 MG (50000 UT) PO CAPS: 50000.0000 [IU] | ORAL_CAPSULE | ORAL | 0 refills | Status: DC

## 2020-11-09 MED ORDER — VITAMIN D 125 MCG (5000 UT) PO CAPS: 1.0000 | ORAL_CAPSULE | Freq: Every day | ORAL | 0 refills | Status: DC

## 2020-11-10 ENCOUNTER — Ambulatory Visit (INDEPENDENT_AMBULATORY_CARE_PROVIDER_SITE_OTHER): Payer: Medicare Other | Admitting: Family Medicine

## 2020-11-10 LAB — PROTIME-INR: INR: 2.2 — AB (ref 0.9–1.1)

## 2020-11-10 NOTE — Progress Notes (Signed)
Chief Complaint:   OBESITY Annella is here to discuss her progress with her obesity treatment plan along with follow-up of her obesity related diagnoses. Bellamie is on keeping a food journal and adhering to recommended goals of 1400-1500 calories and 90 g protein and states she is following her eating plan approximately 98% of the time. Briony states she is walking, stretching, and doing housework 30 minutes 4-5 times per week.  Today's visit was #: 10 Starting weight: 231 lbs Starting date: 06/01/2020 Today's weight: 177 lbs Today's date: 11/09/2020 Total lbs lost to date: 54 lbs Total lbs lost since last in-office visit: 5 lbs  Interim History: Deann has done exceptionally well on our plan and has lost 54 lbs since Oct. Magalene feels like a "switch went off in my head", which has enabled her to stick to the plan. She has a BMI of 29 today. She is vegetarian. She supplements with protein powder and bars.  Subjective:   1. Vitamin D deficiency Guadalupe's Vitamin D level was 78.2 on 10/21/2020. She is currently taking prescription vitamin D 50,000 IU each week.    Ref. Range 10/21/2020 15:33  Vitamin D, 25-Hydroxy Latest Ref Range: 30.0 - 100.0 ng/mL 78.2   2. Type 2 diabetes mellitus with hyperglycemia, without long-term current use of insulin ([name]) Amoura's A1c is down to 6.4 from 8.6 on 06/01/2020. She is on Metformin and Jardiance.  Lab Results  Component Value Date   HGBA1C 6.4 (H) 10/21/2020   HGBA1C 8.6 (H) 06/01/2020   HGBA1C 7.3 (H) 01/21/2020   Lab Results  Component Value Date   MICROALBUR <0.2 01/23/2020   LDLCALC 41 10/21/2020   CREATININE 0.71 10/21/2020   Lab Results  Component Value Date   INSULIN 24.3 06/01/2020    Assessment/Plan:   1. Vitamin D deficiency  She agrees to continue to take prescription Vitamin D @50 ,000 IU every week for 4 more weeks, then switch to OTC Vitamin D 5,000 units daily. She will follow-up for routine testing of Vitamin D, at least 2-3 times  per year to avoid over-replacement.  - Cholecalciferol (VITAMIN D) 125 MCG (5000 UT) CAPS; Take 1 capsule by mouth daily.  Dispense: 30 capsule; Refill: 0  2. Type 2 diabetes mellitus with hyperglycemia, without long-term current use of insulin ([place]) . Continue Metformin and Jardiance.  3. Class 1 obesity with serious comorbidity and body mass index (BMI) of 30.0 to 30.9 in adult, unspecified obesity type Elsey is currently in the action stage of change. As such, her goal is to continue with weight loss efforts. She has agreed to keeping a food journal and adhering to recommended goals of 1400-1600 calories and 90 g protein.   Jameika Kinn to try to get protein from food, rather than supplements.  Exercise goals: As is  Behavioral modification strategies: increasing lean protein intake.  Janin has agreed to follow-up with our clinic in 3 weeks.   Objective:   Blood pressure (!) 92/56, pulse 72, temperature 98.4 F (36.9 C), height 5\' 5"  (1.651 m), weight 177 lb (80.3 kg), last menstrual period 03/05/2018, SpO2 97 %. Body mass index is 29.45 kg/m.  General: Cooperative, alert, well developed, in no acute distress. HEENT: Conjunctivae and lids unremarkable. Cardiovascular: Regular rhythm.  Lungs: Normal work of breathing. Neurologic: No focal deficits.   Lab Results  Component Value Date   CREATININE 0.71 10/21/2020   BUN 23 10/21/2020   NA 139 10/21/2020   K 4.4 10/21/2020  CL 99 10/21/2020   CO2 22 10/21/2020   Lab Results  Component Value Date   ALT 18 10/21/2020   AST 19 10/21/2020   ALKPHOS 72 10/21/2020   BILITOT 0.2 10/21/2020   Lab Results  Component Value Date   HGBA1C 6.4 (H) 10/21/2020   HGBA1C 8.6 (H) 06/01/2020   HGBA1C 7.3 (H) 01/21/2020   HGBA1C 6.7 (A) 02/18/2019   HGBA1C 7.8 (H) 09/19/2018   Lab Results  Component Value Date   INSULIN 24.3 06/01/2020   Lab Results  Component Value Date   TSH 1.450 06/01/2020   Lab Results  Component Value  Date   CHOL 113 10/21/2020   HDL 41 10/21/2020   LDLCALC 41 10/21/2020   LDLDIRECT 80.0 09/19/2018   TRIG 192 (H) 10/21/2020   CHOLHDL 2 01/21/2020   Lab Results  Component Value Date   WBC 5.4 06/01/2020   HGB 11.8 06/01/2020   HCT 37.0 06/01/2020   MCV 87 06/01/2020   PLT 269 06/01/2020   Lab Results  Component Value Date   IRON 65 05/06/2010    Obesity Behavioral Intervention:   Approximately 15 minutes were spent on the discussion below.  ASK: We discussed the diagnosis of obesity with Judithann today and Raye agreed to give [place] permission to discuss obesity behavioral modification therapy today.  ASSESS: Keyly has the diagnosis of obesity and her BMI today is 29.6. Krystyna is in the action stage of change.   ADVISE: Shaena was educated on the multiple health risks of obesity as well as the benefit of weight loss to improve her health. She was advised of the need for long term treatment and the importance of lifestyle modifications to improve her current health and to decrease her risk of future health problems.  AGREE: Multiple dietary modification options and treatment options were discussed and Alanys agreed to follow the recommendations documented in the above note.  ARRANGE: Brigetta was educated on the importance of frequent visits to treat obesity as outlined per CMS and USPSTF guidelines and agreed to schedule her next follow up appointment today.  Attestation Statements:   Reviewed by clinician on day of visit: allergies, medications, problem list, medical history, surgical history, family history, social history, and previous encounter notes.  Coral Ceo, am acting as Location manager for [employer], [place].  I have reviewed the above documentation for accuracy and completeness, and I agree with the above. -  Georgianne Fick, FNP

## 2020-11-11 ENCOUNTER — Encounter (INDEPENDENT_AMBULATORY_CARE_PROVIDER_SITE_OTHER): Payer: Self-pay | Admitting: Family Medicine

## 2020-11-12 ENCOUNTER — Encounter: Payer: Self-pay | Admitting: Oncology

## 2020-11-16 LAB — PROTIME-INR: INR: 3.3 — AB (ref 0.80–1.20)

## 2020-11-17 ENCOUNTER — Encounter: Payer: Self-pay | Admitting: *Deleted

## 2020-11-17 ENCOUNTER — Telehealth: Payer: Medicare Other

## 2020-11-17 ENCOUNTER — Encounter: Payer: Self-pay | Admitting: Oncology

## 2020-11-17 ENCOUNTER — Telehealth: Payer: Self-pay | Admitting: Pharmacist

## 2020-11-17 LAB — PROTIME-INR: INR: 1.5 — AB (ref 0.9–1.1)

## 2020-11-17 NOTE — Telephone Encounter (Signed)
  Chronic Care Management   Note  11/17/2020 Name: LAILANI TOOL MRN: [id number] DOB: 17-Oct-1970   Attempted to contact patient for scheduled appointment for medication management support. Left HIPAA compliant message for patient to return my call at their convenience.    Plan: - If I do not hear back from the patient by end of business today, will collaborate with Care Guide to outreach to schedule follow up with me  Catie Darnelle Maffucci, PharmD, [place], [place] Pharmacist [employer] at [employer] [phone]

## 2020-11-18 ENCOUNTER — Other Ambulatory Visit: Payer: Self-pay | Admitting: Internal Medicine

## 2020-11-18 DIAGNOSIS — F419 Anxiety disorder, unspecified: Secondary | ICD-10-CM

## 2020-11-18 DIAGNOSIS — F32A Depression, unspecified: Secondary | ICD-10-CM

## 2020-11-24 ENCOUNTER — Encounter: Payer: Self-pay | Admitting: *Deleted

## 2020-11-24 ENCOUNTER — Encounter: Payer: Self-pay | Admitting: Oncology

## 2020-11-24 LAB — PROTIME-INR: INR: 1.9 — AB (ref 0.9–1.1)

## 2020-11-25 ENCOUNTER — Telehealth: Payer: Self-pay

## 2020-11-25 NOTE — Chronic Care Management (AMB) (Signed)
  Care Management   Note  11/25/2020 Name: Amanda Vasquez MRN: [id number] DOB: 11-21-1970  [name] is a 50 y.o. year old female who is a primary care patient of McLean-Scocuzza, Nino Glow, MD and is actively engaged with the care management team. I reached out to Danelle Earthly by phone today to assist with re-scheduling a follow up visit with the Pharmacist  Follow up plan: Unsuccessful telephone outreach attempt made. A HIPAA compliant phone message was left for the patient providing contact information and requesting a return call.  The care management team will reach out to the patient again over the next 7 days.  If patient returns call to provider office, please advise to call [facility]  at [place], [place], [place], [place] [address] Direct Dial: [phone] [email or web address] Website: [email or web address]

## 2020-11-27 ENCOUNTER — Other Ambulatory Visit: Payer: Self-pay | Admitting: *Deleted

## 2020-11-27 MED ORDER — WARFARIN SODIUM 5 MG PO TABS: 5.0000 mg | ORAL_TABLET | Freq: Every day | ORAL | 1 refills | Status: DC

## 2020-11-27 NOTE — Telephone Encounter (Signed)
Patient called would like to know when she should be stopping her Coumadin medication. Please advise.

## 2020-11-30 ENCOUNTER — Telehealth: Payer: Self-pay | Admitting: Nurse Practitioner

## 2020-11-30 ENCOUNTER — Ambulatory Visit (INDEPENDENT_AMBULATORY_CARE_PROVIDER_SITE_OTHER): Payer: Medicare Other | Admitting: Family Medicine

## 2020-11-30 DIAGNOSIS — Z7901 Long term (current) use of anticoagulants: Secondary | ICD-10-CM | POA: Diagnosis not present

## 2020-11-30 DIAGNOSIS — D6861 Antiphospholipid syndrome: Secondary | ICD-10-CM | POA: Diagnosis not present

## 2020-11-30 LAB — PROTIME-INR: INR: 1.9 — AB (ref 0.9–1.1)

## 2020-11-30 NOTE — Telephone Encounter (Signed)
Patient has called her hematologist and has been instructed on what to do.

## 2020-11-30 NOTE — Telephone Encounter (Signed)
Patient is unable to get the pleuve bowel prep. States the coupons are not valid for Medicare part B patients if under 65 and medication cost $200 without coupon. Asks if there is anything else she can start. Her procedure is scheduled for 12/03/20

## 2020-11-30 NOTE — Telephone Encounter (Signed)
Called and spoke with patient she stated that she had taken the savings care with her and that when the ran it, it rejected due to her age. I reached out to her pharmacy they verified that due to the type of insurance she has that the discount card kicked back that she was under 72. The pharmacy let me know that they are currently waiting for an over ride so that the discount card will work.

## 2020-11-30 NOTE — Telephone Encounter (Signed)
Faxed savings card to pharmacy

## 2020-12-02 NOTE — Chronic Care Management (AMB) (Signed)
  Care Management   Note  12/02/2020 Name: [name] MRN: [id number] DOB: 11/05/1970  Amanda Vasquez is a 50 y.o. year old female who is a primary care patient of McLean-Scocuzza, Nino Glow, MD and is actively engaged with the care management team. I reached out to Danelle Earthly by phone today to assist with re-scheduling a follow up visit with the Pharmacist  Follow up plan: Unsuccessful telephone outreach attempt made. A HIPAA compliant phone message was left for the patient providing contact information and requesting a return call.  The care management team will reach out to the patient again over the next 7 days.  If patient returns call to provider office, please advise to call [facility]  at [place], [place], [place], [place] [address] Direct Dial: [phone] [email or web address] Website: [email or web address]

## 2020-12-03 ENCOUNTER — Ambulatory Visit (AMBULATORY_SURGERY_CENTER): Payer: Medicare Other | Admitting: Gastroenterology

## 2020-12-03 ENCOUNTER — Other Ambulatory Visit (INDEPENDENT_AMBULATORY_CARE_PROVIDER_SITE_OTHER): Payer: Medicare Other

## 2020-12-03 ENCOUNTER — Encounter: Payer: Self-pay | Admitting: Gastroenterology

## 2020-12-03 ENCOUNTER — Ambulatory Visit: Payer: Medicare Other | Admitting: Internal Medicine

## 2020-12-03 ENCOUNTER — Other Ambulatory Visit: Payer: Self-pay

## 2020-12-03 ENCOUNTER — Telehealth: Payer: Self-pay | Admitting: Gastroenterology

## 2020-12-03 VITALS — BP 129/72 | HR 82 | Temp 97.8°F | Resp 14 | Ht 71.0 in | Wt 177.0 lb

## 2020-12-03 DIAGNOSIS — R112 Nausea with vomiting, unspecified: Secondary | ICD-10-CM | POA: Diagnosis not present

## 2020-12-03 DIAGNOSIS — R1011 Right upper quadrant pain: Secondary | ICD-10-CM

## 2020-12-03 DIAGNOSIS — K3184 Gastroparesis: Secondary | ICD-10-CM

## 2020-12-03 DIAGNOSIS — Z538 Procedure and treatment not carried out for other reasons: Secondary | ICD-10-CM

## 2020-12-03 DIAGNOSIS — Z1211 Encounter for screening for malignant neoplasm of colon: Secondary | ICD-10-CM

## 2020-12-03 LAB — H. PYLORI ANTIBODY, IGG: H Pylori IgG: NEGATIVE

## 2020-12-03 MED ORDER — SODIUM CHLORIDE 0.9 % IV SOLN: 500.0000 mL | Freq: Once | INTRAVENOUS | Status: DC

## 2020-12-03 NOTE — Progress Notes (Signed)
To PACU, VSS. Report to [email or web address] 

## 2020-12-03 NOTE — Op Note (Signed)
[facility] Patient Name: Amanda Vasquez Procedure Date: 12/03/2020 1:52 PM MRN: [id number] Endoscopist: Mallie Mussel L. Loletha Carrow , MD Age: 50 Referring MD:  Date of Birth: June 11, 1971 Gender: Female Account #: [id number] Procedure:                Colonoscopy Indications:              Screening for colorectal malignant neoplasm, This                            is the patient's first colonoscopy Medicines:                Monitored Anesthesia Care Procedure:                Pre-Anesthesia Assessment:                           - Prior to the procedure, a History and Physical                            was performed, and patient medications and                            allergies were reviewed. The patient's tolerance of                            previous anesthesia was also reviewed. The risks                            and benefits of the procedure and the sedation                            options and risks were discussed with the patient.                            All questions were answered, and informed consent                            was obtained. Prior Anticoagulants: The patient                            last took Coumadin (warfarin) 5 days and Lovenox                            (enoxaparin) 1 day prior to the procedure. ASA                            Grade Assessment: III - A patient with severe                            systemic disease. After reviewing the risks and                            benefits, the patient was deemed in satisfactory  condition to undergo the procedure.                           After obtaining informed consent, the colonoscope                            was passed under direct vision. Throughout the                            procedure, the patient's blood pressure, pulse, and                            oxygen saturations were monitored continuously. The                            Olympus [id number] (#[id number]) Colonoscope  was                            introduced through the anus with the intention of                            advancing to the cecum. The scope was advanced to                            the ascending colon before the procedure was                            aborted. Medications were given. The colonoscopy                            was performed with difficulty due to inadequate                            bowel prep, a redundant colon and significant                            looping. The patient tolerated the procedure well.                            The quality of the bowel preparation was poor. The                            rectum and ascending colon (extent of insertion)                            were photographed. The bowel preparation used was 2                            day Plenvu/Miralax. Scope In: 2:02:24 PM Scope Out: 2:12:49 PM Total Procedure Duration: 0 hours 10 minutes 24 seconds  Findings:                 The perianal and digital rectal examinations were  normal.                           A large amount of semi-liquid stool was found in                            the entire colon, precluding visualization. Complications:            No immediate complications. Estimated Blood Loss:     Estimated blood loss: none. Impression:               - Preparation of the colon was poor. (chronic                            opioid therapy)                           - Stool in the entire examined colon.                           - No specimens collected. Recommendation:           - Patient has a contact number available for                            emergencies. The signs and symptoms of potential                            delayed complications were discussed with the                            patient. Return to normal activities tomorrow.                            Written discharge instructions were provided to the                            patient.                            - Resume previous diet.                           - Resume Coumadin (warfarin) today and Lovenox                            (enoxaparin) today at prior doses. Refer to                            managing physician for further adjustment of                            therapy.                           - Repeat colonoscopy in 6-12 months for screening  purposes. For that exam's prep, patient would need                            to be on miralax twice daily starting a week before                            exam, then ducolax 20 mg and Suprep or Plenvu 2                            days prior, then 4 liter PEG split prep evening                            before/AM day of procedure.                           - See the other procedure note for documentation of                            additional recommendations. Jackline Castilla L. Loletha Carrow, MD 12/03/2020 2:25:33 PM This report has been signed electronically.

## 2020-12-03 NOTE — Patient Instructions (Signed)
YOU HAD AN ENDOSCOPIC PROCEDURE TODAY AT THE [facility]:   Refer to the procedure report that was given to you for any specific questions about what was found during the examination.  If the procedure report does not answer your questions, please call your gastroenterologist to clarify.  If you requested that your care partner not be given the details of your procedure findings, then the procedure report has been included in a sealed envelope for you to review at your convenience later.  YOU SHOULD EXPECT: Some feelings of bloating in the abdomen. Passage of more gas than usual.  Walking can help get rid of the air that was put into your GI tract during the procedure and reduce the bloating. If you had a lower endoscopy (such as a colonoscopy or flexible sigmoidoscopy) you may notice spotting of blood in your stool or on the toilet paper. If you underwent a bowel prep for your procedure, you may not have a normal bowel movement for a few days.  Please Note:  You might notice some irritation and congestion in your nose or some drainage.  This is from the oxygen used during your procedure.  There is no need for concern and it should clear up in a day or so.  SYMPTOMS TO REPORT IMMEDIATELY:   Following lower endoscopy (colonoscopy or flexible sigmoidoscopy):  Excessive amounts of blood in the stool  Significant tenderness or worsening of abdominal pains  Swelling of the abdomen that is new, acute  Fever of 100F or higher   Following upper endoscopy (EGD)  Vomiting of blood or coffee ground material  New chest pain or pain under the shoulder blades  Painful or persistently difficult swallowing  New shortness of breath  Fever of 100F or higher  Black, tarry-looking stools  For urgent or emergent issues, a gastroenterologist can be reached at any hour by calling [phone]. Do not use MyChart messaging for urgent concerns.    DIET:  We do recommend a small meal at first, but  then you may proceed to your regular diet.  Drink plenty of fluids but you should avoid alcoholic beverages for 24 hours.  ACTIVITY:  You should plan to take it easy for the rest of today and you should NOT DRIVE or use heavy machinery until tomorrow (because of the sedation medicines used during the test).    FOLLOW UP: Our staff will call the number listed on your records 48-72 hours following your procedure to check on you and address any questions or concerns that you may have regarding the information given to you following your procedure. If we do not reach you, we will leave a message.  We will attempt to reach you two times.  During this call, we will ask if you have developed any symptoms of COVID 19. If you develop any symptoms (ie: fever, flu-like symptoms, shortness of breath, cough etc.) before then, please call [phone].  If you test positive for Covid 19 in the 2 weeks post procedure, please call and report this information to us.    If any biopsies were taken you will be contacted by phone or by letter within the next 1-3 weeks.  Please call us at [phone] if you have not heard about the biopsies in 3 weeks.    SIGNATURES/CONFIDENTIALITY: You and/or your care partner have signed paperwork which will be entered into your electronic medical record.  These signatures attest to the fact that that the information above on   your After Visit Summary has been reviewed and is understood.  Full responsibility of the confidentiality of this discharge information lies with you and/or your care-partner. 

## 2020-12-03 NOTE — Progress Notes (Signed)
VS- [facility]  Cell phone off per pt

## 2020-12-03 NOTE — Telephone Encounter (Signed)
Amanda Vasquez,  This patient had a colonoscopy and EGD with me today.  At a prior clinic visit she was noted to have had a colonoscopy report from 2003 indicating removal of a polyp.  When I spoke with [name] today, she was certain that she had not had a previous colonoscopy.  Closer examination of the 2003 colonoscopy report indicates that it has a different MR number on it, which means that it is someone else's report (presumably with the same name) in her chart.  I do not know the process for doing this, but please contact [facility] Epic chart management so this inaccuracy can be fixed.  - H. Loletha Carrow, MD

## 2020-12-03 NOTE — Op Note (Signed)
[facility] Patient Name: Amanda Vasquez Procedure Date: 12/03/2020 1:52 PM MRN: [id number] Endoscopist: Mallie Mussel L. Loletha Carrow , MD Age: 50 Referring MD:  Date of Birth: 11/17/1970 Gender: Female Account #: [id number] Procedure:                Upper GI endoscopy Indications:              Abdominal pain in the right upper quadrant, Nausea                            with vomiting Medicines:                Monitored Anesthesia Care Procedure:                Pre-Anesthesia Assessment:                           - Prior to the procedure, a History and Physical                            was performed, and patient medications and                            allergies were reviewed. The patient's tolerance of                            previous anesthesia was also reviewed. The risks                            and benefits of the procedure and the sedation                            options and risks were discussed with the patient.                            All questions were answered, and informed consent                            was obtained. Prior Anticoagulants: The patient                            last took Coumadin (warfarin) 5 days and Lovenox                            (enoxaparin) 1 day prior to the procedure. ASA                            Grade Assessment: III - A patient with severe                            systemic disease. After reviewing the risks and                            benefits, the patient was deemed in satisfactory  condition to undergo the procedure.                           After obtaining informed consent, the endoscope was                            passed under direct vision. Throughout the                            procedure, the patient's blood pressure, pulse, and                            oxygen saturations were monitored continuously. The                            Endoscope was introduced through the mouth, and                             advanced to the second part of duodenum. The upper                            GI endoscopy was accomplished without difficulty.                            The patient tolerated the procedure well. Scope In: Scope Out: Findings:                 The esophagus was normal.                           Diffuse moderately erythematous mucosa with bile                            staining and a small amount of bilious fluid                            without bleeding was found in the entire examined                            stomach.                           The exam of the stomach was otherwise normal.                           The examined duodenum was normal. Complications:            No immediate complications. Estimated Blood Loss:     Estimated blood loss: none. Impression:               - Normal esophagus.                           - Erythematous mucosa in the stomach.                           - Normal examined  duodenum.                           - No specimens collected. Recommendation:           - Patient has a contact number available for                            emergencies. The signs and symptoms of potential                            delayed complications were discussed with the                            patient. Return to normal activities tomorrow.                            Written discharge instructions were provided to the                            patient.                           - Resume previous diet.                           - Resume Coumadin (warfarin) today and Lovenox                            (enoxaparin) today at prior doses. Refer to                            managing physician for further adjustment of                            therapy.                           - Perform an H. pylori IgG serology. Sephiroth Mcluckie L. Loletha Carrow, MD 12/03/2020 2:30:45 PM This report has been signed electronically.

## 2020-12-07 NOTE — Telephone Encounter (Signed)
Request sent to medical records to have removed.

## 2020-12-08 ENCOUNTER — Telehealth: Payer: Self-pay | Admitting: *Deleted

## 2020-12-08 ENCOUNTER — Ambulatory Visit (INDEPENDENT_AMBULATORY_CARE_PROVIDER_SITE_OTHER): Payer: Medicare Other | Admitting: Neurology

## 2020-12-08 ENCOUNTER — Other Ambulatory Visit: Payer: Self-pay

## 2020-12-08 DIAGNOSIS — G890 Central pain syndrome: Secondary | ICD-10-CM | POA: Diagnosis not present

## 2020-12-08 DIAGNOSIS — G43711 Chronic migraine without aura, intractable, with status migrainosus: Secondary | ICD-10-CM | POA: Diagnosis not present

## 2020-12-08 DIAGNOSIS — Z79891 Long term (current) use of opiate analgesic: Secondary | ICD-10-CM | POA: Diagnosis not present

## 2020-12-08 DIAGNOSIS — G894 Chronic pain syndrome: Secondary | ICD-10-CM | POA: Diagnosis not present

## 2020-12-08 DIAGNOSIS — M62830 Muscle spasm of back: Secondary | ICD-10-CM | POA: Diagnosis not present

## 2020-12-08 LAB — PROTIME-INR: INR: 1.6 — AB (ref 0.9–1.1)

## 2020-12-08 NOTE — Telephone Encounter (Signed)
Procedures have been removed from her chart

## 2020-12-08 NOTE — Telephone Encounter (Signed)
  Follow up Call-  Call back number 12/03/2020  Post procedure Call Back phone  # [phone]  Permission to leave phone message Yes  Some recent data might be hidden     Patient questions:   Message left to call us if necessary.

## 2020-12-08 NOTE — Telephone Encounter (Signed)
  Follow up Call-  Call back number 12/03/2020  Post procedure Call Back phone  # [phone]  Permission to leave phone message Yes  Some recent data might be hidden     Patient questions:  Message left to call us if necessary.  Second call.

## 2020-12-08 NOTE — Progress Notes (Signed)
Consent Form Botulism Toxin Injection For Chronic Migraine  Interval history December 08, 2020: Patient is feeling a little under the weather today, however she still continues to have excellent response with her Botox, she is lost 70 pounds and she feels this is really helped her knees, [facility], she recently came off her warfarin in order to get a colonoscopy unfortunately now she has to bridge with Lovenox and she is very bruised and feeling a little under the weather especially given the rain which worsens her left-sided sensory loss and pain. Interval history 09/09/2020: Patient is 50 Continues excellent response, > 75% decrease frequency headaches and migraines. +a. Her 51 year old dog had pancreatitis, he is on insulin now,she has a new puppy about 51 months old, and still no worsening of migraines.   Interval history 04/21/2020: Patient is 50 Continues to do extremely well > 75% decrease freq headaches. +a  Reviewed orally with patient, additionally signature is on file:  Botulism toxin has been approved by the Federal drug administration for treatment of chronic migraine. Botulism toxin does not cure chronic migraine and it may not be effective in some patients.  The administration of botulism toxin is accomplished by injecting a small amount of toxin into the muscles of the neck and head. Dosage must be titrated for each individual. Any benefits resulting from botulism toxin tend to wear off after 3 months with a repeat injection required if benefit is to be maintained. Injections are usually done every 3-4 months with maximum effect peak achieved by about 2 or 3 weeks. Botulism toxin is expensive and you should be sure of what costs you will incur resulting from the injection.  The side effects of botulism toxin use for chronic migraine may include:   -Transient, and usually mild, facial weakness with facial injections  -Transient, and usually mild, head or neck weakness with  head/neck injections  -Reduction or loss of forehead facial animation due to forehead muscle weakness  -Eyelid drooping  -Dry eye  -Pain at the site of injection or bruising at the site of injection  -Double vision  -Potential unknown long term risks  Contraindications: You should not have Botox if you are pregnant, nursing, allergic to albumin, have an infection, skin condition, or muscle weakness at the site of the injection, or have myasthenia gravis, Lambert-Eaton syndrome, or ALS.  It is also possible that as with any injection, there may be an allergic reaction or no effect from the medication. Reduced effectiveness after repeated injections is sometimes seen and rarely infection at the injection site may occur. All care will be taken to prevent these side effects. If therapy is given over a long time, atrophy and wasting in the muscle injected may occur. Occasionally the patient's become refractory to treatment because they develop antibodies to the toxin. In this event, therapy needs to be modified.  I have read the above information and consent to the administration of botulism toxin.    BOTOX PROCEDURE NOTE FOR MIGRAINE HEADACHE    Contraindications and precautions discussed with patient(above). Aseptic procedure was observed and patient tolerated procedure. Procedure performed by Dr. Georgia Dom  The condition has existed for more than 6 months, and pt does not have a diagnosis of ALS, Myasthenia Gravis or Lambert-Eaton Syndrome.  Risks and benefits of injections discussed and pt agrees to proceed with the procedure.  Written consent obtained  These injections are medically necessary. Pt  receives good benefits from these injections. These injections  do not cause sedations or hallucinations which the oral therapies may cause.  Description of procedure:  The patient was placed in a sitting position. The standard protocol was used for Botox as follows, with 5 units of Botox  injected at each site:   -Procerus muscle, midline injection  -Corrugator muscle, bilateral injection  -Frontalis muscle, bilateral injection, with 2 sites each side, medial injection was performed in the upper one third of the frontalis muscle, in the region vertical from the medial inferior edge of the superior orbital rim. The lateral injection was again in the upper one third of the forehead vertically above the lateral limbus of the cornea, 1.5 cm lateral to the medial injection site.    -Temporalis muscle injection, 5 sites, bilaterally. The first injection was 3 cm above the tragus of the ear, second injection site was 1.5 cm to 3 cm up from the first injection site in line with the tragus of the ear. The third injection site was 1.5-3 cm forward between the first 2 injection sites. The fourth injection site was 1.5 cm posterior to the second injection site.   -Occipitalis muscle injection, 3 sites, bilaterally. The first injection was done one half way between the occipital protuberance and the tip of the mastoid process behind the ear. The second injection site was done lateral and superior to the first, 1 fingerbreadth from the first injection. The third injection site was 1 fingerbreadth superiorly and medially from the first injection site.  -Cervical paraspinal muscle injection, 2 sites, bilateral knee first injection site was 1 cm from the midline of the cervical spine, 3 cm inferior to the lower border of the occipital protuberance. The second injection site was 1.5 cm superiorly and laterally to the first injection site.  -Trapezius muscle injection was performed at 3 sites, bilaterally. The first injection site was in the upper trapezius muscle halfway between the inflection point of the neck, and the acromion. The second injection site was one half way between the acromion and the first injection site. The third injection was done between the first injection site and the inflection  point of the neck.   Will return for repeat injection in 3 months.   A 200 unit sof Botox was used, any Botox not injected was wasted. The patient tolerated the procedure well, there were no complications of the above procedure.  Performed by Dr. Jaynee Eagles M.D. 78ml Lidocaine 1%, 30-gauge needle was used. All procedures a documented were medically necessary, reasonable and appropriate based on the patient's history, medical diagnosis and physician opinion. Verbal informed consent was obtained from the patient, patient was informed of potential risk of procedure, including bruising, bleeding, hematoma formation, infection, muscle weakness, muscle pain, numbness, transient hypertension, transient hyperglycemia and transient insomnia among others. All areas injected were topically clean with isopropyl rubbing alcohol. Nonsterile nonlatex gloves were worn during the procedure.  Left pectoralis 3 injection sites based on palpation and pain. Used emg machine to target the muscle. Patient tolerated well no side effects. Felt better after procedure. Called her this evening, no side effects.

## 2020-12-08 NOTE — Telephone Encounter (Signed)
Noted, thanks!

## 2020-12-08 NOTE — Progress Notes (Signed)
Botox- 200 units x 1 vial Lot: [id number] Expiration: 06/2023 NDC: 4818-5631-49  Bacteriostatic 0.9% Sodium Chloride- 69mL total Lot: [id number] Expiration: 09/22/2021 NDC: 8588-5027-74  Dx: J28.786 B/B

## 2020-12-09 ENCOUNTER — Encounter: Payer: Self-pay | Admitting: Oncology

## 2020-12-09 ENCOUNTER — Encounter: Payer: Self-pay | Admitting: *Deleted

## 2020-12-10 ENCOUNTER — Ambulatory Visit (INDEPENDENT_AMBULATORY_CARE_PROVIDER_SITE_OTHER): Payer: Medicare Other | Admitting: Psychology

## 2020-12-10 DIAGNOSIS — F339 Major depressive disorder, recurrent, unspecified: Secondary | ICD-10-CM | POA: Diagnosis not present

## 2020-12-10 DIAGNOSIS — F411 Generalized anxiety disorder: Secondary | ICD-10-CM

## 2020-12-11 NOTE — Telephone Encounter (Signed)
Patient notified via My Chart

## 2020-12-11 NOTE — Chronic Care Management (AMB) (Signed)
  Care Management   Note  12/11/2020 Name: Amanda Vasquez MRN: [id number] DOB: 08-04-1971  [name] is a 50 y.o. year old female who is a primary care patient of McLean-Scocuzza, Nino Glow, MD and is actively engaged with the care management team. I reached out to Danelle Earthly by phone today to assist with re-scheduling a follow up visit with the Pharmacist  Follow up plan: Unable to make contact on outreach attempts x 3. PCP McLean-Scocuzza, Nino Glow, MD notified via routed documentation in medical record.   Noreene Larsson, [place], [place], [place] [address] Direct Dial: [phone] [email or web address] Website: [email or web address]

## 2020-12-11 NOTE — Telephone Encounter (Signed)
Third unsuccessful outreach  

## 2020-12-11 NOTE — Telephone Encounter (Signed)
Thank you.  Please inform the patient by portal message.  - HD

## 2020-12-14 ENCOUNTER — Encounter (INDEPENDENT_AMBULATORY_CARE_PROVIDER_SITE_OTHER): Payer: Self-pay | Admitting: Family Medicine

## 2020-12-14 ENCOUNTER — Ambulatory Visit (INDEPENDENT_AMBULATORY_CARE_PROVIDER_SITE_OTHER): Payer: Medicare Other | Admitting: Family Medicine

## 2020-12-14 ENCOUNTER — Other Ambulatory Visit: Payer: Self-pay

## 2020-12-14 VITALS — BP 104/62 | HR 80 | Temp 99.0°F | Ht 65.0 in | Wt 183.0 lb

## 2020-12-14 DIAGNOSIS — E785 Hyperlipidemia, unspecified: Secondary | ICD-10-CM

## 2020-12-14 DIAGNOSIS — Z6838 Body mass index (BMI) 38.0-38.9, adult: Secondary | ICD-10-CM

## 2020-12-14 DIAGNOSIS — E1169 Type 2 diabetes mellitus with other specified complication: Secondary | ICD-10-CM

## 2020-12-14 MED ORDER — OZEMPIC (0.25 OR 0.5 MG/DOSE) 2 MG/1.5ML ~~LOC~~ SOPN: 0.2500 mg | PEN_INJECTOR | SUBCUTANEOUS | 0 refills | Status: DC

## 2020-12-15 ENCOUNTER — Ambulatory Visit: Payer: Medicare Other | Admitting: Internal Medicine

## 2020-12-15 ENCOUNTER — Encounter (INDEPENDENT_AMBULATORY_CARE_PROVIDER_SITE_OTHER): Payer: Self-pay | Admitting: Family Medicine

## 2020-12-15 LAB — PROTIME-INR: INR: 2.3 — AB (ref 0.9–1.1)

## 2020-12-15 NOTE — Progress Notes (Signed)
Chief Complaint:   OBESITY Amanda Vasquez is here to discuss her progress with her obesity treatment plan along with follow-up of her obesity related diagnoses. Amanda Vasquez is on keeping a food journal and adhering to recommended goals of 1400-1600 calories and 90 protein and states she is following her eating plan approximately 90% of the time. Amanda Vasquez states she is not currently exercising.  Today's visit was #: 11 Starting weight: 231 lbs Starting date: 06/01/2020 Today's weight: 183 lbs Today's date: 12/14/2020 Total lbs lost to date: 48 lbs Total lbs lost since last in-office visit: 0  Interim History: Kale is up 6 lbs today (>5 lbs of water per bioimpedance). She did have a piece of cake on Easter, but overall stuck to the plan. She is disappointed in weight gain. She is meeting protein goals.  She has not felt up to going to the gym recently.  Subjective:   1. Type 2 diabetes mellitus with other specified complication, without long-term current use of insulin ([place]) Amanda Vasquez's diabetes is well controlled. Her last A1c 6.4. She is on Metformin and Jardiance. Pt denies hypoglycemia. We discussed starting Ozempic. She has gastroparesis and was advised GLP-1 could worsen this.   Lab Results  Component Value Date   HGBA1C 6.4 (H) 10/21/2020   HGBA1C 8.6 (H) 06/01/2020   HGBA1C 7.3 (H) 01/21/2020   Lab Results  Component Value Date   MICROALBUR <0.2 01/23/2020   LDLCALC 41 10/21/2020   CREATININE 0.71 10/21/2020   Lab Results  Component Value Date   INSULIN 24.3 06/01/2020   2. Hyperlipidemia, unspecified hyperlipidemia type Amanda Vasquez's last LDL was at goal (41). Triglycerides were elevated at 192 and HDL low at 41. She is on atorvastatin 40 mg QD.  Lab Results  Component Value Date   ALT 18 10/21/2020   AST 19 10/21/2020   ALKPHOS 72 10/21/2020   BILITOT 0.2 10/21/2020   Lab Results  Component Value Date   CHOL 113 10/21/2020   HDL 41 10/21/2020   LDLCALC 41 10/21/2020   LDLDIRECT 80.0  09/19/2018   TRIG 192 (H) 10/21/2020   CHOLHDL 2 01/21/2020    Assessment/Plan:   1. Type 2 diabetes mellitus with other specified complication, without long-term current use of insulin ([name])  Start Ozempic 0.25 mg, as prescribed below.  - Semaglutide,0.25 or 0.5MG /DOS, (OZEMPIC, 0.25 OR 0.5 MG/DOSE,) 2 MG/1.5ML SOPN; Inject 0.25 mg into the skin once a week.  Dispense: 1.5 mL; Refill: 0 Amanda Vasquez denies personal or family history of thyroid cancer, history of pancreatitis, or current cholelithiasis.She was informed of side effects.   2. Hyperlipidemia, unspecified hyperlipidemia type  Continue atorvastatin.  3. Obesity: Current BMI 30.45 Amanda Vasquez is currently in the action stage of change. As such, her goal is to continue with weight loss efforts. She has agreed to keeping a food journal and adhering to recommended goals of 1400-1600 calories and 90 grams protein.   Exercise goals:  Discussed adding some resistance with bands or hand weights.  Behavioral modification strategies: planning for success.  Amanda Vasquez has agreed to follow-up with our clinic in 3 weeks.   Objective:   Blood pressure 104/62, pulse 80, temperature 99 F (37.2 C), height 5\' 5"  (1.651 m), weight 183 lb (83 kg), last menstrual period 03/05/2018, SpO2 95 %. Body mass index is 30.45 kg/m.  General: Cooperative, alert, well developed, in no acute distress. HEENT: Conjunctivae and lids unremarkable. Cardiovascular: Regular rhythm.  Lungs: Normal work of breathing. Neurologic: No focal deficits.  Lab Results  Component Value Date   CREATININE 0.71 10/21/2020   BUN 23 10/21/2020   NA 139 10/21/2020   K 4.4 10/21/2020   CL 99 10/21/2020   CO2 22 10/21/2020   Lab Results  Component Value Date   ALT 18 10/21/2020   AST 19 10/21/2020   ALKPHOS 72 10/21/2020   BILITOT 0.2 10/21/2020   Lab Results  Component Value Date   HGBA1C 6.4 (H) 10/21/2020   HGBA1C 8.6 (H) 06/01/2020   HGBA1C 7.3 (H) 01/21/2020   HGBA1C  6.7 (A) 02/18/2019   HGBA1C 7.8 (H) 09/19/2018   Lab Results  Component Value Date   INSULIN 24.3 06/01/2020   Lab Results  Component Value Date   TSH 1.450 06/01/2020   Lab Results  Component Value Date   CHOL 113 10/21/2020   HDL 41 10/21/2020   LDLCALC 41 10/21/2020   LDLDIRECT 80.0 09/19/2018   TRIG 192 (H) 10/21/2020   CHOLHDL 2 01/21/2020   Lab Results  Component Value Date   WBC 5.4 06/01/2020   HGB 11.8 06/01/2020   HCT 37.0 06/01/2020   MCV 87 06/01/2020   PLT 269 06/01/2020   Lab Results  Component Value Date   IRON 65 05/06/2010    Obesity Behavioral Intervention:   Approximately 15 minutes were spent on the discussion below.  ASK: We discussed the diagnosis of obesity with Amanda Vasquez today and Amanda Vasquez agreed to give [place] permission to discuss obesity behavioral modification therapy today.  ASSESS: Amanda Vasquez has the diagnosis of obesity and her BMI today is 30.5. Amanda Vasquez is in the action stage of change.   ADVISE: Amanda Vasquez was educated on the multiple health risks of obesity as well as the benefit of weight loss to improve her health. She was advised of the need for long term treatment and the importance of lifestyle modifications to improve her current health and to decrease her risk of future health problems.  AGREE: Multiple dietary modification options and treatment options were discussed and Amanda Vasquez agreed to follow the recommendations documented in the above note.  ARRANGE: Amanda Vasquez was educated on the importance of frequent visits to treat obesity as outlined per CMS and USPSTF guidelines and agreed to schedule her next follow up appointment today.  Attestation Statements:   Reviewed by clinician on day of visit: allergies, medications, problem list, medical history, surgical history, family history, social history, and previous encounter notes.  Coral Ceo, am acting as Location manager for [employer], [place].  I have reviewed the above documentation for  accuracy and completeness, and I agree with the above. -  Georgianne Fick, FNP

## 2020-12-16 ENCOUNTER — Encounter: Payer: Self-pay | Admitting: Oncology

## 2020-12-17 ENCOUNTER — Encounter: Payer: Self-pay | Admitting: *Deleted

## 2020-12-18 ENCOUNTER — Other Ambulatory Visit (HOSPITAL_COMMUNITY): Payer: Self-pay

## 2020-12-22 ENCOUNTER — Ambulatory Visit: Payer: Medicare Other | Admitting: Internal Medicine

## 2020-12-22 LAB — PROTIME-INR: INR: 3.3 — AB (ref 0.9–1.1)

## 2020-12-23 ENCOUNTER — Encounter: Payer: Self-pay | Admitting: *Deleted

## 2020-12-23 ENCOUNTER — Encounter: Payer: Self-pay | Admitting: Oncology

## 2020-12-29 DIAGNOSIS — Z7901 Long term (current) use of anticoagulants: Secondary | ICD-10-CM | POA: Diagnosis not present

## 2020-12-29 DIAGNOSIS — D6861 Antiphospholipid syndrome: Secondary | ICD-10-CM | POA: Diagnosis not present

## 2020-12-29 LAB — PROTIME-INR: INR: 3.3 — AB (ref 0.9–1.1)

## 2020-12-30 ENCOUNTER — Encounter: Payer: Self-pay | Admitting: *Deleted

## 2020-12-30 ENCOUNTER — Other Ambulatory Visit: Payer: Self-pay | Admitting: *Deleted

## 2020-12-30 ENCOUNTER — Encounter: Payer: Self-pay | Admitting: Oncology

## 2020-12-30 MED ORDER — WARFARIN SODIUM 5 MG PO TABS: ORAL_TABLET | ORAL | 2 refills | Status: DC

## 2021-01-04 ENCOUNTER — Ambulatory Visit (INDEPENDENT_AMBULATORY_CARE_PROVIDER_SITE_OTHER): Payer: Medicare Other | Admitting: Family Medicine

## 2021-01-05 DIAGNOSIS — Z79891 Long term (current) use of opiate analgesic: Secondary | ICD-10-CM | POA: Diagnosis not present

## 2021-01-05 DIAGNOSIS — G890 Central pain syndrome: Secondary | ICD-10-CM | POA: Diagnosis not present

## 2021-01-05 DIAGNOSIS — G894 Chronic pain syndrome: Secondary | ICD-10-CM | POA: Diagnosis not present

## 2021-01-05 DIAGNOSIS — M62830 Muscle spasm of back: Secondary | ICD-10-CM | POA: Diagnosis not present

## 2021-01-06 ENCOUNTER — Ambulatory Visit: Payer: Medicare Other | Admitting: Internal Medicine

## 2021-01-06 ENCOUNTER — Encounter: Payer: Self-pay | Admitting: Oncology

## 2021-01-06 LAB — PROTIME-INR: INR: 2.7 — AB (ref 0.9–1.1)

## 2021-01-08 ENCOUNTER — Encounter: Payer: Self-pay | Admitting: *Deleted

## 2021-01-08 ENCOUNTER — Ambulatory Visit (INDEPENDENT_AMBULATORY_CARE_PROVIDER_SITE_OTHER): Payer: Medicare Other | Admitting: Psychology

## 2021-01-08 DIAGNOSIS — F339 Major depressive disorder, recurrent, unspecified: Secondary | ICD-10-CM | POA: Diagnosis not present

## 2021-01-08 DIAGNOSIS — F411 Generalized anxiety disorder: Secondary | ICD-10-CM

## 2021-01-12 ENCOUNTER — Other Ambulatory Visit: Payer: Self-pay | Admitting: Neurology

## 2021-01-12 ENCOUNTER — Other Ambulatory Visit: Payer: Self-pay | Admitting: Internal Medicine

## 2021-01-12 DIAGNOSIS — F32A Depression, unspecified: Secondary | ICD-10-CM

## 2021-01-12 DIAGNOSIS — G43711 Chronic migraine without aura, intractable, with status migrainosus: Secondary | ICD-10-CM

## 2021-01-12 DIAGNOSIS — F419 Anxiety disorder, unspecified: Secondary | ICD-10-CM

## 2021-01-12 LAB — PROTIME-INR: INR: 2.4 — AB (ref 0.9–1.1)

## 2021-01-12 NOTE — Telephone Encounter (Signed)
RX Refill:xanax Last Seen:06-04-20 Last ordered:11-18-20

## 2021-01-14 ENCOUNTER — Encounter: Payer: Self-pay | Admitting: *Deleted

## 2021-01-14 ENCOUNTER — Encounter: Payer: Self-pay | Admitting: Oncology

## 2021-01-19 LAB — PROTIME-INR: INR: 2.4 — AB (ref 0.9–1.1)

## 2021-01-20 ENCOUNTER — Ambulatory Visit (INDEPENDENT_AMBULATORY_CARE_PROVIDER_SITE_OTHER): Payer: Medicare Other | Admitting: Internal Medicine

## 2021-01-20 ENCOUNTER — Encounter: Payer: Self-pay | Admitting: Oncology

## 2021-01-20 ENCOUNTER — Other Ambulatory Visit: Payer: Self-pay

## 2021-01-20 ENCOUNTER — Encounter: Payer: Self-pay | Admitting: Internal Medicine

## 2021-01-20 VITALS — BP 116/68 | HR 78 | Temp 97.7°F | Ht 65.0 in | Wt 177.2 lb

## 2021-01-20 DIAGNOSIS — E785 Hyperlipidemia, unspecified: Secondary | ICD-10-CM

## 2021-01-20 DIAGNOSIS — Z1389 Encounter for screening for other disorder: Secondary | ICD-10-CM | POA: Diagnosis not present

## 2021-01-20 DIAGNOSIS — G43711 Chronic migraine without aura, intractable, with status migrainosus: Secondary | ICD-10-CM

## 2021-01-20 DIAGNOSIS — E1165 Type 2 diabetes mellitus with hyperglycemia: Secondary | ICD-10-CM | POA: Diagnosis not present

## 2021-01-20 DIAGNOSIS — F419 Anxiety disorder, unspecified: Secondary | ICD-10-CM

## 2021-01-20 DIAGNOSIS — F32A Depression, unspecified: Secondary | ICD-10-CM | POA: Diagnosis not present

## 2021-01-20 DIAGNOSIS — Z0000 Encounter for general adult medical examination without abnormal findings: Secondary | ICD-10-CM

## 2021-01-20 DIAGNOSIS — F5104 Psychophysiologic insomnia: Secondary | ICD-10-CM | POA: Insufficient documentation

## 2021-01-20 MED ORDER — PROPRANOLOL HCL ER 80 MG PO CP24: 80.0000 mg | ORAL_CAPSULE | Freq: Every day | ORAL | 3 refills | Status: DC

## 2021-01-20 MED ORDER — BUPROPION HCL ER (XL) 300 MG PO TB24: ORAL_TABLET | ORAL | 3 refills | Status: DC

## 2021-01-20 MED ORDER — METFORMIN HCL 1000 MG PO TABS: 1000.0000 mg | ORAL_TABLET | Freq: Two times a day (BID) | ORAL | 3 refills | Status: DC

## 2021-01-20 MED ORDER — TRAZODONE HCL 100 MG PO TABS: 100.0000 mg | ORAL_TABLET | Freq: Every evening | ORAL | 3 refills | Status: DC | PRN

## 2021-01-20 MED ORDER — ATORVASTATIN CALCIUM 40 MG PO TABS: 40.0000 mg | ORAL_TABLET | Freq: Every day | ORAL | 3 refills | Status: DC
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
## 2021-01-20 NOTE — Patient Instructions (Addendum)
Zoster Vaccine, Recombinant injection 2nd dose 2 months to less than 6 months What is this medicine? ZOSTER VACCINE (ZOS ter vak SEEN) is a vaccine used to reduce the risk of getting shingles. This vaccine is not used to treat shingles or nerve pain from shingles. This medicine may be used for other purposes; ask your health care provider or pharmacist if you have questions. COMMON BRAND NAME(S): [facility] What should I tell my health care provider before I take this medicine? They need to know if you have any of these conditions:  cancer  immune system problems  an unusual or allergic reaction to Zoster vaccine, other medications, foods, dyes, or preservatives  pregnant or trying to get pregnant  breast-feeding How should I use this medicine? This vaccine is injected into a muscle. It is given by a health care provider. A copy of Vaccine Information Statements will be given before each vaccination. Be sure to read this information carefully each time. This sheet may change often. Talk to your health care provider about the use of this vaccine in children. This vaccine is not approved for use in children. Overdosage: If you think you have taken too much of this medicine contact a poison control center or emergency room at once. NOTE: This medicine is only for you. Do not share this medicine with others. What if I miss a dose? Keep appointments for follow-up (booster) doses. It is important not to miss your dose. Call your health care provider if you are unable to keep an appointment. What may interact with this medicine?  medicines that suppress your immune system  medicines to treat cancer  steroid medicines like prednisone or cortisone This list may not describe all possible interactions. Give your health care provider a list of all the medicines, herbs, non-prescription drugs, or dietary supplements you use. Also tell them if you smoke, drink alcohol, or use illegal drugs. Some  items may interact with your medicine. What should I watch for while using this medicine? Visit your health care provider regularly. This vaccine, like all vaccines, may not fully protect everyone. What side effects may I notice from receiving this medicine? Side effects that you should report to your doctor or health care professional as soon as possible:  allergic reactions (skin rash, itching or hives; swelling of the face, lips, or tongue)  trouble breathing Side effects that usually do not require medical attention (report these to your doctor or health care professional if they continue or are bothersome):  chills  headache  fever  nausea  pain, redness, or irritation at site where injected  tiredness  vomiting This list may not describe all possible side effects. Call your doctor for medical advice about side effects. You may report side effects to FDA at 1-800-FDA-1088. Where should I keep my medicine? This vaccine is only given by a health care provider. It will not be stored at home. NOTE: This sheet is a summary. It may not cover all possible information. If you have questions about this medicine, talk to your doctor, pharmacist, or health care provider.  2021 Elsevier/Gold Standard (2019-09-13 16:23:07)

## 2021-01-26 ENCOUNTER — Encounter (INDEPENDENT_AMBULATORY_CARE_PROVIDER_SITE_OTHER): Payer: Self-pay | Admitting: Family Medicine

## 2021-01-26 ENCOUNTER — Other Ambulatory Visit: Payer: Self-pay

## 2021-01-26 ENCOUNTER — Ambulatory Visit (INDEPENDENT_AMBULATORY_CARE_PROVIDER_SITE_OTHER): Payer: Medicare Other | Admitting: Family Medicine

## 2021-01-26 ENCOUNTER — Other Ambulatory Visit (INDEPENDENT_AMBULATORY_CARE_PROVIDER_SITE_OTHER): Payer: Self-pay | Admitting: Family Medicine

## 2021-01-26 VITALS — BP 99/65 | HR 75 | Temp 98.1°F | Ht 65.0 in | Wt 169.0 lb

## 2021-01-26 DIAGNOSIS — E1169 Type 2 diabetes mellitus with other specified complication: Secondary | ICD-10-CM

## 2021-01-26 DIAGNOSIS — Z6838 Body mass index (BMI) 38.0-38.9, adult: Secondary | ICD-10-CM

## 2021-01-26 DIAGNOSIS — E785 Hyperlipidemia, unspecified: Secondary | ICD-10-CM

## 2021-01-26 MED ORDER — OZEMPIC (0.25 OR 0.5 MG/DOSE) 2 MG/1.5ML ~~LOC~~ SOPN: 0.5000 mg | PEN_INJECTOR | SUBCUTANEOUS | 0 refills | Status: DC

## 2021-01-26 NOTE — Telephone Encounter (Signed)
Last seen Dawn 

## 2021-01-27 LAB — HM DIABETES EYE EXAM

## 2021-02-01 ENCOUNTER — Encounter (INDEPENDENT_AMBULATORY_CARE_PROVIDER_SITE_OTHER): Payer: Self-pay | Admitting: Family Medicine

## 2021-02-01 NOTE — Progress Notes (Signed)
Chief Complaint:   OBESITY Brodie is here to discuss her progress with her obesity treatment plan along with follow-up of her obesity related diagnoses. Marquesha is on keeping a food journal and adhering to recommended goals of 1600 calories and 90 grams of protein and states she is following her eating plan approximately 100% of the time. Josalin states she is walking and doing yardwork for 20 minutes 3-4 times per week.  Today's visit was #: 12 Starting weight: 231 lbs Starting date: 06/01/2020 Today's weight: 169 lbs Today's date: 01/26/2021 Total lbs lost to date: 62 lbs Total lbs lost since last in-office visit: 14 lbs  Interim History: Wyllow has done extremely well on the plan.  We discussed her weight goal and decided on 160 pounds (26 BMI).  She is 169 lbs today. She is journaling daily and meeting protein goals.  Averages 1000 calories per day.  Subjective:   1. Type 2 diabetes mellitus with other specified complication, without long-term current use of insulin ([name]) Well controlled.  Last A1c was 6.4.  Ozempic started last office visit.  It is helping with appetite and satiety.  Endorses mild nausea, which has subsided.  Also on Jardiance and metformin.  Lab Results  Component Value Date   HGBA1C 6.4 (H) 10/21/2020   HGBA1C 8.6 (H) 06/01/2020   HGBA1C 7.3 (H) 01/21/2020   Lab Results  Component Value Date   MICROALBUR <0.2 01/23/2020   LDLCALC 41 10/21/2020   CREATININE 0.71 10/21/2020   Lab Results  Component Value Date   INSULIN 24.3 06/01/2020    2. Hyperlipidemia associated with type 2 diabetes mellitus ([name]) LDL at goal (41).  TG elevated at last check (192).  HDL low at 42.  On 40 mg atorvastatin.  Lab Results  Component Value Date   ALT 18 10/21/2020   AST 19 10/21/2020   ALKPHOS 72 10/21/2020   BILITOT 0.2 10/21/2020   Lab Results  Component Value Date   CHOL 113 10/21/2020   HDL 41 10/21/2020   LDLCALC 41 10/21/2020   LDLDIRECT 80.0 09/19/2018   TRIG  192 (H) 10/21/2020   CHOLHDL 2 01/21/2020   Assessment/Plan:   1. Type 2 diabetes mellitus with other specified complication, without long-term current use of insulin ([name]) Refill Ozempic 0.5 mg subcutaneously weekly.  Continue metformin and Jardiance.  - Refill Semaglutide,0.25 or 0.5MG /DOS, (OZEMPIC, 0.25 OR 0.5 MG/DOSE,) 2 MG/1.5ML SOPN; Inject 0.5 mg into the skin once a week.  Dispense: 1.5 mL; Refill: 0  2. Hyperlipidemia associated with type 2 diabetes mellitus ([name]) Continue Lipitor.  3. Obesity: Current BMI 28.12  Johnnay is currently in the action stage of change. As such, her goal is to continue with weight loss efforts. She has agreed to keeping a food journal and adhering to recommended goals of 1500 calories and 90 grams of protein.   She will work on getting in more calories.   Exercise goals:  She will concentrate on resistance training 2-3 times per week.  Behavioral modification strategies: increasing lean protein intake.  Shalinda has agreed to follow-up with our clinic in 4 weeks.    Objective:   Blood pressure 99/65, pulse 75, temperature 98.1 F (36.7 C), height 5\' 5"  (1.651 m), weight 169 lb (76.7 kg), last menstrual period 03/05/2018, SpO2 97 %. Body mass index is 28.12 kg/m.  General: Cooperative, alert, well developed, in no acute distress. HEENT: Conjunctivae and lids unremarkable. Cardiovascular: Regular rhythm.  Lungs: Normal work of breathing.  Neurologic: No focal deficits.   Lab Results  Component Value Date   CREATININE 0.71 10/21/2020   BUN 23 10/21/2020   NA 139 10/21/2020   K 4.4 10/21/2020   CL 99 10/21/2020   CO2 22 10/21/2020   Lab Results  Component Value Date   ALT 18 10/21/2020   AST 19 10/21/2020   ALKPHOS 72 10/21/2020   BILITOT 0.2 10/21/2020   Lab Results  Component Value Date   HGBA1C 6.4 (H) 10/21/2020   HGBA1C 8.6 (H) 06/01/2020   HGBA1C 7.3 (H) 01/21/2020   HGBA1C 6.7 (A) 02/18/2019   HGBA1C 7.8 (H) 09/19/2018    Lab Results  Component Value Date   INSULIN 24.3 06/01/2020   Lab Results  Component Value Date   TSH 1.450 06/01/2020   Lab Results  Component Value Date   CHOL 113 10/21/2020   HDL 41 10/21/2020   LDLCALC 41 10/21/2020   LDLDIRECT 80.0 09/19/2018   TRIG 192 (H) 10/21/2020   CHOLHDL 2 01/21/2020   Lab Results  Component Value Date   WBC 5.4 06/01/2020   HGB 11.8 06/01/2020   HCT 37.0 06/01/2020   MCV 87 06/01/2020   PLT 269 06/01/2020   Lab Results  Component Value Date   IRON 65 05/06/2010   Obesity Behavioral Intervention:   Approximately 15 minutes were spent on the discussion below.  ASK: We discussed the diagnosis of obesity with Korbyn today and Lillien agreed to give [place] permission to discuss obesity behavioral modification therapy today.  ASSESS: Nakiah has the diagnosis of obesity and her BMI today is 28.2. Jermeka is in the action stage of change.   ADVISE: Dylynn was educated on the multiple health risks of obesity as well as the benefit of weight loss to improve her health. She was advised of the need for long term treatment and the importance of lifestyle modifications to improve her current health and to decrease her risk of future health problems.  AGREE: Multiple dietary modification options and treatment options were discussed and Airianna agreed to follow the recommendations documented in the above note.  ARRANGE: Kjerstin was educated on the importance of frequent visits to treat obesity as outlined per CMS and USPSTF guidelines and agreed to schedule her next follow up appointment today.  Attestation Statements:   Reviewed by clinician on day of visit: allergies, medications, problem list, medical history, surgical history, family history, social history, and previous encounter notes.  I, Water quality scientist, CMA, am acting as Location manager for [employer], [place].  I have reviewed the above documentation for accuracy and completeness, and I agree with the above. -   Georgianne Fick, FNP

## 2021-02-02 DIAGNOSIS — D6861 Antiphospholipid syndrome: Secondary | ICD-10-CM | POA: Diagnosis not present

## 2021-02-02 DIAGNOSIS — Z7901 Long term (current) use of anticoagulants: Secondary | ICD-10-CM | POA: Diagnosis not present

## 2021-02-02 LAB — PROTIME-INR: INR: 4.3 — AB (ref 0.9–1.1)

## 2021-02-03 ENCOUNTER — Ambulatory Visit (INDEPENDENT_AMBULATORY_CARE_PROVIDER_SITE_OTHER): Payer: Medicare Other | Admitting: Psychology

## 2021-02-03 ENCOUNTER — Encounter: Payer: Self-pay | Admitting: Oncology

## 2021-02-03 DIAGNOSIS — F411 Generalized anxiety disorder: Secondary | ICD-10-CM | POA: Diagnosis not present

## 2021-02-03 DIAGNOSIS — F339 Major depressive disorder, recurrent, unspecified: Secondary | ICD-10-CM

## 2021-02-04 DIAGNOSIS — G894 Chronic pain syndrome: Secondary | ICD-10-CM | POA: Diagnosis not present

## 2021-02-04 DIAGNOSIS — M62830 Muscle spasm of back: Secondary | ICD-10-CM | POA: Diagnosis not present

## 2021-02-04 DIAGNOSIS — Z79891 Long term (current) use of opiate analgesic: Secondary | ICD-10-CM | POA: Diagnosis not present

## 2021-02-04 DIAGNOSIS — G890 Central pain syndrome: Secondary | ICD-10-CM | POA: Diagnosis not present

## 2021-02-09 LAB — PROTIME-INR: INR: 2.6 — AB (ref 0.9–1.1)

## 2021-02-10 ENCOUNTER — Encounter: Payer: Self-pay | Admitting: Oncology

## 2021-02-13 ENCOUNTER — Other Ambulatory Visit: Payer: Self-pay

## 2021-02-13 ENCOUNTER — Encounter: Payer: Self-pay | Admitting: Emergency Medicine

## 2021-02-13 ENCOUNTER — Emergency Department
Admission: EM | Admit: 2021-02-13 | Discharge: 2021-02-13 | Disposition: A | Discharge status: Home / Self Care | Payer: Medicare Other | Attending: Emergency Medicine | Admitting: Emergency Medicine

## 2021-02-13 ENCOUNTER — Emergency Department: Payer: Medicare Other

## 2021-02-13 DIAGNOSIS — R079 Chest pain, unspecified: Secondary | ICD-10-CM | POA: Diagnosis not present

## 2021-02-13 DIAGNOSIS — R319 Hematuria, unspecified: Secondary | ICD-10-CM | POA: Diagnosis present

## 2021-02-13 DIAGNOSIS — Z87891 Personal history of nicotine dependence: Secondary | ICD-10-CM | POA: Insufficient documentation

## 2021-02-13 DIAGNOSIS — M549 Dorsalgia, unspecified: Secondary | ICD-10-CM | POA: Diagnosis not present

## 2021-02-13 DIAGNOSIS — Z794 Long term (current) use of insulin: Secondary | ICD-10-CM | POA: Insufficient documentation

## 2021-02-13 DIAGNOSIS — Z91040 Latex allergy status: Secondary | ICD-10-CM | POA: Insufficient documentation

## 2021-02-13 DIAGNOSIS — R310 Gross hematuria: Secondary | ICD-10-CM

## 2021-02-13 DIAGNOSIS — M546 Pain in thoracic spine: Secondary | ICD-10-CM | POA: Insufficient documentation

## 2021-02-13 DIAGNOSIS — Z7984 Long term (current) use of oral hypoglycemic drugs: Secondary | ICD-10-CM | POA: Diagnosis not present

## 2021-02-13 DIAGNOSIS — E119 Type 2 diabetes mellitus without complications: Secondary | ICD-10-CM | POA: Insufficient documentation

## 2021-02-13 DIAGNOSIS — Z7901 Long term (current) use of anticoagulants: Secondary | ICD-10-CM | POA: Insufficient documentation

## 2021-02-13 DIAGNOSIS — R790 Abnormal level of blood mineral: Secondary | ICD-10-CM | POA: Diagnosis not present

## 2021-02-13 DIAGNOSIS — R791 Abnormal coagulation profile: Secondary | ICD-10-CM

## 2021-02-13 LAB — URINALYSIS, COMPLETE (UACMP) WITH MICROSCOPIC
Bacteria, UA: NONE SEEN
RBC / HPF: >50 RBC/hpf — ABNORMAL HIGH (ref 0–5)
Specific Gravity, Urine: 1.038 — ABNORMAL HIGH (ref 1.005–1.030)

## 2021-02-13 LAB — BASIC METABOLIC PANEL
Anion gap: 7 (ref 5–15)
BUN: 17 mg/dL (ref 6–20)
CO2: 29 mmol/L (ref 22–32)
Calcium: 9.7 mg/dL (ref 8.9–10.3)
Chloride: 105 mmol/L (ref 98–111)
Creatinine, Ser: 0.79 mg/dL (ref 0.44–1.00)
GFR, Estimated: >60 mL/min (ref >60)
Glucose, Bld: 136 mg/dL — ABNORMAL HIGH (ref 70–99)
Potassium: 4.6 mmol/L (ref 3.5–5.1)
Sodium: 141 mmol/L (ref 135–145)

## 2021-02-13 LAB — CBC WITH DIFFERENTIAL/PLATELET
Abs Immature Granulocytes: 0.02 10*3/uL (ref 0.00–0.07)
Basophils Absolute: 0 10*3/uL (ref 0.0–0.1)
Basophils Relative: 1 %
Eosinophils Absolute: 0.4 10*3/uL (ref 0.0–0.5)
Eosinophils Relative: 6 %
HCT: 42.4 % (ref 36.0–46.0)
Hemoglobin: 14 g/dL (ref 12.0–15.0)
Immature Granulocytes: 0 %
Lymphocytes Relative: 45 %
Lymphs Abs: 3.1 10*3/uL (ref 0.7–4.0)
MCH: 28.9 pg (ref 26.0–34.0)
MCHC: 33 g/dL (ref 30.0–36.0)
MCV: 87.6 fL (ref 80.0–100.0)
Monocytes Absolute: 0.4 10*3/uL (ref 0.1–1.0)
Monocytes Relative: 6 %
Neutro Abs: 2.9 10*3/uL (ref 1.7–7.7)
Neutrophils Relative %: 42 %
Platelets: 254 10*3/uL (ref 150–400)
RBC: 4.84 MIL/uL (ref 3.87–5.11)
RDW: 13.8 % (ref 11.5–15.5)
WBC: 6.9 10*3/uL (ref 4.0–10.5)
nRBC: 0 % (ref 0.0–0.2)

## 2021-02-13 LAB — PROTIME-INR
INR: 5.4 (ref 0.8–1.2)
INR: 5.4 — AB (ref 0.9–1.1)
Prothrombin Time: 49.1 seconds — ABNORMAL HIGH (ref 11.4–15.2)

## 2021-02-13 LAB — TYPE AND SCREEN
ABO/RH(D): O POS
Antibody Screen: NEGATIVE

## 2021-02-13 MED ORDER — ACETAMINOPHEN 500 MG PO TABS
1000.0000 mg | ORAL_TABLET | Freq: Once | ORAL | Status: AC
  Administered 2021-02-13: 1000 mg via ORAL
  Filled 2021-02-13: qty 2

## 2021-02-13 MED ORDER — LIDOCAINE 5 % EX PTCH
1.0000 | MEDICATED_PATCH | Freq: Once | CUTANEOUS | Status: DC
  Administered 2021-02-13: 1 via TRANSDERMAL
  Filled 2021-02-13: qty 1

## 2021-02-13 NOTE — ED Notes (Signed)
Patient transported to X-ray 
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
## 2021-02-13 NOTE — ED Triage Notes (Signed)
Pt has a clotting disorder and takes Coumadin and noticed yesterday that her urine was dark she thought that she was dehydrated. She woke up this am and noticed blood in her urine. She took her INR and it was 4.6 this am. She is in the care of Dr. Janese Banks the hematologist.

## 2021-02-15 LAB — PROTIME-INR: INR: 4.6 — AB (ref 0.9–1.1)

## 2021-02-17 ENCOUNTER — Ambulatory Visit (INDEPENDENT_AMBULATORY_CARE_PROVIDER_SITE_OTHER): Payer: Medicare Other | Admitting: Family Medicine

## 2021-02-17 ENCOUNTER — Other Ambulatory Visit: Payer: Self-pay

## 2021-02-17 ENCOUNTER — Encounter (INDEPENDENT_AMBULATORY_CARE_PROVIDER_SITE_OTHER): Payer: Self-pay | Admitting: Family Medicine

## 2021-02-17 VITALS — BP 93/62 | HR 92 | Temp 98.9°F | Ht 65.0 in | Wt 168.0 lb

## 2021-02-17 DIAGNOSIS — Z6838 Body mass index (BMI) 38.0-38.9, adult: Secondary | ICD-10-CM

## 2021-02-17 DIAGNOSIS — E1169 Type 2 diabetes mellitus with other specified complication: Secondary | ICD-10-CM | POA: Diagnosis not present

## 2021-02-18 ENCOUNTER — Other Ambulatory Visit: Payer: Self-pay | Admitting: Internal Medicine

## 2021-02-23 ENCOUNTER — Other Ambulatory Visit: Payer: Self-pay

## 2021-02-23 ENCOUNTER — Encounter (INDEPENDENT_AMBULATORY_CARE_PROVIDER_SITE_OTHER): Payer: Self-pay

## 2021-02-23 ENCOUNTER — Ambulatory Visit (INDEPENDENT_AMBULATORY_CARE_PROVIDER_SITE_OTHER): Payer: Medicare Other | Admitting: Family Medicine

## 2021-02-23 ENCOUNTER — Encounter: Payer: Self-pay | Admitting: Internal Medicine

## 2021-02-23 ENCOUNTER — Ambulatory Visit (INDEPENDENT_AMBULATORY_CARE_PROVIDER_SITE_OTHER): Payer: Medicare Other | Admitting: Internal Medicine

## 2021-02-23 ENCOUNTER — Encounter: Payer: Self-pay | Admitting: Oncology

## 2021-02-23 VITALS — BP 122/64 | HR 88 | Temp 98.1°F | Ht 65.0 in | Wt 173.8 lb

## 2021-02-23 DIAGNOSIS — E119 Type 2 diabetes mellitus without complications: Secondary | ICD-10-CM

## 2021-02-23 DIAGNOSIS — I700 Atherosclerosis of aorta: Secondary | ICD-10-CM | POA: Diagnosis not present

## 2021-02-23 DIAGNOSIS — R1084 Generalized abdominal pain: Secondary | ICD-10-CM | POA: Diagnosis not present

## 2021-02-23 DIAGNOSIS — N3289 Other specified disorders of bladder: Secondary | ICD-10-CM | POA: Diagnosis not present

## 2021-02-23 DIAGNOSIS — E2839 Other primary ovarian failure: Secondary | ICD-10-CM

## 2021-02-23 DIAGNOSIS — R319 Hematuria, unspecified: Secondary | ICD-10-CM | POA: Diagnosis not present

## 2021-02-23 DIAGNOSIS — M5136 Other intervertebral disc degeneration, lumbar region: Secondary | ICD-10-CM

## 2021-02-23 DIAGNOSIS — R935 Abnormal findings on diagnostic imaging of other abdominal regions, including retroperitoneum: Secondary | ICD-10-CM | POA: Diagnosis not present

## 2021-02-23 DIAGNOSIS — K5900 Constipation, unspecified: Secondary | ICD-10-CM | POA: Diagnosis not present

## 2021-02-23 LAB — PROTIME-INR: INR: 2.3 — AB (ref 0.9–1.1)
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
## 2021-02-23 NOTE — Patient Instructions (Signed)

## 2021-02-24 ENCOUNTER — Encounter (INDEPENDENT_AMBULATORY_CARE_PROVIDER_SITE_OTHER): Payer: Self-pay | Admitting: Family Medicine

## 2021-02-24 ENCOUNTER — Encounter: Payer: Self-pay | Admitting: Internal Medicine

## 2021-02-24 ENCOUNTER — Encounter: Payer: Self-pay | Admitting: Oncology

## 2021-02-24 LAB — URINE CULTURE
MICRO NUMBER:: 12081802
Result:: NO GROWTH
SPECIMEN QUALITY:: ADEQUATE

## 2021-02-24 LAB — URINALYSIS, ROUTINE W REFLEX MICROSCOPIC
Bilirubin Urine: NEGATIVE
Hgb urine dipstick: NEGATIVE
Ketones, ur: NEGATIVE
Leukocytes,Ua: NEGATIVE
Nitrite: NEGATIVE
Protein, ur: NEGATIVE
Specific Gravity, Urine: 1.034 (ref 1.001–1.035)
pH: <=5 (ref 5.0–8.0)

## 2021-02-24 NOTE — Addendum Note (Signed)
Addended by: Orland Mustard on: 02/24/2021 04:29 PM   Modules accepted: Orders

## 2021-02-24 NOTE — Progress Notes (Signed)
Chief Complaint:   OBESITY Jamy is here to discuss her progress with her obesity treatment plan along with follow-up of her obesity related diagnoses. Blessings is on keeping a food journal and adhering to recommended goals of 1500 calories and 90 grams of protein and states she is following her eating plan approximately 80-85% of the time. Michol states she is not exercising regularly.  Today's visit was #: 89 Starting weight: 231 lbs Starting date: 06/01/2020 Today's weight: 168 lbs Today's date: 02/17/2021 Total lbs lost to date: 63 lbs Total lbs lost since last in-office visit: 1 lb  Interim History: Jilleen has been in severe pain the past few weeks and has not been able to exercise much.  She has increased calories to 1400 per day.  She had been not getting in enough food. She is averaging 70-80 grams of protein per day.  She is mostly vegetarian.   Subjective:   1. Type 2 diabetes mellitus with other specified complication, without long-term current use of insulin ([name]) Well-controlled.  Last A1c was 6.4.  She is on Jardiance, metformin, and Ozempic.  Tolerating well, however, it is unaffordable ($200 per month).  Lab Results  Component Value Date   HGBA1C 6.4 (H) 10/21/2020   HGBA1C 8.6 (H) 06/01/2020   HGBA1C 7.3 (H) 01/21/2020   Lab Results  Component Value Date   MICROALBUR <0.2 01/23/2020   LDLCALC 41 10/21/2020   CREATININE 0.79 02/13/2021   Lab Results  Component Value Date   INSULIN 24.3 06/01/2020   Assessment/Plan:   1. Type 2 diabetes mellitus with other specified complication, without long-term current use of insulin ([place]) Provided alternatives for Ozempic and she will call her insurance to check on pricing of Trulicity and Victoza.  2. Obesity: Current BMI 27.96  Tericka is currently in the action stage of change. As such, her goal is to continue with weight loss efforts. She has agreed to keeping a food journal and adhering to recommended goals of 1500  calories and 90 grams of protein.   Exercise goals: All adults should avoid inactivity. Some physical activity is better than none, and adults who participate in any amount of physical activity gain some health benefits.  Behavioral modification strategies: increasing lean protein intake and better snacking choices.  Handout:  Protein Content of Foods.  Tinsley has agreed to follow-up with our clinic in 3 weeks.  Objective:   Blood pressure 93/62, pulse 92, temperature 98.9 F (37.2 C), height 5\' 5"  (1.651 m), weight 168 lb (76.2 kg), last menstrual period 03/05/2018, SpO2 95 %. Body mass index is 27.96 kg/m.  General: Cooperative, alert, well developed, in no acute distress. HEENT: Conjunctivae and lids unremarkable. Cardiovascular: Regular rhythm.  Lungs: Normal work of breathing. Neurologic: No focal deficits.   Lab Results  Component Value Date   CREATININE 0.79 02/13/2021   BUN 17 02/13/2021   NA 141 02/13/2021   K 4.6 02/13/2021   CL 105 02/13/2021   CO2 29 02/13/2021   Lab Results  Component Value Date   ALT 18 10/21/2020   AST 19 10/21/2020   ALKPHOS 72 10/21/2020   BILITOT 0.2 10/21/2020   Lab Results  Component Value Date   HGBA1C 6.4 (H) 10/21/2020   HGBA1C 8.6 (H) 06/01/2020   HGBA1C 7.3 (H) 01/21/2020   HGBA1C 6.7 (A) 02/18/2019   HGBA1C 7.8 (H) 09/19/2018   Lab Results  Component Value Date   INSULIN 24.3 06/01/2020   Lab Results  Component  Value Date   TSH 1.450 06/01/2020   Lab Results  Component Value Date   CHOL 113 10/21/2020   HDL 41 10/21/2020   LDLCALC 41 10/21/2020   LDLDIRECT 80.0 09/19/2018   TRIG 192 (H) 10/21/2020   CHOLHDL 2 01/21/2020   Lab Results  Component Value Date   VD25OH 78.2 10/21/2020   VD25OH 32.2 06/01/2020   VD25OH 32.97 09/19/2018   Lab Results  Component Value Date   WBC 6.9 02/13/2021   HGB 14.0 02/13/2021   HCT 42.4 02/13/2021   MCV 87.6 02/13/2021   PLT 254 02/13/2021   Lab Results  Component  Value Date   IRON 65 05/06/2010   Obesity Behavioral Intervention:   Approximately 15 minutes were spent on the discussion below.  ASK: We discussed the diagnosis of obesity with Allison today and Lavanda agreed to give [place] permission to discuss obesity behavioral modification therapy today.  ASSESS: Athziri has the diagnosis of obesity and her BMI today is 28.0. Robertta is in the action stage of change.   ADVISE: Ethelle was educated on the multiple health risks of obesity as well as the benefit of weight loss to improve her health. She was advised of the need for long term treatment and the importance of lifestyle modifications to improve her current health and to decrease her risk of future health problems.  AGREE: Multiple dietary modification options and treatment options were discussed and Miangel agreed to follow the recommendations documented in the above note.  ARRANGE: Kaliyah was educated on the importance of frequent visits to treat obesity as outlined per CMS and USPSTF guidelines and agreed to schedule her next follow up appointment today.  Attestation Statements:   Reviewed by clinician on day of visit: allergies, medications, problem list, medical history, surgical history, family history, social history, and previous encounter notes.  I, Water quality scientist, CMA, am acting as Location manager for [employer], [place].  I have reviewed the above documentation for accuracy and completeness, and I agree with the above. -  Georgianne Fick, FNP

## 2021-02-24 NOTE — Telephone Encounter (Signed)
Please advise 

## 2021-02-25 ENCOUNTER — Telehealth: Payer: Self-pay | Admitting: Internal Medicine

## 2021-02-25 ENCOUNTER — Ambulatory Visit: Payer: Medicare Other | Admitting: Psychology

## 2021-02-25 NOTE — Telephone Encounter (Signed)
Pt called returning your call 

## 2021-02-25 NOTE — Telephone Encounter (Signed)
Lft pt vm to call ofc to sch CT abdomen.thanks

## 2021-03-01 ENCOUNTER — Telehealth: Payer: Self-pay | Admitting: Internal Medicine

## 2021-03-01 DIAGNOSIS — E1169 Type 2 diabetes mellitus with other specified complication: Secondary | ICD-10-CM

## 2021-03-01 DIAGNOSIS — Z79899 Other long term (current) drug therapy: Secondary | ICD-10-CM

## 2021-03-01 NOTE — Telephone Encounter (Signed)
Patient called and said she has fallen into the donut hole for her JARDIANCE 25 MG TABS tablet .  The cost to her will be $451 for 30 day supply. She aslo will have the same issue when it come to refilling her Semaglutide,0.25 or 0.5MG /DOS, (OZEMPIC, 0.25 OR 0.5 MG/DOSE,) 2 MG/1.5ML SOPN. Patient would like to know of a diffenent alternative (more affordable)

## 2021-03-02 DIAGNOSIS — Z7901 Long term (current) use of anticoagulants: Secondary | ICD-10-CM | POA: Diagnosis not present

## 2021-03-02 DIAGNOSIS — D6861 Antiphospholipid syndrome: Secondary | ICD-10-CM | POA: Diagnosis not present

## 2021-03-02 LAB — PROTIME-INR: INR: 3.5 — AB (ref 0.9–1.1)

## 2021-03-02 NOTE — Telephone Encounter (Signed)
She follows with endocrine and needs to call them for this  Or  If wants referral to catie our pharmacist in the office to help

## 2021-03-02 NOTE — Telephone Encounter (Signed)
Amanda Vasquez, she was lost for follow-up for me.  Can you please make sure that she would want to return to see me.  If so, we can schedule after I return from vacation. Amanda Vasquez, if she plans to return to see me, can we help her with preauthorizations with these 2 medications?  I am not sure if PA's will help in the donut hole.  Patient assistance may be in other options. Thank you, both! C

## 2021-03-03 ENCOUNTER — Encounter: Payer: Self-pay | Admitting: Oncology

## 2021-03-03 ENCOUNTER — Encounter: Payer: Self-pay | Admitting: *Deleted

## 2021-03-03 NOTE — Telephone Encounter (Signed)
Called and spoke with pt, she would prefer her PCP's office assist with this.

## 2021-03-04 DIAGNOSIS — Z79891 Long term (current) use of opiate analgesic: Secondary | ICD-10-CM | POA: Diagnosis not present

## 2021-03-04 DIAGNOSIS — G894 Chronic pain syndrome: Secondary | ICD-10-CM | POA: Diagnosis not present

## 2021-03-04 DIAGNOSIS — G890 Central pain syndrome: Secondary | ICD-10-CM | POA: Diagnosis not present

## 2021-03-04 DIAGNOSIS — M62830 Muscle spasm of back: Secondary | ICD-10-CM | POA: Diagnosis not present

## 2021-03-04 NOTE — Addendum Note (Signed)
Addended by: Orland Mustard on: 03/04/2021 03:07 PM   Modules accepted: Orders

## 2021-03-04 NOTE — Telephone Encounter (Signed)
Patient called and said she has fallen into the donut hole for her JARDIANCE 25 MG TABS tablet .  The cost to her will be $451 for 30 day supply. She aslo will have the same issue when it come to refilling her Semaglutide,0.25 or 0.5MG /DOS, (OZEMPIC, 0.25 OR 0.5 MG/DOSE,) 2 MG/1.5ML SOPN. Patient would like to know of a diffenent alternative (more affordable)  Can you help?

## 2021-03-04 NOTE — Telephone Encounter (Addendum)
I would need a new CCM referral since patient was previously lost to follow up.  In the meantime, we can give samples of Jardiance and I have a free 30 day supply card (that can be used with Medicare) for Ozempic

## 2021-03-05 NOTE — Telephone Encounter (Signed)
CCM referral created by Dr Olivia Mackie McLean-Scocuzza 03/04/21.   Patient would like to come in to pick up medication and coupon card. Will place this up front once put together

## 2021-03-09 LAB — PROTIME-INR: INR: 2.1 — AB (ref 0.9–1.1)

## 2021-03-10 ENCOUNTER — Encounter: Payer: Self-pay | Admitting: Oncology

## 2021-03-11 ENCOUNTER — Ambulatory Visit
Admission: RE | Admit: 2021-03-11 | Discharge: 2021-03-11 | Disposition: A | Discharge status: Home / Self Care | Payer: Medicare Other | Source: Ambulatory Visit | Attending: Internal Medicine | Admitting: Internal Medicine

## 2021-03-11 ENCOUNTER — Telehealth: Payer: Self-pay

## 2021-03-11 ENCOUNTER — Other Ambulatory Visit: Payer: Self-pay

## 2021-03-11 ENCOUNTER — Encounter: Payer: Self-pay | Admitting: *Deleted

## 2021-03-11 DIAGNOSIS — R1084 Generalized abdominal pain: Secondary | ICD-10-CM | POA: Diagnosis not present

## 2021-03-11 DIAGNOSIS — I700 Atherosclerosis of aorta: Secondary | ICD-10-CM | POA: Diagnosis not present

## 2021-03-11 DIAGNOSIS — R601 Generalized edema: Secondary | ICD-10-CM | POA: Diagnosis not present

## 2021-03-11 DIAGNOSIS — R319 Hematuria, unspecified: Secondary | ICD-10-CM | POA: Diagnosis not present

## 2021-03-11 DIAGNOSIS — K429 Umbilical hernia without obstruction or gangrene: Secondary | ICD-10-CM | POA: Diagnosis not present

## 2021-03-11 NOTE — Chronic Care Management (AMB) (Signed)
  Chronic Care Management   Note  03/11/2021 Name: Amanda Vasquez MRN: [id number] DOB: 1971-08-07  Amanda Vasquez is a 50 y.o. year old female who is a primary care patient of McLean-Scocuzza, Nino Glow, MD. Amanda Vasquez is currently enrolled in care management services. An additional referral for Pharm D  was placed.   Follow up plan: Unsuccessful telephone outreach attempt made. A HIPAA compliant phone message was left for the patient providing contact information and requesting a return call.  The care management team will reach out to the patient again over the next 7 days.  If patient returns call to provider office, please advise to call [facility]  at [address], [place], [place], [place] [address] Direct Dial: [phone] [email or web address]: [email or web address]

## 2021-03-12 ENCOUNTER — Encounter: Payer: Self-pay | Admitting: Internal Medicine

## 2021-03-12 DIAGNOSIS — I700 Atherosclerosis of aorta: Secondary | ICD-10-CM | POA: Insufficient documentation

## 2021-03-12 LAB — PROTIME-INR: INR: 2.2 — AB (ref 0.9–1.1)

## 2021-03-15 ENCOUNTER — Ambulatory Visit (INDEPENDENT_AMBULATORY_CARE_PROVIDER_SITE_OTHER): Payer: Medicare Other | Admitting: Family Medicine

## 2021-03-15 ENCOUNTER — Encounter: Payer: Self-pay | Admitting: Oncology

## 2021-03-16 ENCOUNTER — Ambulatory Visit: Payer: Medicare Other | Admitting: Neurology

## 2021-03-16 DIAGNOSIS — G43711 Chronic migraine without aura, intractable, with status migrainosus: Secondary | ICD-10-CM | POA: Diagnosis not present

## 2021-03-16 LAB — PROTIME-INR: INR: 2.9 — AB (ref 0.9–1.1)

## 2021-03-16 NOTE — Progress Notes (Signed)
Consent Form Botulism Toxin Injection For Chronic Migraine  Interval history 03/16/2021: Stable Interval history December 08, 2020: Patient is feeling a little under the weather today, however she still continues to have excellent response with her Botox, she is lost 70 pounds and she feels this is really helped her knees, [facility], she recently came off her warfarin in order to get a colonoscopy unfortunately now she has to bridge with Lovenox and she is very bruised and feeling a little under the weather especially given the rain which worsens her left-sided sensory loss and pain. Interval history 09/09/2020: Continues excellent response, > 75% decrease frequency headaches and migraines. +a. Her 8 year old dog had pancreatitis, he is on insulin now,she has a new puppy about 74 months old, and still no worsening of migraines.   Interval history 04/21/2020: Continues to do extremely well > 75% decrease freq headaches. +a  Reviewed orally with patient, additionally signature is on file:  Botulism toxin has been approved by the Federal drug administration for treatment of chronic migraine. Botulism toxin does not cure chronic migraine and it may not be effective in some patients.  The administration of botulism toxin is accomplished by injecting a small amount of toxin into the muscles of the neck and head. Dosage must be titrated for each individual. Any benefits resulting from botulism toxin tend to wear off after 3 months with a repeat injection required if benefit is to be maintained. Injections are usually done every 3-4 months with maximum effect peak achieved by about 2 or 3 weeks. Botulism toxin is expensive and you should be sure of what costs you will incur resulting from the injection.  The side effects of botulism toxin use for chronic migraine may include:   -Transient, and usually mild, facial weakness with facial injections  -Transient, and  usually mild, head or neck weakness with head/neck injections  -Reduction or loss of forehead facial animation due to forehead muscle weakness  -Eyelid drooping  -Dry eye  -Pain at the site of injection or bruising at the site of injection  -Double vision  -Potential unknown long term risks  Contraindications: You should not have Botox if you are pregnant, nursing, allergic to albumin, have an infection, skin condition, or muscle weakness at the site of the injection, or have myasthenia gravis, Lambert-Eaton syndrome, or ALS.  It is also possible that as with any injection, there may be an allergic reaction or no effect from the medication. Reduced effectiveness after repeated injections is sometimes seen and rarely infection at the injection site may occur. All care will be taken to prevent these side effects. If therapy is given over a long time, atrophy and wasting in the muscle injected may occur. Occasionally the patient's become refractory to treatment because they develop antibodies to the toxin. In this event, therapy needs to be modified.  I have read the above information and consent to the administration of botulism toxin.    BOTOX PROCEDURE NOTE FOR MIGRAINE HEADACHE    Contraindications and precautions discussed with patient(above). Aseptic procedure was observed and patient tolerated procedure. Procedure performed by Dr. Georgia Dom  The condition has existed for more than 6 months, and pt does not have a diagnosis of ALS, Myasthenia Gravis or Lambert-Eaton Syndrome.  Risks and benefits of injections discussed and pt agrees to proceed with the procedure.  Written consent obtained  These injections are medically necessary. Pt  receives good benefits from  these injections. These injections do not cause sedations or hallucinations which the oral therapies may cause.  Description of procedure:  The patient was placed in a sitting position. The standard protocol was used for Botox  as follows, with 5 units of Botox injected at each site:   -Procerus muscle, midline injection  -Corrugator muscle, bilateral injection  -Frontalis muscle, bilateral injection, with 2 sites each side, medial injection was performed in the upper one third of the frontalis muscle, in the region vertical from the medial inferior edge of the superior orbital rim. The lateral injection was again in the upper one third of the forehead vertically above the lateral limbus of the cornea, 1.5 cm lateral to the medial injection site.    -Temporalis muscle injection, 5 sites, bilaterally. The first injection was 3 cm above the tragus of the ear, second injection site was 1.5 cm to 3 cm up from the first injection site in line with the tragus of the ear. The third injection site was 1.5-3 cm forward between the first 2 injection sites. The fourth injection site was 1.5 cm posterior to the second injection site.   -Occipitalis muscle injection, 3 sites, bilaterally. The first injection was done one half way between the occipital protuberance and the tip of the mastoid process behind the ear. The second injection site was done lateral and superior to the first, 1 fingerbreadth from the first injection. The third injection site was 1 fingerbreadth superiorly and medially from the first injection site.  -Cervical paraspinal muscle injection, 2 sites, bilateral knee first injection site was 1 cm from the midline of the cervical spine, 3 cm inferior to the lower border of the occipital protuberance. The second injection site was 1.5 cm superiorly and laterally to the first injection site.  -Trapezius muscle injection was performed at 3 sites, bilaterally. The first injection site was in the upper trapezius muscle halfway between the inflection point of the neck, and the acromion. The second injection site was one half way between the acromion and the first injection site. The third injection was done between the first  injection site and the inflection point of the neck.   Will return for repeat injection in 3 months.   A 200 unit sof Botox was used, any Botox not injected was wasted. The patient tolerated the procedure well, there were no complications of the above procedure.  Performed by Dr. Jaynee Eagles M.D. 50m Lidocaine 1%, 30-gauge needle was used. All procedures a documented were medically necessary, reasonable and appropriate based on the patient's history, medical diagnosis and physician opinion. Verbal informed consent was obtained from the patient, patient was informed of potential risk of procedure, including bruising, bleeding, hematoma formation, infection, muscle weakness, muscle pain, numbness, transient hypertension, transient hyperglycemia and transient insomnia among others. All areas injected were topically clean with isopropyl rubbing alcohol. Nonsterile nonlatex gloves were worn during the procedure.  Left pectoralis 3 injection sites based on palpation and pain. Used emg machine to target the muscle. Patient tolerated well no side effects. Felt better after procedure. Called her this evening, no side effects.

## 2021-03-16 NOTE — Chronic Care Management (AMB) (Signed)
  Chronic Care Management   Note  03/16/2021 Name: Amanda Vasquez MRN: [id number] DOB: 20-Dec-1970  Amanda Vasquez is a 50 y.o. year old female who is a primary care patient of McLean-Scocuzza, Nino Glow, MD. Amanda Vasquez is currently enrolled in care management services. An additional referral for Pharm D  was placed.   Follow up plan: Unsuccessful telephone outreach attempt made.  Unable to make contact on outreach attempts x 3. PCP McLean-Scocuzza, Nino Glow, MD notified via routed documentation in medical record.   Noreene Larsson, [place], [place], [place] [address] Direct Dial: [phone] [email or web address]: [email or web address]

## 2021-03-16 NOTE — Progress Notes (Signed)
Botox- 200 units x 1 vial Lot: [id number] Expiration: 08/2023 NDC: [id number]  Bacteriostatic 0.9% Sodium Chloride- 58m total Lot: [id number]: 08/22/2022 NDC: [id number] Dx: [id number]

## 2021-03-17 ENCOUNTER — Encounter: Payer: Self-pay | Admitting: Oncology

## 2021-03-21 NOTE — Telephone Encounter (Signed)
Amanda Vasquez, Did pt pick up jardiance 25 mg samples and ozempic (?dose she is on as endocrine manages)  Also 03/11/21 3x failed attempts to reach out to pt to help with cost of meds and asst with coupon card Can you all try again catie and amber?

## 2021-03-22 DIAGNOSIS — K5900 Constipation, unspecified: Secondary | ICD-10-CM | POA: Insufficient documentation

## 2021-03-22 DIAGNOSIS — E2839 Other primary ovarian failure: Secondary | ICD-10-CM | POA: Insufficient documentation

## 2021-03-22 DIAGNOSIS — M5136 Other intervertebral disc degeneration, lumbar region: Secondary | ICD-10-CM | POA: Insufficient documentation

## 2021-03-22 NOTE — Addendum Note (Signed)
Addended by: Orland Mustard on: 03/22/2021 09:24 PM   Modules accepted: Orders

## 2021-03-22 NOTE — Telephone Encounter (Signed)
Tried again today and reached patient, she is scheduled 04/06/2021

## 2021-03-22 NOTE — Telephone Encounter (Signed)
Medication was placed upfront and Patient informed 2 weeks ago. Patient has come to pick up medication as this is no longer upfront and not returned to me for failed pick up. Has been picked up by Patient

## 2021-03-22 NOTE — Chronic Care Management (AMB) (Signed)
  Chronic Care Management   Note  03/22/2021 Name: MALAK BEANER MRN: [id number] DOB: 1971/01/11  CANNA SWADLEY is a 50 y.o. year old female who is a primary care patient of McLean-Scocuzza, Nino Glow, MD. INGRIS GENARO is currently enrolled in care management services. An additional referral for Pharm D was placed.   Follow up plan: Telephone appointment with care management team member scheduled for:04/06/2021  Noreene Larsson, [place], [place], [place] [address] Direct Dial: [phone] [email or web address]: [email or web address]

## 2021-03-23 DIAGNOSIS — N3289 Other specified disorders of bladder: Secondary | ICD-10-CM | POA: Insufficient documentation

## 2021-03-23 LAB — PROTIME-INR: INR: 3.2 — AB (ref 0.9–1.1)

## 2021-03-23 NOTE — Addendum Note (Signed)
Addended by: Orland Mustard on: 03/23/2021 12:14 AM   Modules accepted: Orders

## 2021-03-24 ENCOUNTER — Encounter: Payer: Self-pay | Admitting: *Deleted

## 2021-03-24 ENCOUNTER — Encounter: Payer: Self-pay | Admitting: Oncology

## 2021-03-30 DIAGNOSIS — D6861 Antiphospholipid syndrome: Secondary | ICD-10-CM | POA: Diagnosis not present

## 2021-03-30 DIAGNOSIS — Z7901 Long term (current) use of anticoagulants: Secondary | ICD-10-CM | POA: Diagnosis not present

## 2021-04-01 ENCOUNTER — Encounter: Payer: Self-pay | Admitting: Oncology

## 2021-04-01 ENCOUNTER — Encounter: Payer: Self-pay | Admitting: *Deleted

## 2021-04-01 LAB — PROTIME-INR: INR: 3.6 — AB (ref 0.9–1.1)

## 2021-04-06 ENCOUNTER — Encounter: Payer: Self-pay | Admitting: Oncology

## 2021-04-06 ENCOUNTER — Ambulatory Visit (INDEPENDENT_AMBULATORY_CARE_PROVIDER_SITE_OTHER): Payer: Medicare Other | Admitting: Family Medicine

## 2021-04-06 ENCOUNTER — Ambulatory Visit (INDEPENDENT_AMBULATORY_CARE_PROVIDER_SITE_OTHER): Payer: Medicare Other | Admitting: Pharmacist

## 2021-04-06 ENCOUNTER — Encounter: Payer: Self-pay | Admitting: *Deleted

## 2021-04-06 DIAGNOSIS — G43711 Chronic migraine without aura, intractable, with status migrainosus: Secondary | ICD-10-CM

## 2021-04-06 DIAGNOSIS — I700 Atherosclerosis of aorta: Secondary | ICD-10-CM

## 2021-04-06 DIAGNOSIS — E785 Hyperlipidemia, unspecified: Secondary | ICD-10-CM | POA: Diagnosis not present

## 2021-04-06 DIAGNOSIS — E1169 Type 2 diabetes mellitus with other specified complication: Secondary | ICD-10-CM | POA: Diagnosis not present

## 2021-04-06 LAB — PROTIME-INR: INR: 2.5 — AB (ref 0.9–1.1)

## 2021-04-06 NOTE — Chronic Care Management (AMB) (Signed)
Chronic Care Management Pharmacy Note  04/06/2021 Name:  Amanda Vasquez MRN:  [id number] DOB:  1971/06/09   Subjective: Amanda Vasquez is an 50 y.o. year old female who is a primary patient of McLean-Scocuzza, Nino Glow, MD.  Collaborating with covering provider Tommi Rumps, MD. The CCM team was consulted for assistance with disease management and care coordination needs.    Engaged with patient by telephone for follow up visit in response to provider referral for pharmacy case management and/or care coordination services.   Consent to Services:  The patient was given information about Chronic Care Management services, agreed to services, and gave verbal consent prior to initiation of services.  Please see initial visit note for detailed documentation.   Patient Care Team: McLean-Scocuzza, Nino Glow, MD as PCP - General (Internal Medicine) Marcial Pacas, MD as Consulting Physician (Neurology) Jovita Gamma, MD as Consulting Physician (Neurosurgery) Roseanne Kaufman, MD as Consulting Physician (Orthopedic Surgery) Nicholaus Bloom, MD (Anesthesiology) Pedro Earls, MD (Sports Medicine) Marjie Skiff, MD (Psychiatry) Saffo, Delcie Roch, MD (Psychology) De Hollingshead, RPH-CPP (Pharmacist)   Objective:  Lab Results  Component Value Date   CREATININE 0.79 02/13/2021   CREATININE 0.71 10/21/2020   CREATININE 0.63 06/01/2020    Lab Results  Component Value Date   HGBA1C 6.4 (H) 10/21/2020   Last diabetic Eye exam:  Lab Results  Component Value Date/Time   HMDIABEYEEXA No Retinopathy 01/27/2021 12:00 AM    Last diabetic Foot exam:  Lab Results  Component Value Date/Time   HMDIABFOOTEX Yes 12/21/2009 12:00 AM        Component Value Date/Time   CHOL 113 10/21/2020 1533   TRIG 192 (H) 10/21/2020 1533   HDL 41 10/21/2020 1533   CHOLHDL 2 01/21/2020 1144   VLDL 39.2 01/21/2020 1144   LDLCALC 41 10/21/2020 1533   LDLDIRECT 80.0 09/19/2018 1139    Hepatic  Function Latest Ref Rng & Units 10/21/2020 06/01/2020 01/21/2020  Total Protein 6.0 - 8.5 g/dL 6.8 6.5 6.7  Albumin 3.8 - 4.8 g/dL 4.5 3.8 4.3  AST 0 - 40 IU/L 19 20 46(H)  ALT 0 - 32 IU/L 18 17 51(H)  Alk Phosphatase 44 - 121 IU/L 72 72 59  Total Bilirubin 0.0 - 1.2 mg/dL 0.2 <0.2 0.3  Bilirubin, Direct 0.0 - 0.3 mg/dL - - -    Lab Results  Component Value Date/Time   TSH 1.450 06/01/2020 10:10 AM   TSH 1.53 01/21/2020 11:44 AM   FREET4 0.77 01/20/2015 02:12 PM   FREET4 0.76 05/06/2010 03:16 PM    CBC Latest Ref Rng & Units 02/13/2021 06/01/2020 01/21/2020  WBC 4.0 - 10.5 K/uL 6.9 5.4 5.3  Hemoglobin 12.0 - 15.0 g/dL 14.0 11.8 13.3  Hematocrit 36.0 - 46.0 % 42.4 37.0 40.2  Platelets 150 - 400 K/uL 254 269 244.0    Lab Results  Component Value Date/Time   VD25OH 78.2 10/21/2020 03:33 PM   VD25OH 32.2 06/01/2020 10:10 AM   VD25OH 32.97 09/19/2018 11:39 AM   VD25OH 46.22 03/10/2016 11:41 AM    Clinical ASCVD: No  The ASCVD Risk score Mikey Bussing DC Jr., et al., 2013) failed to calculate for the following reasons:   The valid total cholesterol range is 130 to 320 mg/dL      Social History   Tobacco Use  Smoking Status Former   Packs/day: 0.25   Years: 20.00   Pack years: 5.00   Types: Cigarettes   Start date: 06/08/1986  Quit date: 08/22/2010   Years since quitting: 10.6  Smokeless Tobacco Never   BP Readings from Last 3 Encounters:  02/23/21 122/64  02/17/21 93/62  02/13/21 123/83   Pulse Readings from Last 3 Encounters:  02/23/21 88  02/17/21 92  02/13/21 85   Wt Readings from Last 3 Encounters:  02/23/21 173 lb 12.8 oz (78.8 kg)  02/17/21 168 lb (76.2 kg)  02/13/21 168 lb (76.2 kg)    Assessment: Review of patient past medical history, allergies, medications, health status, including review of consultants reports, laboratory and other test data, was performed as part of comprehensive evaluation and provision of chronic care management services.   SDOH:  (Social  Determinants of Health) assessments and interventions performed:  SDOH Interventions    Flowsheet Row Most Recent Value  SDOH Interventions   Financial Strain Interventions Other (Comment)  [manufacturer assistance]       CCM Care Plan  Allergies  Allergen Reactions   Latex Hives    Medications Reviewed Today     Reviewed by De Hollingshead, RPH-CPP (Pharmacist) on 04/06/21 at 1540  Med List Status: <None>   Medication Order Taking? Sig Documenting Provider Last Dose Status Informant  ALPRAZolam (XANAX) 0.5 MG tablet [id number] Yes TAKE 1 TABLET BY MOUTH DAILY AS NEEDED FOR ANXIETY McLean-Scocuzza, Nino Glow, MD Taking Active            Med Note De Hollingshead   Tue Apr 06, 2021  3:32 PM) 0-1 doses daily  atorvastatin (LIPITOR) 40 MG tablet [id number] Yes Take 1 tablet (40 mg total) by mouth daily at 6 PM. McLean-Scocuzza, Nino Glow, MD Taking Active   baclofen (LIORESAL) 10 MG tablet [id number] Yes Take 10 mg by mouth 2 (two) times daily.  Rowe Clack, MD Taking Active Self           Med Note De Hollingshead   Tue Apr 06, 2021  3:33 PM)    Botulinum Toxin Type A (BOTOX) 200 units SOLR [id number] Yes Provider to inject 155 units into the muscles of the head and neck every 3 months. Discard remainder. Melvenia Beam, MD Taking Active   buPROPion (WELLBUTRIN XL) 300 MG 24 hr tablet [id number] Yes TAKE ONE TABLET BY MOUTH EVERY DAY McLean-Scocuzza, Nino Glow, MD Taking Active   Cetirizine HCl 10 MG CAPS [id number] Yes Take 1 capsule (10 mg total) by mouth daily as needed. Take by mouth. Rowe Clack, MD Taking Active Self  Galcanezumab-gnlm [facility]) 120 MG/ML Darden Palmer [id number] Yes Inject 120 mg into the skin every 30 (thirty) days. [provider] Taking Active   GRALISE 600 MG TABS [id number] Yes Take 1,800 mg by mouth every evening.  [provider] Taking Active Self           Med Note Tawni Carnes Oct 02, 2017  7:28 PM)    JARDIANCE 25  MG TABS tablet [id number] Yes TAKE ONE TABLET BY MOUTH EVERY DAY Elayne Snare, MD Taking Active   metFORMIN (GLUCOPHAGE) 1000 MG tablet [id number] Yes Take 1 tablet (1,000 mg total) by mouth 2 (two) times daily with a meal. McLean-Scocuzza, Nino Glow, MD Taking Active   morphine (MS CONTIN) 60 MG 12 hr tablet [id number] Yes Take 60 mg by mouth every 12 (twelve) hours. [provider] Taking Active Self  naloxegol oxalate (MOVANTIK) 25 MG TABS tablet [id number] Yes Take by mouth daily. [provider] Taking Active   oxyCODONE (OXY IR/ROXICODONE) 5 MG  immediate release tablet [id number] Yes Take 1 tablet (5 mg total) by mouth every 4 (four) hours as needed for severe pain. Per pain clinic Dr Wylie Hail, Jannifer Rodney, MD Taking Active Self           Med Note ([place] Apr 06, 2021  3:36 PM) 3-5 doses daily  Polyethylene Glycol 3350 (MIRALAX PO) [id number] Yes Take by mouth as needed. [provider] Taking Active Self  promethazine (PHENERGAN) 25 MG tablet [id number] Yes Take 1 tablet (25 mg total) by mouth 2 (two) times daily as needed for nausea or vomiting. McLean-Scocuzza, Nino Glow, MD Taking Active   propranolol ER (INDERAL LA) 80 MG 24 hr capsule [id number] Yes Take 1 capsule (80 mg total) by mouth daily. McLean-Scocuzza, Nino Glow, MD Taking Active   Rimegepant Sulfate (NURTEC) 75 MG TBDP [id number] Yes Take 75 mg by mouth daily as needed. For migraines. Take as close to onset of migraine as possible. One daily maximum. Melvenia Beam, MD Taking Active   Semaglutide,0.25 or 0.5MG/DOS, (OZEMPIC, 0.25 OR 0.5 MG/DOSE,) 2 MG/1.5ML SOPN [id number] No Inject 0.5 mg into the skin once a week.  Patient not taking: Reported on 04/06/2021   [place], Joneen Boers, FNP Not Taking Active   sertraline (ZOLOFT) 100 MG tablet [id number] Yes Take 1.5 tablets (150 mg total) by mouth daily. McLean-Scocuzza, Nino Glow, MD Taking Active   traZODone (DESYREL) 100 MG tablet [id number] Yes Take 1  tablet (100 mg total) by mouth at bedtime as needed. for sleep McLean-Scocuzza, Nino Glow, MD Taking Active   warfarin (COUMADIN) 10 MG tablet [id number] Yes Take 10 mg by mouth daily. [provider] Taking Active             Patient Active Problem List   Diagnosis Date Noted   Bladder wall thickening 03/23/2021   DDD (degenerative disc disease), lumbar 03/22/2021   Constipation 03/22/2021   Estrogen deficiency 03/22/2021   Aortic atherosclerosis ([place]) 03/12/2021   Chronic insomnia 01/20/2021   Vitamin D deficiency 10/21/2020   Hyperlipidemia associated with type 2 diabetes mellitus ([place]) 06/05/2020   Arthritis of right shoulder region 06/04/2020   Acute meniscal tear of left knee    Right shoulder pain 05/11/2020   Chronic pain of right knee 01/21/2020   Hypotension 01/21/2020   Right knee meniscal tear 08/01/2019   Anti-phospholipid antibody syndrome ([place]) 03/22/2019   Indigestion 07/27/2018   Chronic migraine without aura, with intractable migraine, so stated, with status migrainosus 01/29/2018   Cervicalgia 01/12/2018   Acute pain of left shoulder 01/12/2018   Acute bilateral low back pain without sciatica 01/12/2018   Muscle strain 01/12/2018   Breast pain in female 10/19/2017   Vaginal bleeding 10/19/2017   Pulmonary emboli ([place]) 10/02/2017   Migraine headache 05/17/2017   Anxiety and depression 01/30/2017   Dot and blot hemorrhage, left 01/19/2017   Gastroparesis 06/22/2016   Fatigue 03/10/2016   Pre-operative cardiovascular examination 09/21/2015   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   CTS (carpal tunnel syndrome) 02/25/2015   Paresthesias 02/02/2015   Demyelinating nervous system disease or syndrome ([place]) 01/20/2015   Demyelinating disease of central nervous system ([place]) 03/01/2012   Thalamic pain syndrome (hyperesthetic) 09/07/2010   Hyperlipidemia 05/28/2010   Herpes simplex virus (HSV) infection 12/21/2009   ALLERGIC RHINITIS 12/21/2009   BACK PAIN,  THORACIC REGION 11/11/2009   Diabetes mellitus ([place]) 07/28/2008   AMENORRHEA 12/17/2007   DISORDER, BIPOLAR, ATYPICAL MANIC 05/14/2007    Immunization  History  Administered Date(s) Administered   Influenza Split 06/22/2011   Influenza,inj,Quad PF,6+ Mos 04/26/2013, 05/22/2014, 05/17/2017, 05/31/2018, 05/13/2019   Influenza-Unspecified 07/07/2015, 05/06/2020, 05/07/2020   Moderna SARS-COV2 Booster Vaccination 12/01/2020   Moderna Sars-Covid-2 Vaccination 11/01/2019, 12/03/2019, 04/20/2020   Pneumococcal Polysaccharide-23 09/12/2012, 10/03/2017   Td 06/07/2006   Tdap 10/26/2018, 10/26/2018   Zoster Recombinat (Shingrix) 03/10/2021    Conditions to be addressed/monitored: HLD, DMII, Anxiety, and Depression  Care Plan : Medication Management  Updates made by De Hollingshead, RPH-CPP since 04/06/2021 12:00 AM     Problem: Diabetes, Obesity, Chronic Pain, APL hx VTE      Long-Range Goal: Disease Progression Prevention   Start Date: 09/22/2020  This Visit's Progress: On track  Recent Progress: On track  Priority: High  Note:   Current Barriers:  Unable to independently afford treatment regimen Complex patient with multiple comorbidities that increase risk of hospitalization, including depression and anxiety, chronic pain, migraines, periodic nausea and vomiting, gastroparesis, antiphospholipid antibody  Pharmacist Clinical Goal(s):  Over the next 90 days, patient will achieve adherence to monitoring guidelines and medication adherence to achieve therapeutic efficacy through collaboration with PharmD and provider.   Interventions: 1:1 collaboration with McLean-Scocuzza, Nino Glow, MD regarding development and update of comprehensive plan of care as evidenced by provider attestation and co-signature Inter-disciplinary care team collaboration (see longitudinal plan of care) Comprehensive medication review performed; medication list updated in electronic medical  record  Diabetes/Obesity: Controlled; current treatment: metformin 1000 mg BID, Jardiance 25 mg daily, Ozempic 0.5 mg weekly; follows  Weight Management Reports she has been out of Ozempic for a few weeks due to cost. Will offer sample.  Reports that her most recent income should qualify for assistance for Jardiance and Ozempic. Will message Weight Management to see if they would like for application to be for Ozempic 0.5 mg or 1 mg. Will collaborate with PCP's covering provider (as Ozempic will ship to the prescriber's office) and CPhT for submission and follow up  Hyperlipidemia and ASCVD risk reduction: Controlled per last lipid panel; current treatment: atorvastatin 40 mg daily Recommended to continue current regimen at this time  Depression/Anxiety: Controlled per patient report; current treatment: alprazolam 0.5 mg daily PRN (reports infrequent use, but is unable to specify how often she needs) bupropion XL 300 mg daily, sertraline 150 mg daily, trazodone 150 mg QPM Recommended to continue current regimen at this time  Chronic Pain: Managed per Dr. Eartha [facility] in [place]; morphine CR 60 mg BID, oxycodone 5 mg Q4H PRN, baclofen 10 mg BID, gabapentin ER (Gralise) 600 mg daily Reports Gralise is expensive. No patient assistance available at this time or foundation funds. She notes she will stretch this supply purposely due to cost.   Recommend to continue current regimen at this time along with collaboration with pain management  Antiphospholipid antibody syndrome: Appropriately managed; current treatment: warfarin 10 mg; managed by hem/onc Dr. Janese Banks Home INR monitor, communicates w/ team via [place].  Continue current regimen at this time along with collaboration with hematology.   Chronic Migraines: Controlled per patient report; current treatment: Emgality 120 mg monthly , Botox Q3 months, Nurtec 75 mg PRN; propranolol 80 mg daily; follows w/ Dr Jaynee Eagles Reviewed income and  [employer]. Appears there is a program for Botox. Patient's income should qualify for Emgality and Botox patient assistance. Will collaborate with patient, neurology, and CPhT for submission and follow up Follow for re-opening of [place] Migraine fund for assistance with copay of other  medications.    Patient Goals/Self-Care Activities Over the next 90 days, patient will:  - take medications as prescribed check glucose daily, document, and provide at future appointments collaborate with provider on medication access solutions target a minimum of 150 minutes of moderate intensity exercise weekly engage in dietary modifications by following eating plan prescribed by Weight Management  Follow Up Plan: Telephone follow up appointment with care management team member scheduled for: ~ 6 weeks      Medication Assistance: Application for Botox, Emgality, Jardiance, and Ozempic in progress  medication assistance program. in process.  Anticipated assistance start date TBD.  See plan of care for additional detail.  Patient's preferred pharmacy is:  [facility] - [place], [place] - [address] [place] [address] Phone: [phone] Fax: [phone]   Follow Up:  Patient agrees to Care Plan and Follow-up.  Plan: Telephone follow up appointment with care management team member scheduled for:  6 weeks  Catie Darnelle Maffucci, PharmD, [place], CPP Clinical Pharmacist [place] at [facility] [phone]  Medication Samples have been provided to the patient.  Drug name: Ozempic       Strength: 0.25/0.5 mg        Qty: 1  LOT: [id number]  Exp.Date: 09/22/2023

## 2021-04-06 NOTE — Patient Instructions (Signed)
Visit Information  PATIENT GOALS:  Goals Addressed               This Visit's Progress     Patient Stated     Medication Monitoring (pt-stated)        Patient Goals/Self-Care Activities Over the next 90 days, patient will:  - take medications as prescribed check glucose daily, document, and provide at future appointments collaborate with provider on medication access solutions target a minimum of 150 minutes of moderate intensity exercise weekly engage in dietary modifications by following eating plan prescribed by Weight Management        Patient verbalizes understanding of instructions provided today and agrees to view in [place].   Plan: Telephone follow up appointment with care management team member scheduled for:  6 weeks   Catie Darnelle Maffucci, PharmD, [place], [place] Clinical Pharmacist [employer] at [employer] [phone]

## 2021-04-07 NOTE — Telephone Encounter (Signed)
Patient came in to the office and picked up medication.

## 2021-04-08 ENCOUNTER — Telehealth: Payer: Self-pay | Admitting: Pharmacy Technician

## 2021-04-08 DIAGNOSIS — Z596 Low income: Secondary | ICD-10-CM

## 2021-04-08 NOTE — Progress Notes (Signed)
[facility])                                            [place] Team                                                                         Medication Assistance Referral  Referral From: [place] Catie T.   Medication/Company: Ozempic / Novo Nordisk Patient application portion:  Education officer, museum portion:  N/A embedded PharmD had signed in clinic  to Dr. Tommi Rumps Provider address/fax verified via: Office website  Medication/Company: Vania Rea / [place] Patient application portion:  Mailed Provider application portion:  N/A Embedded PharmD had signed in clinic  to Dr. Tommi Rumps Provider address/fax verified via: Office website  Medication/Company: Verdell Face / Lilly Patient application portion:  Mailed Provider application portion: Faxed  to Dr. Sarina Ill Provider address/fax verified via: Office website  Medication/Company: Botox / Dina Rich Patient application portion:  Mailed Provider application portion: Faxed  to Dr. Sarina Ill (this will be faxed once patient's part is back b/c patient and provider have to sign the same page) Provider address/fax verified via: Office website  [place] P. [place] Safley, [place]  [phone]

## 2021-04-09 ENCOUNTER — Encounter: Payer: Self-pay | Admitting: Internal Medicine

## 2021-04-09 DIAGNOSIS — D6861 Antiphospholipid syndrome: Secondary | ICD-10-CM | POA: Diagnosis not present

## 2021-04-09 DIAGNOSIS — Z86711 Personal history of pulmonary embolism: Secondary | ICD-10-CM | POA: Diagnosis not present

## 2021-04-09 DIAGNOSIS — Z1231 Encounter for screening mammogram for malignant neoplasm of breast: Secondary | ICD-10-CM | POA: Diagnosis not present

## 2021-04-09 LAB — HM MAMMOGRAPHY

## 2021-04-09 LAB — HM PAP SMEAR: HM Pap smear: NORMAL

## 2021-04-12 ENCOUNTER — Telehealth: Payer: Self-pay | Admitting: Neurology

## 2021-04-12 NOTE — Telephone Encounter (Signed)
Pt called stating that she has had a migraine since Friday the 5th of this month and she can't seem to shake it. Pt would like to know if she can come in for a treatment. Please advise.

## 2021-04-12 NOTE — Telephone Encounter (Signed)
Spoke with Dr. Jaynee Eagles.  Okay for patient to have Toradol 60 mg injection.  I spoke with the patient to offer her an injection here. She states she has been trying to manage with the migraine but it has gotten worse and and she is fatigued. She states that she actually has 1 Toradol 60 mg vial at home from previous prescription that has not expired and also has syringes from previous Rx. 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  Patient is on Coumadin.  Dr. Jaynee Eagles aware.  Patient was advised to inject this in a muscle that she can apply good pressure to if needed.  She verbalized understanding and appreciation for the call.

## 2021-04-13 NOTE — Telephone Encounter (Signed)
Called patient.  She has been fighting a migraine for the past 18 days.  Level bounces from 7-9 pain in bilateral eyes but mainly on the right eye and neck, had some vomiting this morning, a lot of fatigue.  Last Botox was 03/16/2021.  Nurtec helped she said last week but the last 2 days it has not done anything at all and Toradol did not help.  She is in [place], has an appointment here in [place] Thursday morning at 1015 with another physician's office.  If an infusion is the next thing she request it to be done Thursday if possible.  I relayed I would let Dr. Jaynee Eagles know.  And let her know what the plan is.  She appreciated call back.

## 2021-04-13 NOTE — Telephone Encounter (Signed)
Pt called stating the Toradol shot did not help her at all. She states she cant even keep her eyes open that is how severe her migraine has been today. Pt is requesting a call back.

## 2021-04-13 NOTE — Telephone Encounter (Signed)
The order for the bone density and the referral have been placed.  Please f/u with pt regarding scheduling.  Let me know if anything more is needed.

## 2021-04-14 NOTE — Telephone Encounter (Signed)
I called patient back I relayed that unfortunately [facility] does not have any Depacon.  Was unable to do an infusion in the office per Dr. Jaynee Eagles another option would be a nerve block.  Patient was okay with the 11:00 with MM/NP enven though her headache was lifting at this point but wanted to keep that appointment available pending on how she does.  I relayed the nurse practitioner would see her first and then proceed with nerve block if felt it was needed.  Patient was okay with this she appreciated call back. She would be coming from another appt so I explained will do not be able to see if 5 min after appt.  She did verbalize understanding.

## 2021-04-14 NOTE — Telephone Encounter (Signed)
I  spoke with [facility] and there is no depacon (backordered for a month).  Would you have another option for her?

## 2021-04-15 ENCOUNTER — Encounter: Payer: Self-pay | Admitting: Adult Health

## 2021-04-15 ENCOUNTER — Encounter: Payer: Self-pay | Admitting: *Deleted

## 2021-04-15 ENCOUNTER — Ambulatory Visit: Payer: Medicare Other | Admitting: Adult Health

## 2021-04-15 ENCOUNTER — Encounter: Payer: Self-pay | Admitting: Oncology

## 2021-04-15 VITALS — BP 99/65 | HR 86 | Ht 65.0 in | Wt 180.2 lb

## 2021-04-15 DIAGNOSIS — G43711 Chronic migraine without aura, intractable, with status migrainosus: Secondary | ICD-10-CM

## 2021-04-15 DIAGNOSIS — G890 Central pain syndrome: Secondary | ICD-10-CM | POA: Diagnosis not present

## 2021-04-15 DIAGNOSIS — M62830 Muscle spasm of back: Secondary | ICD-10-CM | POA: Diagnosis not present

## 2021-04-15 DIAGNOSIS — Z79891 Long term (current) use of opiate analgesic: Secondary | ICD-10-CM | POA: Diagnosis not present

## 2021-04-15 DIAGNOSIS — G894 Chronic pain syndrome: Secondary | ICD-10-CM | POA: Diagnosis not present

## 2021-04-15 MED ORDER — NURTEC 75 MG PO TBDP: ORAL_TABLET | ORAL | 0 refills | Status: DC

## 2021-04-15 NOTE — Progress Notes (Signed)
Nerve block w/o steroid: Pt signed consent  0.5% Bupivocaine 1.5 mL LOT: [id number] EXP: 10/21/2022 NDC: [id number]  2% Lidocaine 1.5 mL LOT: [id number] EXP: 10/2021 NDC: [id number]
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
## 2021-04-20 LAB — PROTIME-INR: INR: 2.8 — AB (ref 0.9–1.1)

## 2021-04-21 ENCOUNTER — Encounter: Payer: Self-pay | Admitting: Oncology

## 2021-04-21 ENCOUNTER — Ambulatory Visit
Admission: RE | Admit: 2021-04-21 | Discharge: 2021-04-21 | Disposition: A | Discharge status: Home / Self Care | Payer: Medicare Other | Source: Ambulatory Visit | Attending: Internal Medicine | Admitting: Internal Medicine

## 2021-04-21 ENCOUNTER — Other Ambulatory Visit: Payer: Self-pay

## 2021-04-21 DIAGNOSIS — Z13820 Encounter for screening for osteoporosis: Secondary | ICD-10-CM | POA: Diagnosis not present

## 2021-04-21 DIAGNOSIS — E2839 Other primary ovarian failure: Secondary | ICD-10-CM | POA: Insufficient documentation

## 2021-04-27 ENCOUNTER — Encounter (INDEPENDENT_AMBULATORY_CARE_PROVIDER_SITE_OTHER): Payer: Self-pay | Admitting: Family Medicine

## 2021-04-27 ENCOUNTER — Telehealth: Payer: Self-pay | Admitting: Pharmacy Technician

## 2021-04-27 ENCOUNTER — Encounter: Payer: Self-pay | Admitting: *Deleted

## 2021-04-27 ENCOUNTER — Other Ambulatory Visit: Payer: Self-pay

## 2021-04-27 ENCOUNTER — Ambulatory Visit (INDEPENDENT_AMBULATORY_CARE_PROVIDER_SITE_OTHER): Payer: Medicare Other | Admitting: Family Medicine

## 2021-04-27 ENCOUNTER — Telehealth: Payer: Self-pay | Admitting: *Deleted

## 2021-04-27 VITALS — BP 117/78 | HR 93 | Temp 98.5°F | Ht 65.0 in | Wt 167.0 lb

## 2021-04-27 DIAGNOSIS — E559 Vitamin D deficiency, unspecified: Secondary | ICD-10-CM | POA: Diagnosis not present

## 2021-04-27 DIAGNOSIS — Z596 Low income: Secondary | ICD-10-CM

## 2021-04-27 DIAGNOSIS — Z7901 Long term (current) use of anticoagulants: Secondary | ICD-10-CM | POA: Diagnosis not present

## 2021-04-27 DIAGNOSIS — Z6838 Body mass index (BMI) 38.0-38.9, adult: Secondary | ICD-10-CM | POA: Diagnosis not present

## 2021-04-27 DIAGNOSIS — D6861 Antiphospholipid syndrome: Secondary | ICD-10-CM | POA: Diagnosis not present

## 2021-04-27 DIAGNOSIS — E1169 Type 2 diabetes mellitus with other specified complication: Secondary | ICD-10-CM | POA: Diagnosis not present

## 2021-04-27 LAB — PROTIME-INR: INR: 2.8 — AB (ref 0.9–1.1)

## 2021-04-27 MED ORDER — NURTEC 75 MG PO TBDP: 75.0000 mg | ORAL_TABLET | Freq: Every day | ORAL | 0 refills | Status: DC | PRN

## 2021-04-27 MED ORDER — SEMAGLUTIDE (1 MG/DOSE) 4 MG/3ML ~~LOC~~ SOPN: 1.0000 mg | PEN_INJECTOR | SUBCUTANEOUS | 0 refills | Status: DC

## 2021-04-27 NOTE — Telephone Encounter (Signed)
Phone room called concerning patient . Pt called for more samples of Nurtec . Ward Givens NP  approved 4 tablets . Contacted explained will give today per megan but explained to patient next time have to go through insurance. Pt understood plan

## 2021-04-27 NOTE — Progress Notes (Signed)
[facility])                                            [place] Team    04/27/2021  Amanda Vasquez 1971/01/28 [id number]  Received both patient and provider portion(s) of patient assistance application(s) for Ozempic and Jardiance. Faxed completed application and required documents into [employer] and [employer] respectively.  Also refaxed provider portion of application for Emgality with [employer] as prescriber signature was missing off the last page.   Received back patient's portion of Botox application with [employer] and therefore faxed over provider's portion to Dr. Sarina Ill today.   When received back will submit those to [employer].  Romano Stigger P. Vershawn Westrup, [place]  [phone]

## 2021-04-28 ENCOUNTER — Telehealth: Payer: Self-pay | Admitting: *Deleted

## 2021-04-28 ENCOUNTER — Encounter: Payer: Self-pay | Admitting: Oncology

## 2021-04-28 NOTE — Telephone Encounter (Signed)
I was provided a new fax number of [phone]. Faxed forms and received receipt of confirmation. Sharee Pimple also confirmed receipt of fax.

## 2021-04-28 NOTE — Telephone Encounter (Signed)
Received patient assistance prescription forms from [facility] @ [employer] for both Emgality 120 mg and Botox. Added Dx code and units required to the Botox sheet as per requested. The prescriptions were signed by Dr Jaynee Eagles and I attempted to fax to [phone] as noted on the form. Fax not going through. I call Sharee Pimple. She will let me know of another fax number asap.

## 2021-04-28 NOTE — Progress Notes (Signed)
Chief Complaint:   OBESITY Liam is here to discuss her progress with her obesity treatment plan along with follow-up of her obesity related diagnoses. Loleta is on keeping a food journal and adhering to recommended goals of 1500 calories and 90 grams of protein and states she is following her eating plan approximately 93% of the time. Shatonya states she is walking for 60 minutes 3-4 times per week.  Today's visit was #: 14 Starting weight: 231 lbs Starting date: 06/01/2020 Today's weight: 167 lbs Today's date: 04/27/2021 Total lbs lost to date: 64 lbs Total lbs lost since last in-office visit: 1 lb  Interim History: Keymani's weight has been essentially plateaued for the past 2-3 months. Her goal is 160 lbs (26 BMI). She is 167 lbs today. She is adhering to the plan very well.  Subjective:   1. Type 2 diabetes mellitus with other specified complication, without long-term current use of insulin ([place]) Eudora's diabetes mellitus is well controlled. She is on Jardiance 25 mg, Ozempic 0.5 mg and Metformin. She denies hypoglycemia.  Lab Results  Component Value Date   HGBA1C 6.4 (H) 10/21/2020   HGBA1C 8.6 (H) 06/01/2020   HGBA1C 7.3 (H) 01/21/2020   Lab Results  Component Value Date   MICROALBUR <0.2 01/23/2020   LDLCALC 41 10/21/2020   CREATININE 0.79 02/13/2021   Lab Results  Component Value Date   INSULIN 24.3 06/01/2020    2. Vitamin D deficiency Zorana's Vitamin D is at goal. She is on 2000 IU Vitamin D.   Lab Results  Component Value Date   VD25OH 78.2 10/21/2020   VD25OH 32.2 06/01/2020   VD25OH 32.97 09/19/2018    Assessment/Plan:   1. Type 2 diabetes mellitus with other specified complication, without long-term current use of insulin ([name]) Increase dose Ozempic. We will fill Ozempic 1 mg weekly.  - Semaglutide, 1 MG/DOSE, 4 MG/3ML SOPN; Inject 1 mg as directed once a week.  Dispense: 3 mL; Refill: 0  2. Vitamin D deficiency Ravyn agrees to continue to take OTC  Vitamin D 2,000 IU every week and she will follow-up for routine testing of Vitamin D, at least 2-3 times per year to avoid over-replacement. We will check Vitamin D on next office visit.   3. Obesity: Current BMI 27.96 Krystan is currently in the action stage of change. As such, her goal is to continue with weight loss efforts. She has agreed to keeping a food journal and adhering to recommended goals of 1500 calories and 90 grams of protein.   We will check Zakya's IC next office visit and labs.  Exercise goals:  As is.  Behavioral modification strategies: planning for success.  Shi has agreed to follow-up with our clinic in 3-4 weeks.  Objective:   Blood pressure 117/78, pulse 93, temperature 98.5 F (36.9 C), height '5\' 5"'$  (1.651 m), weight 167 lb (75.8 kg), last menstrual period 03/05/2018, SpO2 97 %. Body mass index is 27.79 kg/m.  General: Cooperative, alert, well developed, in no acute distress. HEENT: Conjunctivae and lids unremarkable. Cardiovascular: Regular rhythm.  Lungs: Normal work of breathing. Neurologic: No focal deficits.   Lab Results  Component Value Date   CREATININE 0.79 02/13/2021   BUN 17 02/13/2021   NA 141 02/13/2021   K 4.6 02/13/2021   CL 105 02/13/2021   CO2 29 02/13/2021   Lab Results  Component Value Date   ALT 18 10/21/2020   AST 19 10/21/2020   ALKPHOS 72 10/21/2020  BILITOT 0.2 10/21/2020   Lab Results  Component Value Date   HGBA1C 6.4 (H) 10/21/2020   HGBA1C 8.6 (H) 06/01/2020   HGBA1C 7.3 (H) 01/21/2020   HGBA1C 6.7 (A) 02/18/2019   HGBA1C 7.8 (H) 09/19/2018   Lab Results  Component Value Date   INSULIN 24.3 06/01/2020   Lab Results  Component Value Date   TSH 1.450 06/01/2020   Lab Results  Component Value Date   CHOL 113 10/21/2020   HDL 41 10/21/2020   LDLCALC 41 10/21/2020   LDLDIRECT 80.0 09/19/2018   TRIG 192 (H) 10/21/2020   CHOLHDL 2 01/21/2020   Lab Results  Component Value Date   VD25OH 78.2 10/21/2020    VD25OH 32.2 06/01/2020   VD25OH 32.97 09/19/2018   Lab Results  Component Value Date   WBC 6.9 02/13/2021   HGB 14.0 02/13/2021   HCT 42.4 02/13/2021   MCV 87.6 02/13/2021   PLT 254 02/13/2021   Lab Results  Component Value Date   IRON 65 05/06/2010    Obesity Behavioral Intervention:   Approximately 15 minutes were spent on the discussion below.  ASK: We discussed the diagnosis of obesity with Tangie today and Nicoya agreed to give [place] permission to discuss obesity behavioral modification therapy today.  ASSESS: Zylphia has the diagnosis of obesity and her BMI today is 27.9. Naadira is in the action stage of change.   ADVISE: Rannie was educated on the multiple health risks of obesity as well as the benefit of weight loss to improve her health. She was advised of the need for long term treatment and the importance of lifestyle modifications to improve her current health and to decrease her risk of future health problems.  AGREE: Multiple dietary modification options and treatment options were discussed and Ryker agreed to follow the recommendations documented in the above note.  ARRANGE: Luttie was educated on the importance of frequent visits to treat obesity as outlined per CMS and USPSTF guidelines and agreed to schedule her next follow up appointment today.  Attestation Statements:   Reviewed by clinician on day of visit: allergies, medications, problem list, medical history, surgical history, family history, social history, and previous encounter notes.  I, Lizbeth Bark, RMA, am acting as Location manager for [employer], [place].   I have reviewed the above documentation for accuracy and completeness, and I agree with the above. -  Georgianne Fick, FNP

## 2021-04-30 ENCOUNTER — Telehealth: Payer: Self-pay | Admitting: Pharmacy Technician

## 2021-04-30 DIAGNOSIS — Z596 Low income: Secondary | ICD-10-CM

## 2021-04-30 NOTE — Progress Notes (Signed)
[place] [facility])                                            [place] Team    04/30/2021  Amanda Vasquez 13-Oct-1970 [id number]  Two care coordination phones calls made to 2 different patient assistance companies in regards to determination status of Ozempic application with [employer] and [place] application with [employer].  Spoke to Idalou at [employer] patient assistance company in regards to [employer] determination. She informed patient was APPROVED 04/28/21-08/21/21. She informed the request was sent to the pharmacy on 04/28/21 and usually would take 10-14 business days to arrive at the provider's office but she did inform that they are having some shipping delays so the delivery may take longer. She informs this would be the only fill the patient receives this year and she would need to reapply in 2023 and that could take place any time after 06/05/21.   Spoke to Peck at [facility] in regards to [place] determination. She informed patient was DENIED for the program due to being over income. Her income is at 290%FPL and the income needs to be 250%FPL or less to qualify for their patient assistance program. In basket message sent to embedded PharmD to discuss alternatives. Farxiga with [employer] could potentially be an option if provider and PharmD as well we patient agree with change.  Amanda Vasquez, [place]  [phone]

## 2021-05-01 ENCOUNTER — Other Ambulatory Visit: Payer: Self-pay | Admitting: Oncology

## 2021-05-04 ENCOUNTER — Telehealth: Payer: Self-pay | Admitting: Pharmacy Technician

## 2021-05-04 DIAGNOSIS — Z596 Low income: Secondary | ICD-10-CM

## 2021-05-04 LAB — PROTIME-INR: INR: 3.2 — AB (ref 0.9–1.1)

## 2021-05-04 NOTE — Progress Notes (Signed)
[place])                                            [place] Team    05/04/2021  Amanda Vasquez 22-Jun-1971 [id number]  Received both patient and provider portion(s) of patient assistance application(s) for Emgality and Botox. Faxed completed application and required documents into [employer] and [employer] respectively.    [name], [place]  [phone]

## 2021-05-05 ENCOUNTER — Encounter: Payer: Self-pay | Admitting: Oncology

## 2021-05-06 ENCOUNTER — Other Ambulatory Visit: Payer: Self-pay | Admitting: *Deleted

## 2021-05-06 MED ORDER — WARFARIN SODIUM 10 MG PO TABS: 10.0000 mg | ORAL_TABLET | Freq: Every day | ORAL | 3 refills | Status: DC

## 2021-05-10 ENCOUNTER — Ambulatory Visit (AMBULATORY_SURGERY_CENTER): Payer: Medicare Other | Admitting: *Deleted

## 2021-05-10 ENCOUNTER — Other Ambulatory Visit: Payer: Self-pay

## 2021-05-10 VITALS — Ht 65.0 in | Wt 167.0 lb

## 2021-05-10 DIAGNOSIS — Z1211 Encounter for screening for malignant neoplasm of colon: Secondary | ICD-10-CM

## 2021-05-10 MED ORDER — PEG 3350-KCL-NA BICARB-NACL 420 G PO SOLR
4000.0000 mL | Freq: Once | ORAL | 0 refills | Status: AC
Start: 2021-05-10 — End: 2021-05-10

## 2021-05-10 NOTE — Progress Notes (Signed)
Patient's pre-visit was done today over the phone with the patient due to COVID-19 pandemic. Name,DOB and address verified. Patient denies any allergies to Eggs and Soy. Patient denies any problems with anesthesia/sedation. Patient is not taking any diet pills. No home Oxygen. Packet of Prep instructions  including a copy of a consent form is on 3rd floor-pt is aware. Patient understands to call us back with any questions or concerns. Patient is aware of our care-partner policy and YOMAY-04 safety protocol.   The patient is COVID-19 vaccinated.  Patient will come to 3rd floor to pick up Plenvu samle-1 kit HTX#[id number], exp. 04/2022. Patient aware of Lovenox bridge.

## 2021-05-11 LAB — PROTIME-INR: INR: 3.5 — AB (ref 0.9–1.1)

## 2021-05-12 ENCOUNTER — Encounter: Payer: Self-pay | Admitting: Gastroenterology

## 2021-05-12 ENCOUNTER — Other Ambulatory Visit: Payer: Self-pay | Admitting: *Deleted

## 2021-05-12 ENCOUNTER — Encounter: Payer: Self-pay | Admitting: Oncology

## 2021-05-12 MED ORDER — ENOXAPARIN SODIUM 100 MG/ML IJ SOSY: 100.0000 mg | PREFILLED_SYRINGE | Freq: Two times a day (BID) | INTRAMUSCULAR | 0 refills | Status: DC

## 2021-05-13 ENCOUNTER — Telehealth: Payer: Self-pay | Admitting: *Deleted

## 2021-05-13 ENCOUNTER — Ambulatory Visit: Payer: Medicare Other | Admitting: Cardiology

## 2021-05-13 ENCOUNTER — Encounter: Payer: Self-pay | Admitting: Cardiology

## 2021-05-13 ENCOUNTER — Other Ambulatory Visit: Payer: Self-pay

## 2021-05-13 VITALS — BP 90/50 | HR 98 | Ht 65.0 in | Wt 177.0 lb

## 2021-05-13 DIAGNOSIS — D6861 Antiphospholipid syndrome: Secondary | ICD-10-CM | POA: Diagnosis not present

## 2021-05-13 DIAGNOSIS — I700 Atherosclerosis of aorta: Secondary | ICD-10-CM | POA: Diagnosis not present
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
## 2021-05-13 NOTE — Telephone Encounter (Signed)
Sherry please call her

## 2021-05-13 NOTE — Patient Instructions (Signed)

## 2021-05-13 NOTE — Telephone Encounter (Signed)
[facility]ed [facility] and told them that her insurance would pay for 100 mg syringes and not any other dose. So I ordered it and she needs to waste the extra medication in syringe until she gets to 0.75. When I put the order in for 100 mg it auto fills the spot even though I say as directed and put the exact dose in the directed area. But it still looks like 2 orders in the same medication order. Spoke to pharmacist and they are ok and then called pt and told her and she has done this before and feels ok to give and if she has any questions she will let me know

## 2021-05-13 NOTE — Telephone Encounter (Signed)
Pharmacy called reporting that there are 2 sets of instructions on the Lovenox prescription sent and they need a return call or new prescription sent in to clarify directions.

## 2021-05-14 ENCOUNTER — Telehealth: Payer: Self-pay | Admitting: Pharmacy Technician

## 2021-05-14 DIAGNOSIS — Z596 Low income: Secondary | ICD-10-CM

## 2021-05-14 NOTE — Progress Notes (Signed)
[facility])                                            [place] Team    05/14/2021  [name] 05-May-1971 [id number]  Care coordination call placed to [employer] in regards to Botox patient assistance application.  Spoke to Elizabeth at [place] who informs they have received the application. She reviewed what was submitted and informed of some missing information. She informed patient should explore other oppoturnities before applying for assistance. She informed she needs to explore foundations. Relayed to her that embedded PharmD, Catie Darnelle Maffucci,  has looked and informed there were none at this time opened..   She informed she needs to explore Extra Help. From what is listed on Semi[employer].tn, the 2022 guidelines state "To qualify for Extra Help, your resources must be limited  to $15,510 for an individual or $30,950 for a married couple living together." The amount she listed on the application exceeds this amount for a household of 1.  THEREFORE, what she needs to submit is a hardship type letter explaining why she is applying to their program. She needs to include that their are no foundations opened and does not qualify for Extra Help. Letter also needs to include her insurance premium amounts that she pays (see 1099 for the yearly amount),  the amount of ALL medical type expenses including but not limited to copays for medications including botox, copays for dr visits, dental visits, eye visits, hospital visits, lab work, etc and so forth. Documentation of this is HIGHLY recommended as well (EOB from insurance company would contain this information). The OOP for the medications must include name, dosage, directions and quantity as well as price. They also need to know in that letter whether the medication is billed thru Med D or Med B.    In basket message sent to embedded PharmD Catie Darnelle Maffucci for assistance.  Amanda Vasquez,  [place]  [phone]

## 2021-05-14 NOTE — Progress Notes (Signed)
[facility])                                            [place] Team    05/14/2021  Amanda Vasquez [birth year] [id number]  Care coordination call placed to [employer] in regards to [facility] application.  Spoke to Candida who informed patient was APPROVED 05/04/21-08/21/21. She informed medication shipped and was delivered on 05/12/21 to the front door of patient's home. Tracking number is [id number].  [name] [name], [place]  [phone]

## 2021-05-17 ENCOUNTER — Ambulatory Visit (INDEPENDENT_AMBULATORY_CARE_PROVIDER_SITE_OTHER): Payer: Medicare Other | Admitting: Family Medicine

## 2021-05-19 ENCOUNTER — Telehealth: Payer: Self-pay

## 2021-05-19 NOTE — Telephone Encounter (Signed)
I called the patient and informed her that her Ozempic is here and is ready for pickup, she understood.  Hiyab Nhem,cma

## 2021-05-20 ENCOUNTER — Telehealth: Payer: Self-pay | Admitting: Pharmacist

## 2021-05-20 ENCOUNTER — Ambulatory Visit (INDEPENDENT_AMBULATORY_CARE_PROVIDER_SITE_OTHER): Payer: Medicare Other | Admitting: Pharmacist

## 2021-05-20 DIAGNOSIS — F308 Other manic episodes: Secondary | ICD-10-CM

## 2021-05-20 DIAGNOSIS — E785 Hyperlipidemia, unspecified: Secondary | ICD-10-CM

## 2021-05-20 DIAGNOSIS — E1169 Type 2 diabetes mellitus with other specified complication: Secondary | ICD-10-CM

## 2021-05-20 DIAGNOSIS — I700 Atherosclerosis of aorta: Secondary | ICD-10-CM

## 2021-05-20 DIAGNOSIS — M546 Pain in thoracic spine: Secondary | ICD-10-CM

## 2021-05-20 MED ORDER — DAPAGLIFLOZIN PROPANEDIOL 10 MG PO TABS: 10.0000 mg | ORAL_TABLET | Freq: Every day | ORAL | 0 refills | Status: DC

## 2021-05-20 NOTE — Chronic Care Management (AMB) (Signed)
Chronic Care Management Pharmacy Note  05/20/2021 Name:  ACIRE TANG MRN:  [id number] DOB:  [birth year]   Subjective: Amanda Vasquez is an 50 y.o. year old female who is a primary patient of McLean-Scocuzza, Nino Glow, MD. Collaborating with covering provider Tommi Rumps, MD while provider is on leave. The CCM team was consulted for assistance with disease management and care coordination needs.    Engaged with patient by telephone for follow up visit in response to provider referral for pharmacy case management and/or care coordination services.   Consent to Services:  The patient was given information about Chronic Care Management services, agreed to services, and gave verbal consent prior to initiation of services.  Please see initial visit note for detailed documentation.   Patient Care Team: McLean-Scocuzza, Nino Glow, MD as PCP - General (Internal Medicine) Marcial Pacas, MD as Consulting Physician (Neurology) Jovita Gamma, MD as Consulting Physician (Neurosurgery) Roseanne Kaufman, MD as Consulting Physician (Orthopedic Surgery) Nicholaus Bloom, MD (Anesthesiology) Pedro Earls, MD (Sports Medicine) Marjie Skiff, MD (Psychiatry) Saffo, Delcie Roch, MD (Psychology) De Hollingshead, RPH-CPP (Pharmacist)    Objective:  Lab Results  Component Value Date   CREATININE 0.79 02/13/2021   CREATININE 0.71 10/21/2020   CREATININE 0.63 06/01/2020    Lab Results  Component Value Date   HGBA1C 6.4 (H) 10/21/2020   Last diabetic Eye exam:  Lab Results  Component Value Date/Time   HMDIABEYEEXA No Retinopathy 01/27/2021 12:00 AM    Last diabetic Foot exam:  Lab Results  Component Value Date/Time   HMDIABFOOTEX Yes 12/21/2009 12:00 AM        Component Value Date/Time   CHOL 113 10/21/2020 1533   TRIG 192 (H) 10/21/2020 1533   HDL 41 10/21/2020 1533   CHOLHDL 2 01/21/2020 1144   VLDL 39.2 01/21/2020 1144   LDLCALC 41 10/21/2020 1533   LDLDIRECT 80.0  09/19/2018 1139    Hepatic Function Latest Ref Rng & Units 10/21/2020 06/01/2020 01/21/2020  Total Protein 6.0 - 8.5 g/dL 6.8 6.5 6.7  Albumin 3.8 - 4.8 g/dL 4.5 3.8 4.3  AST 0 - 40 IU/L 19 20 46(H)  ALT 0 - 32 IU/L 18 17 51(H)  Alk Phosphatase 44 - 121 IU/L 72 72 59  Total Bilirubin 0.0 - 1.2 mg/dL 0.2 <0.2 0.3  Bilirubin, Direct 0.0 - 0.3 mg/dL - - -    Lab Results  Component Value Date/Time   TSH 1.450 06/01/2020 10:10 AM   TSH 1.53 01/21/2020 11:44 AM   FREET4 0.77 01/20/2015 02:12 PM   FREET4 0.76 05/06/2010 03:16 PM    CBC Latest Ref Rng & Units 02/13/2021 06/01/2020 01/21/2020  WBC 4.0 - 10.5 K/uL 6.9 5.4 5.3  Hemoglobin 12.0 - 15.0 g/dL 14.0 11.8 13.3  Hematocrit 36.0 - 46.0 % 42.4 37.0 40.2  Platelets 150 - 400 K/uL 254 269 244.0    Lab Results  Component Value Date/Time   VD25OH 78.2 10/21/2020 03:33 PM   VD25OH 32.2 06/01/2020 10:10 AM   VD25OH 32.97 09/19/2018 11:39 AM   VD25OH 46.22 03/10/2016 11:41 AM    Social History   Tobacco Use  Smoking Status Former   Packs/day: 0.25   Years: 20.00   Pack years: 5.00   Types: Cigarettes   Start date: 06/08/1986   Quit date: 08/22/2010   Years since quitting: 10.7  Smokeless Tobacco Never   BP Readings from Last 3 Encounters:  05/13/21 (!) 90/50  04/27/21 117/78  04/15/21 99/65  Pulse Readings from Last 3 Encounters:  05/13/21 98  04/27/21 93  04/15/21 86   Wt Readings from Last 3 Encounters:  05/13/21 177 lb (80.3 kg)  05/10/21 167 lb (75.8 kg)  04/27/21 167 lb (75.8 kg)    Assessment: Review of patient past medical history, allergies, medications, health status, including review of consultants reports, laboratory and other test data, was performed as part of comprehensive evaluation and provision of chronic care management services.   SDOH:  (Social Determinants of Health) assessments and interventions performed:  SDOH Interventions    Flowsheet Row Most Recent Value  SDOH Interventions   Financial  Strain Interventions Other (Comment)  [manufacturer assistance]       CCM Care Plan  Allergies  Allergen Reactions   Latex Hives    Medications Reviewed Today     Reviewed by Kate Sable, MD (Physician) on 05/13/21 at 1156  Med List Status: <None>   Medication Order Taking? Sig Documenting Provider Last Dose Status Informant  ALPRAZolam (XANAX) 0.5 MG tablet [id number] Yes TAKE 1 TABLET BY MOUTH DAILY AS NEEDED FOR ANXIETY McLean-Scocuzza, Nino Glow, MD Taking Active            Med Note Tyrone Sage May 13, 2021 11:25 AM)    atorvastatin (LIPITOR) 40 MG tablet [id number] Yes Take 1 tablet (40 mg total) by mouth daily at 6 PM. McLean-Scocuzza, Nino Glow, MD Taking Active   baclofen (LIORESAL) 10 MG tablet [id number] Yes Take 10 mg by mouth 2 (two) times daily.  Rowe Clack, MD Taking Active Self           Med Note De Hollingshead   Tue Apr 06, 2021  3:33 PM)    Botulinum Toxin Type A (BOTOX) 200 units SOLR [id number] Yes Provider to inject 155 units into the muscles of the head and neck every 3 months. Discard remainder. Melvenia Beam, MD Taking Active   buPROPion (WELLBUTRIN XL) 300 MG 24 hr tablet [id number] Yes TAKE ONE TABLET BY MOUTH EVERY DAY McLean-Scocuzza, Nino Glow, MD Taking Active   calcium carbonate (OSCAL) 1500 (600 Ca) MG TABS tablet [id number] Yes Take by mouth daily with breakfast. [provider] Taking Active   Cetirizine HCl 10 MG CAPS [id number] Yes Take 1 capsule (10 mg total) by mouth daily as needed. Take by mouth. Rowe Clack, MD Taking Active Self  enoxaparin (LOVENOX) 100 MG/ML injection [id number] Yes Inject 1 mL (100 mg total) into the skin every 12 (twelve) hours. Patient will need to  give 0.75 lovenox  dose q 12hours 2 days before colonoscopy, and start it back the evening of the procedure and continue the inj. Every 12 hours until coumadin level gets to 3. Sindy Guadeloupe, MD Taking Active   Galcanezumab-gnlm  [facility]) 120 MG/ML Darden Palmer [id number] Yes Inject 120 mg into the skin every 30 (thirty) days. [provider] Taking Active   GRALISE 600 MG TABS [id number] Yes Take 1,800 mg by mouth every evening.  [provider] Taking Active Self           Med Note Tawni Carnes Oct 02, 2017  7:28 PM)    JARDIANCE 25 MG TABS tablet [id number] Yes TAKE ONE TABLET BY MOUTH EVERY DAY Elayne Snare, MD Taking Active   metFORMIN (GLUCOPHAGE) 1000 MG tablet [id number] Yes Take 1 tablet (1,000 mg total) by mouth 2 (two) times daily with a meal. McLean-Scocuzza, Nino Glow, MD Taking Active  morphine (MS CONTIN) 60 MG 12 hr tablet [id number] Yes Take 60 mg by mouth every 12 (twelve) hours. [provider] Taking Active Self  Multiple Vitamin (MULTIVITAMIN) tablet [id number] Yes Take 1 tablet by mouth daily. [provider] Taking Active   naloxegol oxalate (MOVANTIK) 25 MG TABS tablet [id number] Yes Take by mouth daily. [provider] Taking Active   oxyCODONE (OXY IR/ROXICODONE) 5 MG immediate release tablet [id number] Yes Take 1 tablet (5 mg total) by mouth every 4 (four) hours as needed for severe pain. Per pain clinic Dr Wylie Hail, Jannifer Rodney, MD Taking Active Self           Med Note Tyrone Sage May 13, 2021 11:25 AM)    Polyethylene Glycol 3350 (MIRALAX PO) [id number] Yes Take by mouth as needed. [provider] Taking Active Self  promethazine (PHENERGAN) 25 MG tablet [id number] Yes Take 1 tablet (25 mg total) by mouth 2 (two) times daily as needed for nausea or vomiting. McLean-Scocuzza, Nino Glow, MD Taking Active   propranolol ER (INDERAL LA) 80 MG 24 hr capsule [id number] Yes Take 1 capsule (80 mg total) by mouth daily. McLean-Scocuzza, Nino Glow, MD Taking Active   Rimegepant Sulfate (NURTEC) 75 MG TBDP [id number] Yes Take 75 mg by mouth daily as needed. For migraines. Take as close to onset of migraine as possible. One daily maximum. Melvenia Beam, MD Taking Active   Semaglutide, 1 MG/DOSE, 4 MG/3ML Bon[place] Aid [id number] Yes Inject 1 mg as directed once a week. [place], Joneen Boers, FNP Taking Active   sertraline (ZOLOFT) 100 MG tablet [id number] Yes Take 1.5 tablets (150 mg total) by mouth daily. McLean-Scocuzza, Nino Glow, MD Taking Active   traZODone (DESYREL) 100 MG tablet [id number] Yes Take 1 tablet (100 mg total) by mouth at bedtime as needed. for sleep McLean-Scocuzza, Nino Glow, MD Taking Active   warfarin (COUMADIN) 10 MG tablet [id number] Yes Take 1 tablet (10 mg total) by mouth daily. Sindy Guadeloupe, MD Taking Active             Patient Active Problem List   Diagnosis Date Noted   Bladder wall thickening 03/23/2021   DDD (degenerative disc disease), lumbar 03/22/2021   Constipation 03/22/2021   Estrogen deficiency 03/22/2021   Aortic atherosclerosis ([place]) 03/12/2021   Chronic insomnia 01/20/2021   Vitamin D deficiency 10/21/2020   Hyperlipidemia associated with type 2 diabetes mellitus ([place]) 06/05/2020   Arthritis of right shoulder region 06/04/2020   Acute meniscal tear of left knee    Right shoulder pain 05/11/2020   Chronic pain of right knee 01/21/2020   Hypotension 01/21/2020   Right knee meniscal tear 08/01/2019   Anti-phospholipid antibody syndrome ([place]) 03/22/2019   Indigestion 07/27/2018   Chronic migraine without aura, with intractable migraine, so stated, with status migrainosus 01/29/2018   Cervicalgia 01/12/2018   Acute pain of left shoulder 01/12/2018   Acute bilateral low back pain without sciatica 01/12/2018   Muscle strain 01/12/2018   Breast pain in female 10/19/2017   Vaginal bleeding 10/19/2017   Pulmonary emboli ([place]) 10/02/2017   Migraine headache 05/17/2017   Anxiety and depression 01/30/2017   Dot and blot hemorrhage, left 01/19/2017   Gastroparesis 06/22/2016   Fatigue 03/10/2016   Pre-operative cardiovascular examination 09/21/2015   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   CTS (carpal  tunnel syndrome) 02/25/2015   Paresthesias 02/02/2015   Demyelinating nervous system disease or syndrome ([place]) 01/20/2015   Demyelinating disease of central  nervous system ([place]) 03/01/2012   Thalamic pain syndrome (hyperesthetic) 09/07/2010   Hyperlipidemia 05/28/2010   Class 2 severe obesity with serious comorbidity and body mass index (BMI) of 38.0 to 38.9 in adult ([place]) 02/26/2010   Herpes simplex virus (HSV) infection 12/21/2009   ALLERGIC RHINITIS 12/21/2009   BACK PAIN, THORACIC REGION 11/11/2009   Diabetes mellitus ([place]) 07/28/2008   AMENORRHEA 12/17/2007   DISORDER, BIPOLAR, ATYPICAL MANIC 05/14/2007    Immunization History  Administered Date(s) Administered   Influenza Split 06/22/2011   Influenza,inj,Quad PF,6+ Mos 04/26/2013, 05/22/2014, 05/17/2017, 05/31/2018, 05/13/2019   Influenza-Unspecified 07/07/2015, 05/06/2020, 05/07/2020   Moderna SARS-COV2 Booster Vaccination 12/01/2020   Moderna Sars-Covid-2 Vaccination 11/01/2019, 12/03/2019, 04/20/2020   Pneumococcal Polysaccharide-23 09/12/2012, 10/03/2017   Td 06/07/2006   Tdap 10/26/2018, 10/26/2018   Zoster Recombinat (Shingrix) 03/10/2021    Conditions to be addressed/monitored: DMII and obesity  Care Plan : Medication Management  Updates made by De Hollingshead, RPH-CPP since 05/20/2021 12:00 AM     Problem: Diabetes, Obesity, Chronic Pain, APL hx VTE      Long-Range Goal: Disease Progression Prevention   Start Date: 09/22/2020  This Visit's Progress: On track  Recent Progress: On track  Priority: High  Note:   Current Barriers:  Unable to independently afford treatment regimen Complex patient with multiple comorbidities that increase risk of hospitalization, including depression and anxiety, chronic pain, migraines, periodic nausea and vomiting, gastroparesis, antiphospholipid antibody  Pharmacist Clinical Goal(s):  Over the next 90 days, patient will achieve adherence to monitoring guidelines and  medication adherence to achieve therapeutic efficacy through collaboration with PharmD and provider.   Interventions: 1:1 collaboration with McLean-Scocuzza, Nino Glow, MD regarding development and update of comprehensive plan of care as evidenced by provider attestation and co-signature Inter-disciplinary care team collaboration (see longitudinal plan of care) Comprehensive medication review performed; medication list updated in electronic medical record  Diabetes/Obesity: Controlled; current treatment: metformin 1000 mg BID, Jardiance 25 mg daily, Ozempic 1 mg weekly; follows Weight Management Approved for Ozempic assistance through [employer]. Received supply. Reviewed refill procedure.  Over income for [place], however, she would qualify for [place] assistance through [employer]. Patient amenable to changing. She will complete current Jardiance supply. I will prepare a sample of Farxiga 10 mg for her to start and we will start the process for [place] assistance through [employer].   Hyperlipidemia and ASCVD risk reduction: Controlled per last lipid panel; current treatment: atorvastatin 40 mg daily Recommended to continue current regimen at this time  Depression/Anxiety: Controlled per patient report; current treatment: alprazolam 0.5 mg daily PRN (reports infrequent use, but is unable to specify how often she needs) bupropion XL 300 mg daily, sertraline 150 mg daily, trazodone 150 mg QPM Recommended to continue current regimen at this time  Chronic Pain: Managed per Dr. Eartha [facility] in [place]; morphine CR 60 mg BID, oxycodone 5 mg Q4H PRN, baclofen 10 mg BID, gabapentin ER (Gralise) 600 mg daily Reports Gralise is expensive. No patient assistance available at this time or foundation funds.  Recommend to continue current regimen at this time along with collaboration with pain management  Antiphospholipid antibody syndrome: Appropriately managed; current treatment:  warfarin 10 mg; managed by hem/onc Dr. Janese Banks Home INR monitor, communicates w/ team via [place]; upcoming colonoscopy and she is being bridged Recommended to continue current regimen at this time along with collaboration with hematology.   Chronic Migraines: Controlled per patient report; current treatment: Emgality 120 mg monthly , Botox Q3 months,  Nurtec 75 mg PRN; propranolol 80 mg daily; follows w/ Dr Jaynee Eagles Approved for [facility] assistance through 08/21/21. Reviewed refill procedure today. Botox assistance program did not accept patient. There is a process for an appeal, but patient would need to submit significant financial documentation. We mutually agreed to hold off on that process at this time.  Recommended to continue current regimen at this time along with collaboration with neurology.  Follow for re-opening of [place] fund for assistance with copay of other medications.    Patient Goals/Self-Care Activities Over the next 90 days, patient will:  - take medications as prescribed check glucose daily, document, and provide at future appointments collaborate with provider on medication access solutions target a minimum of 150 minutes of moderate intensity exercise weekly engage in dietary modifications by following eating plan prescribed by Weight Management  Follow Up Plan: Face to Face appointment with care management team member scheduled for:  12 weeks     Medication Assistance:  Emgality obtained through [employer] medication assistance program.  Enrollment ends 08/21/21.  Ozempic obtained through [employer] medication assistance program.  Enrollment ends 08/21/21. Pursuing assistance for [place] through [employer].   Patient's preferred pharmacy is:  [facility] - [place], [place] - [address] [place] [address] Phone: [phone] Fax: [phone]   Follow Up:  Patient agrees to Care Plan and Follow-up.  Plan: Face  to Face appointment with care management team member scheduled for: 8 weeks  Catie Darnelle Maffucci, PharmD, [place], [place] Clinical Pharmacist [employer] at [employer] [phone]

## 2021-05-20 NOTE — Patient Instructions (Signed)
Visit Information  PATIENT GOALS:  Goals Addressed               This Visit's Progress     Patient Stated     Medication Monitoring (pt-stated)        Patient Goals/Self-Care Activities Over the next 90 days, patient will:  - take medications as prescribed check glucose daily, document, and provide at future appointments collaborate with provider on medication access solutions target a minimum of 150 minutes of moderate intensity exercise weekly engage in dietary modifications by following eating plan prescribed by Weight Management         Patient verbalizes understanding of instructions provided today and agrees to view in [place].   Plan: Face to Face appointment with care management team member scheduled for: 8 weeks  Catie Darnelle Maffucci, PharmD, [place], [place] Clinical Pharmacist [employer] at [employer] [phone]

## 2021-05-20 NOTE — Telephone Encounter (Signed)
Medication Samples have been labeled and logged for the patient.  Drug name: Wilder Glade       Strength: 10 mg        Qty: 2 boxes  LOT: [id number]  Exp.Date: 07/22/23  Dosing instructions: Take 1 tablet by mouth daily  The patient has been instructed regarding the correct time, dose, and frequency of taking this medication, including desired effects and most common side effects.   De Hollingshead 4:06 PM 05/20/2021

## 2021-05-21 ENCOUNTER — Telehealth: Payer: Self-pay | Admitting: Pharmacy Technician

## 2021-05-21 ENCOUNTER — Encounter: Payer: Self-pay | Admitting: Oncology

## 2021-05-21 ENCOUNTER — Encounter: Payer: Self-pay | Admitting: *Deleted

## 2021-05-21 DIAGNOSIS — E785 Hyperlipidemia, unspecified: Secondary | ICD-10-CM

## 2021-05-21 DIAGNOSIS — Z596 Low income: Secondary | ICD-10-CM

## 2021-05-21 DIAGNOSIS — E1169 Type 2 diabetes mellitus with other specified complication: Secondary | ICD-10-CM

## 2021-05-21 NOTE — Progress Notes (Signed)
[facility] [facility])                                            [place] Team    05/21/2021  Amanda Vasquez 26-Mar-1971 [id number]                                      Medication Assistance Referral  Referral From: [place] RPh Catie T.   Medication/Company: Wilder Glade / [place] Patient application portion:  N/A Embedded PharmD to have sign in clinic Provider application portion:  N/A Embedded PharmD to have sign in clinic  to Dr. Olivia Mackie McLean-Scocuzza Provider address/fax verified via: Office website   Teela Narducci P. Jedi [place], [place]  [phone]

## 2021-05-24 ENCOUNTER — Other Ambulatory Visit: Payer: Self-pay

## 2021-05-24 ENCOUNTER — Encounter: Payer: Self-pay | Admitting: Gastroenterology

## 2021-05-24 ENCOUNTER — Ambulatory Visit (AMBULATORY_SURGERY_CENTER): Payer: Medicare Other | Admitting: Gastroenterology

## 2021-05-24 VITALS — BP 138/86 | HR 76 | Temp 97.5°F | Resp 11 | Ht 65.0 in | Wt 167.0 lb

## 2021-05-24 DIAGNOSIS — D124 Benign neoplasm of descending colon: Secondary | ICD-10-CM | POA: Diagnosis not present

## 2021-05-24 DIAGNOSIS — E785 Hyperlipidemia, unspecified: Secondary | ICD-10-CM | POA: Diagnosis not present

## 2021-05-24 DIAGNOSIS — Z1211 Encounter for screening for malignant neoplasm of colon: Secondary | ICD-10-CM | POA: Diagnosis not present

## 2021-05-24 MED ORDER — SODIUM CHLORIDE 0.9 % IV SOLN: 500.0000 mL | INTRAVENOUS | Status: DC

## 2021-05-24 NOTE — Progress Notes (Signed)
VS by CW  I have reviewed the patient's medical history in detail and updated the computerized patient record.  

## 2021-05-24 NOTE — Patient Instructions (Signed)
YOU HAD AN ENDOSCOPIC PROCEDURE TODAY AT [facility]:   Refer to the procedure report that was given to you for any specific questions about what was found during the examination.  If the procedure report does not answer your questions, please call your gastroenterologist to clarify.  If you requested that your care partner not be given the details of your procedure findings, then the procedure report has been included in a sealed envelope for you to review at your convenience later.  YOU SHOULD EXPECT: Some feelings of bloating in the abdomen. Passage of more gas than usual.  Walking can help get rid of the air that was put into your GI tract during the procedure and reduce the bloating. If you had a lower endoscopy (such as a colonoscopy or flexible sigmoidoscopy) you may notice spotting of blood in your stool or on the toilet paper. If you underwent a bowel prep for your procedure, you may not have a normal bowel movement for a few days.  Please Note:  You might notice some irritation and congestion in your nose or some drainage.  This is from the oxygen used during your procedure.  There is no need for concern and it should clear up in a day or so.  SYMPTOMS TO REPORT IMMEDIATELY:  Following lower endoscopy (colonoscopy or flexible sigmoidoscopy):  Excessive amounts of blood in the stool  Significant tenderness or worsening of abdominal pains  Swelling of the abdomen that is new, acute  Fever of 100F or higher   For urgent or emergent issues, a gastroenterologist can be reached at any hour by calling [phone]. Do not use MyChart messaging for urgent concerns.    DIET:  We do recommend a small meal at first, but then you may proceed to your regular diet.  Drink plenty of fluids but you should avoid alcoholic beverages for 24 hours.  ACTIVITY:  You should plan to take it easy for the rest of today and you should NOT DRIVE or use heavy machinery until tomorrow (because  of the sedation medicines used during the test).    FOLLOW UP: Our staff will call the number listed on your records 48-72 hours following your procedure to check on you and address any questions or concerns that you may have regarding the information given to you following your procedure. If we do not reach you, we will leave a message.  We will attempt to reach you two times.  During this call, we will ask if you have developed any symptoms of COVID 19. If you develop any symptoms (ie: fever, flu-like symptoms, shortness of breath, cough etc.) before then, please call [phone].  If you test positive for Covid 19 in the 2 weeks post procedure, please call and report this information to [place].    If any biopsies were taken you will be contacted by phone or by letter within the next 1-3 weeks.  Please call us at [phone] if you have not heard about the biopsies in 3 weeks.    SIGNATURES/CONFIDENTIALITY: You and/or your care partner have signed paperwork which will be entered into your electronic medical record.  These signatures attest to the fact that that the information above on your After Visit Summary has been reviewed and is understood.  Full responsibility of the confidentiality of this discharge information lies with you and/or your care-partner.    Resume COUMADIN AND LOVENOX TODAY AT PRIOR DOSE. RESUME REMAINDER OF MEDICATIONS. INFORMATION GIVEN ON POLYPS.

## 2021-05-24 NOTE — Op Note (Signed)
[place] Patient Name: Amanda Vasquez Procedure Date: 05/24/2021 12:48 PM MRN: [id number] Endoscopist: Mallie Mussel L. Loletha Carrow , MD Age: 50 Referring MD:  Date of Birth: 05/22/1971 Gender: Female Account #: [id number] Procedure:                Colonoscopy Indications:              Screening for colorectal malignant neoplasm                           (poor prep on 1st screening colonoscopy 11/2020) Medicines:                Monitored Anesthesia Care Procedure:                Pre-Anesthesia Assessment:                           - Prior to the procedure, a History and Physical                            was performed, and patient medications and                            allergies were reviewed. The patient's tolerance of                            previous anesthesia was also reviewed. The risks                            and benefits of the procedure and the sedation                            options and risks were discussed with the patient.                            All questions were answered, and informed consent                            was obtained. Prior Anticoagulants: The patient                            last took Coumadin (warfarin) 4 days and Lovenox                            (enoxaparin) 1 day prior to the procedure. ASA                            Grade Assessment: III - A patient with severe                            systemic disease. After reviewing the risks and                            benefits, the patient was deemed in satisfactory  condition to undergo the procedure.                           After obtaining informed consent, the colonoscope                            was passed under direct vision. Throughout the                            procedure, the patient's blood pressure, pulse, and                            oxygen saturations were monitored continuously. The                            Olympus CF-HQ190L Colonoscope was  introduced                            through the anus and advanced to the the cecum,                            identified by appendiceal orifice and ileocecal                            valve. The colonoscopy was performed without                            difficulty. The patient tolerated the procedure                            well. The quality of the bowel preparation was fair                            and left colon, good in right colon despite                            extensive lavage. The ileocecal valve, appendiceal                            orifice, and rectum were photographed. The bowel                            preparation used was 2 day Suprep/Miralax.(chronic                            opioid therapy) Scope In: 1:02:20 PM Scope Out: 1:30:53 PM Scope Withdrawal Time: 0 hours 22 minutes 54 seconds  Total Procedure Duration: 0 hours 28 minutes 33 seconds  Findings:                 The perianal and digital rectal examinations were                            normal.  A diminutive polyp was found in the descending                            colon. The polyp was sessile. The polyp was removed                            with a cold snare. Resection and retrieval were                            complete.                           The sigmoid colon was redundant.                           The exam was otherwise without abnormality on                            direct and retroflexion views. Complications:            No immediate complications. Estimated Blood Loss:     Estimated blood loss was minimal. Impression:               - Preparation of the colon was fair.                           - One diminutive polyp in the descending colon,                            removed with a cold snare. Resected and retrieved.                           - Redundant colon.                           - The examination was otherwise normal on direct                             and retroflexion views. Recommendation:           - Patient has a contact number available for                            emergencies. The signs and symptoms of potential                            delayed complications were discussed with the                            patient. Return to normal activities tomorrow.                            Written discharge instructions were provided to the                            patient.                           -  Resume previous diet.                           - Resume Coumadin (warfarin) today and Lovenox                            (enoxaparin) today at prior doses. Refer to                            managing physician for further adjustment of                            therapy.                           - Await pathology results.                           - Repeat colonoscopy in 3 years for surveillance.                            For that exam, 2-day prep with Suprep and 4-liter                            split-dose golytely Elky Funches L. Loletha Carrow, MD 05/24/2021 1:37:10 PM This report has been signed electronically.

## 2021-05-24 NOTE — Progress Notes (Signed)
Called to room to assist during endoscopic procedure.  Patient ID and intended procedure confirmed with present staff. Received instructions for my participation in the procedure from the performing physician.  
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
## 2021-05-24 NOTE — Progress Notes (Signed)
A/ox3, pleased with MAC, report to RN 

## 2021-05-25 ENCOUNTER — Telehealth: Payer: Self-pay | Admitting: Pharmacy Technician

## 2021-05-25 ENCOUNTER — Encounter: Payer: Self-pay | Admitting: Oncology

## 2021-05-25 DIAGNOSIS — Z7901 Long term (current) use of anticoagulants: Secondary | ICD-10-CM | POA: Diagnosis not present

## 2021-05-25 DIAGNOSIS — Z596 Low income: Secondary | ICD-10-CM

## 2021-05-25 DIAGNOSIS — D6861 Antiphospholipid syndrome: Secondary | ICD-10-CM | POA: Diagnosis not present

## 2021-05-25 LAB — PROTIME-INR: INR: 1 (ref 0.9–1.1)

## 2021-05-25 NOTE — Progress Notes (Signed)
[facility])                                            [place] Team    05/25/2021  Amanda Vasquez 1971/04/10 [id number]     Received both patient and provider portion(s) of patient assistance application(s) for [place]. Faxed completed application and required documents into [employer].    [name], [name]  [phone]

## 2021-05-26 ENCOUNTER — Telehealth: Payer: Self-pay

## 2021-05-26 NOTE — Telephone Encounter (Signed)
Attempted f/u call back. No answer, left VM. 

## 2021-05-26 NOTE — Telephone Encounter (Signed)
  Follow up Call-  Call back number 05/24/2021 12/03/2020  Post procedure Call Back phone  # [phone]  Permission to leave phone message Yes Yes  Some recent data might be hidden     Patient questions:  Do you have a fever, pain , or abdominal swelling? No. Pain Score  0 *  Have you tolerated food without any problems? Yes.    Have you been able to return to your normal activities? Yes.    Do you have any questions about your discharge instructions: Diet   No. Medications  No. Follow up visit  No.  Do you have questions or concerns about your Care? No.  Actions: * If pain score is 4 or above: No action needed, pain <4.  Have you developed a fever since your procedure? no  2.   Have you had an respiratory symptoms (SOB or cough) since your procedure? no  3.   Have you tested positive for COVID 19 since your procedure no  4.   Have you had any family members/close contacts diagnosed with the COVID 19 since your procedure?  no   If yes to any of these questions please route to Joylene John, RN and Joella Prince, RN

## 2021-05-28 ENCOUNTER — Telehealth: Payer: Self-pay | Admitting: Pharmacy Technician

## 2021-05-28 NOTE — Progress Notes (Addendum)
[facility])                                            [place] Team    05/28/2021  Amanda Vasquez 06/22/1971 [id number]  Care coordination call placed to [employer] in regards to [facility] application determination. Previously had received a denial letter from the program requesting patient apply for LIS.  Spoke to [place] who informed they had the patient's household size as 4 but upon her review of the application she noticed the error and that the household size was 1 as listed on the submitted application. [name] and she informed patient was APPROVED 05/28/21-08/21/21. She process the prescription as well and informed it would be delivered to the patient's home in the next 5-10 business days so around the week of October 17-21 and will probably be delivered by [employer]PS in the patient's mailbox. Successful  outreach call placed to patient. HIPAA verified. Informed patient of her approval and informed her to disregard any notice received that she may have been denied. Informed her of the issue as outlined above. Also informed her to be expecting her shipment in her mailbox as detailed above. Patient verbalized understanding.  Kathya Wilz P. Cozetta Seif, [place]  [phone]

## 2021-05-31 ENCOUNTER — Encounter: Payer: Self-pay | Admitting: Gastroenterology

## 2021-05-31 ENCOUNTER — Encounter: Payer: Self-pay | Admitting: Obstetrics and Gynecology

## 2021-05-31 ENCOUNTER — Ambulatory Visit (INDEPENDENT_AMBULATORY_CARE_PROVIDER_SITE_OTHER): Payer: Medicare Other | Admitting: Family Medicine

## 2021-05-31 ENCOUNTER — Other Ambulatory Visit: Payer: Self-pay

## 2021-05-31 ENCOUNTER — Ambulatory Visit: Payer: Medicare Other | Admitting: Obstetrics and Gynecology

## 2021-05-31 VITALS — BP 101/68 | HR 104 | Ht 65.0 in | Wt 167.0 lb

## 2021-05-31 DIAGNOSIS — R350 Frequency of micturition: Secondary | ICD-10-CM | POA: Diagnosis not present

## 2021-05-31 DIAGNOSIS — R310 Gross hematuria: Secondary | ICD-10-CM | POA: Diagnosis not present
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
## 2021-06-01 ENCOUNTER — Encounter: Payer: Self-pay | Admitting: Obstetrics and Gynecology

## 2021-06-01 ENCOUNTER — Other Ambulatory Visit: Payer: Self-pay | Admitting: Internal Medicine

## 2021-06-01 DIAGNOSIS — G894 Chronic pain syndrome: Secondary | ICD-10-CM | POA: Diagnosis not present

## 2021-06-01 DIAGNOSIS — G890 Central pain syndrome: Secondary | ICD-10-CM | POA: Diagnosis not present

## 2021-06-01 DIAGNOSIS — F32A Depression, unspecified: Secondary | ICD-10-CM

## 2021-06-01 DIAGNOSIS — Z79891 Long term (current) use of opiate analgesic: Secondary | ICD-10-CM | POA: Diagnosis not present

## 2021-06-01 DIAGNOSIS — M62830 Muscle spasm of back: Secondary | ICD-10-CM | POA: Diagnosis not present

## 2021-06-01 LAB — PROTIME-INR: INR: 3.4 — AB (ref 0.9–1.1)

## 2021-06-02 ENCOUNTER — Encounter: Payer: Self-pay | Admitting: Oncology

## 2021-06-04 ENCOUNTER — Telehealth: Payer: Self-pay

## 2021-06-04 NOTE — Telephone Encounter (Signed)
Informed pt of INR being 3.4 and per Dr. Janese Banks INR will be re-checked in one week and to keep the same dose of medication. Pt understands and agrees.

## 2021-06-08 LAB — PROTIME-INR: INR: 3.2 — AB (ref 0.9–1.1)

## 2021-06-09 ENCOUNTER — Encounter: Payer: Self-pay | Admitting: Oncology

## 2021-06-15 LAB — PROTIME-INR: INR: 3.2 — AB (ref 0.9–1.1)

## 2021-06-16 ENCOUNTER — Telehealth: Payer: Self-pay | Admitting: Neurology

## 2021-06-16 NOTE — Telephone Encounter (Signed)
Patient has a Botox appointment 11/1. I submitted PA request via [place] portal for 155 units of Botox for G43.711, every 12 weeks. Request was approved.   PA #[id number] (06/16/21- 06/16/22)  PA phone: [phone]

## 2021-06-17 ENCOUNTER — Encounter: Payer: Self-pay | Admitting: Oncology

## 2021-06-17 ENCOUNTER — Encounter: Payer: Self-pay | Admitting: *Deleted

## 2021-06-22 ENCOUNTER — Ambulatory Visit: Payer: Medicare Other | Admitting: Neurology

## 2021-06-22 ENCOUNTER — Encounter: Payer: Self-pay | Admitting: Oncology

## 2021-06-22 ENCOUNTER — Telehealth: Payer: Self-pay | Admitting: *Deleted

## 2021-06-22 ENCOUNTER — Other Ambulatory Visit: Payer: Self-pay

## 2021-06-22 DIAGNOSIS — G43019 Migraine without aura, intractable, without status migrainosus: Secondary | ICD-10-CM

## 2021-06-22 DIAGNOSIS — D6861 Antiphospholipid syndrome: Secondary | ICD-10-CM | POA: Diagnosis not present

## 2021-06-22 DIAGNOSIS — G43711 Chronic migraine without aura, intractable, with status migrainosus: Secondary | ICD-10-CM | POA: Diagnosis not present

## 2021-06-22 DIAGNOSIS — Z7901 Long term (current) use of anticoagulants: Secondary | ICD-10-CM | POA: Diagnosis not present

## 2021-06-22 LAB — PROTIME-INR: INR: 1.2 — AB (ref 0.9–1.1)

## 2021-06-22 MED ORDER — NURTEC 75 MG PO TBDP: 75.0000 mg | ORAL_TABLET | Freq: Every day | ORAL | 6 refills | Status: AC | PRN

## 2021-06-22 MED ORDER — CYCLOBENZAPRINE HCL 10 MG PO TABS: 10.0000 mg | ORAL_TABLET | Freq: Every day | ORAL | 3 refills | Status: AC

## 2021-06-22 MED ORDER — METHYLPREDNISOLONE 4 MG PO TBPK: ORAL_TABLET | ORAL | 1 refills | Status: DC

## 2021-06-22 NOTE — Progress Notes (Addendum)
Consent Form Botulism Toxin Injection For Chronic Migraine   06/22/2021: Continues to do extremely well > 75% decrease freq headaches. +a. Gave some flexeril and nurtec. If she needs more botox, she can give [place] a call and I will mix up some samples for her right shoulder. Has 4 migraine days a month and 4 total headache days a month.   Reviewed orally with patient, additionally signature is on file:  Botulism toxin has been approved by the Federal drug administration for treatment of chronic migraine. Botulism toxin does not cure chronic migraine and it may not be effective in some patients.  The administration of botulism toxin is accomplished by injecting a small amount of toxin into the muscles of the neck and head. Dosage must be titrated for each individual. Any benefits resulting from botulism toxin tend to wear off after 3 months with a repeat injection required if benefit is to be maintained. Injections are usually done every 3-4 months with maximum effect peak achieved by about 2 or 3 weeks. Botulism toxin is expensive and you should be sure of what costs you will incur resulting from the injection.  The side effects of botulism toxin use for chronic migraine may include:   -Transient, and usually mild, facial weakness with facial injections  -Transient, and usually mild, head or neck weakness with head/neck injections  -Reduction or loss of forehead facial animation due to forehead muscle weakness  -Eyelid drooping  -Dry eye  -Pain at the site of injection or bruising at the site of injection  -Double vision  -Potential unknown long term risks  Contraindications: You should not have Botox if you are pregnant, nursing, allergic to albumin, have an infection, skin condition, or muscle weakness at the site of the injection, or have myasthenia gravis, Lambert-Eaton syndrome, or ALS.  It is also possible that as with any injection, there may be an allergic reaction or no effect from  the medication. Reduced effectiveness after repeated injections is sometimes seen and rarely infection at the injection site may occur. All care will be taken to prevent these side effects. If therapy is given over a long time, atrophy and wasting in the muscle injected may occur. Occasionally the patient's become refractory to treatment because they develop antibodies to the toxin. In this event, therapy needs to be modified.  I have read the above information and consent to the administration of botulism toxin.    BOTOX PROCEDURE NOTE FOR MIGRAINE HEADACHE    Contraindications and precautions discussed with patient(above). Aseptic procedure was observed and patient tolerated procedure. Procedure performed by Dr. Georgia Dom  The condition has existed for more than 6 months, and pt does not have a diagnosis of ALS, Myasthenia Gravis or Lambert-Eaton Syndrome.  Risks and benefits of injections discussed and pt agrees to proceed with the procedure.  Written consent obtained  These injections are medically necessary. Pt  receives good benefits from these injections. These injections do not cause sedations or hallucinations which the oral therapies may cause.  Description of procedure:  The patient was placed in a sitting position. The standard protocol was used for Botox as follows, with 5 units of Botox injected at each site:   -Procerus muscle, midline injection  -Corrugator muscle, bilateral injection  -Frontalis muscle, bilateral injection, with 2 sites each side, medial injection was performed in the upper one third of the frontalis muscle, in the region vertical from the medial inferior edge of the superior orbital rim. The lateral  injection was again in the upper one third of the forehead vertically above the lateral limbus of the cornea, 1.5 cm lateral to the medial injection site.    -Temporalis muscle injection, 5 sites, bilaterally. The first injection was 3 cm above the tragus  of the ear, second injection site was 1.5 cm to 3 cm up from the first injection site in line with the tragus of the ear. The third injection site was 1.5-3 cm forward between the first 2 injection sites. The fourth injection site was 1.5 cm posterior to the second injection site.   -Occipitalis muscle injection, 3 sites, bilaterally. The first injection was done one half way between the occipital protuberance and the tip of the mastoid process behind the ear. The second injection site was done lateral and superior to the first, 1 fingerbreadth from the first injection. The third injection site was 1 fingerbreadth superiorly and medially from the first injection site.  -Cervical paraspinal muscle injection, 2 sites, bilateral knee first injection site was 1 cm from the midline of the cervical spine, 3 cm inferior to the lower border of the occipital protuberance. The second injection site was 1.5 cm superiorly and laterally to the first injection site.  -Trapezius muscle injection was performed at 3 sites, bilaterally. The first injection site was in the upper trapezius muscle halfway between the inflection point of the neck, and the acromion. The second injection site was one half way between the acromion and the first injection site. The third injection was done between the first injection site and the inflection point of the neck.   Will return for repeat injection in 3 months.   155 unit sof Botox was used, 45U Botox not injected was wasted. The patient tolerated the procedure well, there were no complications of the above procedure.

## 2021-06-22 NOTE — Progress Notes (Signed)
Botox- 100 units x 2 vials Lot: [id number] Expiration: 06/2023 NDC: 0221-7981-02  Bacteriostatic 0.9% Sodium Chloride- 30mL total Lot: [id number] Expiration: 08/22/2022 NDC: 2417-5301-04  Dx: U45.913 B/B

## 2021-06-22 NOTE — Telephone Encounter (Signed)
-----   Message from Sindy Guadeloupe, MD sent at 06/22/2021  3:17 PM EDT ----- Can you talk to her? Why is her inr low suddenly?

## 2021-06-22 NOTE — Telephone Encounter (Signed)
Called pt and let her know that level was low and she states she took 10 mg every day - no missing of dates. No changes with the greens she ate this week. Per Janese Banks she would like pt to check level  Friday and let us know. If it does not get better we will need to inc. Coumadin. Pt aware and will check level friday

## 2021-06-22 NOTE — Telephone Encounter (Signed)
Amanda Vasquez (KeySharlyne Cai) Rx #: [id number] Nurtec 75MG  dispersible tablets  PA for Nurtec has been sent waiting on approval

## 2021-06-23 NOTE — Telephone Encounter (Signed)
Approval good thru 08-21-2022 for Nurtec. Medicare Part D benefit.

## 2021-06-23 NOTE — Telephone Encounter (Signed)
Amanda Vasquez (Amanda Vasquez) Rx #: [id number] Nurtec 75MG  dispersible tablets  PA Nurtec was approved will contact patient through mychart to make her aware.

## 2021-06-25 ENCOUNTER — Ambulatory Visit (INDEPENDENT_AMBULATORY_CARE_PROVIDER_SITE_OTHER): Payer: Medicare Other | Admitting: Obstetrics and Gynecology

## 2021-06-25 ENCOUNTER — Other Ambulatory Visit: Payer: Self-pay

## 2021-06-25 ENCOUNTER — Encounter (INDEPENDENT_AMBULATORY_CARE_PROVIDER_SITE_OTHER): Payer: Self-pay | Admitting: Family Medicine

## 2021-06-25 ENCOUNTER — Encounter: Payer: Self-pay | Admitting: Obstetrics and Gynecology

## 2021-06-25 VITALS — BP 143/89 | HR 89 | Wt 167.0 lb

## 2021-06-25 DIAGNOSIS — R350 Frequency of micturition: Secondary | ICD-10-CM

## 2021-06-25 DIAGNOSIS — R310 Gross hematuria: Secondary | ICD-10-CM

## 2021-06-25 LAB — POCT URINALYSIS DIPSTICK
Bilirubin, UA: NEGATIVE
Blood, UA: NEGATIVE
Glucose, UA: POSITIVE — AB
Ketones, UA: NEGATIVE
Leukocytes, UA: NEGATIVE
Nitrite, UA: NEGATIVE
Protein, UA: NEGATIVE
Spec Grav, UA: 1.02 (ref 1.010–1.025)
Urobilinogen, UA: 0.2 E.U./dL
pH, UA: 7 (ref 5.0–8.0)

## 2021-06-25 NOTE — Progress Notes (Signed)
CYSTOSCOPY  CC:  This is a 50 y.o. with gross hematuria who presents today for cystoscopy.  POC urine: negative  BP (!) 143/89   Pulse 89   Wt 167 lb (75.8 kg)   LMP 03/05/2018 (Exact Date) Comment: last week, no chance of pregnancy  BMI 27.79 kg/m   CYSTOSCOPY: A time out was performed.  The periurethral area was prepped and draped in a sterile manner.  2% lidocaine jetpack was inserted at the urethral meatus and the urethra and bladder visualized with 0- and 70-degree scopes.  She had normal urethral coaptation and normal urethral mucosa.  She had normal bladder mucosa. She had bilateral clear efflux from both ureteral orifices.  She had no squamous metaplasia at the trigone, no trabeculations, cellules or diverticuli.     ASSESSMENT:  51 y.o. with gross hematuria. Cystoscopy today is normal.  PLAN:  No further follow up required- prior CT scan normal of GU system.  Discussed need for repeat UA in 1 year  All questions answered and post-procedures instructions were given  Jaquita Folds, MD

## 2021-06-25 NOTE — Patient Instructions (Signed)

## 2021-06-25 NOTE — Addendum Note (Signed)
Addended by: Elita Quick on: 06/25/2021 04:06 PM   Modules accepted: Orders

## 2021-06-28 ENCOUNTER — Ambulatory Visit (INDEPENDENT_AMBULATORY_CARE_PROVIDER_SITE_OTHER): Payer: Medicare Other | Admitting: Family Medicine

## 2021-06-29 DIAGNOSIS — G890 Central pain syndrome: Secondary | ICD-10-CM | POA: Diagnosis not present

## 2021-06-29 DIAGNOSIS — Z79891 Long term (current) use of opiate analgesic: Secondary | ICD-10-CM | POA: Diagnosis not present

## 2021-06-29 DIAGNOSIS — M62830 Muscle spasm of back: Secondary | ICD-10-CM | POA: Diagnosis not present

## 2021-06-29 DIAGNOSIS — G894 Chronic pain syndrome: Secondary | ICD-10-CM | POA: Diagnosis not present

## 2021-06-29 LAB — PROTIME-INR: INR: 2.5 — AB (ref 0.9–1.1)

## 2021-06-30 ENCOUNTER — Encounter: Payer: Self-pay | Admitting: Oncology

## 2021-06-30 LAB — PROTIME-INR: INR: 4.7 — AB (ref 0.9–1.1)

## 2021-07-07 ENCOUNTER — Encounter: Payer: Self-pay | Admitting: Oncology

## 2021-07-07 LAB — PROTIME-INR: INR: 4.5 — AB (ref 0.9–1.1)

## 2021-07-09 DIAGNOSIS — H6691 Otitis media, unspecified, right ear: Secondary | ICD-10-CM | POA: Diagnosis not present

## 2021-07-09 LAB — PROTIME-INR: INR: 3.5 — AB (ref 0.9–1.1)

## 2021-07-12 ENCOUNTER — Encounter: Payer: Self-pay | Admitting: Oncology

## 2021-07-13 ENCOUNTER — Encounter: Payer: Self-pay | Admitting: Oncology

## 2021-07-13 LAB — PROTIME-INR: INR: 3.9 — AB (ref 0.9–1.1)

## 2021-07-21 ENCOUNTER — Encounter: Payer: Self-pay | Admitting: Oncology

## 2021-07-21 DIAGNOSIS — D6861 Antiphospholipid syndrome: Secondary | ICD-10-CM | POA: Diagnosis not present

## 2021-07-21 DIAGNOSIS — Z7901 Long term (current) use of anticoagulants: Secondary | ICD-10-CM | POA: Diagnosis not present

## 2021-07-21 LAB — PROTIME-INR: INR: 4.7 — AB (ref 0.9–1.1)

## 2021-07-29 ENCOUNTER — Encounter: Payer: Self-pay | Admitting: *Deleted

## 2021-08-03 ENCOUNTER — Encounter: Payer: Self-pay | Admitting: *Deleted

## 2021-08-03 ENCOUNTER — Encounter: Payer: Self-pay | Admitting: Oncology

## 2021-08-03 LAB — PROTIME-INR: INR: 3.8 — AB (ref 0.9–1.1)

## 2021-08-04 ENCOUNTER — Ambulatory Visit: Payer: Medicare Other

## 2021-08-04 ENCOUNTER — Ambulatory Visit (INDEPENDENT_AMBULATORY_CARE_PROVIDER_SITE_OTHER): Payer: Medicare Other | Admitting: Internal Medicine

## 2021-08-04 ENCOUNTER — Other Ambulatory Visit: Payer: Self-pay

## 2021-08-04 ENCOUNTER — Encounter: Payer: Self-pay | Admitting: *Deleted

## 2021-08-04 VITALS — BP 124/78 | HR 86 | Temp 97.7°F | Ht 65.0 in | Wt 197.0 lb

## 2021-08-04 DIAGNOSIS — R1114 Bilious vomiting: Secondary | ICD-10-CM

## 2021-08-04 DIAGNOSIS — E119 Type 2 diabetes mellitus without complications: Secondary | ICD-10-CM

## 2021-08-04 DIAGNOSIS — Z1329 Encounter for screening for other suspected endocrine disorder: Secondary | ICD-10-CM

## 2021-08-04 DIAGNOSIS — F419 Anxiety disorder, unspecified: Secondary | ICD-10-CM | POA: Diagnosis not present

## 2021-08-04 DIAGNOSIS — Z23 Encounter for immunization: Secondary | ICD-10-CM

## 2021-08-04 DIAGNOSIS — E538 Deficiency of other specified B group vitamins: Secondary | ICD-10-CM

## 2021-08-04 DIAGNOSIS — F32A Depression, unspecified: Secondary | ICD-10-CM

## 2021-08-04 MED ORDER — PROMETHAZINE HCL 25 MG PO TABS: 25.0000 mg | ORAL_TABLET | Freq: Two times a day (BID) | ORAL | 5 refills | Status: DC | PRN

## 2021-08-04 MED ORDER — ALPRAZOLAM 0.5 MG PO TABS: 0.5000 mg | ORAL_TABLET | Freq: Every day | ORAL | 5 refills | Status: DC | PRN

## 2021-08-04 NOTE — Telephone Encounter (Signed)
error 
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
## 2021-08-04 NOTE — Patient Instructions (Signed)
Pneumococcal Conjugate Vaccine (Prevnar 13) Suspension for Injection What is this medication? PNEUMOCOCCAL VACCINE (NEU mo KOK al vak SEEN) is a vaccine used to prevent pneumococcus bacterial infections. These bacteria can cause serious infections like pneumonia, meningitis, and blood infections. This vaccine will lower your chance of getting pneumonia. If you do get pneumonia, it can make your symptoms milder and your illness shorter. This vaccine will not treat an infection and will not cause infection. This vaccine is recommended for infants and young children, adults with certain medical conditions, and adults 52 years or older. This medicine may be used for other purposes; ask your health care provider or pharmacist if you have questions. COMMON BRAND NAME(S): Prevnar, Prevnar 13 What should I tell my care team before I take this medication? They need to know if you have any of these conditions: bleeding problems fever immune system problems an unusual or allergic reaction to pneumococcal vaccine, diphtheria toxoid, other vaccines, latex, other medicines, foods, dyes, or preservatives pregnant or trying to get pregnant breast-feeding How should I use this medication? This vaccine is for injection into a muscle. It is given by a health care professional. A copy of Vaccine Information Statements will be given before each vaccination. Read this sheet carefully each time. The sheet may change frequently. Talk to your pediatrician regarding the use of this medicine in children. While this drug may be prescribed for children as young as 44 weeks old for selected conditions, precautions do apply. Overdosage: If you think you have taken too much of this medicine contact a poison control center or emergency room at once. NOTE: This medicine is only for you. Do not share this medicine with others. What if I miss a dose? It is important not to miss your dose. Call your doctor or health care professional  if you are unable to keep an appointment. What may interact with this medication? medicines for cancer chemotherapy medicines that suppress your immune function steroid medicines like prednisone or cortisone This list may not describe all possible interactions. Give your health care provider a list of all the medicines, herbs, non-prescription drugs, or dietary supplements you use. Also tell them if you smoke, drink alcohol, or use illegal drugs. Some items may interact with your medicine. What should I watch for while using this medication? Mild fever and pain should go away in 3 days or less. Report any unusual symptoms to your doctor or health care professional. What side effects may I notice from receiving this medication? Side effects that you should report to your doctor or health care professional as soon as possible: allergic reactions like skin rash, itching or hives, swelling of the face, lips, or tongue breathing problems confused fast or irregular heartbeat fever over 102 degrees F seizures unusual bleeding or bruising unusual muscle weakness Side effects that usually do not require medical attention (report to your doctor or health care professional if they continue or are bothersome): aches and pains diarrhea fever of 102 degrees F or less headache irritable loss of appetite pain, tender at site where injected trouble sleeping This list may not describe all possible side effects. Call your doctor for medical advice about side effects. You may report side effects to FDA at 1-800-FDA-1088. Where should I keep my medication? This does not apply. This vaccine is given in a clinic, pharmacy, doctor's office, or other health care setting and will not be stored at home. NOTE: This sheet is a summary. It may not cover all possible  information. If you have questions about this medicine, talk to your doctor, pharmacist, or health care provider.  2022 Elsevier/Gold Standard  (2014-05-15 00:00:00)

## 2021-08-04 NOTE — Addendum Note (Signed)
Addended by: Thressa Sheller on: 08/04/2021 01:51 PM   Modules accepted: Orders

## 2021-08-09 ENCOUNTER — Other Ambulatory Visit: Payer: Medicare Other

## 2021-08-10 LAB — PROTIME-INR: INR: 5.3 — AB (ref 0.9–1.1)

## 2021-08-11 ENCOUNTER — Ambulatory Visit (INDEPENDENT_AMBULATORY_CARE_PROVIDER_SITE_OTHER): Payer: Medicare Other | Admitting: Pharmacist

## 2021-08-11 ENCOUNTER — Encounter: Payer: Self-pay | Admitting: Oncology

## 2021-08-11 DIAGNOSIS — E1169 Type 2 diabetes mellitus with other specified complication: Secondary | ICD-10-CM

## 2021-08-11 DIAGNOSIS — I700 Atherosclerosis of aorta: Secondary | ICD-10-CM

## 2021-08-11 DIAGNOSIS — E119 Type 2 diabetes mellitus without complications: Secondary | ICD-10-CM

## 2021-08-11 DIAGNOSIS — G43711 Chronic migraine without aura, intractable, with status migrainosus: Secondary | ICD-10-CM

## 2021-08-11 DIAGNOSIS — E785 Hyperlipidemia, unspecified: Secondary | ICD-10-CM

## 2021-08-11 MED ORDER — DAPAGLIFLOZIN PROPANEDIOL 10 MG PO TABS: 10.0000 mg | ORAL_TABLET | Freq: Every day | ORAL | 3 refills | Status: DC

## 2021-08-11 NOTE — Chronic Care Management (AMB) (Signed)
Chronic Care Management CCM Pharmacy Note  08/11/2021 Name:  Amanda Vasquez MRN:  [id number] DOB:  1970-12-03  Summary: - Tolerating regimen well. Appreciates patient assistance  Recommendations/Changes made from today's visit: - Will collaborate with patient and providers to reapply for patient assistance for 2023 for [place], Farxiga, and Ozempic  Subjective: Amanda Vasquez is an 50 y.o. year old female who is a primary patient of McLean-Scocuzza, Nino Glow, MD.  The CCM team was consulted for assistance with disease management and care coordination needs.    Engaged with patient by telephone for follow up visit for pharmacy case management and/or care coordination services.   Objective:  Medications Reviewed Today     Reviewed by De Hollingshead, RPH-CPP (Pharmacist) on 08/11/21 at 1426  Med List Status: <None>   Medication Order Taking? Sig Documenting Provider Last Dose Status Informant  ALPRAZolam (XANAX) 0.5 MG tablet [id number] Yes Take 1 tablet (0.5 mg total) by mouth daily as needed. for anxiety McLean-Scocuzza, Nino Glow, MD Taking Active   atorvastatin (LIPITOR) 40 MG tablet [id number] Yes Take 1 tablet (40 mg total) by mouth daily at 6 PM. McLean-Scocuzza, Nino Glow, MD Taking Active   baclofen (LIORESAL) 10 MG tablet [id number] Yes Take 10 mg by mouth 2 (two) times daily.  Rowe Clack, MD Taking Active Self           Med Note De Hollingshead   Tue Apr 06, 2021  3:33 PM)    Botulinum Toxin Type A (BOTOX) 200 units SOLR [id number] Yes Provider to inject 155 units into the muscles of the head and neck every 3 months. Discard remainder. Melvenia Beam, MD Taking Active   buPROPion (WELLBUTRIN XL) 300 MG 24 hr tablet [id number] Yes TAKE ONE TABLET BY MOUTH EVERY DAY McLean-Scocuzza, Nino Glow, MD Taking Active   calcium carbonate (OSCAL) 1500 (600 Ca) MG TABS tablet [id number] Yes Take by mouth daily with breakfast. [provider] Taking Active   Cetirizine  HCl 10 MG CAPS [id number] Yes Take 1 capsule (10 mg total) by mouth daily as needed. Take by mouth. Rowe Clack, MD Taking Active Self  Cholecalciferol (VITAMIN D) 50 MCG (2000 UT) CAPS [id number] Yes Take 2,000 Units by mouth daily. [provider] Taking Active   cyclobenzaprine (FLEXERIL) 10 MG tablet [id number] Yes Take 1 tablet (10 mg total) by mouth at bedtime. Melvenia Beam, MD Taking Active   dapagliflozin propanediol (FARXIGA) 10 MG TABS tablet [id number] Yes Take 1 tablet (10 mg total) by mouth daily before breakfast. Leone Haven, MD Taking Active   enoxaparin (LOVENOX) 100 MG/ML injection [id number] No Inject 1 mL (100 mg total) into the skin every 12 (twelve) hours. Patient will need to  give 0.75 lovenox  dose q 12hours 2 days before colonoscopy, and start it back the evening of the procedure and continue the inj. Every 12 hours until coumadin level gets to 3.  Patient not taking: Reported on 08/11/2021   Sindy Guadeloupe, MD Not Taking Active   Galcanezumab-gnlm [facility]) 120 MG/ML Darden Palmer [id number] Yes Inject 120 mg into the skin every 30 (thirty) days. [provider] Taking Active   GRALISE 600 MG TABS [id number] Yes Take 1,800 mg by mouth every evening.  [provider] Taking Active Self           Med Note Tawni Carnes Oct 02, 2017  7:28 PM)    metFORMIN (GLUCOPHAGE) 1000 MG tablet [id number]  Yes Take 1 tablet (1,000 mg total) by mouth 2 (two) times daily with a meal. McLean-Scocuzza, Nino Glow, MD Taking Active   morphine (MS CONTIN) 60 MG 12 hr tablet [id number] Yes Take 60 mg by mouth every 12 (twelve) hours. [provider] Taking Active Self  Multiple Vitamin (MULTIVITAMIN) tablet [id number] Yes Take 1 tablet by mouth daily. [provider] Taking Active   naloxegol oxalate (MOVANTIK) 25 MG TABS tablet [id number] Yes Take 25 mg by mouth daily. [provider] Taking Active   Baruch Gouty [id number] Yes CBD Gummies  [provider] Taking Active   oxyCODONE (OXY IR/ROXICODONE) 5 MG immediate release tablet [id number] Yes Take 1 tablet (5 mg total) by mouth every 4 (four) hours as needed for severe pain. Per pain clinic Dr Wylie Hail, Jannifer Rodney, MD Taking Active Self           Med Note Tyrone Sage May 13, 2021 11:25 AM)    Polyethylene Glycol 3350 (MIRALAX PO) [id number] Yes Take by mouth as needed. [provider] Taking Active Self  promethazine (PHENERGAN) 25 MG tablet [id number] Yes Take 1 tablet (25 mg total) by mouth 2 (two) times daily as needed for nausea or vomiting. McLean-Scocuzza, Nino Glow, MD Taking Active   propranolol ER (INDERAL LA) 80 MG 24 hr capsule [id number] Yes Take 1 capsule (80 mg total) by mouth daily. McLean-Scocuzza, Nino Glow, MD Taking Active   Rimegepant Sulfate (NURTEC) 75 MG TBDP [id number] Yes Take 75 mg by mouth daily as needed. For migraines. Take as close to onset of migraine as possible. One daily maximum. Melvenia Beam, MD Taking Active   Semaglutide, 1 MG/DOSE, 4 MG/3ML Bonney Aid [id number] Yes Inject 1 mg as directed once a week. [place], Joneen Boers, FNP Taking Active   sertraline (ZOLOFT) 100 MG tablet [id number] Yes TAKE 1.5 TABLETS BY MOUTH DAILY. McLean-Scocuzza, Nino Glow, MD Taking Active   traZODone (DESYREL) 100 MG tablet [id number] Yes Take 1 tablet (100 mg total) by mouth at bedtime as needed. for sleep McLean-Scocuzza, Nino Glow, MD Taking Active   warfarin (COUMADIN) 10 MG tablet [id number] Yes Take 1 tablet (10 mg total) by mouth daily. Sindy Guadeloupe, MD Taking Active             Pertinent Labs:   Lab Results  Component Value Date   HGBA1C 6.4 (H) 10/21/2020   Lab Results  Component Value Date   CHOL 113 10/21/2020   HDL 41 10/21/2020   LDLCALC 41 10/21/2020   LDLDIRECT 80.0 09/19/2018   TRIG 192 (H) 10/21/2020   CHOLHDL 2 01/21/2020   Lab Results  Component Value Date   CREATININE 0.79 02/13/2021   BUN 17 02/13/2021    NA 141 02/13/2021   K 4.6 02/13/2021   CL 105 02/13/2021   CO2 29 02/13/2021    SDOH:  (Social Determinants of Health) assessments and interventions performed:  SDOH Interventions    Flowsheet Row Most Recent Value  SDOH Interventions   Financial Strain Interventions Other (Comment)  [manufacturer assistance]       CCM Care Plan  Review of patient past medical history, allergies, medications, health status, including review of consultants reports, laboratory and other test data, was performed as part of comprehensive evaluation and provision of chronic care management services.   Care Plan : Medication Management  Updates made by De Hollingshead, RPH-CPP since 08/11/2021 12:00 AM     Problem: Diabetes, Obesity, Chronic Pain, APL  hx VTE      Long-Range Goal: Disease Progression Prevention   Start Date: 09/22/2020  Recent Progress: On track  Priority: High  Note:   Current Barriers:  Unable to independently afford treatment regimen Complex patient with multiple comorbidities that increase risk of hospitalization, including depression and anxiety, chronic pain, migraines, periodic nausea and vomiting, gastroparesis, antiphospholipid antibody  Pharmacist Clinical Goal(s):  Over the next 90 days, patient will achieve adherence to monitoring guidelines and medication adherence to achieve therapeutic efficacy through collaboration with PharmD and provider.   Interventions: 1:1 collaboration with McLean-Scocuzza, Nino Glow, MD regarding development and update of comprehensive plan of care as evidenced by provider attestation and co-signature Inter-disciplinary care team collaboration (see longitudinal plan of care) Comprehensive medication review performed; medication list updated in electronic medical record  Health Maintenance   Yearly diabetic eye exam: up to date Yearly diabetic foot exam: up to date Urine microalbumin: up to date Yearly influenza vaccination: up to  date Td/Tdap vaccination: up to date Pneumonia vaccination: up to date COVID vaccinations: up to date Shingrix vaccinations: up to date Colonoscopy: up to date Mammogram: due - though patient reports this was completed by GYN on 8/19 along with PAP. Asked her to ask that office to send [place] the results   Diabetes/Obesity: Controlled; current treatment: metformin 1000 mg BID, Farxiga 10 mg daily, Ozempic 1 mg weekly; follows Weight Management Reports she put on 15 lbs with methylprednisolone dose pack. Somewhat discouraged.  Reports tolerability of current regimen Will collaborate w/ CPhT, patient, and providers to reapply for patient assistance for Franne Grip for 2023. Recommended to continue current regimen at this time  Hyperlipidemia and ASCVD risk reduction: Controlled per last lipid panel; current treatment: atorvastatin 40 mg daily Previously recommended to continue current regimen at this time  Depression/Anxiety: Controlled per patient report; current treatment: alprazolam 0.5 mg daily PRN (reports infrequent use) bupropion XL 300 mg daily, sertraline 150 mg daily, trazodone 150 mg QPM Previously recommended to continue current regimen at this time  Chronic Pain: Managed per Dr. Eartha Inch [facility] in [place]; morphine CR 60 mg BID, oxycodone 5 mg Q4H PRN, baclofen 10 mg BID, gabapentin ER (Gralise) 600 mg daily; Movantik 25 mg PRN - reports she receives samples of this medication. No assistance available for patients with insurance  Previously recommended to continue current regimen at this time along with collaboration with pain management  Antiphospholipid antibody syndrome: Appropriately managed; current treatment: warfarin 10 mg; managed by hem/onc Dr. Janese Banks Home INR monitor, communicates w/ team via MyChart;  Previously recommended to continue current regimen at this time along with collaboration with hematology.   Chronic Migraines: Controlled per patient  report; current treatment: Emgality 120 mg monthly , Botox Q3 months, Nurtec 75 mg PRN; propranolol 80 mg daily; follows w/ Dr Jaynee Eagles Approved for [facility] assistance through 08/21/21.  Will collaborate w/ CPhT, patient, and providers to reapply for patient assistance for [facility] for 2023.  Patient Goals/Self-Care Activities Over the next 90 days, patient will:  - take medications as prescribed check glucose daily, document, and provide at future appointments collaborate with provider on medication access solutions target a minimum of 150 minutes of moderate intensity exercise weekly engage in dietary modifications by following eating plan prescribed by Weight Management      Plan: Telephone follow up appointment with care management team member scheduled for:  PRN pending patient needs  Catie Darnelle Maffucci, PharmD, [place], [place] at [employer] [phone]

## 2021-08-11 NOTE — Patient Instructions (Signed)
Amanda Vasquez,   We will mail you the patient portions of the assistance applications for Emgality and Ozempic. We do not need to do anything to re-enroll for assistance for Farxiga.   Let us know if you need anything!  Catie Darnelle Maffucci, PharmD  Visit Information  Following are the goals we discussed today:  Patient Goals/Self-Care Activities Over the next 90 days, patient will:  - take medications as prescribed check glucose daily, document, and provide at future appointments collaborate with provider on medication access solutions target a minimum of 150 minutes of moderate intensity exercise weekly engage in dietary modifications by following eating plan prescribed by Weight Management        Plan: Telephone follow up appointment with care management team member scheduled for:  PRN pending patient needs   Catie Darnelle Maffucci, PharmD, BCACP, CPP Clinical Pharmacist [place] at [facility] [phone]     Please call the care guide team at [phone] if you need to cancel or reschedule your appointment.   Patient verbalizes understanding of instructions provided today and agrees to view in [place].

## 2021-08-16 ENCOUNTER — Encounter: Payer: Self-pay | Admitting: *Deleted

## 2021-08-17 ENCOUNTER — Encounter: Payer: Self-pay | Admitting: Internal Medicine

## 2021-08-18 ENCOUNTER — Encounter: Payer: Self-pay | Admitting: *Deleted

## 2021-08-18 ENCOUNTER — Encounter: Payer: Self-pay | Admitting: Oncology

## 2021-08-18 LAB — PROTIME-INR: INR: 2.6 — AB (ref 0.9–1.1)

## 2021-08-19 ENCOUNTER — Other Ambulatory Visit: Payer: Self-pay | Admitting: Internal Medicine

## 2021-08-19 ENCOUNTER — Encounter: Payer: Self-pay | Admitting: Internal Medicine

## 2021-08-19 DIAGNOSIS — B001 Herpesviral vesicular dermatitis: Secondary | ICD-10-CM

## 2021-08-19 MED ORDER — VALACYCLOVIR HCL 1 G PO TABS
1000.0000 mg | ORAL_TABLET | Freq: Two times a day (BID) | ORAL | 2 refills | Status: DC
Start: 2021-08-19 — End: 2023-07-05

## 2021-08-19 NOTE — Telephone Encounter (Signed)
Okay to send in? Patient has historical diagnosis of HSV in her chart

## 2021-08-21 DIAGNOSIS — E1169 Type 2 diabetes mellitus with other specified complication: Secondary | ICD-10-CM

## 2021-08-21 DIAGNOSIS — E119 Type 2 diabetes mellitus without complications: Secondary | ICD-10-CM | POA: Diagnosis not present

## 2021-08-21 DIAGNOSIS — E785 Hyperlipidemia, unspecified: Secondary | ICD-10-CM

## 2021-08-25 ENCOUNTER — Telehealth: Payer: Self-pay | Admitting: Pharmacy Technician

## 2021-08-25 DIAGNOSIS — Z596 Low income: Secondary | ICD-10-CM

## 2021-08-25 LAB — PROTIME-INR: INR: 3.7 — AB (ref 0.9–1.1)

## 2021-08-25 NOTE — Progress Notes (Signed)
[facility])                                            [place] Team    08/25/2021  Amanda Vasquez [birth year] [id number]                                      Medication Assistance Referral  Referral From: [place] Catie T.   Medication/Company: Wilder [employer]ade / [place] Patient application portion: patient should be automatically re enrolled per [place] guidelines as patient was approved in 2022 and has Med D Provider application portion: Interoffice Mailed to Dr. Terese Door Provider address/fax verified via: Office website  Medication/Company: Larna Daughters / [employer] Patient application portion:  Education officer, museum portion: Interoffice Mailed to Dr. Terese Door Provider address/fax verified via: Office website  Medication/Company: [employer] [place] Patient application portion:  Mailed Provider application portion: Faxed  to Dr. Caroll Rancher Provider address/fax verified via: Office website  Received provider portion(s) of patient assistance application(s) for [place]. Embedded PharmD escribed prescription into [place].    Dov Dill P. Florentino Laabs, [place]  [phone]

## 2021-08-26 DIAGNOSIS — Z79891 Long term (current) use of opiate analgesic: Secondary | ICD-10-CM | POA: Diagnosis not present

## 2021-08-26 DIAGNOSIS — M62830 Muscle spasm of back: Secondary | ICD-10-CM | POA: Diagnosis not present

## 2021-08-26 DIAGNOSIS — G894 Chronic pain syndrome: Secondary | ICD-10-CM | POA: Diagnosis not present

## 2021-08-26 DIAGNOSIS — G890 Central pain syndrome: Secondary | ICD-10-CM | POA: Diagnosis not present

## 2021-08-27 ENCOUNTER — Encounter: Payer: Self-pay | Admitting: Oncology

## 2021-08-27 ENCOUNTER — Encounter: Payer: Self-pay | Admitting: *Deleted

## 2021-08-31 LAB — PROTIME-INR: INR: 3.3 — AB (ref 0.9–1.1)

## 2021-09-01 ENCOUNTER — Encounter: Payer: Self-pay | Admitting: Oncology

## 2021-09-01 ENCOUNTER — Encounter: Payer: Self-pay | Admitting: *Deleted

## 2021-09-07 LAB — PROTIME-INR: INR: 7.1 — AB (ref 0.9–1.1)

## 2021-09-08 ENCOUNTER — Encounter: Payer: Self-pay | Admitting: Oncology

## 2021-09-08 ENCOUNTER — Telehealth: Payer: Self-pay | Admitting: *Deleted

## 2021-09-08 NOTE — Telephone Encounter (Signed)
I did not see that she sent me my chart message but I called her on the phone and and made telephone encounter about it

## 2021-09-08 NOTE — Telephone Encounter (Signed)
Called pt and told her that the PT/INR was dated 11/17 and the results was 7.6 and she did it again and it was 7.1. she did the levels today per pt. She did not take her coumadin today. She has been eating the regular amount of green leafy vegetables. I told her to hold coumadin today and tom. And then recheck the PT/INR  Friday am so we will have a plan for the weekend. She will send me a my chart message.

## 2021-09-10 ENCOUNTER — Ambulatory Visit
Admission: EM | Admit: 2021-09-10 | Discharge: 2021-09-10 | Disposition: A | Discharge status: Home / Self Care | Payer: Medicare Other | Attending: Family Medicine | Admitting: Family Medicine

## 2021-09-10 ENCOUNTER — Ambulatory Visit: Payer: Medicare Other

## 2021-09-10 ENCOUNTER — Ambulatory Visit (INDEPENDENT_AMBULATORY_CARE_PROVIDER_SITE_OTHER): Payer: Medicare Other

## 2021-09-10 ENCOUNTER — Other Ambulatory Visit: Payer: Self-pay

## 2021-09-10 ENCOUNTER — Encounter: Payer: Self-pay | Admitting: Oncology

## 2021-09-10 ENCOUNTER — Telehealth: Payer: Self-pay | Admitting: *Deleted

## 2021-09-10 DIAGNOSIS — S99922A Unspecified injury of left foot, initial encounter: Secondary | ICD-10-CM | POA: Diagnosis not present

## 2021-09-10 DIAGNOSIS — M79672 Pain in left foot: Secondary | ICD-10-CM | POA: Diagnosis not present

## 2021-09-10 DIAGNOSIS — S90111A Contusion of right great toe without damage to nail, initial encounter: Secondary | ICD-10-CM | POA: Diagnosis not present

## 2021-09-10 LAB — PROTIME-INR: INR: 2.6 — AB (ref 0.9–1.1)

## 2021-09-10 NOTE — ED Triage Notes (Addendum)
Pt here with left foot pain, swelling starting at the ankle, bruising along the bottom of the foot with no mechanism of injury. Pulses palpated in triage.
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
## 2021-09-10 NOTE — Telephone Encounter (Signed)
Called pt about her new INR 2.6. that is a great level and she should go back  on coumadin 10 mg daily her usual dose. And she usually gets test every Tuesday so she will let us know about INR next week

## 2021-09-10 NOTE — Discharge Instructions (Signed)
Continue home pain medication and wear post op show with all weightbearing activities.    Call [facility] and schedule an appointment with Foot and Ankle specialist.

## 2021-09-14 ENCOUNTER — Encounter: Payer: Self-pay | Admitting: Oncology

## 2021-09-14 DIAGNOSIS — Z7901 Long term (current) use of anticoagulants: Secondary | ICD-10-CM | POA: Diagnosis not present

## 2021-09-14 DIAGNOSIS — D6861 Antiphospholipid syndrome: Secondary | ICD-10-CM | POA: Diagnosis not present

## 2021-09-14 LAB — PROTIME-INR: INR: 2.7 — AB (ref 0.9–1.1)

## 2021-09-20 ENCOUNTER — Telehealth: Payer: Self-pay

## 2021-09-20 NOTE — Telephone Encounter (Signed)
Pt called back and I read message to her and she said she will come pick up medication tomorrow

## 2021-09-20 NOTE — Progress Notes (Signed)
09/20/21 ALL: Amanda Vasquez returns for Botox. She continues propranolol LA 80mg  daily (written by PCP) and Emgality monthly for prevention. She continues Nurtec and cyclobenzaprine for abortive therapy. She reports doing very well. She does note that Botox is less effective toward end of 12 week cycle. She is feeling well today and without concerns.   06/22/2021 AA: Continues to do extremely well > 75% decrease freq headaches. +a. Gave some flexeril and nurtec. If she needs more botox, she can give [place] a call and I will mix up some samples for her right shoulder. Has 4 migraine days a month and 4 total headache days a month.  Consent Form Botulism Toxin Injection For Chronic Migraine    Reviewed orally with patient, additionally signature is on file:  Botulism toxin has been approved by the Federal drug administration for treatment of chronic migraine. Botulism toxin does not cure chronic migraine and it may not be effective in some patients.  The administration of botulism toxin is accomplished by injecting a small amount of toxin into the muscles of the neck and head. Dosage must be titrated for each individual. Any benefits resulting from botulism toxin tend to wear off after 3 months with a repeat injection required if benefit is to be maintained. Injections are usually done every 3-4 months with maximum effect peak achieved by about 2 or 3 weeks. Botulism toxin is expensive and you should be sure of what costs you will incur resulting from the injection.  The side effects of botulism toxin use for chronic migraine may include:   -Transient, and usually mild, facial weakness with facial injections  -Transient, and usually mild, head or neck weakness with head/neck injections  -Reduction or loss of forehead facial animation due to forehead muscle weakness  -Eyelid drooping  -Dry eye  -Pain at the site of injection or bruising at the site of injection  -Double vision  -Potential unknown long  term risks   Contraindications: You should not have Botox if you are pregnant, nursing, allergic to albumin, have an infection, skin condition, or muscle weakness at the site of the injection, or have myasthenia gravis, Lambert-Eaton syndrome, or ALS.  It is also possible that as with any injection, there may be an allergic reaction or no effect from the medication. Reduced effectiveness after repeated injections is sometimes seen and rarely infection at the injection site may occur. All care will be taken to prevent these side effects. If therapy is given over a long time, atrophy and wasting in the muscle injected may occur. Occasionally the patient's become refractory to treatment because they develop antibodies to the toxin. In this event, therapy needs to be modified.  I have read the above information and consent to the administration of botulism toxin.    BOTOX PROCEDURE NOTE FOR MIGRAINE HEADACHE  Contraindications and precautions discussed with patient(above). Aseptic procedure was observed and patient tolerated procedure. Procedure performed by Debbora Presto, FNP-C.   The condition has existed for more than 6 months, and pt does not have a diagnosis of ALS, Myasthenia Gravis or Lambert-Eaton Syndrome.  Risks and benefits of injections discussed and pt agrees to proceed with the procedure.  Written consent obtained  These injections are medically necessary. Pt  receives good benefits from these injections. These injections do not cause sedations or hallucinations which the oral therapies may cause.   Description of procedure:  The patient was placed in a sitting position. The standard protocol was used for Botox as  follows, with 5 units of Botox injected at each site:  -Procerus muscle, midline injection  -Corrugator muscle, bilateral injection  -Frontalis muscle, bilateral injection, with 2 sites each side, medial injection was performed in the upper one third of the frontalis  muscle, in the region vertical from the medial inferior edge of the superior orbital rim. The lateral injection was again in the upper one third of the forehead vertically above the lateral limbus of the cornea, 1.5 cm lateral to the medial injection site.  -Temporalis muscle injection, 4 sites, bilaterally. The first injection was 3 cm above the tragus of the ear, second injection site was 1.5 cm to 3 cm up from the first injection site in line with the tragus of the ear. The third injection site was 1.5-3 cm forward between the first 2 injection sites. The fourth injection site was 1.5 cm posterior to the second injection site. 5th site laterally in the temporalis  muscleat the level of the outer canthus.  -Occipitalis muscle injection, 3 sites, bilaterally. The first injection was done one half way between the occipital protuberance and the tip of the mastoid process behind the ear. The second injection site was done lateral and superior to the first, 1 fingerbreadth from the first injection. The third injection site was 1 fingerbreadth superiorly and medially from the first injection site.  -Cervical paraspinal muscle injection, 2 sites, bilaterally. The first injection site was 1 cm from the midline of the cervical spine, 3 cm inferior to the lower border of the occipital protuberance. The second injection site was 1.5 cm superiorly and laterally to the first injection site.  -Trapezius muscle injection was performed at 3 sites, bilaterally. The first injection site was in the upper trapezius muscle halfway between the inflection point of the neck, and the acromion. The second injection site was one half way between the acromion and the first injection site. The third injection was done between the first injection site and the inflection point of the neck.   Will return for repeat injection in 3 months.   A total of 200 units of Botox was prepared, 155 units of Botox was injected as documented  above, any Botox not injected was wasted. The patient tolerated the procedure well, there were no complications of the above procedure.

## 2021-09-20 NOTE — Telephone Encounter (Signed)
I called the patient and lvm for her to call back to let her know the Ozempic has arrived and she can pick it up. Kaulder Zahner,cma

## 2021-09-21 ENCOUNTER — Other Ambulatory Visit: Payer: Self-pay

## 2021-09-21 ENCOUNTER — Ambulatory Visit: Payer: Medicare Other | Admitting: Neurology

## 2021-09-21 ENCOUNTER — Encounter: Payer: Self-pay | Admitting: Oncology

## 2021-09-21 ENCOUNTER — Ambulatory Visit: Payer: Medicare Other | Admitting: Family Medicine

## 2021-09-21 DIAGNOSIS — G43711 Chronic migraine without aura, intractable, with status migrainosus: Secondary | ICD-10-CM | POA: Diagnosis not present

## 2021-09-21 LAB — PROTIME-INR: INR: 2.3 — AB (ref 0.9–1.1)

## 2021-09-21 NOTE — Progress Notes (Signed)
Botox- 200 units x 1 vial Lot: [id number] Expiration: 04/2024 NDC: 5625-6389-37  Bacteriostatic 0.9% Sodium Chloride- 86mL total Lot: [id number] Expiration: 10/19/21 NDC: 8115-7262-03  Dx: T59.741. B/B

## 2021-09-22 ENCOUNTER — Encounter: Payer: Self-pay | Admitting: *Deleted

## 2021-09-22 ENCOUNTER — Ambulatory Visit (INDEPENDENT_AMBULATORY_CARE_PROVIDER_SITE_OTHER): Payer: Medicare Other

## 2021-09-22 VITALS — Ht 65.0 in | Wt 197.0 lb

## 2021-09-22 DIAGNOSIS — Z0000 Encounter for general adult medical examination without abnormal findings: Secondary | ICD-10-CM | POA: Diagnosis not present

## 2021-09-22 NOTE — Telephone Encounter (Signed)
Noted  

## 2021-09-22 NOTE — Patient Instructions (Addendum)
Amanda Vasquez , Thank you for taking time to come for your Medicare Wellness Visit. I appreciate your ongoing commitment to your health goals. Please review the following plan we discussed and let me know if I can assist you in the future.   These are the goals we discussed:  Goals       Patient Stated     Medication Monitoring (pt-stated)      Patient Goals/Self-Care Activities Over the next 90 days, patient will:  - take medications as prescribed check glucose daily, document, and provide at future appointments collaborate with provider on medication access solutions target a minimum of 150 minutes of moderate intensity exercise weekly engage in dietary modifications by following eating plan prescribed by Weight Management       Weight goal 160-165lb (pt-stated)      Healthier eating habits.  Low carb/high protein.  Stay active with walking as tolerated.         This is a list of the screening recommended for you and due dates:  Health Maintenance  Topic Date Due   Urine Protein Check  01/22/2021   Hemoglobin A1C  04/23/2021   Eye exam for diabetics  01/27/2022   Complete foot exam   08/04/2022   Mammogram  04/10/2023   Pap Smear  04/09/2024   Colon Cancer Screening  05/24/2024   Tetanus Vaccine  10/25/2028   Flu Shot  Completed   COVID-19 Vaccine  Completed   Hepatitis C Screening: USPSTF Recommendation to screen - Ages 18-79 yo.  Completed   HIV Screening  Completed   Zoster (Shingles) Vaccine  Completed   HPV Vaccine  Aged Out   Advanced directives: not yet completed  Conditions/risks identified: none new  Opioid Pain Medicine Management Opioids are powerful medicines that are used to treat moderate to severe pain. When used for short periods of time, they can help you to: Sleep better. Do better in physical or occupational therapy. Feel better in the first few days after an injury. Recover from surgery. Opioids should be taken with the supervision of a  trained health care provider. They should be taken for the shortest period of time possible. This is because opioids can be addictive, and the longer you take opioids, the greater your risk of addiction. This addiction can also be called opioid use disorder. What are the risks? Using opioid pain medicines for longer than 3 days increases your risk of side effects. Side effects include: Constipation. Nausea and vomiting. Breathing difficulties (respiratory depression). Drowsiness. Confusion. Opioid use disorder. Itching. Taking opioid pain medicine for a long period of time can affect your ability to do daily tasks. It also puts you at risk for: Motor vehicle crashes. Depression. Suicide. Heart attack. Overdose, which can be life-threatening. What is a pain treatment plan? A pain treatment plan is an agreement between you and your health care provider. Pain is unique to each person, and treatments vary depending on your condition. To manage your pain, you and your health care provider need to work together. To help you do this: Discuss the goals of your treatment, including how much pain you might expect to have and how you will manage the pain. Review the risks and benefits of taking opioid medicines. Remember that a good treatment plan uses more than one approach and minimizes the chance of side effects. Be honest about the amount of medicines you take and about any drug or alcohol use. Get pain medicine prescriptions from only one health  care provider. Pain can be managed with many types of alternative treatments. Ask your health care provider to refer you to one or more specialists who can help you manage pain through: Physical or occupational therapy. Counseling (cognitive behavioral therapy). Good nutrition. Biofeedback. Massage. Meditation. Non-opioid medicine. Following a gentle exercise program. How to use opioid pain medicine Taking medicine Take your pain medicine exactly  as told by your health care provider. Take it only when you need it. If your pain gets less severe, you may take less than your prescribed dose if your health care provider approves. If you are not having pain, do nottake pain medicine unless your health care provider tells you to take it. If your pain is severe, do nottry to treat it yourself by taking more pills than instructed on your prescription. Contact your health care provider for help. Write down the times when you take your pain medicine. It is easy to become confused while on pain medicine. Writing the time can help you avoid overdose. Take other over-the-counter or prescription medicines only as told by your health care provider. Keeping yourself and others safe  While you are taking opioid pain medicine: Do not drive, use machinery, or power tools. Do not sign legal documents. Do not drink alcohol. Do not take sleeping pills. Do not supervise children by yourself. Do not do activities that require climbing or being in high places. Do not go to a lake, river, ocean, spa, or swimming pool. Do not share your pain medicine with anyone. Keep pain medicine in a locked cabinet or in a secure area where pets and children cannot reach it. Stopping your use of opioids If you have been taking opioid medicine for more than a few weeks, you may need to slowly decrease (taper) how much you take until you stop completely. Tapering your use of opioids can decrease your risk of symptoms of withdrawal, such as: Pain and cramping in the abdomen. Nausea. Sweating. Sleepiness. Restlessness. Uncontrollable shaking (tremors). Cravings for the medicine. Do not attempt to taper your use of opioids on your own. Talk with your health care provider about how to do this. Your health care provider may prescribe a step-down schedule based on how much medicine you are taking and how long you have been taking it. Getting rid of leftover pills Do not save  any leftover pills. Get rid of leftover pills safely by: Taking the medicine to a prescription take-back program. This is usually offered by the county or law enforcement. Bringing them to a pharmacy that has a drug disposal container. Flushing them down the toilet. Check the label or package insert of your medicine to see whether this is safe to do. Throwing them out in the trash. Check the label or package insert of your medicine to see whether this is safe to do. If it is safe to throw it out, remove the medicine from the original container, put it into a sealable bag or container, and mix it with used coffee grounds, food scraps, dirt, or cat litter before putting it in the trash. Follow these instructions at home: Activity Do exercises as told by your health care provider. Avoid activities that make your pain worse. Return to your normal activities as told by your health care provider. Ask your health care provider what activities are safe for you. General instructions You may need to take these actions to prevent or treat constipation: Drink enough fluid to keep your urine pale yellow. Take over-the-counter or  prescription medicines. Eat foods that are high in fiber, such as beans, whole grains, and fresh fruits and vegetables. Limit foods that are high in fat and processed sugars, such as fried or sweet foods. Keep all follow-up visits. This is important. Where to find support If you have been taking opioids for a long time, you may benefit from receiving support for quitting from a local support group or counselor. Ask your health care provider for a referral to these resources in your area. Where to find more information Centers for Disease Control and Prevention (CDC): http://www.wolf.info/ [place] Food and Drug Administration (FDA): [place] Get help right away if: You may have taken too much of an opioid (overdosed). Common symptoms of an overdose: Your breathing is slower or more shallow  than normal. You have a very slow heartbeat (pulse). You have slurred speech. You have nausea and vomiting. Your pupils become very small. You have other potential symptoms: You are very confused. You faint or feel like you will faint. You have cold, clammy skin. You have blue lips or fingernails. You have thoughts of harming yourself or harming others. These symptoms may represent a serious problem that is an emergency. Do not wait to see if the symptoms will go away. Get medical help right away. Call your local emergency services (911 in the [place] Do not drive yourself to the hospital.  If you ever feel like you may hurt yourself or others, or have thoughts about taking your own life, get help right away. Go to your nearest emergency department or: Call your local emergency services (911 in the [place] Call the [facility] (979) 615-4449 in the [place] Call a suicide crisis helpline, such as the [place] at (857)371-9185 or 988 in the [place]. This is open 24 hours a day in the [place] Text the Crisis Text Line at (609) 088-7358 (in the [place].). Summary Opioid medicines can help you manage moderate to severe pain for a short period of time. A pain treatment plan is an agreement between you and your health care provider. Discuss the goals of your treatment, including how much pain you might expect to have and how you will manage the pain. If you think that you or someone else may have taken too much of an opioid, get medical help right away. This information is not intended to replace advice given to you by your health care provider. Make sure you discuss any questions you have with your health care provider. Document Revised: 03/03/2021 Document Reviewed: 11/18/2020 Elsevier Patient Education  [place]. Follow up in one year for your annual wellness visit.   Preventive Care 40-64 Years, Female Preventive care refers to lifestyle choices and visits  with your health care provider that can promote health and wellness. What does preventive care include? A yearly physical exam. This is also called an annual well check. Dental exams once or twice a year. Routine eye exams. Ask your health care provider how often you should have your eyes checked. Personal lifestyle choices, including: Daily care of your teeth and gums. Regular physical activity. Eating a healthy diet. Avoiding tobacco and drug use. Limiting alcohol use. Practicing safe sex. Taking low-dose aspirin daily starting at age 35. Taking vitamin and mineral supplements as recommended by your health care provider. What happens during an annual well check? The services and screenings done by your health care provider during your annual well check will depend on your age, overall health, lifestyle risk factors, and  family history of disease. Counseling  Your health care provider may ask you questions about your: Alcohol use. Tobacco use. Drug use. Emotional well-being. Home and relationship well-being. Sexual activity. Eating habits. Work and work Statistician. Method of birth control. Menstrual cycle. Pregnancy history. Screening  You may have the following tests or measurements: Height, weight, and BMI. Blood pressure. Lipid and cholesterol levels. These may be checked every 5 years, or more frequently if you are over 36 years old. Skin check. Lung cancer screening. You may have this screening every year starting at age 32 if you have a 30-pack-year history of smoking and currently smoke or have quit within the past 15 years. Fecal occult blood test (FOBT) of the stool. You may have this test every year starting at age 56. Flexible sigmoidoscopy or colonoscopy. You may have a sigmoidoscopy every 5 years or a colonoscopy every 10 years starting at age 74. Hepatitis C blood test. Hepatitis B blood test. Sexually transmitted disease (STD) testing. Diabetes screening.  This is done by checking your blood sugar (glucose) after you have not eaten for a while (fasting). You may have this done every 1-3 years. Mammogram. This may be done every 1-2 years. Talk to your health care provider about when you should start having regular mammograms. This may depend on whether you have a family history of breast cancer. BRCA-related cancer screening. This may be done if you have a family history of breast, ovarian, tubal, or peritoneal cancers. Pelvic exam and Pap test. This may be done every 3 years starting at age 34. Starting at age 42, this may be done every 5 years if you have a Pap test in combination with an HPV test. Bone density scan. This is done to screen for osteoporosis. You may have this scan if you are at high risk for osteoporosis. Discuss your test results, treatment options, and if necessary, the need for more tests with your health care provider. Vaccines  Your health care provider may recommend certain vaccines, such as: Influenza vaccine. This is recommended every year. Tetanus, diphtheria, and acellular pertussis (Tdap, Td) vaccine. You may need a Td booster every 10 years. Zoster vaccine. You may need this after age 41. Pneumococcal 13-valent conjugate (PCV13) vaccine. You may need this if you have certain conditions and were not previously vaccinated. Pneumococcal polysaccharide (PPSV23) vaccine. You may need one or two doses if you smoke cigarettes or if you have certain conditions. Talk to your health care provider about which screenings and vaccines you need and how often you need them. This information is not intended to replace advice given to you by your health care provider. Make sure you discuss any questions you have with your health care provider. Document Released: 09/04/2015 Document Revised: 04/27/2016 Document Reviewed: 06/09/2015 Elsevier Interactive Patient Education  2017 [place] Prevention in the Home Falls can cause  injuries. They can happen to people of all ages. There are many things you can do to make your home safe and to help prevent falls. What can I do on the outside of my home? Regularly fix the edges of walkways and driveways and fix any cracks. Remove anything that might make you trip as you walk through a door, such as a raised step or threshold. Trim any bushes or trees on the path to your home. Use bright outdoor lighting. Clear any walking paths of anything that might make someone trip, such as rocks or tools. Regularly check to see  if handrails are loose or broken. Make sure that both sides of any steps have handrails. Any raised decks and porches should have guardrails on the edges. Have any leaves, snow, or ice cleared regularly. Use sand or salt on walking paths during winter. Clean up any spills in your garage right away. This includes oil or grease spills. What can I do in the bathroom? Use night lights. Install grab bars by the toilet and in the tub and shower. Do not use towel bars as grab bars. Use non-skid mats or decals in the tub or shower. If you need to sit down in the shower, use a plastic, non-slip stool. Keep the floor dry. Clean up any water that spills on the floor as soon as it happens. Remove soap buildup in the tub or shower regularly. Attach bath mats securely with double-sided non-slip rug tape. Do not have throw rugs and other things on the floor that can make you trip. What can I do in the bedroom? Use night lights. Make sure that you have a light by your bed that is easy to reach. Do not use any sheets or blankets that are too big for your bed. They should not hang down onto the floor. Have a firm chair that has side arms. You can use this for support while you get dressed. Do not have throw rugs and other things on the floor that can make you trip. What can I do in the kitchen? Clean up any spills right away. Avoid walking on wet floors. Keep items that you  use a lot in easy-to-reach places. If you need to reach something above you, use a strong step stool that has a grab bar. Keep electrical cords out of the way. Do not use floor polish or wax that makes floors slippery. If you must use wax, use non-skid floor wax. Do not have throw rugs and other things on the floor that can make you trip. What can I do with my stairs? Do not leave any items on the stairs. Make sure that there are handrails on both sides of the stairs and use them. Fix handrails that are broken or loose. Make sure that handrails are as long as the stairways. Check any carpeting to make sure that it is firmly attached to the stairs. Fix any carpet that is loose or worn. Avoid having throw rugs at the top or bottom of the stairs. If you do have throw rugs, attach them to the floor with carpet tape. Make sure that you have a light switch at the top of the stairs and the bottom of the stairs. If you do not have them, ask someone to add them for you. What else can I do to help prevent falls? Wear shoes that: Do not have high heels. Have rubber bottoms. Are comfortable and fit you well. Are closed at the toe. Do not wear sandals. If you use a stepladder: Make sure that it is fully opened. Do not climb a closed stepladder. Make sure that both sides of the stepladder are locked into place. Ask someone to hold it for you, if possible. Clearly mark and make sure that you can see: Any grab bars or handrails. First and last steps. Where the edge of each step is. Use tools that help you move around (mobility aids) if they are needed. These include: Canes. Walkers. Scooters. Crutches. Turn on the lights when you go into a dark area. Replace any light bulbs as soon as  they burn out. Set up your furniture so you have a clear path. Avoid moving your furniture around. If any of your floors are uneven, fix them. If there are any pets around you, be aware of where they are. Review your  medicines with your doctor. Some medicines can make you feel dizzy. This can increase your chance of falling. Ask your doctor what other things that you can do to help prevent falls. This information is not intended to replace advice given to you by your health care provider. Make sure you discuss any questions you have with your health care provider. Document Released: 06/04/2009 Document Revised: 01/14/2016 Document Reviewed: 09/12/2014 Elsevier Interactive Patient Education  2017 [employer].
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
## 2021-09-24 ENCOUNTER — Telehealth: Payer: Self-pay | Admitting: Pharmacy Technician

## 2021-09-24 DIAGNOSIS — G894 Chronic pain syndrome: Secondary | ICD-10-CM | POA: Diagnosis not present

## 2021-09-24 DIAGNOSIS — Z596 Low income: Secondary | ICD-10-CM

## 2021-09-24 DIAGNOSIS — Z79891 Long term (current) use of opiate analgesic: Secondary | ICD-10-CM | POA: Diagnosis not present

## 2021-09-24 DIAGNOSIS — G890 Central pain syndrome: Secondary | ICD-10-CM | POA: Diagnosis not present

## 2021-09-24 DIAGNOSIS — M79672 Pain in left foot: Secondary | ICD-10-CM | POA: Diagnosis not present

## 2021-09-24 NOTE — Progress Notes (Signed)
[name] [facility])                                            [place] Team    09/24/2021  Amanda Vasquez 06/30/1971 [id number]  Care coordination call placed to [employer] in regard to [facility] application.  Spoke to [facility] who informs patient is APPROVED 08/22/21-08/21/22. She informs medication refills will process automatically based on last fill date in 2022 and going forward in 2023 with delivery to patient's home.  Amanda Vasquez, [place]  [phone]

## 2021-09-28 ENCOUNTER — Encounter: Payer: Self-pay | Admitting: *Deleted

## 2021-09-28 ENCOUNTER — Encounter: Payer: Self-pay | Admitting: Oncology

## 2021-09-28 LAB — PROTIME-INR: INR: 2.4 — AB (ref 0.9–1.1)

## 2021-10-05 ENCOUNTER — Encounter: Payer: Self-pay | Admitting: Oncology

## 2021-10-05 LAB — PROTIME-INR: INR: 3.7 — AB (ref 0.9–1.1)

## 2021-10-12 DIAGNOSIS — D6861 Antiphospholipid syndrome: Secondary | ICD-10-CM | POA: Diagnosis not present

## 2021-10-12 DIAGNOSIS — Z7901 Long term (current) use of anticoagulants: Secondary | ICD-10-CM | POA: Diagnosis not present

## 2021-10-12 LAB — PROTIME-INR: INR: 4.7 — AB (ref 0.9–1.1)

## 2021-10-13 ENCOUNTER — Encounter: Payer: Self-pay | Admitting: Oncology

## 2021-10-19 ENCOUNTER — Telehealth: Payer: Self-pay | Admitting: Pharmacist

## 2021-10-19 ENCOUNTER — Telehealth: Payer: Self-pay | Admitting: Pharmacy Technician

## 2021-10-19 ENCOUNTER — Encounter: Payer: Self-pay | Admitting: Oncology

## 2021-10-19 DIAGNOSIS — Z596 Low income: Secondary | ICD-10-CM

## 2021-10-19 LAB — PROTIME-INR: INR: 3.6 — AB (ref 0.9–1.1)

## 2021-10-19 NOTE — Progress Notes (Signed)
[place] [facility])                                            [place] Team    10/19/2021  Amanda Vasquez 05-18-1971 [id number]  Received both patient and provider portion(s) of patient assistance application(s) for Ozempic and Emgality. Faxed completed application and required documents into [employer] and [employer].   Eddi Hymes P. Macai Sisneros, [place]  [phone]

## 2021-10-19 NOTE — Telephone Encounter (Signed)
**Note Amanda-Identified via Obfuscation** Medication Samples have been labeled and logged for the patient.  Drug name: Ozempic       Strength: 8 mg/3 mL        Qty: 1 pen  LOT: [id number]  Exp.Date: 01/2022  Dosing instructions: Inject 2 mg weekly  The patient has been instructed regarding the correct time, dose, and frequency of taking this medication, including desired effects and most common side effects.   Amanda Vasquez 8:21 AM 10/19/2021

## 2021-10-22 NOTE — Telephone Encounter (Signed)
Patient picked up sample 

## 2021-10-25 DIAGNOSIS — M62830 Muscle spasm of back: Secondary | ICD-10-CM | POA: Diagnosis not present

## 2021-10-25 DIAGNOSIS — G894 Chronic pain syndrome: Secondary | ICD-10-CM | POA: Diagnosis not present

## 2021-10-25 DIAGNOSIS — Z79891 Long term (current) use of opiate analgesic: Secondary | ICD-10-CM | POA: Diagnosis not present

## 2021-10-25 DIAGNOSIS — G890 Central pain syndrome: Secondary | ICD-10-CM | POA: Diagnosis not present

## 2021-10-26 ENCOUNTER — Ambulatory Visit: Payer: Self-pay | Admitting: Pharmacist

## 2021-10-26 DIAGNOSIS — Z79891 Long term (current) use of opiate analgesic: Secondary | ICD-10-CM | POA: Diagnosis not present

## 2021-10-26 DIAGNOSIS — G894 Chronic pain syndrome: Secondary | ICD-10-CM | POA: Diagnosis not present

## 2021-10-26 NOTE — Patient Instructions (Signed)
Hi Clarise Cruz,  ? ?Unfortunately, I am being asked to quickly transition into another role within the health system, so I am unable to keep our next appointment. Please continue to follow up with your primary care provider as scheduled. Please continue to collaborate with Sharee Pimple regarding following up with your patient assistance applications ? ?It has been a pleasure working with you! ? ?Catie Darnelle Maffucci, PharmD ? ?

## 2021-10-26 NOTE — Chronic Care Management (AMB) (Signed)
?  Chronic Care Management  ? ?Note ? ?10/26/2021 ?Name: DERIAN PFOST MRN: [id number] DOB: 12/17/1970 ? ? ? ?Closing pharmacy CCM case at this time.  Patient has clinic contact information for future questions or concerns.  ? ?Catie Darnelle Maffucci, PharmD, [place], CPP ?Clinical Pharmacist ?Therapist, music at [employer] ?[phone] ? ?

## 2021-10-27 ENCOUNTER — Other Ambulatory Visit: Payer: Self-pay | Admitting: Oncology

## 2021-10-27 LAB — PROTIME-INR: INR: 3 — AB (ref 0.80–1.20)

## 2021-10-27 NOTE — Telephone Encounter (Signed)
?  Component Ref Range & Units 8 d ago ?(10/19/21) 2 wk ago ?(10/12/21) 3 wk ago ?(10/05/21) 4 wk ago ?(09/28/21) 1 mo ago ?(09/21/21) 1 mo ago ?(09/14/21) 1 mo ago ?(09/10/21)  ?INR 0.9 - 1.1 3.6 Abnormal   4.7 Abnormal   3.7 Abnormal   2.4 Abnormal   2.3 Abnormal   2.7 Abnormal   2.6 Abnormal    ?  ? ?

## 2021-10-28 ENCOUNTER — Encounter: Payer: Self-pay | Admitting: Oncology

## 2021-10-28 ENCOUNTER — Telehealth: Payer: Self-pay | Admitting: Pharmacy Technician

## 2021-10-28 DIAGNOSIS — Z596 Low income: Secondary | ICD-10-CM

## 2021-10-28 NOTE — Progress Notes (Addendum)
[name])                                            [name]    ADDENDUM  Patient returned the call. Informed her of the information as outlined below. Patient informs she will drop off the information to the clinic today. Amanda Vasquez P. [name], [place]  [phone]   10/28/2021  [name] [birth year] [id number]  2 care coordination calls placed to patient assistance companies in regard to determination status.  First call was placed to [place] in regard to [employer] application. Spoke to [place] who informs the application was received but is still in the processing phase.  Second call was placed to [employer] in regard to [employer] application. Spoke to [place] who informs updated proof of income is needed to process the application. She informs the income submitted was a 2021 tax form 1099 and a 2021 W2. Those pieces of documents are acceptable forms, However, they must be from 2022 in order for processing to continue.  Unsuccessful outreach call placed to patient requesting a call back in reference to the above information. Sent message to embedded PharmD to update her on patient's status and to inquire if she could send patient a Mychart message in regard to the [employer] application.  [name], [place]  [phone]

## 2021-11-02 LAB — PROTIME-INR: INR: 2.8 — AB (ref 0.80–1.20)

## 2021-11-03 ENCOUNTER — Encounter: Payer: Self-pay | Admitting: Oncology

## 2021-11-05 ENCOUNTER — Telehealth: Payer: Self-pay | Admitting: Pharmacy Technician

## 2021-11-05 DIAGNOSIS — Z596 Low income: Secondary | ICD-10-CM

## 2021-11-05 NOTE — Progress Notes (Signed)
[place] [facility])  ?                                          [facility]acy Team ?  ? ?11/05/2021 ? ?Danelle Earthly ?11-Nov-1970 ?[id number] ? ?2 care coordination calls placed to [employer] in regard to [place] in regard to [employer] application. ? ?Spoke to [place] at [employer] in regard to [place] application and she informed patient was APPROVED 11/05/21-08/21/22. She informs medication will be delivered to the provider's office in the next 42 days. ? ?Spoke to [employer] at [place] in regard to [place] application and she informed patient was APPROVED 10/29/21-08/21/22. She informs medication will be delivered to the patient's address. ? ?Sokha Craker P. Sayla Golonka, CPhT ?[place]  ?[phone] ? ? ? ?

## 2021-11-09 DIAGNOSIS — Z7901 Long term (current) use of anticoagulants: Secondary | ICD-10-CM | POA: Diagnosis not present

## 2021-11-09 DIAGNOSIS — D6861 Antiphospholipid syndrome: Secondary | ICD-10-CM | POA: Diagnosis not present

## 2021-11-09 LAB — PROTIME-INR: INR: 2.8 — AB (ref 0.80–1.20)

## 2021-11-10 ENCOUNTER — Encounter: Payer: Self-pay | Admitting: Oncology

## 2021-11-11 ENCOUNTER — Encounter: Payer: Self-pay | Admitting: *Deleted

## 2021-11-16 LAB — PROTIME-INR: INR: 3.3 — AB (ref 0.80–1.20)

## 2021-11-16 NOTE — Telephone Encounter (Signed)
Pt called requesting ozempic samples. Pt said Catie usually gives them to her ?

## 2021-11-17 ENCOUNTER — Encounter: Payer: Self-pay | Admitting: Oncology

## 2021-11-17 ENCOUNTER — Encounter: Payer: Self-pay | Admitting: *Deleted

## 2021-11-22 ENCOUNTER — Telehealth: Payer: Self-pay | Admitting: Internal Medicine

## 2021-11-22 NOTE — Telephone Encounter (Signed)
Patient assistant medications came in and Patient has been informed that these are ready too be picked up.  ?

## 2021-11-22 NOTE — Telephone Encounter (Signed)
Received Patient assistance Ozempic. Labelled and  will be placed in the medication fridge.  ? ?Called and informed the Patient that medication is available for pick up. Patient verbalized understanding and states she will try to come by and pick up the medication today.  ?

## 2021-11-23 DIAGNOSIS — G890 Central pain syndrome: Secondary | ICD-10-CM | POA: Diagnosis not present

## 2021-11-23 DIAGNOSIS — M62830 Muscle spasm of back: Secondary | ICD-10-CM | POA: Diagnosis not present

## 2021-11-23 DIAGNOSIS — Z79891 Long term (current) use of opiate analgesic: Secondary | ICD-10-CM | POA: Diagnosis not present

## 2021-11-23 DIAGNOSIS — G894 Chronic pain syndrome: Secondary | ICD-10-CM | POA: Diagnosis not present

## 2021-11-23 LAB — PROTIME-INR: INR: 4 — AB (ref 0.80–1.20)

## 2021-11-25 ENCOUNTER — Encounter: Payer: Self-pay | Admitting: Oncology

## 2021-11-25 ENCOUNTER — Other Ambulatory Visit: Payer: Self-pay | Admitting: *Deleted

## 2021-11-25 ENCOUNTER — Telehealth: Payer: Self-pay | Admitting: *Deleted

## 2021-11-25 MED ORDER — WARFARIN SODIUM 5 MG PO TABS: 5.0000 mg | ORAL_TABLET | ORAL | 1 refills | Status: DC

## 2021-11-25 NOTE — Telephone Encounter (Signed)
I called pt to make sure the dose of coumadin pt is on. I have that she gets coumadin 10 mg every day. Since her INR numbers  have been high Dr. Janese Banks wants to cut down on coumadin dose 1 day a week and continue 10 mg for 6 days and I will send in rx for coumadin for 5 mg and only take 1 '5mg'$  once a week. Patient is fine with this plan and she checks her level weekly.  ?

## 2021-11-25 NOTE — Progress Notes (Signed)
coumadin

## 2021-11-30 LAB — PROTIME-INR: INR: 2.8 — AB (ref 0.80–1.20)

## 2021-12-01 ENCOUNTER — Encounter: Payer: Self-pay | Admitting: Oncology

## 2021-12-07 DIAGNOSIS — Z7901 Long term (current) use of anticoagulants: Secondary | ICD-10-CM | POA: Diagnosis not present

## 2021-12-07 DIAGNOSIS — D6861 Antiphospholipid syndrome: Secondary | ICD-10-CM | POA: Diagnosis not present

## 2021-12-07 LAB — PROTIME-INR: INR: 2 — AB (ref 0.80–1.20)

## 2021-12-08 ENCOUNTER — Encounter: Payer: Self-pay | Admitting: *Deleted

## 2021-12-08 ENCOUNTER — Encounter: Payer: Self-pay | Admitting: Oncology

## 2021-12-14 ENCOUNTER — Ambulatory Visit: Payer: Medicare Other | Admitting: Neurology

## 2021-12-14 LAB — PROTIME-INR: INR: 3.5 — AB (ref 0.80–1.20)

## 2021-12-15 ENCOUNTER — Encounter: Payer: Self-pay | Admitting: Oncology

## 2021-12-21 ENCOUNTER — Encounter: Payer: Self-pay | Admitting: Neurology

## 2021-12-21 ENCOUNTER — Ambulatory Visit: Payer: Medicare Other | Admitting: Neurology

## 2021-12-21 DIAGNOSIS — G894 Chronic pain syndrome: Secondary | ICD-10-CM | POA: Diagnosis not present

## 2021-12-21 DIAGNOSIS — G43711 Chronic migraine without aura, intractable, with status migrainosus: Secondary | ICD-10-CM

## 2021-12-21 DIAGNOSIS — Z79891 Long term (current) use of opiate analgesic: Secondary | ICD-10-CM | POA: Diagnosis not present

## 2021-12-21 DIAGNOSIS — M62830 Muscle spasm of back: Secondary | ICD-10-CM | POA: Diagnosis not present

## 2021-12-21 DIAGNOSIS — G890 Central pain syndrome: Secondary | ICD-10-CM | POA: Diagnosis not present

## 2021-12-21 LAB — PROTIME-INR: INR: 4.2 — AB (ref 0.80–1.20)

## 2021-12-21 NOTE — Progress Notes (Signed)
Botox- 200 units x 1 vial ?Lot: [id number] ?Expiration: 07/2024 ?NDC: 8786-7672-09 ? ?0.9% Sodium Chloride- 80m total ?Lot: [id number]?Expiration: 09/22/2022 ?[place] [address]-65? ?Dx:  ?GY65.035?B/B ? ?

## 2021-12-21 NOTE — Progress Notes (Signed)
? ?Consent Form ?Botulism Toxin Injection For Chronic Migraine ? ?12/21/2021. Stable doing great ?06/22/2021: Continues to do extremely well > 75% decrease freq headaches. +a. Gave some flexeril and nurtec. If she needs more botox, she can give [place] a call and I will mix up some samples for her right shoulder. Has 4 migraine days a month and 4 total headache days a month. ? ? ?Reviewed orally with patient, additionally signature is on file: ? ?Botulism toxin has been approved by the Federal drug administration for treatment of chronic migraine. Botulism toxin does not cure chronic migraine and it may not be effective in some patients. ? ?The administration of botulism toxin is accomplished by injecting a small amount of toxin into the muscles of the neck and head. Dosage must be titrated for each individual. Any benefits resulting from botulism toxin tend to wear off after 3 months with a repeat injection required if benefit is to be maintained. Injections are usually done every 3-4 months with maximum effect peak achieved by about 2 or 3 weeks. Botulism toxin is expensive and you should be sure of what costs you will incur resulting from the injection. ? ?The side effects of botulism toxin use for chronic migraine may include: ? ? -Transient, and usually mild, facial weakness with facial injections ? -Transient, and usually mild, head or neck weakness with head/neck injections ? -Reduction or loss of forehead facial animation due to forehead muscle weakness ? -Eyelid drooping ? -Dry eye ? -Pain at the site of injection or bruising at the site of injection ? -Double vision ? -Potential unknown long term risks ? ?Contraindications: You should not have Botox if you are pregnant, nursing, allergic to albumin, have an infection, skin condition, or muscle weakness at the site of the injection, or have myasthenia gravis, Lambert-Eaton syndrome, or ALS. ? ?It is also possible that as with any injection, there may be an  allergic reaction or no effect from the medication. Reduced effectiveness after repeated injections is sometimes seen and rarely infection at the injection site may occur. All care will be taken to prevent these side effects. If therapy is given over a long time, atrophy and wasting in the muscle injected may occur. Occasionally the patient's become refractory to treatment because they develop antibodies to the toxin. In this event, therapy needs to be modified. ? ?I have read the above information and consent to the administration of botulism toxin. ? ? ? ?BOTOX PROCEDURE NOTE FOR MIGRAINE HEADACHE ? ? ? ?Contraindications and precautions discussed with patient(above). Aseptic procedure was observed and patient tolerated procedure. Procedure performed by Dr. Georgia Dom ? ?The condition has existed for more than 6 months, and pt does not have a diagnosis of ALS, Myasthenia Gravis or Lambert-Eaton Syndrome.  Risks and benefits of injections discussed and pt agrees to proceed with the procedure.  Written consent obtained ? ?These injections are medically necessary. Pt  receives good benefits from these injections. These injections do not cause sedations or hallucinations which the oral therapies may cause. ? ?Description of procedure: ? ?The patient was placed in a sitting position. The standard protocol was used for Botox as follows, with 5 units of Botox injected at each site: ? ? ?-Procerus muscle, midline injection ? ?-Corrugator muscle, bilateral injection ? ?-Frontalis muscle, bilateral injection, with 2 sites each side, medial injection was performed in the upper one third of the frontalis muscle, in the region vertical from the medial inferior edge of the superior orbital  rim. The lateral injection was again in the upper one third of the forehead vertically above the lateral limbus of the cornea, 1.5 cm lateral to the medial injection site. ? ? ? ?-Temporalis muscle injection, 4 sites, bilaterally. The first  injection was 3 cm above the tragus of the ear, second injection site was 1.5 cm to 3 cm up from the first injection site in line with the tragus of the ear. The third injection site was 1.5-3 cm forward between the first 2 injection sites.  ? ?-Occipitalis muscle injection, 3 sites, bilaterally. The first injection was done one half way between the occipital protuberance and the tip of the mastoid process behind the ear. The second injection site was done lateral and superior to the first, 1 fingerbreadth from the first injection. The third injection site was 1 fingerbreadth superiorly and medially from the first injection site. ? ?-Cervical paraspinal muscle injection, 2 sites, bilateral knee first injection site was 1 cm from the midline of the cervical spine, 3 cm inferior to the lower border of the occipital protuberance. The second injection site was 1.5 cm superiorly and laterally to the first injection site. ? ?-Trapezius muscle injection was performed at 3 sites, bilaterally. The first injection site was in the upper trapezius muscle halfway between the inflection point of the neck, and the acromion. The second injection site was one half way between the acromion and the first injection site. The third injection was done between the first injection site and the inflection point of the neck. ? ? ?Will return for repeat injection in 3 months. ? ? ?155 unit sof Botox was used, 45U Botox not injected was wasted. The patient tolerated the procedure well, there were no complications of the above procedure. ?

## 2021-12-22 ENCOUNTER — Encounter: Payer: Self-pay | Admitting: Oncology

## 2021-12-23 ENCOUNTER — Encounter (INDEPENDENT_AMBULATORY_CARE_PROVIDER_SITE_OTHER): Payer: Self-pay

## 2021-12-24 ENCOUNTER — Encounter: Payer: Self-pay | Admitting: *Deleted

## 2021-12-27 LAB — PROTIME-INR: INR: 3.9 — AB (ref 0.80–1.20)

## 2022-01-02 DIAGNOSIS — Z7901 Long term (current) use of anticoagulants: Secondary | ICD-10-CM | POA: Diagnosis not present

## 2022-01-02 DIAGNOSIS — D6861 Antiphospholipid syndrome: Secondary | ICD-10-CM | POA: Diagnosis not present

## 2022-01-02 LAB — PROTIME-INR: INR: 3.8 — AB (ref 0.80–1.20)

## 2022-01-03 ENCOUNTER — Encounter: Payer: Self-pay | Admitting: Oncology

## 2022-01-04 LAB — PROTIME-INR: INR: 3.7 — AB (ref 0.80–1.20)

## 2022-01-11 LAB — PROTIME-INR: INR: 3.7 — AB (ref 0.80–1.20)

## 2022-01-12 ENCOUNTER — Encounter: Payer: Self-pay | Admitting: Oncology

## 2022-01-18 DIAGNOSIS — G894 Chronic pain syndrome: Secondary | ICD-10-CM | POA: Diagnosis not present

## 2022-01-18 DIAGNOSIS — G890 Central pain syndrome: Secondary | ICD-10-CM | POA: Diagnosis not present

## 2022-01-18 DIAGNOSIS — M62830 Muscle spasm of back: Secondary | ICD-10-CM | POA: Diagnosis not present

## 2022-01-18 DIAGNOSIS — Z79891 Long term (current) use of opiate analgesic: Secondary | ICD-10-CM | POA: Diagnosis not present

## 2022-01-18 LAB — PROTIME-INR: INR: 3.1 — AB (ref 0.80–1.20)

## 2022-01-19 ENCOUNTER — Encounter: Payer: Self-pay | Admitting: Oncology

## 2022-01-19 ENCOUNTER — Encounter: Payer: Self-pay | Admitting: *Deleted

## 2022-01-29 ENCOUNTER — Other Ambulatory Visit: Payer: Self-pay | Admitting: Internal Medicine

## 2022-01-29 DIAGNOSIS — F32A Depression, unspecified: Secondary | ICD-10-CM

## 2022-01-29 DIAGNOSIS — E1165 Type 2 diabetes mellitus with hyperglycemia: Secondary | ICD-10-CM

## 2022-01-30 ENCOUNTER — Inpatient Hospital Stay
Admission: EM | Admit: 2022-01-30 | Discharge: 2022-02-01 | DRG: 638 | Disposition: A | Discharge status: Home / Self Care | Payer: Medicare Other | Attending: Internal Medicine | Admitting: Internal Medicine

## 2022-01-30 ENCOUNTER — Encounter: Payer: Self-pay | Admitting: Emergency Medicine

## 2022-01-30 ENCOUNTER — Other Ambulatory Visit: Payer: Self-pay

## 2022-01-30 ENCOUNTER — Emergency Department: Payer: Medicare Other

## 2022-01-30 DIAGNOSIS — R1033 Periumbilical pain: Secondary | ICD-10-CM

## 2022-01-30 DIAGNOSIS — Z7901 Long term (current) use of anticoagulants: Secondary | ICD-10-CM | POA: Diagnosis not present

## 2022-01-30 DIAGNOSIS — G890 Central pain syndrome: Secondary | ICD-10-CM | POA: Diagnosis present

## 2022-01-30 DIAGNOSIS — F32A Depression, unspecified: Secondary | ICD-10-CM | POA: Diagnosis present

## 2022-01-30 DIAGNOSIS — K5909 Other constipation: Secondary | ICD-10-CM | POA: Diagnosis present

## 2022-01-30 DIAGNOSIS — F419 Anxiety disorder, unspecified: Secondary | ICD-10-CM | POA: Diagnosis not present

## 2022-01-30 DIAGNOSIS — R000 Tachycardia, unspecified: Secondary | ICD-10-CM | POA: Diagnosis not present

## 2022-01-30 DIAGNOSIS — R109 Unspecified abdominal pain: Secondary | ICD-10-CM | POA: Diagnosis present

## 2022-01-30 DIAGNOSIS — R112 Nausea with vomiting, unspecified: Secondary | ICD-10-CM

## 2022-01-30 DIAGNOSIS — I10 Essential (primary) hypertension: Secondary | ICD-10-CM | POA: Diagnosis not present

## 2022-01-30 DIAGNOSIS — E785 Hyperlipidemia, unspecified: Secondary | ICD-10-CM | POA: Diagnosis present

## 2022-01-30 DIAGNOSIS — E119 Type 2 diabetes mellitus without complications: Secondary | ICD-10-CM

## 2022-01-30 DIAGNOSIS — Z91040 Latex allergy status: Secondary | ICD-10-CM

## 2022-01-30 DIAGNOSIS — E1110 Type 2 diabetes mellitus with ketoacidosis without coma: Principal | ICD-10-CM | POA: Diagnosis present

## 2022-01-30 DIAGNOSIS — R079 Chest pain, unspecified: Secondary | ICD-10-CM | POA: Diagnosis not present

## 2022-01-30 DIAGNOSIS — Z79899 Other long term (current) drug therapy: Secondary | ICD-10-CM

## 2022-01-30 DIAGNOSIS — D6861 Antiphospholipid syndrome: Secondary | ICD-10-CM | POA: Diagnosis not present

## 2022-01-30 DIAGNOSIS — I2782 Chronic pulmonary embolism: Secondary | ICD-10-CM | POA: Diagnosis not present

## 2022-01-30 DIAGNOSIS — E876 Hypokalemia: Secondary | ICD-10-CM | POA: Diagnosis not present

## 2022-01-30 DIAGNOSIS — R1084 Generalized abdominal pain: Secondary | ICD-10-CM | POA: Diagnosis not present

## 2022-01-30 DIAGNOSIS — Z7984 Long term (current) use of oral hypoglycemic drugs: Secondary | ICD-10-CM | POA: Diagnosis not present

## 2022-01-30 DIAGNOSIS — R0602 Shortness of breath: Secondary | ICD-10-CM | POA: Diagnosis not present

## 2022-01-30 DIAGNOSIS — F411 Generalized anxiety disorder: Secondary | ICD-10-CM | POA: Diagnosis present

## 2022-01-30 DIAGNOSIS — S0990XA Unspecified injury of head, initial encounter: Secondary | ICD-10-CM | POA: Diagnosis not present

## 2022-01-30 DIAGNOSIS — E872 Acidosis, unspecified: Secondary | ICD-10-CM

## 2022-01-30 DIAGNOSIS — Z818 Family history of other mental and behavioral disorders: Secondary | ICD-10-CM

## 2022-01-30 DIAGNOSIS — Z9049 Acquired absence of other specified parts of digestive tract: Secondary | ICD-10-CM

## 2022-01-30 DIAGNOSIS — Z87891 Personal history of nicotine dependence: Secondary | ICD-10-CM

## 2022-01-30 DIAGNOSIS — Z86711 Personal history of pulmonary embolism: Secondary | ICD-10-CM | POA: Diagnosis not present

## 2022-01-30 DIAGNOSIS — I2609 Other pulmonary embolism with acute cor pulmonale: Secondary | ICD-10-CM | POA: Diagnosis not present

## 2022-01-30 DIAGNOSIS — Z79891 Long term (current) use of opiate analgesic: Secondary | ICD-10-CM

## 2022-01-30 DIAGNOSIS — R1110 Vomiting, unspecified: Secondary | ICD-10-CM | POA: Diagnosis not present

## 2022-01-30 DIAGNOSIS — I2699 Other pulmonary embolism without acute cor pulmonale: Secondary | ICD-10-CM | POA: Diagnosis present

## 2022-01-30 LAB — HEMOGLOBIN A1C
Hgb A1c MFr Bld: 5.8 % — ABNORMAL HIGH (ref 4.8–5.6)
Hgb A1c MFr Bld: 5.6 % (ref 4.8–5.6)
Mean Plasma Glucose: 119.76 mg/dL
Mean Plasma Glucose: 114.02 mg/dL

## 2022-01-30 LAB — URINALYSIS, ROUTINE W REFLEX MICROSCOPIC
Bacteria, UA: NONE SEEN
Bilirubin Urine: NEGATIVE
Glucose, UA: >=500 mg/dL — AB
Ketones, ur: 80 mg/dL — AB
Leukocytes,Ua: NEGATIVE
Nitrite: NEGATIVE
Protein, ur: 30 mg/dL — AB
RBC / HPF: >50 RBC/hpf — ABNORMAL HIGH (ref 0–5)
Specific Gravity, Urine: >1.046 — ABNORMAL HIGH (ref 1.005–1.030)
pH: 7 (ref 5.0–8.0)

## 2022-01-30 LAB — BASIC METABOLIC PANEL
Anion gap: 11 (ref 5–15)
Anion gap: 5 (ref 5–15)
Anion gap: 6 (ref 5–15)
Anion gap: 5 (ref 5–15)
BUN: 15 mg/dL (ref 6–20)
BUN: 13 mg/dL (ref 6–20)
BUN: 11 mg/dL (ref 6–20)
BUN: 11 mg/dL (ref 6–20)
CO2: 24 mmol/L (ref 22–32)
CO2: 27 mmol/L (ref 22–32)
CO2: 26 mmol/L (ref 22–32)
CO2: 25 mmol/L (ref 22–32)
Calcium: 8.6 mg/dL — ABNORMAL LOW (ref 8.9–10.3)
Calcium: 8.6 mg/dL — ABNORMAL LOW (ref 8.9–10.3)
Calcium: 8.4 mg/dL — ABNORMAL LOW (ref 8.9–10.3)
Calcium: 8.2 mg/dL — ABNORMAL LOW (ref 8.9–10.3)
Chloride: 100 mmol/L (ref 98–111)
Chloride: 105 mmol/L (ref 98–111)
Chloride: 104 mmol/L (ref 98–111)
Chloride: 107 mmol/L (ref 98–111)
Creatinine, Ser: 0.61 mg/dL (ref 0.44–1.00)
Creatinine, Ser: 0.54 mg/dL (ref 0.44–1.00)
Creatinine, Ser: 0.63 mg/dL (ref 0.44–1.00)
Creatinine, Ser: 0.46 mg/dL (ref 0.44–1.00)
GFR, Estimated: >60 mL/min (ref >60)
GFR, Estimated: >60 mL/min (ref >60)
GFR, Estimated: >60 mL/min (ref >60)
GFR, Estimated: >60 mL/min (ref >60)
Glucose, Bld: 140 mg/dL — ABNORMAL HIGH (ref 70–99)
Glucose, Bld: 161 mg/dL — ABNORMAL HIGH (ref 70–99)
Glucose, Bld: 157 mg/dL — ABNORMAL HIGH (ref 70–99)
Glucose, Bld: 146 mg/dL — ABNORMAL HIGH (ref 70–99)
Potassium: 3 mmol/L — ABNORMAL LOW (ref 3.5–5.1)
Potassium: 3.2 mmol/L — ABNORMAL LOW (ref 3.5–5.1)
Potassium: 3.5 mmol/L (ref 3.5–5.1)
Potassium: 3.4 mmol/L — ABNORMAL LOW (ref 3.5–5.1)
Sodium: 135 mmol/L (ref 135–145)
Sodium: 137 mmol/L (ref 135–145)
Sodium: 136 mmol/L (ref 135–145)
Sodium: 137 mmol/L (ref 135–145)

## 2022-01-30 LAB — BLOOD GAS, VENOUS
Acid-Base Excess: 6.6 mmol/L — ABNORMAL HIGH (ref 0.0–2.0)
Bicarbonate: 27.5 mmol/L (ref 20.0–28.0)
O2 Saturation: 39.6 %
Patient temperature: 37
pCO2, Ven: 28 mmHg — ABNORMAL LOW (ref 44–60)
pH, Ven: 7.6 — ABNORMAL HIGH (ref 7.25–7.43)
pO2, Ven: <31 mmHg — CL (ref 32–45)

## 2022-01-30 LAB — COMPREHENSIVE METABOLIC PANEL
ALT: 14 U/L (ref 0–44)
AST: 22 U/L (ref 15–41)
Albumin: 4.1 g/dL (ref 3.5–5.0)
Alkaline Phosphatase: 50 U/L (ref 38–126)
Anion gap: 16 — ABNORMAL HIGH (ref 5–15)
BUN: 20 mg/dL (ref 6–20)
CO2: 21 mmol/L — ABNORMAL LOW (ref 22–32)
Calcium: 9.4 mg/dL (ref 8.9–10.3)
Chloride: 98 mmol/L (ref 98–111)
Creatinine, Ser: 0.72 mg/dL (ref 0.44–1.00)
GFR, Estimated: >60 mL/min (ref >60)
Glucose, Bld: 222 mg/dL — ABNORMAL HIGH (ref 70–99)
Potassium: 3 mmol/L — ABNORMAL LOW (ref 3.5–5.1)
Sodium: 135 mmol/L (ref 135–145)
Total Bilirubin: 1.6 mg/dL — ABNORMAL HIGH (ref 0.3–1.2)
Total Protein: 7.3 g/dL (ref 6.5–8.1)

## 2022-01-30 LAB — GLUCOSE, CAPILLARY
Glucose-Capillary: 144 mg/dL — ABNORMAL HIGH (ref 70–99)
Glucose-Capillary: 116 mg/dL — ABNORMAL HIGH (ref 70–99)
Glucose-Capillary: 116 mg/dL — ABNORMAL HIGH (ref 70–99)
Glucose-Capillary: 149 mg/dL — ABNORMAL HIGH (ref 70–99)
Glucose-Capillary: 153 mg/dL — ABNORMAL HIGH (ref 70–99)
Glucose-Capillary: 155 mg/dL — ABNORMAL HIGH (ref 70–99)
Glucose-Capillary: 127 mg/dL — ABNORMAL HIGH (ref 70–99)
Glucose-Capillary: 124 mg/dL — ABNORMAL HIGH (ref 70–99)

## 2022-01-30 LAB — BETA-HYDROXYBUTYRIC ACID
Beta-Hydroxybutyric Acid: 2.71 mmol/L — ABNORMAL HIGH (ref 0.05–0.27)
Beta-Hydroxybutyric Acid: 2.19 mmol/L — ABNORMAL HIGH (ref 0.05–0.27)
Beta-Hydroxybutyric Acid: 0.12 mmol/L (ref 0.05–0.27)

## 2022-01-30 LAB — LACTIC ACID, PLASMA
Lactic Acid, Venous: 2.6 mmol/L (ref 0.5–1.9)
Lactic Acid, Venous: 2.8 mmol/L (ref 0.5–1.9)

## 2022-01-30 LAB — CBC WITH DIFFERENTIAL/PLATELET
Abs Immature Granulocytes: 0.03 10*3/uL (ref 0.00–0.07)
Basophils Absolute: 0 10*3/uL (ref 0.0–0.1)
Basophils Relative: 0 %
Eosinophils Absolute: 0 10*3/uL (ref 0.0–0.5)
Eosinophils Relative: 1 %
HCT: 45.9 % (ref 36.0–46.0)
Hemoglobin: 16.4 g/dL — ABNORMAL HIGH (ref 12.0–15.0)
Immature Granulocytes: 0 %
Lymphocytes Relative: 25 %
Lymphs Abs: 1.8 10*3/uL (ref 0.7–4.0)
MCH: 28.7 pg (ref 26.0–34.0)
MCHC: 35.7 g/dL (ref 30.0–36.0)
MCV: 80.2 fL (ref 80.0–100.0)
Monocytes Absolute: 0.5 10*3/uL (ref 0.1–1.0)
Monocytes Relative: 8 %
Neutro Abs: 4.7 10*3/uL (ref 1.7–7.7)
Neutrophils Relative %: 66 %
Platelets: 349 10*3/uL (ref 150–400)
RBC: 5.72 MIL/uL — ABNORMAL HIGH (ref 3.87–5.11)
RDW: 13 % (ref 11.5–15.5)
WBC: 7.1 10*3/uL (ref 4.0–10.5)
nRBC: 0 % (ref 0.0–0.2)

## 2022-01-30 LAB — PROTIME-INR
INR: 5.9 (ref 0.8–1.2)
Prothrombin Time: 52.6 seconds — ABNORMAL HIGH (ref 11.4–15.2)

## 2022-01-30 LAB — LIPASE, BLOOD: Lipase: 73 U/L — ABNORMAL HIGH (ref 11–51)

## 2022-01-30 LAB — MAGNESIUM
Magnesium: 1.8 mg/dL (ref 1.7–2.4)
Magnesium: 1.9 mg/dL (ref 1.7–2.4)

## 2022-01-30 LAB — TROPONIN I (HIGH SENSITIVITY)
Troponin I (High Sensitivity): 17 ng/L (ref <18)
Troponin I (High Sensitivity): 20 ng/L — ABNORMAL HIGH (ref <18)

## 2022-01-30 LAB — CK: Total CK: 101 U/L (ref 38–234)

## 2022-01-30 LAB — TSH: TSH: 0.487 u[IU]/mL (ref 0.350–4.500)

## 2022-01-30 LAB — CBG MONITORING, ED: Glucose-Capillary: 186 mg/dL — ABNORMAL HIGH (ref 70–99)

## 2022-01-30 LAB — AMMONIA: Ammonia: <10 umol/L (ref 9–35)

## 2022-01-30 LAB — ETHANOL: Alcohol, Ethyl (B): <10 mg/dL (ref <10)

## 2022-01-30 LAB — MRSA NEXT GEN BY PCR, NASAL: MRSA by PCR Next Gen: NOT DETECTED

## 2022-01-30 MED ORDER — PROPRANOLOL HCL ER 80 MG PO CP24
80.0000 mg | ORAL_CAPSULE | Freq: Every day | ORAL | Status: DC
  Administered 2022-01-30 – 2022-02-01 (×3): 80 mg via ORAL
  Filled 2022-01-30 (×3): qty 1

## 2022-01-30 MED ORDER — SODIUM CHLORIDE 0.9 % IV SOLN: INTRAVENOUS | Status: DC

## 2022-01-30 MED ORDER — CALCIUM CARBONATE 1250 (500 CA) MG PO TABS
1250.0000 mg | ORAL_TABLET | Freq: Every day | ORAL | Status: DC
  Administered 2022-01-31 – 2022-02-01 (×2): 1250 mg via ORAL
  Filled 2022-01-30 (×2): qty 1

## 2022-01-30 MED ORDER — OXYCODONE HCL 5 MG PO TABS: 5.0000 mg | ORAL_TABLET | ORAL | Status: DC | PRN

## 2022-01-30 MED ORDER — TRAZODONE HCL 100 MG PO TABS
100.0000 mg | ORAL_TABLET | Freq: Every evening | ORAL | Status: DC | PRN
Start: 2022-01-31 — End: 2022-02-01

## 2022-01-30 MED ORDER — IOHEXOL 350 MG/ML SOLN
100.0000 mL | Freq: Once | INTRAVENOUS | Status: AC | PRN
  Administered 2022-01-30: 100 mL via INTRAVENOUS

## 2022-01-30 MED ORDER — GABAPENTIN (ONCE-DAILY) 600 MG PO TABS
1800.0000 mg | ORAL_TABLET | Freq: Every evening | ORAL | Status: DC
  Administered 2022-01-30 – 2022-01-31 (×2): 1800 mg via ORAL
  Filled 2022-01-30 (×2): qty 3

## 2022-01-30 MED ORDER — FAMOTIDINE 20 MG PO TABS
20.0000 mg | ORAL_TABLET | Freq: Once | ORAL | Status: AC
  Administered 2022-01-30: 20 mg via ORAL
  Filled 2022-01-30: qty 1

## 2022-01-30 MED ORDER — VITAMIN D 25 MCG (1000 UNIT) PO TABS
2000.0000 [IU] | ORAL_TABLET | Freq: Every day | ORAL | Status: DC
  Administered 2022-01-30 – 2022-02-01 (×3): 2000 [IU] via ORAL
  Filled 2022-01-30 (×3): qty 2

## 2022-01-30 MED ORDER — SERTRALINE HCL 50 MG PO TABS
150.0000 mg | ORAL_TABLET | Freq: Every day | ORAL | Status: DC
  Administered 2022-01-31 – 2022-02-01 (×2): 150 mg via ORAL
  Filled 2022-01-30 (×2): qty 3

## 2022-01-30 MED ORDER — INSULIN ASPART 100 UNIT/ML IJ SOLN
0.0000 [IU] | Freq: Three times a day (TID) | INTRAMUSCULAR | Status: DC
  Administered 2022-01-31: 2 [IU] via SUBCUTANEOUS
  Administered 2022-01-31 – 2022-02-01 (×3): 3 [IU] via SUBCUTANEOUS
  Filled 2022-01-30 (×4): qty 1

## 2022-01-30 MED ORDER — ALPRAZOLAM 0.5 MG PO TABS
0.5000 mg | ORAL_TABLET | Freq: Every day | ORAL | Status: DC | PRN
Start: 2022-01-30 — End: 2022-02-01
  Administered 2022-01-30: 0.5 mg via ORAL
  Filled 2022-01-30: qty 1

## 2022-01-30 MED ORDER — NALOXEGOL OXALATE 25 MG PO TABS
25.0000 mg | ORAL_TABLET | Freq: Every day | ORAL | Status: DC
  Administered 2022-01-31 – 2022-02-01 (×2): 25 mg via ORAL
  Filled 2022-01-30 (×2): qty 1

## 2022-01-30 MED ORDER — CYCLOBENZAPRINE HCL 10 MG PO TABS: 10.0000 mg | ORAL_TABLET | Freq: Every evening | ORAL | Status: DC | PRN

## 2022-01-30 MED ORDER — DEXTROSE 50 % IV SOLN: 0.0000 mL | INTRAVENOUS | Status: DC | PRN

## 2022-01-30 MED ORDER — MORPHINE SULFATE (PF) 4 MG/ML IV SOLN
4.0000 mg | Freq: Once | INTRAVENOUS | Status: AC
  Administered 2022-01-30: 4 mg via INTRAVENOUS
  Filled 2022-01-30: qty 1

## 2022-01-30 MED ORDER — ATORVASTATIN CALCIUM 20 MG PO TABS
40.0000 mg | ORAL_TABLET | Freq: Every day | ORAL | Status: DC
  Administered 2022-01-31: 40 mg via ORAL
  Filled 2022-01-30: qty 2

## 2022-01-30 MED ORDER — BUPROPION HCL ER (XL) 150 MG PO TB24
300.0000 mg | ORAL_TABLET | Freq: Every day | ORAL | Status: DC
  Administered 2022-01-31 – 2022-02-01 (×2): 300 mg via ORAL
  Filled 2022-01-30 (×2): qty 2

## 2022-01-30 MED ORDER — ALUM & MAG HYDROXIDE-SIMETH 200-200-20 MG/5ML PO SUSP
15.0000 mL | Freq: Once | ORAL | Status: AC
  Administered 2022-01-30: 15 mL via ORAL
  Filled 2022-01-30: qty 30

## 2022-01-30 MED ORDER — INSULIN REGULAR(HUMAN) IN NACL 100-0.9 UT/100ML-% IV SOLN
INTRAVENOUS | Status: DC
  Administered 2022-01-30: 3.2 [IU]/h via INTRAVENOUS
  Filled 2022-01-30: qty 100

## 2022-01-30 MED ORDER — CHLORHEXIDINE GLUCONATE CLOTH 2 % EX PADS
6.0000 | MEDICATED_PAD | Freq: Every day | CUTANEOUS | Status: DC
Start: 2022-01-31 — End: 2022-02-01

## 2022-01-30 MED ORDER — DEXTROSE IN LACTATED RINGERS 5 % IV SOLN: INTRAVENOUS | Status: DC

## 2022-01-30 MED ORDER — POLYETHYLENE GLYCOL 3350 17 G PO PACK: 1.0000 | PACK | Freq: Every day | ORAL | Status: DC | PRN

## 2022-01-30 MED ORDER — MORPHINE SULFATE ER 15 MG PO TBCR
60.0000 mg | EXTENDED_RELEASE_TABLET | Freq: Two times a day (BID) | ORAL | Status: DC
  Administered 2022-01-30 – 2022-02-01 (×5): 60 mg via ORAL
  Filled 2022-01-30 (×5): qty 4

## 2022-01-30 MED ORDER — ONDANSETRON HCL 4 MG/2ML IJ SOLN
4.0000 mg | Freq: Once | INTRAMUSCULAR | Status: AC
  Administered 2022-01-30: 4 mg via INTRAVENOUS
  Filled 2022-01-30: qty 2

## 2022-01-30 MED ORDER — LACTATED RINGERS IV SOLN: INTRAVENOUS | Status: DC

## 2022-01-30 MED ORDER — POTASSIUM CHLORIDE 10 MEQ/100ML IV SOLN
10.0000 meq | INTRAVENOUS | Status: AC
  Administered 2022-01-30 (×4): 10 meq via INTRAVENOUS
  Filled 2022-01-30 (×4): qty 100

## 2022-01-30 MED ORDER — ADULT MULTIVITAMIN W/MINERALS CH
1.0000 | ORAL_TABLET | Freq: Every day | ORAL | Status: DC
Start: 2022-01-30 — End: 2022-02-01
  Administered 2022-02-01: 1 via ORAL
  Filled 2022-01-30 (×3): qty 1

## 2022-01-30 MED ORDER — POTASSIUM CHLORIDE CRYS ER 20 MEQ PO TBCR
40.0000 meq | EXTENDED_RELEASE_TABLET | Freq: Once | ORAL | Status: AC
  Administered 2022-01-30: 40 meq via ORAL
  Filled 2022-01-30: qty 2

## 2022-01-30 MED ORDER — LACTATED RINGERS IV BOLUS
2000.0000 mL | Freq: Once | INTRAVENOUS | Status: AC
  Administered 2022-01-30: 2000 mL via INTRAVENOUS

## 2022-01-30 MED ORDER — POTASSIUM CHLORIDE 10 MEQ/100ML IV SOLN
10.0000 meq | INTRAVENOUS | Status: AC
  Administered 2022-01-30 (×2): 10 meq via INTRAVENOUS
  Filled 2022-01-30 (×2): qty 100

## 2022-01-30 NOTE — Assessment & Plan Note (Signed)
Patient has a history of antiphospholipid antibody syndrome and is on chronic anticoagulation therapy with warfarin INR is supratherapeutic with no evidence of bleeding Hold warfarin for now

## 2022-01-30 NOTE — Assessment & Plan Note (Addendum)
Most likely secondary to euglycemic DKA Patient had elevated lactic acid levels and had a CT scan of abdomen and pelvis which did not show any acute findings Supportive care

## 2022-01-30 NOTE — ED Notes (Signed)
Patient transported to CT 
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
## 2022-01-30 NOTE — Assessment & Plan Note (Signed)
Patient has a history of thalamic pain syndrome Continue MS Contin, gabapentin and as needed oxycodone

## 2022-01-30 NOTE — Assessment & Plan Note (Signed)
Patient has a history of type 2 diabetes mellitus and is on metformin, Farxiga and semaglutide She presents to the ER for evaluation of abdominal pain, nausea and vomiting and has an anion gap metabolic acidosis, elevated beta hydroxybutyric acid levels and is euglycemic Concern for possible euglycemic DKA Aggressive IV fluid resuscitation Start patient on an insulin drip Check and supplement electrolytes Check serial beta hydroxybutyric acid levels

## 2022-01-30 NOTE — Assessment & Plan Note (Signed)
Secondary to GI losses from nausea and vomiting Supplement potassium Check magnesium levels

## 2022-01-30 NOTE — Assessment & Plan Note (Signed)
Continue alprazolam, sertraline, trazodone and bupropion

## 2022-01-30 NOTE — ED Provider Notes (Signed)
Vitals:   01/30/22 0730 01/30/22 0740  BP: (!) 149/96   Pulse:  (!) 103  Resp:  18  Temp:    SpO2:  100%     Heart rate improving.  Checked in with patient.  Burning feeling that her chest improved, no longer vomiting.   When asked the patient, she reports she knows she has diabetes but cannot remember exactly what medication it is.  Mother reports typically she would be able to identify what medication she is on when asked.  She is alert, oriented to mother location but seems just slightly confused.  Denies use of any alcohol.  Takes pain medications but only as prescribed.  Her mother is also at the bedside, and she reports that she seems like she has been just slightly confused today not quite acting herself mentally just seems a little bit slow.  Labs reviewed CBC with slightly elevated hemoglobin 16.4.  Metabolic panel is notable for mild hyperglycemia and a potassium of 3 as well as a slightly reduced CO2 of 21 anion gap is 16.  Her lipase is minimally elevated  Lactic acid 2.6.  Is received roughly 1 L of fluid at this time, we will repeat lactic acid now.  TSH normal troponin normal.  Patient understands awaiting imaging studies, at this point suspect patient will likely require admission to the hospital as she seems to have some very mild confusion or at least mental or cognitive impact, checking additional labs  CK, ammonia normal  Beta hydroxybutyric acid slight elevation  INR elevated, no evidence acute bleeding  CT Angio Chest PE W and/or Wo Contrast  Result Date: 01/30/2022 CLINICAL DATA:  Acute nonlocalized abdominal pain. Vomiting. History of pulmonary embolism on blood thinners. EXAM: CT ANGIOGRAPHY CHEST CT ABDOMEN AND PELVIS WITH CONTRAST TECHNIQUE: Multidetector CT imaging of the chest was performed using the standard protocol during bolus administration of intravenous contrast. Multiplanar CT image reconstructions and MIPs were obtained to evaluate the vascular  anatomy. Multidetector CT imaging of the abdomen and pelvis was performed using the standard protocol during bolus administration of intravenous contrast. RADIATION DOSE REDUCTION: This exam was performed according to the departmental dose-optimization program which includes automated exposure control, adjustment of the mA and/or kV according to patient size and/or use of iterative reconstruction technique. CONTRAST:  141m OMNIPAQUE IOHEXOL 350 MG/ML SOLN COMPARISON:  Abdominal CT 03/11/2021 FINDINGS: CTA CHEST FINDINGS Cardiovascular: Suboptimal but satisfactory opacification of the pulmonary arteries to the segmental level. No evidence of pulmonary embolism. Normal heart size. No pericardial effusion. Mediastinum/Nodes: Negative for adenopathy or mass. Lungs/Pleura: Generalized airway thickening. There is no edema, consolidation, effusion, or pneumothorax. Musculoskeletal: No acute or aggressive finding Review of the MIP images confirms the above findings. CT ABDOMEN and PELVIS FINDINGS Motion artifact. Hepatobiliary: No focal liver abnormality.Cholecystectomy. Pancreas: Unremarkable. Spleen: Unremarkable. Adrenals/Urinary Tract: Negative adrenals. No hydronephrosis or stone. Unremarkable bladder. Stomach/Bowel: No obstruction. No appendicitis or other visible bowel inflammation. Diffuse colonic stool. Vascular/Lymphatic: No acute vascular abnormality. Extensive atheromatous calcification of the aorta for age. No mass or adenopathy. Reproductive:No pathologic findings. Other: No ascites or pneumoperitoneum. Musculoskeletal: No acute abnormalities. Lumbar disc and facet degeneration. No acute or focal finding. Review of the MIP images confirms the above findings. IMPRESSION: Chest CTA: 1.  Negative for pulmonary embolism or acute finding. 2. Coronary atherosclerosis. Abdominal CT: Negative for bowel obstruction or visible inflammation. Electronically Signed   By: JJorje GuildM.D.   On: 01/30/2022 09:56   CT  ABDOMEN PELVIS  W CONTRAST  Result Date: 01/30/2022 CLINICAL DATA:  Acute nonlocalized abdominal pain. Vomiting. History of pulmonary embolism on blood thinners. EXAM: CT ANGIOGRAPHY CHEST CT ABDOMEN AND PELVIS WITH CONTRAST TECHNIQUE: Multidetector CT imaging of the chest was performed using the standard protocol during bolus administration of intravenous contrast. Multiplanar CT image reconstructions and MIPs were obtained to evaluate the vascular anatomy. Multidetector CT imaging of the abdomen and pelvis was performed using the standard protocol during bolus administration of intravenous contrast. RADIATION DOSE REDUCTION: This exam was performed according to the departmental dose-optimization program which includes automated exposure control, adjustment of the mA and/or kV according to patient size and/or use of iterative reconstruction technique. CONTRAST:  120m OMNIPAQUE IOHEXOL 350 MG/ML SOLN COMPARISON:  Abdominal CT 03/11/2021 FINDINGS: CTA CHEST FINDINGS Cardiovascular: Suboptimal but satisfactory opacification of the pulmonary arteries to the segmental level. No evidence of pulmonary embolism. Normal heart size. No pericardial effusion. Mediastinum/Nodes: Negative for adenopathy or mass. Lungs/Pleura: Generalized airway thickening. There is no edema, consolidation, effusion, or pneumothorax. Musculoskeletal: No acute or aggressive finding Review of the MIP images confirms the above findings. CT ABDOMEN and PELVIS FINDINGS Motion artifact. Hepatobiliary: No focal liver abnormality.Cholecystectomy. Pancreas: Unremarkable. Spleen: Unremarkable. Adrenals/Urinary Tract: Negative adrenals. No hydronephrosis or stone. Unremarkable bladder. Stomach/Bowel: No obstruction. No appendicitis or other visible bowel inflammation. Diffuse colonic stool. Vascular/Lymphatic: No acute vascular abnormality. Extensive atheromatous calcification of the aorta for age. No mass or adenopathy. Reproductive:No pathologic  findings. Other: No ascites or pneumoperitoneum. Musculoskeletal: No acute abnormalities. Lumbar disc and facet degeneration. No acute or focal finding. Review of the MIP images confirms the above findings. IMPRESSION: Chest CTA: 1.  Negative for pulmonary embolism or acute finding. 2. Coronary atherosclerosis. Abdominal CT: Negative for bowel obstruction or visible inflammation. Electronically Signed   By: JJorje GuildM.D.   On: 01/30/2022 09:56   CT HEAD WO CONTRAST (5MM)  Result Date: 01/30/2022 CLINICAL DATA:  Head trauma. Moderate to severe head injury 1 week ago with forehead bruising. On Coumadin. EXAM: CT HEAD WITHOUT CONTRAST TECHNIQUE: Contiguous axial images were obtained from the base of the skull through the vertex without intravenous contrast. RADIATION DOSE REDUCTION: This exam was performed according to the departmental dose-optimization program which includes automated exposure control, adjustment of the mA and/or kV according to patient size and/or use of iterative reconstruction technique. COMPARISON:  03/13/2018 FINDINGS: Brain: No evidence of acute infarction, hemorrhage, hydrocephalus, extra-axial collection or mass lesion/mass effect. Vascular: No hyperdense vessel or unexpected calcification. Skull: Normal. Negative for fracture or focal lesion. Sinuses/Orbits: Paranasal sinuses and mastoid air cells are clear. Other: None IMPRESSION: No acute intracranial abnormalities. Normal appearance of the brain. Electronically Signed   By: TKerby MoorsM.D.   On: 01/30/2022 09:54        QDelman Kitten MD 01/30/22 1014

## 2022-01-30 NOTE — ED Provider Notes (Signed)
I have placed a consult with the hospitalist for admission  Discussed case with Dr. Francine Graven, who accepts for further treatment.    Delman Kitten, MD 01/30/22 1021
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
## 2022-01-30 NOTE — ED Notes (Signed)
EDP Ward in room at this time.  Pt actively vomiting.

## 2022-01-30 NOTE — ED Triage Notes (Signed)
To triage via wheelchair. C/o abd pain to lower umbilical and abd area. Reports vomiting x several days. Pain is sharp in nature, constant. " Feels like Ive been kicked." Pt has difficulty focusing and answering questions with detail despite repeated attempts. Will not sit still in wheelchair

## 2022-01-30 NOTE — Assessment & Plan Note (Signed)
Treatment as outlined in 1 

## 2022-01-30 NOTE — Progress Notes (Signed)
30 Notified MD patient family requesting update at bedside from MD.   [place] family update per MD.   1510 Patient medication, Belenda Cruise, given to pharmacy, to be dispensed.  [place] MD of anion gap and beta labs. Endo rec transition.   1606 Continue drip  and Advance patient to clear liquid diet per MD. Plan to continue Insulin drip until repeat labs.   57 Notify MD Anion Gap 5, new order for Beta-H placed. Continue  1800 Contacted Lab about Beta H.lab to draw.    [place], lab is pending.  1852 Stop insulin drip and D5LR, no long-acting insulin, new orders to be placed per MD. SS to start at 0800 per orders.

## 2022-01-30 NOTE — ED Notes (Signed)
2nd lactic sent to lab at this time

## 2022-01-30 NOTE — Assessment & Plan Note (Signed)
History of pulmonary emboli On chronic anticoagulation therapy with Coumadin Has a supratherapeutic INR Hold Coumadin for now Check daily PT/INR

## 2022-01-30 NOTE — ED Notes (Signed)
Pt up to toilet 

## 2022-01-31 ENCOUNTER — Other Ambulatory Visit: Payer: Self-pay | Admitting: Neurology

## 2022-01-31 DIAGNOSIS — F419 Anxiety disorder, unspecified: Secondary | ICD-10-CM

## 2022-01-31 DIAGNOSIS — D6861 Antiphospholipid syndrome: Secondary | ICD-10-CM

## 2022-01-31 DIAGNOSIS — E1110 Type 2 diabetes mellitus with ketoacidosis without coma: Secondary | ICD-10-CM | POA: Diagnosis not present

## 2022-01-31 DIAGNOSIS — E876 Hypokalemia: Secondary | ICD-10-CM

## 2022-01-31 DIAGNOSIS — F32A Depression, unspecified: Secondary | ICD-10-CM

## 2022-01-31 DIAGNOSIS — R1033 Periumbilical pain: Secondary | ICD-10-CM | POA: Diagnosis not present

## 2022-01-31 LAB — LACTIC ACID, PLASMA: Lactic Acid, Venous: 1.4 mmol/L (ref 0.5–1.9)

## 2022-01-31 LAB — PROTIME-INR
INR: 3.6 — ABNORMAL HIGH (ref 0.8–1.2)
Prothrombin Time: 35.6 seconds — ABNORMAL HIGH (ref 11.4–15.2)

## 2022-01-31 LAB — BASIC METABOLIC PANEL
Anion gap: 4 — ABNORMAL LOW (ref 5–15)
BUN: 9 mg/dL (ref 6–20)
CO2: 28 mmol/L (ref 22–32)
Calcium: 8.1 mg/dL — ABNORMAL LOW (ref 8.9–10.3)
Chloride: 106 mmol/L (ref 98–111)
Creatinine, Ser: 0.52 mg/dL (ref 0.44–1.00)
GFR, Estimated: >60 mL/min (ref >60)
Glucose, Bld: 149 mg/dL — ABNORMAL HIGH (ref 70–99)
Potassium: 3.5 mmol/L (ref 3.5–5.1)
Sodium: 138 mmol/L (ref 135–145)

## 2022-01-31 LAB — GLUCOSE, CAPILLARY
Glucose-Capillary: 129 mg/dL — ABNORMAL HIGH (ref 70–99)
Glucose-Capillary: 164 mg/dL — ABNORMAL HIGH (ref 70–99)
Glucose-Capillary: 177 mg/dL — ABNORMAL HIGH (ref 70–99)
Glucose-Capillary: 142 mg/dL — ABNORMAL HIGH (ref 70–99)

## 2022-01-31 LAB — HIV ANTIBODY (ROUTINE TESTING W REFLEX): HIV Screen 4th Generation wRfx: NONREACTIVE

## 2022-01-31 NOTE — Inpatient Diabetes Management (Signed)
Inpatient Diabetes Program Recommendations  AACE/ADA: New Consensus Statement on Inpatient Glycemic Control (2015)  Target Ranges:  Prepandial:   less than 140 mg/dL      Peak postprandial:   less than 180 mg/dL (1-2 hours)      Critically ill patients:  140 - 180 mg/dL   Lab Results  Component Value Date   GLUCAP 164 (H) 01/31/2022   HGBA1C 5.6 01/30/2022    Review of Glycemic Control  Diabetes history: DM2 Outpatient Diabetes medications: Ozempic 0.25-0.5 weekly, Farxiga 10 mg QD, Metformin 1000 mg BID Current orders for Inpatient glycemic control: Novolog 0-15 units TID  Inpatient Diabetes Program Recommendations:    Discontinue Ozempic at DC.  Spoke with patient at bedside.  She states she started taking Ozempic last summer when she attended [facility].  She was not taking consistently due to cost.  She started [place] appx 6 mos ago.  She took the Ozempic last about 2 weeks ago and started feeling terrible, dizzy, lightheaded, nauseous.  Has not taken it since.  Nausea and decreased intake with Ozempic along with SGLT2i likely led her into euglycemic DKA.   She has had DM2 since she was 51 yo.  Current with her PCP.  Denies difficulties obtaining medications.  Checks her CBG regularly.    Will continue to follow while inpatient.  Thank you, Reche Dixon, MSN, [place] Diabetes Coordinator Inpatient Diabetes Program [phone] (team pager from 8a-5p)

## 2022-01-31 NOTE — Plan of Care (Signed)
  Problem: Education: Goal: Ability to describe self-care measures that may prevent or decrease complications (Diabetes Survival Skills Education) will improve Outcome: Progressing   Problem: Health Behavior/Discharge Planning: Goal: Ability to identify and utilize available resources and services will improve Outcome: Progressing Goal: Ability to manage health-related needs will improve Outcome: Progressing   

## 2022-01-31 NOTE — TOC Initial Note (Signed)
Transition of Care [facility]) - Initial/Assessment Note    Patient Details  Name: Amanda Vasquez MRN: [id number] Date of Birth: [birth year]  Transition of Care [facility]) CM/SW Contact:    Shelbie Hutching, RN Phone Number: 01/31/2022, 11:14 AM  Clinical Narrative:                  Transition of Care [facility]) Screening Note   Patient Details  Name: Amanda Vasquez Date of Birth: 1971-06-22   Transition of Care [facility]) CM/SW Contact:    Shelbie Hutching, RN Phone Number: 01/31/2022, 11:14 AM    Transition of Care Department [facility]) has reviewed patient and no TOC needs have been identified at this time. We will continue to monitor patient advancement through interdisciplinary progression rounds. If new patient transition needs arise, please place a TOC consult.          Patient Goals and CMS Choice        Expected Discharge Plan and Services                                                Prior Living Arrangements/Services                       Activities of Daily Living      Permission Sought/Granted                  Emotional Assessment              Admission diagnosis:  Shortness of breath [R06.02] Sinus tachycardia [R00.0] Hypokalemia [E87.6] Generalized abdominal pain [R10.84] Lactic acid acidosis [E87.20] Nausea and vomiting in adult [R11.2] DKA, type 2 ([place]) [E11.10] Abdominal pain [R10.9] Patient Active Problem List   Diagnosis Date Noted   Abdominal pain 01/30/2022   DKA (diabetic ketoacidosis) ([place]) 01/30/2022   Hypokalemia 01/30/2022   Bladder wall thickening 03/23/2021   DDD (degenerative disc disease), lumbar 03/22/2021   Constipation 03/22/2021   Aortic atherosclerosis ([place]) 03/12/2021   Chronic insomnia 01/20/2021   Vitamin D deficiency 10/21/2020   Hyperlipidemia associated with type 2 diabetes mellitus ([place]) 06/05/2020   Arthritis of right shoulder region 06/04/2020   Acute meniscal tear of left knee    Right shoulder  pain 05/11/2020   Chronic pain of right knee 01/21/2020   Hypotension 01/21/2020   Right knee meniscal tear 08/01/2019   Anti-phospholipid antibody syndrome ([place]) 03/22/2019   Indigestion 07/27/2018   Chronic migraine without aura, with intractable migraine, so stated, with status migrainosus 01/29/2018   Cervicalgia 01/12/2018   Acute pain of left shoulder 01/12/2018   Acute bilateral low back pain without sciatica 01/12/2018   Muscle strain 01/12/2018   Breast pain in female 10/19/2017   Vaginal bleeding 10/19/2017   Pulmonary emboli ([place]) 10/02/2017   Migraine headache 05/17/2017   Anxiety and depression 01/30/2017   Dot and blot hemorrhage, left 01/19/2017   Gastroparesis 06/22/2016   Fatigue 03/10/2016   Pre-operative cardiovascular examination 09/21/2015   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   CTS (carpal tunnel syndrome) 02/25/2015   Paresthesias 02/02/2015   Demyelinating nervous system disease or syndrome ([place]) 01/20/2015   Demyelinating disease of central nervous system ([place]) 03/01/2012   Thalamic pain syndrome (hyperesthetic) 09/07/2010   Hyperlipidemia 05/28/2010   Class 2 severe obesity with serious comorbidity and body mass index (BMI) of  38.0 to 38.9 in adult [facility]) 02/26/2010   Herpes simplex virus (HSV) infection 12/21/2009   ALLERGIC RHINITIS 12/21/2009   BACK PAIN, THORACIC REGION 11/11/2009   Diabetes mellitus, type II ([place]) 07/28/2008   AMENORRHEA 12/17/2007   DISORDER, BIPOLAR, ATYPICAL MANIC 05/14/2007   PCP:  McLean-Scocuzza, Nino Glow, MD Pharmacy:   [place], [place] - [address] [place] [address] Phone: [phone] Fax: [phone]  [facility] - [place], [place] [address] [place] [address] Phone: [phone] Fax: [phone]     Social Determinants of Health (SDOH) Interventions    Readmission Risk Interventions     No data to display

## 2022-01-31 NOTE — Progress Notes (Addendum)
PROGRESS NOTE    KELISHA Vasquez  XAJ:[id number] DOB: May 14, 1971 DOA: 01/30/2022 PCP: McLean-Scocuzza, Nino Glow, MD    Brief Narrative:  Amanda Vasquez is a 51 y.o. female with medical history significant for morbid obesity status post 100 pound weight loss in the last 1 year, history of antiphospholipid antibody syndrome, chronic constipation, thalamic pain syndrome, generalized anxiety disorder, history of pulmonary embolism who presents to the emergency room for evaluation of abdominal pain, nausea and vomiting. Patient states that she has been on Ozempic intermittently for 1 year and has had side effects from using it.  She complains of nausea and vomiting for 4 days and abdominal pain for the last 1 week which worsened on the day of admission. Abdominal pain is mostly in the periumbilical area, nonradiating and rated an 8 x 10 in intensity at its worst.  It has been associated with nausea and vomiting.  She denies having any diarrhea and has a history of chronic constipation but states that she has had daily bowel movements over the last several days. Her last Ozempic use was 2 weeks ago. She denies having any fever, no chills, no cough, no headache, no dizziness, no lightheadedness, no urinary frequency, no nocturia, no dysuria, no chest pain, no leg swelling, no focal deficits or blurred vision, no hematemesis, no melena stools or hematochezia Labs reveal elevated beta hydroxybutyric acid at 2.71, lactic acidosis and an anion gap metabolic acidosis. She will be admitted to the hospital for further evaluation.    Consultants:    Procedures:  Antimicrobials:      Subjective:  Feels better. Tolerating po intake. Sore abd. No further n/v  Objective: Vitals:   01/31/22 0800 01/31/22 0900 01/31/22 1000 01/31/22 1100  BP: 108/71 118/87 (!) 125/91 (!) 145/75  Pulse: 81 89 92 82  Resp: '18 12 12 12  '$ Temp: (!) 97.3 F (36.3 C)     TempSrc: Oral     SpO2: 99%     Weight:      Height:         Intake/Output Summary (Last 24 hours) at 01/31/2022 1406 Last data filed at 01/30/2022 2142 Gross per 24 hour  Intake 1229.08 ml  Output --  Net 1229.08 ml   Filed Weights   01/30/22 0634  Weight: 70.3 kg    Examination: Calm, NAD Cta no w/r Reg s1/s2 no gallop Soft benign +bs No edema Aaoxox3  Mood and affect appropriate in current setting     Data Reviewed: I have personally reviewed following labs and imaging studies  CBC: Recent Labs  Lab 01/30/22 0711  WBC 7.1  NEUTROABS 4.7  HGB 16.4*  HCT 45.9  MCV 80.2  PLT 094   Basic Metabolic Panel: Recent Labs  Lab 01/30/22 0711 01/30/22 0829 01/30/22 1241 01/30/22 1626 01/30/22 1825 01/30/22 2255 01/31/22 0307  NA 135  --  135 137 136 137 138  K 3.0*  --  3.0* 3.2* 3.5 3.4* 3.5  CL 98  --  100 105 104 107 106  CO2 21*  --  '24 27 26 25 28  '$ GLUCOSE 222*  --  140* 161* 157* 146* 149*  BUN 20  --  '15 13 11 11 9  '$ CREATININE 0.72  --  0.61 0.54 0.63 0.46 0.52  CALCIUM 9.4  --  8.6* 8.6* 8.4* 8.2* 8.1*  MG 1.8 1.9  --   --   --   --   --    GFR: Estimated Creatinine  Clearance: 81.8 mL/min (by C-G formula based on SCr of 0.52 mg/dL). Liver Function Tests: Recent Labs  Lab 01/30/22 0711  AST 22  ALT 14  ALKPHOS 50  BILITOT 1.6*  PROT 7.3  ALBUMIN 4.1   Recent Labs  Lab 01/30/22 0711  LIPASE 73*   Recent Labs  Lab 01/30/22 0829  AMMONIA <10   Coagulation Profile: Recent Labs  Lab 01/30/22 0711 01/31/22 0307  INR 5.9* 3.6*   Cardiac Enzymes: Recent Labs  Lab 01/30/22 0829  CKTOTAL 101   BNP (last 3 results) No results for input(s): "PROBNP" in the last 8760 hours. HbA1C: Recent Labs    01/30/22 0711 01/30/22 1241  HGBA1C 5.8* 5.6   CBG: Recent Labs  Lab 01/30/22 1803 01/30/22 1919 01/30/22 2106 01/31/22 0734 01/31/22 1135  GLUCAP 155* 127* 124* 129* 164*   Lipid Profile: No results for input(s): "CHOL", "HDL", "LDLCALC", "TRIG", "CHOLHDL", "LDLDIRECT" in the last  72 hours. Thyroid Function Tests: Recent Labs    01/30/22 0711  TSH 0.487   Anemia Panel: No results for input(s): "VITAMINB12", "FOLATE", "FERRITIN", "TIBC", "IRON", "RETICCTPCT" in the last 72 hours. Sepsis Labs: Recent Labs  Lab 01/30/22 0711 01/30/22 0829 01/31/22 0942  LATICACIDVEN 2.6* 2.8* 1.4    Recent Results (from the past 240 hour(s))  MRSA Next Gen by PCR, Nasal     Status: Amanda Vasquez   Collection Time: 01/30/22 11:51 AM   Specimen: Nasal Mucosa; Nasal Swab  Result Value Ref Range Status   MRSA by PCR Next Gen NOT DETECTED NOT DETECTED Final    Comment: (NOTE) The GeneXpert MRSA Assay (FDA approved for NASAL specimens only), is one component of a comprehensive MRSA colonization surveillance program. It is not intended to diagnose MRSA infection nor to guide or monitor treatment for MRSA infections. Test performance is not FDA approved in patients less than 44 years old. Performed at [facility], [address] [place], [place] [address]          Radiology Studies: CT Angio Chest PE W and/or Wo Contrast  Result Date: 01/30/2022 CLINICAL DATA:  Acute nonlocalized abdominal pain. Vomiting. History of pulmonary embolism on blood thinners. EXAM: CT ANGIOGRAPHY CHEST CT ABDOMEN AND PELVIS WITH CONTRAST TECHNIQUE: Multidetector CT imaging of the chest was performed using the standard protocol during bolus administration of intravenous contrast. Multiplanar CT image reconstructions and MIPs were obtained to evaluate the vascular anatomy. Multidetector CT imaging of the abdomen and pelvis was performed using the standard protocol during bolus administration of intravenous contrast. RADIATION DOSE REDUCTION: This exam was performed according to the departmental dose-optimization program which includes automated exposure control, adjustment of the mA and/or kV according to patient size and/or use of iterative reconstruction technique. CONTRAST:  134m OMNIPAQUE  IOHEXOL 350 MG/ML SOLN COMPARISON:  Abdominal CT 03/11/2021 FINDINGS: CTA CHEST FINDINGS Cardiovascular: Suboptimal but satisfactory opacification of the pulmonary arteries to the segmental level. No evidence of pulmonary embolism. Normal heart size. No pericardial effusion. Mediastinum/Nodes: Negative for adenopathy or mass. Lungs/Pleura: Generalized airway thickening. There is no edema, consolidation, effusion, or pneumothorax. Musculoskeletal: No acute or aggressive finding Review of the MIP images confirms the above findings. CT ABDOMEN and PELVIS FINDINGS Motion artifact. Hepatobiliary: No focal liver abnormality.Cholecystectomy. Pancreas: Unremarkable. Spleen: Unremarkable. Adrenals/Urinary Tract: Negative adrenals. No hydronephrosis or stone. Unremarkable bladder. Stomach/Bowel: No obstruction. No appendicitis or other visible bowel inflammation. Diffuse colonic stool. Vascular/Lymphatic: No acute vascular abnormality. Extensive atheromatous calcification of the aorta for age. No mass or adenopathy.  Reproductive:No pathologic findings. Other: No ascites or pneumoperitoneum. Musculoskeletal: No acute abnormalities. Lumbar disc and facet degeneration. No acute or focal finding. Review of the MIP images confirms the above findings. IMPRESSION: Chest CTA: 1.  Negative for pulmonary embolism or acute finding. 2. Coronary atherosclerosis. Abdominal CT: Negative for bowel obstruction or visible inflammation. Electronically Signed   By: Jorje Guild M.D.   On: 01/30/2022 09:56   CT ABDOMEN PELVIS W CONTRAST  Result Date: 01/30/2022 CLINICAL DATA:  Acute nonlocalized abdominal pain. Vomiting. History of pulmonary embolism on blood thinners. EXAM: CT ANGIOGRAPHY CHEST CT ABDOMEN AND PELVIS WITH CONTRAST TECHNIQUE: Multidetector CT imaging of the chest was performed using the standard protocol during bolus administration of intravenous contrast. Multiplanar CT image reconstructions and MIPs were obtained to  evaluate the vascular anatomy. Multidetector CT imaging of the abdomen and pelvis was performed using the standard protocol during bolus administration of intravenous contrast. RADIATION DOSE REDUCTION: This exam was performed according to the departmental dose-optimization program which includes automated exposure control, adjustment of the mA and/or kV according to patient size and/or use of iterative reconstruction technique. CONTRAST:  176m OMNIPAQUE IOHEXOL 350 MG/ML SOLN COMPARISON:  Abdominal CT 03/11/2021 FINDINGS: CTA CHEST FINDINGS Cardiovascular: Suboptimal but satisfactory opacification of the pulmonary arteries to the segmental level. No evidence of pulmonary embolism. Normal heart size. No pericardial effusion. Mediastinum/Nodes: Negative for adenopathy or mass. Lungs/Pleura: Generalized airway thickening. There is no edema, consolidation, effusion, or pneumothorax. Musculoskeletal: No acute or aggressive finding Review of the MIP images confirms the above findings. CT ABDOMEN and PELVIS FINDINGS Motion artifact. Hepatobiliary: No focal liver abnormality.Cholecystectomy. Pancreas: Unremarkable. Spleen: Unremarkable. Adrenals/Urinary Tract: Negative adrenals. No hydronephrosis or stone. Unremarkable bladder. Stomach/Bowel: No obstruction. No appendicitis or other visible bowel inflammation. Diffuse colonic stool. Vascular/Lymphatic: No acute vascular abnormality. Extensive atheromatous calcification of the aorta for age. No mass or adenopathy. Reproductive:No pathologic findings. Other: No ascites or pneumoperitoneum. Musculoskeletal: No acute abnormalities. Lumbar disc and facet degeneration. No acute or focal finding. Review of the MIP images confirms the above findings. IMPRESSION: Chest CTA: 1.  Negative for pulmonary embolism or acute finding. 2. Coronary atherosclerosis. Abdominal CT: Negative for bowel obstruction or visible inflammation. Electronically Signed   By: JJorje GuildM.D.   On:  01/30/2022 09:56   CT HEAD WO CONTRAST (5MM)  Result Date: 01/30/2022 CLINICAL DATA:  Head trauma. Moderate to severe head injury 1 week ago with forehead bruising. On Coumadin. EXAM: CT HEAD WITHOUT CONTRAST TECHNIQUE: Contiguous axial images were obtained from the base of the skull through the vertex without intravenous contrast. RADIATION DOSE REDUCTION: This exam was performed according to the departmental dose-optimization program which includes automated exposure control, adjustment of the mA and/or kV according to patient size and/or use of iterative reconstruction technique. COMPARISON:  03/13/2018 FINDINGS: Brain: No evidence of acute infarction, hemorrhage, hydrocephalus, extra-axial collection or mass lesion/mass effect. Vascular: No hyperdense vessel or unexpected calcification. Skull: Normal. Negative for fracture or focal lesion. Sinuses/Orbits: Paranasal sinuses and mastoid air cells are clear. Other: Amanda Vasquez IMPRESSION: No acute intracranial abnormalities. Normal appearance of the brain. Electronically Signed   By: TKerby MoorsM.D.   On: 01/30/2022 09:54        Scheduled Meds:  atorvastatin  40 mg Oral q1800   buPROPion  300 mg Oral Daily   calcium carbonate  1,250 mg Oral Q breakfast   Chlorhexidine Gluconate Cloth  6 each Topical Q0600   cholecalciferol  2,000 Units Oral Daily  Gabapentin (Once-Daily)  1,800 mg Oral QPM   insulin aspart  0-15 Units Subcutaneous TID WC   morphine  60 mg Oral Q12H   multivitamin with minerals  1 tablet Oral Daily   naloxegol oxalate  25 mg Oral Daily   propranolol ER  80 mg Oral Daily   sertraline  150 mg Oral Daily   Continuous Infusions:  Assessment & Plan:   Principal Problem:   Abdominal pain Active Problems:   DKA (diabetic ketoacidosis) ([name])   Diabetes mellitus, type II ([place])   Pulmonary emboli ([name])   Anti-phospholipid antibody syndrome ([name])   Thalamic pain syndrome (hyperesthetic)   Anxiety and depression    Hypokalemia  Abdominal pain Most likely secondary to euglycemic DKA Patient had elevated lactic acid levels and had a CT scan of abdomen and pelvis which did not show any acute findings 6/12 improving with supportive care Would like to continue on liquid diet for today and will advance as tolerated   DKA (diabetic ketoacidosis) ([place]) Diabetes mellitus, type II ([place]) Patient has a history of type 2 diabetes mellitus and is on metformin, Farxiga and semaglutide She presents to the ER for evaluation of abdominal pain, nausea and vomiting and has an anion gap metabolic acidosis, elevated beta hydroxybutyric acid levels and is euglycemic Concern for possible euglycemic DKA 6/12 improved with IV fluids Lactic acid normal now. Normal anion gap Recommended patient to avoid using Ozempic in near future      Anti-phospholipid antibody syndrome [facility]) Patient has a history of antiphospholipid antibody syndrome and is on chronic anticoagulation therapy with warfarin 6/12 INR supratherapeutic No evidence of bleed Continue holding Coumadin   Pulmonary emboli ([name]) History of pulmonary emboli On chronic anticoagulation therapy with Coumadin 6/12 on Coumadin however held due to elevated INR     Thalamic pain syndrome (hyperesthetic) Patient has a history of thalamic pain syndrome Continue MS Contin, gabapentin and as needed oxycodone   Hypokalemia Secondary to GI losses from nausea and vomiting Supplemented. Mg nml   Anxiety and depression Continue alprazolam, sertraline, trazodone and bupropion   DVT prophylaxis: inr elevated. On coumadin Code Status:full Family Communication: Amanda Vasquez at bedside Disposition Plan:  Status is: Inpatient Remains inpatient appropriate because: IV treament        LOS: 1 day   Time spent: 80 min    Nolberto Hanlon, MD [facility] Pager [phone]  If 7PM-7AM, please contact night-coverage 01/31/2022, 2:06 PM

## 2022-02-01 ENCOUNTER — Encounter: Payer: Self-pay | Admitting: Oncology

## 2022-02-01 ENCOUNTER — Encounter: Payer: Self-pay | Admitting: *Deleted

## 2022-02-01 ENCOUNTER — Telehealth: Payer: Self-pay | Admitting: *Deleted

## 2022-02-01 LAB — PROTIME-INR
INR: 2.7 — ABNORMAL HIGH (ref 0.8–1.2)
INR: 2.7 — AB (ref 0.80–1.20)
Prothrombin Time: 28.1 seconds — ABNORMAL HIGH (ref 11.4–15.2)

## 2022-02-01 LAB — GLUCOSE, CAPILLARY
Glucose-Capillary: 165 mg/dL — ABNORMAL HIGH (ref 70–99)
Glucose-Capillary: 237 mg/dL — ABNORMAL HIGH (ref 70–99)

## 2022-02-01 NOTE — TOC Initial Note (Signed)
Transition of Care [facility]) - Initial/Assessment Note    Patient Details  Name: KURSTEN KRUK MRN: [id number] Date of Birth: [birth year]  Transition of Care [facility]) CM/SW Contact:    Laurena Slimmer, RN Phone Number: 02/01/2022, 9:37 AM  Clinical Narrative:                  Transition of Care [facility]) Screening Note   Patient Details  Name: CHANTA BAUERS Date of Birth: 1970/08/28   Transition of Care [facility]) CM/SW Contact:    Laurena Slimmer, RN Phone Number: 02/01/2022, 9:37 AM    Transition of Care Department [facility]) has reviewed patient and no TOC needs have been identified at this time. We will continue to monitor patient advancement through interdisciplinary progression rounds. If new patient transition needs arise, please place a TOC consult.          Patient Goals and CMS Choice        Expected Discharge Plan and Services           Expected Discharge Date: 02/01/22                                    Prior Living Arrangements/Services                       Activities of Daily Living      Permission Sought/Granted                  Emotional Assessment              Admission diagnosis:  Shortness of breath [R06.02] Sinus tachycardia [R00.0] Hypokalemia [E87.6] Generalized abdominal pain [R10.84] Lactic acid acidosis [E87.20] Nausea and vomiting in adult [R11.2] DKA, type 2 ([place]) [E11.10] Abdominal pain [R10.9] Patient Active Problem List   Diagnosis Date Noted   Abdominal pain 01/30/2022   DKA (diabetic ketoacidosis) ([place]) 01/30/2022   Hypokalemia 01/30/2022   Bladder wall thickening 03/23/2021   DDD (degenerative disc disease), lumbar 03/22/2021   Constipation 03/22/2021   Aortic atherosclerosis ([place]) 03/12/2021   Chronic insomnia 01/20/2021   Vitamin D deficiency 10/21/2020   Hyperlipidemia associated with type 2 diabetes mellitus ([place]) 06/05/2020   Arthritis of right shoulder region 06/04/2020   Acute meniscal tear of  left knee    Right shoulder pain 05/11/2020   Chronic pain of right knee 01/21/2020   Hypotension 01/21/2020   Right knee meniscal tear 08/01/2019   Anti-phospholipid antibody syndrome ([place]) 03/22/2019   Indigestion 07/27/2018   Chronic migraine without aura, with intractable migraine, so stated, with status migrainosus 01/29/2018   Cervicalgia 01/12/2018   Acute pain of left shoulder 01/12/2018   Acute bilateral low back pain without sciatica 01/12/2018   Muscle strain 01/12/2018   Breast pain in female 10/19/2017   Vaginal bleeding 10/19/2017   Pulmonary emboli ([place]) 10/02/2017   Migraine headache 05/17/2017   Anxiety and depression 01/30/2017   Dot and blot hemorrhage, left 01/19/2017   Gastroparesis 06/22/2016   Fatigue 03/10/2016   Pre-operative cardiovascular examination 09/21/2015   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   CTS (carpal tunnel syndrome) 02/25/2015   Paresthesias 02/02/2015   Demyelinating nervous system disease or syndrome ([place]) 01/20/2015   Demyelinating disease of central nervous system ([place]) 03/01/2012   Thalamic pain syndrome (hyperesthetic) 09/07/2010   Hyperlipidemia 05/28/2010   Class 2 severe obesity with serious comorbidity and body mass index (  BMI) of 38.0 to 38.9 in adult [facility]) 02/26/2010   Herpes simplex virus (HSV) infection 12/21/2009   ALLERGIC RHINITIS 12/21/2009   BACK PAIN, THORACIC REGION 11/11/2009   Diabetes mellitus, type II ([place]) 07/28/2008   AMENORRHEA 12/17/2007   DISORDER, BIPOLAR, ATYPICAL MANIC 05/14/2007   PCP:  McLean-Scocuzza, Nino Glow, MD Pharmacy:   [place], [place] - [address] [address] Phone: [phone] Fax: [phone]  [facility] - [place], [place] [address] [place] [address] Phone: [phone] Fax: [phone]     Social Determinants of Health (SDOH) Interventions    Readmission Risk Interventions     No data to display

## 2022-02-01 NOTE — Plan of Care (Signed)
Problem: Education: Goal: Knowledge of General Education information will improve Description: Including pain rating scale, medication(s)/side effects and non-pharmacologic comfort measures 02/01/2022 1009 by Alen Blew, RN Outcome: Adequate for Discharge 02/01/2022 1009 by Alen Blew, RN Outcome: Adequate for Discharge   Problem: Health Behavior/Discharge Planning: Goal: Ability to manage health-related needs will improve 02/01/2022 1009 by Alen Blew, RN Outcome: Adequate for Discharge 02/01/2022 1009 by Alen Blew, RN Outcome: Adequate for Discharge   Problem: Clinical Measurements: Goal: Ability to maintain clinical measurements within normal limits will improve 02/01/2022 1009 by Roanna Epley D, RN Outcome: Adequate for Discharge 02/01/2022 1009 by Alen Blew, RN Outcome: Adequate for Discharge Goal: Will remain free from infection 02/01/2022 1009 by Roanna Epley D, RN Outcome: Adequate for Discharge 02/01/2022 1009 by Alen Blew, RN Outcome: Adequate for Discharge Goal: Diagnostic test results will improve 02/01/2022 1009 by Alen Blew, RN Outcome: Adequate for Discharge 02/01/2022 1009 by Alen Blew, RN Outcome: Adequate for Discharge Goal: Respiratory complications will improve 02/01/2022 1009 by Alen Blew, RN Outcome: Adequate for Discharge 02/01/2022 1009 by Alen Blew, RN Outcome: Adequate for Discharge Goal: Cardiovascular complication will be avoided 02/01/2022 1009 by Alen Blew, RN Outcome: Adequate for Discharge 02/01/2022 1009 by Alen Blew, RN Outcome: Adequate for Discharge   Problem: Activity: Goal: Risk for activity intolerance will decrease 02/01/2022 1009 by Alen Blew, RN Outcome: Adequate for Discharge 02/01/2022 1009 by Alen Blew, RN Outcome: Adequate for Discharge   Problem: Coping: Goal: Level of anxiety will decrease 02/01/2022 1009 by Alen Blew,  RN Outcome: Adequate for Discharge 02/01/2022 1009 by Alen Blew, RN Outcome: Adequate for Discharge   Problem: Elimination: Goal: Will not experience complications related to bowel motility 02/01/2022 1009 by Alen Blew, RN Outcome: Adequate for Discharge 02/01/2022 1009 by Alen Blew, RN Outcome: Adequate for Discharge Goal: Will not experience complications related to urinary retention 02/01/2022 1009 by Alen Blew, RN Outcome: Adequate for Discharge 02/01/2022 1009 by Alen Blew, RN Outcome: Adequate for Discharge   Problem: Safety: Goal: Ability to remain free from injury will improve 02/01/2022 1009 by Alen Blew, RN Outcome: Adequate for Discharge 02/01/2022 1009 by Alen Blew, RN Outcome: Adequate for Discharge   Problem: Pain Managment: Goal: General experience of comfort will improve 02/01/2022 1009 by Alen Blew, RN Outcome: Adequate for Discharge 02/01/2022 1009 by Alen Blew, RN Outcome: Adequate for Discharge   Problem: Skin Integrity: Goal: Risk for impaired skin integrity will decrease 02/01/2022 1009 by Alen Blew, RN Outcome: Adequate for Discharge 02/01/2022 1009 by Alen Blew, RN Outcome: Adequate for Discharge   Problem: Education: Goal: Ability to describe self-care measures that may prevent or decrease complications (Diabetes Survival Skills Education) will improve 02/01/2022 1009 by Alen Blew, RN Outcome: Adequate for Discharge 02/01/2022 1009 by Alen Blew, RN Outcome: Adequate for Discharge Goal: Individualized Educational Video(s) 02/01/2022 1009 by Alen Blew, RN Outcome: Adequate for Discharge 02/01/2022 1009 by Alen Blew, RN Outcome: Adequate for Discharge   Problem: Fluid Volume: Goal: Ability to maintain a balanced intake and output will improve 02/01/2022 1009 by Alen Blew, RN Outcome: Adequate for Discharge 02/01/2022 1009 by Alen Blew,  RN Outcome: Adequate for Discharge   Problem: Health Behavior/Discharge Planning: Goal: Ability to identify and utilize available resources and services will improve 02/01/2022 1009 by Alen Blew, RN Outcome: Adequate  for Discharge 02/01/2022 1009 by Alen Blew, RN Outcome: Adequate for Discharge Goal: Ability to manage health-related needs will improve 02/01/2022 1009 by Alen Blew, RN Outcome: Adequate for Discharge 02/01/2022 1009 by Alen Blew, RN Outcome: Adequate for Discharge   Problem: Nutritional: Goal: Maintenance of adequate nutrition will improve 02/01/2022 1009 by Alen Blew, RN Outcome: Adequate for Discharge 02/01/2022 1009 by Alen Blew, RN Outcome: Adequate for Discharge Goal: Progress toward achieving an optimal weight will improve 02/01/2022 1009 by Alen Blew, RN Outcome: Adequate for Discharge 02/01/2022 1009 by Alen Blew, RN Outcome: Adequate for Discharge   Problem: Skin Integrity: Goal: Risk for impaired skin integrity will decrease 02/01/2022 1009 by Alen Blew, RN Outcome: Adequate for Discharge 02/01/2022 1009 by Alen Blew, RN Outcome: Adequate for Discharge   Problem: Education: Goal: Ability to describe self-care measures that may prevent or decrease complications (Diabetes Survival Skills Education) will improve 02/01/2022 1009 by Alen Blew, RN Outcome: Adequate for Discharge 02/01/2022 1009 by Alen Blew, RN Outcome: Adequate for Discharge Goal: Individualized Educational Video(s) 02/01/2022 1009 by Alen Blew, RN Outcome: Adequate for Discharge 02/01/2022 1009 by Alen Blew, RN Outcome: Adequate for Discharge   Problem: Cardiac: Goal: Ability to maintain an adequate cardiac output will improve 02/01/2022 1009 by Alen Blew, RN Outcome: Adequate for Discharge 02/01/2022 1009 by Alen Blew, RN Outcome: Adequate for Discharge   Problem: Fluid  Volume: Goal: Ability to achieve a balanced intake and output will improve 02/01/2022 1009 by Alen Blew, RN Outcome: Adequate for Discharge 02/01/2022 1009 by Alen Blew, RN Outcome: Adequate for Discharge   Problem: Metabolic: Goal: Ability to maintain appropriate glucose levels will improve 02/01/2022 1009 by Alen Blew, RN Outcome: Adequate for Discharge 02/01/2022 1009 by Alen Blew, RN Outcome: Adequate for Discharge   Problem: Nutritional: Goal: Maintenance of adequate nutrition will improve 02/01/2022 1009 by Alen Blew, RN Outcome: Adequate for Discharge 02/01/2022 1009 by Alen Blew, RN Outcome: Adequate for Discharge Goal: Maintenance of adequate weight for body size and type will improve 02/01/2022 1009 by Alen Blew, RN Outcome: Adequate for Discharge 02/01/2022 1009 by Alen Blew, RN Outcome: Adequate for Discharge

## 2022-02-01 NOTE — Discharge Summary (Signed)
Amanda Vasquez XHB:[id number] DOB: 01/31/1971 DOA: 01/30/2022  PCP: McLean-Scocuzza, Nino Glow, MD  Admit date: 01/30/2022 Discharge date: 02/01/2022  Admitted From: home Disposition:  home  Recommendations for Outpatient Follow-up:  Follow up with PCP in 1 week Please obtain BMP/CBC in one week      Discharge Condition:Stable CODE STATUS:full  Diet recommendation: Heart Healthy/ Carb Modified  Brief/Interim Summary: Per [name] is a 51 y.o. female with medical history significant for morbid obesity status post 100 pound weight loss in the last 1 year, history of antiphospholipid antibody syndrome, chronic constipation, thalamic pain syndrome, generalized anxiety disorder, history of pulmonary embolism who presents to the emergency room for evaluation of abdominal pain, nausea and vomiting. Patient states that she has been on Ozempic intermittently for 1 year and has had side effects from using it.  She complains of nausea and vomiting for 4 days and abdominal pain for the last 1 week which worsened on the day of admission. Abdominal pain is mostly in the periumbilical area, nonradiating and rated an 8 x 10 in intensity at its worst.  It has been associated with nausea and vomiting.  She denies having any diarrhea and has a history of chronic constipation but states that she has had daily bowel movements over the last several days. Her last Ozempic use was 2 weeks ago. She denies having any fever, no chills, no cough, no headache, no dizziness, no lightheadedness, no urinary frequency, no nocturia, no dysuria, no chest pain, no leg swelling, no focal deficits or blurred vision, no hematemesis, no melena stools or hematochezia Labs reveal elevated beta hydroxybutyric acid at 2.71, lactic acidosis and an anion gap metabolic acidosis.   Abdominal pain Most likely secondary to euglycemic DKA Patient had elevated lactic acid levels and had a CT scan of abdomen and pelvis which did not  show any acute findings improving with supportive care    DKA (diabetic ketoacidosis) ([place]) Diabetes mellitus, type II ([place]) Patient has a history of type 2 diabetes mellitus and is on metformin, Farxiga and semaglutide She presents to the ER for evaluation of abdominal pain, nausea and vomiting and has an anion gap metabolic acidosis, elevated beta hydroxybutyric acid levels and is euglycemic Concern for possible euglycemic DKA improved with IV fluids Lactic acid normal now. Normal anion gap Recommended patient to avoid using Ozempic in near future         Anti-phospholipid antibody syndrome [facility]) Patient has a history of antiphospholipid antibody syndrome and is on chronic anticoagulation therapy with warfarin INR supratherapeutic, held , now within acceptable range No evidence of bleed    Pulmonary emboli ([name]) History of pulmonary emboli On chronic anticoagulation therapy with Coumadin        Thalamic pain syndrome (hyperesthetic) Patient has a history of thalamic pain syndrome Continue home meds/management   Hypokalemia Secondary to GI losses from nausea and vomiting Was Supplemented. Mg nml   Anxiety and depression Continue home regimen     Discharge Diagnoses:  Principal Problem:   Abdominal pain Active Problems:   DKA (diabetic ketoacidosis) ([place])   Diabetes mellitus, type II ([place])   Pulmonary emboli ([name])   Anti-phospholipid antibody syndrome ([name])   Thalamic pain syndrome (hyperesthetic)   Anxiety and depression   Hypokalemia    Discharge Instructions  Discharge Instructions     Call MD for:  severe uncontrolled pain   Complete by: As directed    Diet - low sodium heart healthy   Complete by:  As directed    Discharge instructions   Complete by: As directed    INR today 2.7. go by what you do at home. Reck in couple of days Hydrate with water! Avoid Ozempic F/u with pcp , need labs   Increase activity slowly   Complete by: As directed        Allergies as of 02/01/2022       Reactions   Latex Hives        Medication List     STOP taking these medications    acetaminophen 650 MG CR tablet Commonly known as: TYLENOL   enoxaparin 100 MG/ML injection Commonly known as: Lovenox   Semaglutide (1 MG/DOSE) 4 MG/3ML Sopn       TAKE these medications    ALPRAZolam 0.5 MG tablet Commonly known as: XANAX Take 1 tablet (0.5 mg total) by mouth daily as needed. for anxiety   atorvastatin 40 MG tablet Commonly known as: LIPITOR Take 1 tablet (40 mg total) by mouth daily at 6 PM.   Botox 200 units Solr Generic drug: Botulinum Toxin Type A Provider to inject 155 units into the muscles of the head and neck every 3 months. Discard remainder. What changed:  how much to take how to take this when to take this   buPROPion 300 MG 24 hr tablet Commonly known as: WELLBUTRIN XL TAKE 1 TABLET BY MOUTH DAILY What changed: additional instructions   calcium carbonate 1500 (600 Ca) MG Tabs tablet Commonly known as: OSCAL Take 1,500 mg by mouth daily with breakfast.   Cetirizine HCl 10 MG Caps Take 1 capsule (10 mg total) by mouth daily as needed. Take by mouth.   cyclobenzaprine 10 MG tablet Commonly known as: FLEXERIL Take 1 tablet (10 mg total) by mouth at bedtime.   dapagliflozin propanediol 10 MG Tabs tablet Commonly known as: Farxiga Take 1 tablet (10 mg total) by mouth daily before breakfast.   Emgality 120 MG/ML Soaj Generic drug: Galcanezumab-gnlm Inject 120 mg into the skin every 30 (thirty) days.   Gralise 600 MG Tabs Generic drug: Gabapentin (Once-Daily) Take 1,800 mg by mouth every evening.   metFORMIN 1000 MG tablet Commonly known as: GLUCOPHAGE TAKE ONE (1) TABLET BY MOUTH TWO TIMES PER DAY WITH MEALS What changed: See the new instructions.   MIRALAX PO Take 17 g by mouth daily as needed.   morphine 60 MG 12 hr tablet Commonly known as: MS CONTIN Take 60 mg by mouth every 12 (twelve)  hours.   multivitamin tablet Take 1 tablet by mouth daily.   naloxegol oxalate 25 MG Tabs tablet Commonly known as: MOVANTIK Take 25 mg by mouth daily.   NON FORMULARY CBD Gummies   Nurtec 75 MG Tbdp Generic drug: Rimegepant Sulfate Take 75 mg by mouth daily as needed. For migraines. Take as close to onset of migraine as possible. One daily maximum.   oxyCODONE 5 MG immediate release tablet Commonly known as: Oxy IR/ROXICODONE Take 1 tablet (5 mg total) by mouth every 4 (four) hours as needed for severe pain. Per pain clinic Dr Hardin Negus   promethazine 25 MG tablet Commonly known as: PHENERGAN Take 1 tablet (25 mg total) by mouth 2 (two) times daily as needed for nausea or vomiting.   propranolol ER 80 MG 24 hr capsule Commonly known as: INDERAL LA Take 1 capsule (80 mg total) by mouth daily.   sertraline 100 MG tablet Commonly known as: ZOLOFT TAKE 1.5 TABLETS BY MOUTH DAILY. What changed:  See the new instructions.   traZODone 100 MG tablet Commonly known as: DESYREL Take 1 tablet (100 mg total) by mouth at bedtime as needed. for sleep   valACYclovir 1000 MG tablet Commonly known as: VALTREX Take 1 tablet (1,000 mg total) by mouth 2 (two) times daily. Bid with outbreak with food x 3-7 days   Vitamin D 50 MCG (2000 UT) Caps Take 2,000 Units by mouth daily.   warfarin 10 MG tablet Commonly known as: COUMADIN TAKE 1 TABLET BY MOUTH DAILY FOR 6 DAYS A WEEK What changed: See the new instructions.   warfarin 5 MG tablet Commonly known as: COUMADIN Take 1 tablet (5 mg total) by mouth once a week. Pt will take 10 mg 6 days a week and then 1 day take 5 mg. What changed:  how much to take additional instructions        Follow-up Information     McLean-Scocuzza, Nino Glow, MD Follow up in 1 week(s).   Specialty: Internal Medicine Contact information: [address] [place] [address] [phone]                Allergies  Allergen Reactions    Latex Hives    Consultations:    Procedures/Studies: CT Angio Chest PE W and/or Wo Contrast  Result Date: 01/30/2022 CLINICAL DATA:  Acute nonlocalized abdominal pain. Vomiting. History of pulmonary embolism on blood thinners. EXAM: CT ANGIOGRAPHY CHEST CT ABDOMEN AND PELVIS WITH CONTRAST TECHNIQUE: Multidetector CT imaging of the chest was performed using the standard protocol during bolus administration of intravenous contrast. Multiplanar CT image reconstructions and MIPs were obtained to evaluate the vascular anatomy. Multidetector CT imaging of the abdomen and pelvis was performed using the standard protocol during bolus administration of intravenous contrast. RADIATION DOSE REDUCTION: This exam was performed according to the departmental dose-optimization program which includes automated exposure control, adjustment of the mA and/or kV according to patient size and/or use of iterative reconstruction technique. CONTRAST:  160m OMNIPAQUE IOHEXOL 350 MG/ML SOLN COMPARISON:  Abdominal CT 03/11/2021 FINDINGS: CTA CHEST FINDINGS Cardiovascular: Suboptimal but satisfactory opacification of the pulmonary arteries to the segmental level. No evidence of pulmonary embolism. Normal heart size. No pericardial effusion. Mediastinum/Nodes: Negative for adenopathy or mass. Lungs/Pleura: Generalized airway thickening. There is no edema, consolidation, effusion, or pneumothorax. Musculoskeletal: No acute or aggressive finding Review of the MIP images confirms the above findings. CT ABDOMEN and PELVIS FINDINGS Motion artifact. Hepatobiliary: No focal liver abnormality.Cholecystectomy. Pancreas: Unremarkable. Spleen: Unremarkable. Adrenals/Urinary Tract: Negative adrenals. No hydronephrosis or stone. Unremarkable bladder. Stomach/Bowel: No obstruction. No appendicitis or other visible bowel inflammation. Diffuse colonic stool. Vascular/Lymphatic: No acute vascular abnormality. Extensive atheromatous calcification of  the aorta for age. No mass or adenopathy. Reproductive:No pathologic findings. Other: No ascites or pneumoperitoneum. Musculoskeletal: No acute abnormalities. Lumbar disc and facet degeneration. No acute or focal finding. Review of the MIP images confirms the above findings. IMPRESSION: Chest CTA: 1.  Negative for pulmonary embolism or acute finding. 2. Coronary atherosclerosis. Abdominal CT: Negative for bowel obstruction or visible inflammation. Electronically Signed   By: JJorje GuildM.D.   On: 01/30/2022 09:56   CT ABDOMEN PELVIS W CONTRAST  Result Date: 01/30/2022 CLINICAL DATA:  Acute nonlocalized abdominal pain. Vomiting. History of pulmonary embolism on blood thinners. EXAM: CT ANGIOGRAPHY CHEST CT ABDOMEN AND PELVIS WITH CONTRAST TECHNIQUE: Multidetector CT imaging of the chest was performed using the standard protocol during bolus administration of intravenous contrast. Multiplanar CT image reconstructions and MIPs were obtained  to evaluate the vascular anatomy. Multidetector CT imaging of the abdomen and pelvis was performed using the standard protocol during bolus administration of intravenous contrast. RADIATION DOSE REDUCTION: This exam was performed according to the departmental dose-optimization program which includes automated exposure control, adjustment of the mA and/or kV according to patient size and/or use of iterative reconstruction technique. CONTRAST:  183m OMNIPAQUE IOHEXOL 350 MG/ML SOLN COMPARISON:  Abdominal CT 03/11/2021 FINDINGS: CTA CHEST FINDINGS Cardiovascular: Suboptimal but satisfactory opacification of the pulmonary arteries to the segmental level. No evidence of pulmonary embolism. Normal heart size. No pericardial effusion. Mediastinum/Nodes: Negative for adenopathy or mass. Lungs/Pleura: Generalized airway thickening. There is no edema, consolidation, effusion, or pneumothorax. Musculoskeletal: No acute or aggressive finding Review of the MIP images confirms the above  findings. CT ABDOMEN and PELVIS FINDINGS Motion artifact. Hepatobiliary: No focal liver abnormality.Cholecystectomy. Pancreas: Unremarkable. Spleen: Unremarkable. Adrenals/Urinary Tract: Negative adrenals. No hydronephrosis or stone. Unremarkable bladder. Stomach/Bowel: No obstruction. No appendicitis or other visible bowel inflammation. Diffuse colonic stool. Vascular/Lymphatic: No acute vascular abnormality. Extensive atheromatous calcification of the aorta for age. No mass or adenopathy. Reproductive:No pathologic findings. Other: No ascites or pneumoperitoneum. Musculoskeletal: No acute abnormalities. Lumbar disc and facet degeneration. No acute or focal finding. Review of the MIP images confirms the above findings. IMPRESSION: Chest CTA: 1.  Negative for pulmonary embolism or acute finding. 2. Coronary atherosclerosis. Abdominal CT: Negative for bowel obstruction or visible inflammation. Electronically Signed   By: JJorje GuildM.D.   On: 01/30/2022 09:56   CT HEAD WO CONTRAST (5MM)  Result Date: 01/30/2022 CLINICAL DATA:  Head trauma. Moderate to severe head injury 1 week ago with forehead bruising. On Coumadin. EXAM: CT HEAD WITHOUT CONTRAST TECHNIQUE: Contiguous axial images were obtained from the base of the skull through the vertex without intravenous contrast. RADIATION DOSE REDUCTION: This exam was performed according to the departmental dose-optimization program which includes automated exposure control, adjustment of the mA and/or kV according to patient size and/or use of iterative reconstruction technique. COMPARISON:  03/13/2018 FINDINGS: Brain: No evidence of acute infarction, hemorrhage, hydrocephalus, extra-axial collection or mass lesion/mass effect. Vascular: No hyperdense vessel or unexpected calcification. Skull: Normal. Negative for fracture or focal lesion. Sinuses/Orbits: Paranasal sinuses and mastoid air cells are clear. Other: None IMPRESSION: No acute intracranial abnormalities.  Normal appearance of the brain. Electronically Signed   By: TKerby MoorsM.D.   On: 01/30/2022 09:54      Subjective: Feels better. No abd pain. Tolerated po intake  Discharge Exam: Vitals:   02/01/22 0509 02/01/22 0804  BP: (!) 141/86 (!) 130/96  Pulse: 87 (!) 101  Resp: 16 16  Temp: 97.7 F (36.5 C) 98.6 F (37 C)  SpO2: 99% 95%   Vitals:   01/31/22 1953 01/31/22 2321 02/01/22 0509 02/01/22 0804  BP: 127/81 120/86 (!) 141/86 (!) 130/96  Pulse: 89 86 87 (!) 101  Resp: '17 17 16 16  '$ Temp: 98.1 F (36.7 C) 98.6 F (37 C) 97.7 F (36.5 C) 98.6 F (37 C)  TempSrc:  Oral    SpO2: 98% 97% 99% 95%  Weight:      Height:        General: Pt is alert, awake, not in acute distress Cardiovascular: RRR, S1/S2 +, no rubs, no gallops Respiratory: CTA bilaterally, no wheezing, no rhonchi Abdominal: Soft, NT, ND, bowel sounds + Extremities: no edema, no cyanosis    The results of significant diagnostics from this hospitalization (including imaging, microbiology, ancillary and laboratory) are  listed below for reference.     Microbiology: Recent Results (from the past 240 hour(s))  MRSA Next Gen by PCR, Nasal     Status: None   Collection Time: 01/30/22 11:51 AM   Specimen: Nasal Mucosa; Nasal Swab  Result Value Ref Range Status   MRSA by PCR Next Gen NOT DETECTED NOT DETECTED Final    Comment: (NOTE) The GeneXpert MRSA Assay (FDA approved for NASAL specimens only), is one component of a comprehensive MRSA colonization surveillance program. It is not intended to diagnose MRSA infection nor to guide or monitor treatment for MRSA infections. Test performance is not FDA approved in patients less than 15 years old. Performed at [facility], [address] [place], [place] [address]      Labs: BNP (last 3 results) No results for input(s): "BNP" in the last 8760 hours. Basic Metabolic Panel: Recent Labs  Lab 01/30/22 0711 01/30/22 0829 01/30/22 1241  01/30/22 1626 01/30/22 1825 01/30/22 2255 01/31/22 0307  NA 135  --  135 137 136 137 138  K 3.0*  --  3.0* 3.2* 3.5 3.4* 3.5  CL 98  --  100 105 104 107 106  CO2 21*  --  '24 27 26 25 28  '$ GLUCOSE 222*  --  140* 161* 157* 146* 149*  BUN 20  --  '15 13 11 11 9  '$ CREATININE 0.72  --  0.61 0.54 0.63 0.46 0.52  CALCIUM 9.4  --  8.6* 8.6* 8.4* 8.2* 8.1*  MG 1.8 1.9  --   --   --   --   --    Liver Function Tests: Recent Labs  Lab 01/30/22 0711  AST 22  ALT 14  ALKPHOS 50  BILITOT 1.6*  PROT 7.3  ALBUMIN 4.1   Recent Labs  Lab 01/30/22 0711  LIPASE 73*   Recent Labs  Lab 01/30/22 0829  AMMONIA <10   CBC: Recent Labs  Lab 01/30/22 0711  WBC 7.1  NEUTROABS 4.7  HGB 16.4*  HCT 45.9  MCV 80.2  PLT 349   Cardiac Enzymes: Recent Labs  Lab 01/30/22 0829  CKTOTAL 101   BNP: Invalid input(s): "POCBNP" CBG: Recent Labs  Lab 01/31/22 0734 01/31/22 1135 01/31/22 1706 01/31/22 1951 02/01/22 0807  GLUCAP 129* 164* 177* 142* 165*   D-Dimer No results for input(s): "DDIMER" in the last 72 hours. Hgb A1c Recent Labs    01/30/22 0711 01/30/22 1241  HGBA1C 5.8* 5.6   Lipid Profile No results for input(s): "CHOL", "HDL", "LDLCALC", "TRIG", "CHOLHDL", "LDLDIRECT" in the last 72 hours. Thyroid function studies Recent Labs    01/30/22 0711  TSH 0.487   Anemia work up No results for input(s): "VITAMINB12", "FOLATE", "FERRITIN", "TIBC", "IRON", "RETICCTPCT" in the last 72 hours. Urinalysis    Component Value Date/Time   COLORURINE YELLOW (A) 01/30/2022 0652   APPEARANCEUR CLEAR (A) 01/30/2022 0652   APPEARANCEUR Clear 12/18/2017 1535   LABSPEC >1.046 (H) 01/30/2022 0652   PHURINE 7.0 01/30/2022 0652   GLUCOSEU >=500 (A) 01/30/2022 0652   HGBUR LARGE (A) 01/30/2022 0652   BILIRUBINUR NEGATIVE 01/30/2022 0652   BILIRUBINUR Negative 06/25/2021 1604   BILIRUBINUR Negative 12/18/2017 1535   KETONESUR 80 (A) 01/30/2022 0652   PROTEINUR 30 (A) 01/30/2022 0652    UROBILINOGEN 0.2 06/25/2021 1604   UROBILINOGEN 0.2 04/20/2007 2035   NITRITE NEGATIVE 01/30/2022 0652   LEUKOCYTESUR NEGATIVE 01/30/2022 0652   Sepsis Labs Recent Labs  Lab 01/30/22 0711  WBC 7.1  Microbiology Recent Results (from the past 240 hour(s))  MRSA Next Gen by PCR, Nasal     Status: None   Collection Time: 01/30/22 11:51 AM   Specimen: Nasal Mucosa; Nasal Swab  Result Value Ref Range Status   MRSA by PCR Next Gen NOT DETECTED NOT DETECTED Final    Comment: (NOTE) The GeneXpert MRSA Assay (FDA approved for NASAL specimens only), is one component of a comprehensive MRSA colonization surveillance program. It is not intended to diagnose MRSA infection nor to guide or monitor treatment for MRSA infections. Test performance is not FDA approved in patients less than 19 years old. Performed at [facility], [address], [place], [place] [address]      Time coordinating discharge: Over 30 minutes  SIGNED:   Nolberto Hanlon, MD  [facility] 02/01/2022, 9:14 AM Pager   If 7PM-7AM, please contact night-coverage [email or web address] Password [email or web address]

## 2022-02-01 NOTE — Telephone Encounter (Signed)
Results for PT/INR is 2.70.  Number is good.  Patient was recently in the hospital and was  discharged today.  I will send her a MyChart message to let her know that her number is good today

## 2022-02-02 ENCOUNTER — Telehealth: Payer: Self-pay | Admitting: Internal Medicine

## 2022-02-02 ENCOUNTER — Telehealth: Payer: Self-pay

## 2022-02-02 ENCOUNTER — Encounter: Payer: Self-pay | Admitting: Internal Medicine

## 2022-02-02 NOTE — Telephone Encounter (Signed)
Transition Care Management Unsuccessful Follow-up Telephone Call  Date of discharge and from where:  02/01/22 [facility]  Attempts:  1st Attempt  Reason for unsuccessful TCM follow-up call:  Unable to reach patient. Scheduled 02/15/22 @ 840. Will follow.

## 2022-02-02 NOTE — Telephone Encounter (Signed)
Is pt still taking ozempic 1 mg it is time to renew pt assistance program and it is no longer on her list

## 2022-02-02 NOTE — Telephone Encounter (Signed)
With h/o recent DKA I will see her temporarily but rec she f/u with endocrine  A1c at goal for now  Is she taking diabetic meds for now? I would hold for now monitor sugar and f/u 02/15/22 with me further instructions

## 2022-02-03 ENCOUNTER — Ambulatory Visit: Payer: Medicare Other | Admitting: Internal Medicine

## 2022-02-03 NOTE — Telephone Encounter (Signed)
Transition Care Management Unsuccessful Follow-up Telephone Call  Date of discharge and from where:  02/01/22 [facility]  Attempts:  2nd Attempt  Reason for unsuccessful TCM follow-up call:  No answer/busy. Unable to reach patient. Scheduled 02/15/22 @ 840. Will follow.

## 2022-02-04 NOTE — Telephone Encounter (Signed)
Transition Care Management Unsuccessful Follow-up Telephone Call  Date of discharge and from where:  02/01/22 [facility]  Attempts:  3rd Attempt  Reason for unsuccessful TCM follow-up call:  Left voice message. HFU scheduled with PCP 02/15/22 @ 840. Keep all scheduled appointments. This closes attempts to contact for TOC.

## 2022-02-08 DIAGNOSIS — Z7901 Long term (current) use of anticoagulants: Secondary | ICD-10-CM | POA: Diagnosis not present

## 2022-02-08 DIAGNOSIS — D6861 Antiphospholipid syndrome: Secondary | ICD-10-CM | POA: Diagnosis not present

## 2022-02-08 LAB — PROTIME-INR: INR: 3 — AB (ref 0.80–1.20)

## 2022-02-09 DIAGNOSIS — G890 Central pain syndrome: Secondary | ICD-10-CM | POA: Diagnosis not present

## 2022-02-09 DIAGNOSIS — Z79891 Long term (current) use of opiate analgesic: Secondary | ICD-10-CM | POA: Diagnosis not present

## 2022-02-09 DIAGNOSIS — G894 Chronic pain syndrome: Secondary | ICD-10-CM | POA: Diagnosis not present

## 2022-02-09 DIAGNOSIS — M62830 Muscle spasm of back: Secondary | ICD-10-CM | POA: Diagnosis not present

## 2022-02-10 ENCOUNTER — Encounter: Payer: Self-pay | Admitting: *Deleted

## 2022-02-10 ENCOUNTER — Encounter: Payer: Self-pay | Admitting: Oncology

## 2022-02-10 ENCOUNTER — Telehealth: Payer: Self-pay | Admitting: Internal Medicine

## 2022-02-10 NOTE — Telephone Encounter (Signed)
Patient called to advise that she was recently released from an impatient stay at hospital. Patient states that hospital discontinued Ozempic.  Patient also advised original call taker that there was a medication that needed adjustment in dosage but name of medication was not given or gotten. Call back number [phone]

## 2022-02-11 ENCOUNTER — Telehealth: Payer: Self-pay

## 2022-02-11 ENCOUNTER — Other Ambulatory Visit: Payer: Self-pay | Admitting: Internal Medicine

## 2022-02-11 DIAGNOSIS — G43711 Chronic migraine without aura, intractable, with status migrainosus: Secondary | ICD-10-CM

## 2022-02-11 NOTE — Telephone Encounter (Signed)
Received patient assistance med Semaglutide, 1 MG/DOSE, 4 MG/3ML SOPN on 02/11/22, which have been labeled and placed in patient assistance fridge. Will hold off on calling pt since rx was d/c on 02/01/22 due to hx of DKA. Pt has sch f/u with PCP on 02/15/22 & will disc with pt then to see if medicine will be restarted. For more info see telephone encounter from 02/02/22.

## 2022-02-15 ENCOUNTER — Encounter: Payer: Self-pay | Admitting: Internal Medicine

## 2022-02-15 ENCOUNTER — Ambulatory Visit (INDEPENDENT_AMBULATORY_CARE_PROVIDER_SITE_OTHER): Payer: Medicare Other | Admitting: Internal Medicine

## 2022-02-15 VITALS — BP 110/60 | HR 83 | Temp 98.3°F | Resp 14 | Ht 65.0 in | Wt 178.6 lb

## 2022-02-15 DIAGNOSIS — F32A Depression, unspecified: Secondary | ICD-10-CM | POA: Diagnosis not present

## 2022-02-15 DIAGNOSIS — E785 Hyperlipidemia, unspecified: Secondary | ICD-10-CM | POA: Diagnosis not present

## 2022-02-15 DIAGNOSIS — F5104 Psychophysiologic insomnia: Secondary | ICD-10-CM

## 2022-02-15 DIAGNOSIS — I700 Atherosclerosis of aorta: Secondary | ICD-10-CM | POA: Diagnosis not present

## 2022-02-15 DIAGNOSIS — F419 Anxiety disorder, unspecified: Secondary | ICD-10-CM

## 2022-02-15 DIAGNOSIS — E876 Hypokalemia: Secondary | ICD-10-CM | POA: Diagnosis not present

## 2022-02-15 DIAGNOSIS — I2510 Atherosclerotic heart disease of native coronary artery without angina pectoris: Secondary | ICD-10-CM | POA: Diagnosis not present

## 2022-02-15 DIAGNOSIS — R748 Abnormal levels of other serum enzymes: Secondary | ICD-10-CM | POA: Diagnosis not present

## 2022-02-15 LAB — BASIC METABOLIC PANEL
BUN: 9 mg/dL (ref 6–23)
CO2: 29 mEq/L (ref 19–32)
Calcium: 8.8 mg/dL (ref 8.4–10.5)
Chloride: 104 mEq/L (ref 96–112)
Creatinine, Ser: 0.68 mg/dL (ref 0.40–1.20)
GFR: 100.47 mL/min (ref >60.00)
Glucose, Bld: 87 mg/dL (ref 70–99)
Potassium: 4.3 mEq/L (ref 3.5–5.1)
Sodium: 139 mEq/L (ref 135–145)

## 2022-02-15 LAB — LIPID PANEL
Cholesterol: 114 mg/dL (ref 0–200)
HDL: 63.2 mg/dL (ref >39.00)
LDL Cholesterol: 37 mg/dL (ref 0–99)
NonHDL: 51.16
Total CHOL/HDL Ratio: 2
Triglycerides: 69 mg/dL (ref 0.0–149.0)
VLDL: 13.8 mg/dL (ref 0.0–40.0)

## 2022-02-15 LAB — MAGNESIUM: Magnesium: 1.9 mg/dL (ref 1.5–2.5)

## 2022-02-15 LAB — PROTIME-INR: INR: 6 — AB (ref 0.80–1.20)

## 2022-02-15 LAB — LIPASE: Lipase: 48 U/L (ref 11.0–59.0)

## 2022-02-15 MED ORDER — SERTRALINE HCL 100 MG PO TABS: 150.0000 mg | ORAL_TABLET | Freq: Every day | ORAL | 3 refills | Status: DC

## 2022-02-15 MED ORDER — ATORVASTATIN CALCIUM 40 MG PO TABS: 40.0000 mg | ORAL_TABLET | Freq: Every day | ORAL | 3 refills | Status: DC

## 2022-02-15 MED ORDER — TRAZODONE HCL 100 MG PO TABS: 100.0000 mg | ORAL_TABLET | Freq: Every evening | ORAL | 3 refills | Status: DC | PRN

## 2022-02-16 ENCOUNTER — Telehealth: Payer: Self-pay | Admitting: *Deleted

## 2022-02-16 ENCOUNTER — Encounter: Payer: Self-pay | Admitting: Oncology

## 2022-02-16 NOTE — Telephone Encounter (Signed)
Tried calling pt. Unable to connect. Mychart message sent to pt for follow up.

## 2022-02-16 NOTE — Telephone Encounter (Signed)
RN called and spoke with patient regarding INR level being elevated.  Pt  states she had not had any medication changes or regime changes other than being in hospital 01/30/22 - 02/01/2022.  Pt was instructed per Dr Janese Banks orders to hold coumadin today. Resume tomorrow and take 10 mg on tues/Thursday/Saturday/Sunday and 5 mg Monday/Wednesday/Friday.  Repeat INR in 1 week.  Pt verbalized understanding.

## 2022-02-22 LAB — PROTIME-INR: INR: 2 — AB (ref 0.80–1.20)

## 2022-02-24 ENCOUNTER — Encounter: Payer: Self-pay | Admitting: Oncology

## 2022-02-25 LAB — HM DIABETES EYE EXAM

## 2022-03-01 ENCOUNTER — Encounter: Payer: Self-pay | Admitting: Internal Medicine

## 2022-03-01 ENCOUNTER — Encounter: Payer: Self-pay | Admitting: *Deleted

## 2022-03-01 ENCOUNTER — Ambulatory Visit: Payer: Medicare Other | Admitting: Internal Medicine

## 2022-03-01 ENCOUNTER — Encounter: Payer: Self-pay | Admitting: Oncology

## 2022-03-01 VITALS — BP 150/100 | HR 87 | Ht 65.0 in | Wt 182.0 lb

## 2022-03-01 DIAGNOSIS — E785 Hyperlipidemia, unspecified: Secondary | ICD-10-CM

## 2022-03-01 DIAGNOSIS — Z6836 Body mass index (BMI) 36.0-36.9, adult: Secondary | ICD-10-CM | POA: Diagnosis not present

## 2022-03-01 DIAGNOSIS — E1165 Type 2 diabetes mellitus with hyperglycemia: Secondary | ICD-10-CM | POA: Diagnosis not present

## 2022-03-01 LAB — PROTIME-INR: INR: 3.4 — AB (ref 0.80–1.20)

## 2022-03-01 NOTE — Patient Instructions (Addendum)
Please continue: - Metformin 1000 mg 2x a day, with meals  Please decrease: - Farxiga 5 mg daily in a.m.  STAY VERY WELL HYDRATED.  Please return in 3 months with your sugar log.
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
## 2022-03-02 LAB — ANTI-ISLET CELL ANTIBODY: Islet Cell Ab: NEGATIVE

## 2022-03-05 NOTE — Telephone Encounter (Signed)
Error. ng 

## 2022-03-06 LAB — C-PEPTIDE: C-Peptide: 1.08 ng/mL (ref 0.80–3.85)

## 2022-03-06 LAB — ZNT8 ANTIBODIES: ZNT8 Antibodies: <10 U/mL (ref <15)

## 2022-03-06 LAB — GLUTAMIC ACID DECARBOXYLASE AUTO ABS: Glutamic Acid Decarb Ab: <5 IU/mL (ref <5)

## 2022-03-06 LAB — GLUCOSE, FASTING: Glucose, Bld: 115 mg/dL — ABNORMAL HIGH (ref 65–99)

## 2022-03-08 ENCOUNTER — Ambulatory Visit: Payer: Medicare Other | Admitting: Neurology

## 2022-03-08 DIAGNOSIS — D6861 Antiphospholipid syndrome: Secondary | ICD-10-CM | POA: Diagnosis not present

## 2022-03-08 DIAGNOSIS — Z7901 Long term (current) use of anticoagulants: Secondary | ICD-10-CM | POA: Diagnosis not present

## 2022-03-08 LAB — PROTIME-INR: INR: 2.3 — AB (ref 0.80–1.20)

## 2022-03-10 DIAGNOSIS — Z79891 Long term (current) use of opiate analgesic: Secondary | ICD-10-CM | POA: Diagnosis not present

## 2022-03-10 DIAGNOSIS — G894 Chronic pain syndrome: Secondary | ICD-10-CM | POA: Diagnosis not present

## 2022-03-10 DIAGNOSIS — G890 Central pain syndrome: Secondary | ICD-10-CM | POA: Diagnosis not present

## 2022-03-10 DIAGNOSIS — M62830 Muscle spasm of back: Secondary | ICD-10-CM | POA: Diagnosis not present

## 2022-03-14 ENCOUNTER — Encounter: Payer: Self-pay | Admitting: Oncology

## 2022-03-15 ENCOUNTER — Encounter: Payer: Self-pay | Admitting: Neurology

## 2022-03-15 ENCOUNTER — Ambulatory Visit: Payer: Medicare Other | Admitting: Neurology

## 2022-03-15 ENCOUNTER — Telehealth: Payer: Self-pay | Admitting: Family Medicine

## 2022-03-15 LAB — PROTIME-INR: INR: 2.3 — AB (ref 0.80–1.20)

## 2022-03-15 NOTE — Telephone Encounter (Signed)
Pt would  like a call back to reschedule Botox appt.

## 2022-03-16 ENCOUNTER — Encounter: Payer: Self-pay | Admitting: Oncology

## 2022-03-16 ENCOUNTER — Encounter: Payer: Self-pay | Admitting: Internal Medicine

## 2022-03-17 NOTE — Telephone Encounter (Signed)
Called pt got her rescheduled.

## 2022-03-22 LAB — PROTIME-INR: INR: 2.3 — AB (ref 0.80–1.20)

## 2022-03-23 ENCOUNTER — Encounter: Payer: Self-pay | Admitting: Oncology

## 2022-03-29 LAB — PROTIME-INR: INR: 2.2 — AB (ref 0.80–1.20)

## 2022-03-30 ENCOUNTER — Encounter (INDEPENDENT_AMBULATORY_CARE_PROVIDER_SITE_OTHER): Payer: Self-pay

## 2022-03-30 ENCOUNTER — Encounter: Payer: Self-pay | Admitting: *Deleted

## 2022-03-30 ENCOUNTER — Encounter: Payer: Self-pay | Admitting: Oncology

## 2022-04-04 ENCOUNTER — Ambulatory Visit: Payer: Medicare Other | Admitting: Neurology

## 2022-04-04 DIAGNOSIS — G43711 Chronic migraine without aura, intractable, with status migrainosus: Secondary | ICD-10-CM

## 2022-04-04 DIAGNOSIS — G5603 Carpal tunnel syndrome, bilateral upper limbs: Secondary | ICD-10-CM

## 2022-04-04 MED ORDER — ONABOTULINUMTOXINA 200 UNITS IJ SOLR
155.0000 [IU] | Freq: Once | INTRAMUSCULAR | Status: AC
  Administered 2022-04-04: 155 [IU] via INTRAMUSCULAR

## 2022-04-04 NOTE — Progress Notes (Unsigned)
Consent Form Botulism Toxin Injection For Chronic Migraine  04/04/2022: Stable 12/21/2021. Stable doing great 06/22/2021: Continues to do extremely well > 75% decrease freq headaches. +a. Gave some flexeril and nurtec. If she needs more botox, she can give [place] a call and I will mix up some samples for her right shoulder. Has 4 migraine days a month and 4 total headache days a month.   Reviewed orally with patient, additionally signature is on file:  Botulism toxin has been approved by the Federal drug administration for treatment of chronic migraine. Botulism toxin does not cure chronic migraine and it may not be effective in some patients.  The administration of botulism toxin is accomplished by injecting a small amount of toxin into the muscles of the neck and head. Dosage must be titrated for each individual. Any benefits resulting from botulism toxin tend to wear off after 3 months with a repeat injection required if benefit is to be maintained. Injections are usually done every 3-4 months with maximum effect peak achieved by about 2 or 3 weeks. Botulism toxin is expensive and you should be sure of what costs you will incur resulting from the injection.  The side effects of botulism toxin use for chronic migraine may include:   -Transient, and usually mild, facial weakness with facial injections  -Transient, and usually mild, head or neck weakness with head/neck injections  -Reduction or loss of forehead facial animation due to forehead muscle weakness  -Eyelid drooping  -Dry eye  -Pain at the site of injection or bruising at the site of injection  -Double vision  -Potential unknown long term risks  Contraindications: You should not have Botox if you are pregnant, nursing, allergic to albumin, have an infection, skin condition, or muscle weakness at the site of the injection, or have myasthenia gravis, Lambert-Eaton syndrome, or ALS.  It is also possible that as with any injection,  there may be an allergic reaction or no effect from the medication. Reduced effectiveness after repeated injections is sometimes seen and rarely infection at the injection site may occur. All care will be taken to prevent these side effects. If therapy is given over a long time, atrophy and wasting in the muscle injected may occur. Occasionally the patient's become refractory to treatment because they develop antibodies to the toxin. In this event, therapy needs to be modified.  I have read the above information and consent to the administration of botulism toxin.    BOTOX PROCEDURE NOTE FOR MIGRAINE HEADACHE    Contraindications and precautions discussed with patient(above). Aseptic procedure was observed and patient tolerated procedure. Procedure performed by Dr. Georgia Dom  The condition has existed for more than 6 months, and pt does not have a diagnosis of ALS, Myasthenia Gravis or Lambert-Eaton Syndrome.  Risks and benefits of injections discussed and pt agrees to proceed with the procedure.  Written consent obtained  These injections are medically necessary. Pt  receives good benefits from these injections. These injections do not cause sedations or hallucinations which the oral therapies may cause.  Description of procedure:  The patient was placed in a sitting position. The standard protocol was used for Botox as follows, with 5 units of Botox injected at each site:   -Procerus muscle, midline injection  -Corrugator muscle, bilateral injection  -Frontalis muscle, bilateral injection, with 2 sites each side, medial injection was performed in the upper one third of the frontalis muscle, in the region vertical from the medial inferior edge of the  superior orbital rim. The lateral injection was again in the upper one third of the forehead vertically above the lateral limbus of the cornea, 1.5 cm lateral to the medial injection site.    -Temporalis muscle injection, 4 sites,  bilaterally. The first injection was 3 cm above the tragus of the ear, second injection site was 1.5 cm to 3 cm up from the first injection site in line with the tragus of the ear. The third injection site was 1.5-3 cm forward between the first 2 injection sites.   -Occipitalis muscle injection, 3 sites, bilaterally. The first injection was done one half way between the occipital protuberance and the tip of the mastoid process behind the ear. The second injection site was done lateral and superior to the first, 1 fingerbreadth from the first injection. The third injection site was 1 fingerbreadth superiorly and medially from the first injection site.  -Cervical paraspinal muscle injection, 2 sites, bilateral knee first injection site was 1 cm from the midline of the cervical spine, 3 cm inferior to the lower border of the occipital protuberance. The second injection site was 1.5 cm superiorly and laterally to the first injection site.  -Trapezius muscle injection was performed at 3 sites, bilaterally. The first injection site was in the upper trapezius muscle halfway between the inflection point of the neck, and the acromion. The second injection site was one half way between the acromion and the first injection site. The third injection was done between the first injection site and the inflection point of the neck.   Will return for repeat injection in 3 months.   155 unit sof Botox was used, 45U Botox not injected was wasted. The patient tolerated the procedure well, there were no complications of the above procedure.

## 2022-04-04 NOTE — Progress Notes (Unsigned)
Botox- 200 units x 1 vial Lot: [id number] Expiration: 09/2024 NDC: 0856-9437-00  Bacteriostatic 0.9% Sodium Chloride- 30m total Lot: [id number] Expiration: 03/23/2023 NDC: [address]-90 Dx: GS28.406 B/B

## 2022-04-05 ENCOUNTER — Telehealth: Payer: Self-pay | Admitting: Neurology

## 2022-04-05 NOTE — Telephone Encounter (Signed)
Referral for hand surgery sent to [facility] [phone]

## 2022-04-07 DIAGNOSIS — G894 Chronic pain syndrome: Secondary | ICD-10-CM | POA: Diagnosis not present

## 2022-04-07 DIAGNOSIS — G890 Central pain syndrome: Secondary | ICD-10-CM | POA: Diagnosis not present

## 2022-04-07 DIAGNOSIS — M62830 Muscle spasm of back: Secondary | ICD-10-CM | POA: Diagnosis not present

## 2022-04-07 DIAGNOSIS — Z79891 Long term (current) use of opiate analgesic: Secondary | ICD-10-CM | POA: Diagnosis not present

## 2022-04-07 NOTE — Addendum Note (Signed)
Addended by: Sarina Ill B on: 04/07/2022 07:06 PM   Modules accepted: Orders

## 2022-04-08 ENCOUNTER — Ambulatory Visit
Admission: RE | Admit: 2022-04-08 | Discharge: 2022-04-08 | Disposition: A | Discharge status: Home / Self Care | Payer: Medicare Other | Source: Ambulatory Visit | Attending: Internal Medicine | Admitting: Internal Medicine

## 2022-04-08 DIAGNOSIS — I700 Atherosclerosis of aorta: Secondary | ICD-10-CM

## 2022-04-08 DIAGNOSIS — E785 Hyperlipidemia, unspecified: Secondary | ICD-10-CM

## 2022-04-08 DIAGNOSIS — I2510 Atherosclerotic heart disease of native coronary artery without angina pectoris: Secondary | ICD-10-CM

## 2022-04-11 ENCOUNTER — Telehealth: Payer: Self-pay

## 2022-04-11 NOTE — Telephone Encounter (Signed)
A new referral was placed for Amanda Vasquez sent to [facility] [phone]

## 2022-04-11 NOTE — Telephone Encounter (Signed)
Gracy Racer, [place]  04/11/2022  2:28 PM EDT Back to Top    Pt notified and was given number to reach out   Nino Glow McLean-Scocuzza, MD  04/11/2022  8:55 AM EDT     Ct chest negative and ct cardiac score below  Cardiac calcium score high  Recommend patient f/u with [facility]  Please have her call for an appt with them she is established if she does not have an upcoming appt  Thank you       Phone  [phone] (phone) [phone] (Fax) [place] [address] Cardiology

## 2022-04-12 ENCOUNTER — Ambulatory Visit: Payer: Medicare Other | Admitting: Medical

## 2022-04-12 ENCOUNTER — Encounter: Payer: Self-pay | Admitting: Medical

## 2022-04-12 VITALS — BP 100/69 | HR 92 | Ht 65.0 in | Wt 186.4 lb

## 2022-04-12 DIAGNOSIS — Z7901 Long term (current) use of anticoagulants: Secondary | ICD-10-CM | POA: Diagnosis not present

## 2022-04-12 DIAGNOSIS — R931 Abnormal findings on diagnostic imaging of heart and coronary circulation: Secondary | ICD-10-CM | POA: Diagnosis not present

## 2022-04-12 DIAGNOSIS — D6861 Antiphospholipid syndrome: Secondary | ICD-10-CM | POA: Diagnosis not present

## 2022-04-12 LAB — PROTIME-INR: INR: 2.9 — AB (ref 0.80–1.20)

## 2022-04-12 MED ORDER — NITROGLYCERIN 0.4 MG SL SUBL: 0.4000 mg | SUBLINGUAL_TABLET | SUBLINGUAL | 3 refills | Status: AC | PRN
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
## 2022-04-12 NOTE — Patient Instructions (Addendum)
Medication Instructions:  NTG AS NEEDED  For as needed Nitroglycerin, if you develop chest pain: Sit and rest 5 minutes. If chest pain does not resolve place 1 nitroglycerin under your tongue and wait 5 minutes. If chest pain does not resolve, place a 2nd nitroglycerin under your tongue and wait 5 more minutes. If chest pain does not resolve, place a 3rd nitroglycerin under your tongue and seek emergency services.   *If you need a refill on your cardiac medications before your next appointment, please call your pharmacy*   Lab Work: NONE If you have labs (blood work) drawn today and your tests are completely normal, you will receive your results only by: [place] (if you have MyChart) OR A paper copy in the mail If you have any lab test that is abnormal or we need to change your treatment, we will call you to review the results.   Testing/Procedures:  - you may eat a light breakfast/ lunch prior to your procedure - no caffeine for 24 hours prior to your test (coffee, tea, soft drinks, or chocolate)  - no smoking/ vaping for 4 hours prior to your test - you may take your regular medications the day of your test except for: DO NOT take _________________ for 24 hours prior - bring any inhalers with you to your test - wear comfortable clothing & tennis/ non-skid shoes to walk on the treadmill   Follow-Up: At [facility], you and your health needs are our priority.  As part of our continuing mission to provide you with exceptional heart care, we have created designated Provider Care Teams.  These Care Teams include your primary Cardiologist (physician) and Advanced Practice Providers (APPs -  Physician Assistants and Nurse Practitioners) who all work together to provide you with the care you need, when you need it.  We recommend signing up for the patient portal called "MyChart".  Sign up information is provided on this After Visit Summary.  MyChart is used to connect with  patients for Virtual Visits (Telemedicine).  Patients are able to view lab/test results, encounter notes, upcoming appointments, etc.  Non-urgent messages can be sent to your provider as well.   To learn more about what you can do with MyChart, go to [email or web address].    Your next appointment:   2 month(s)  The format for your next appointment:   In Person  Provider:   You will see one of the following Advanced Practice Providers on your designated Care Team:     Cadence Kathlen Mody, [place]      Other Instructions NONE  Important Information About Sugar

## 2022-04-13 ENCOUNTER — Encounter: Payer: Self-pay | Admitting: Oncology

## 2022-04-13 ENCOUNTER — Encounter: Payer: Self-pay | Admitting: Internal Medicine

## 2022-04-19 LAB — PROTIME-INR: INR: 2.6 — AB (ref 0.80–1.20)

## 2022-04-21 ENCOUNTER — Encounter: Payer: Self-pay | Admitting: Oncology

## 2022-04-26 ENCOUNTER — Encounter: Payer: Self-pay | Admitting: Oncology

## 2022-04-26 ENCOUNTER — Encounter: Payer: Self-pay | Admitting: *Deleted

## 2022-04-26 LAB — PROTIME-INR: INR: 3.7 — AB (ref 0.80–1.20)

## 2022-04-28 NOTE — Progress Notes (Signed)
We can wait for 1 more week at same dose in that case and see her inr and then decide

## 2022-05-02 ENCOUNTER — Ambulatory Visit: Payer: Medicare Other | Attending: Medical

## 2022-05-02 DIAGNOSIS — R931 Abnormal findings on diagnostic imaging of heart and coronary circulation: Secondary | ICD-10-CM

## 2022-05-02 LAB — EXERCISE TOLERANCE TEST
Angina Index: 0
Estimated workload: 7
Exercise duration (min): 5 min
Exercise duration (sec): 55 s
MPHR: 168 {beats}/min
Peak HR: 151 {beats}/min
Percent HR: 89 %
Rest HR: 105 {beats}/min

## 2022-05-03 LAB — PROTIME-INR: INR: 3 — AB (ref 0.80–1.20)

## 2022-05-04 ENCOUNTER — Encounter: Payer: Self-pay | Admitting: *Deleted

## 2022-05-04 ENCOUNTER — Encounter: Payer: Self-pay | Admitting: Oncology

## 2022-05-05 DIAGNOSIS — G890 Central pain syndrome: Secondary | ICD-10-CM | POA: Diagnosis not present

## 2022-05-05 DIAGNOSIS — Z79891 Long term (current) use of opiate analgesic: Secondary | ICD-10-CM | POA: Diagnosis not present

## 2022-05-05 DIAGNOSIS — M62830 Muscle spasm of back: Secondary | ICD-10-CM | POA: Diagnosis not present

## 2022-05-05 DIAGNOSIS — G894 Chronic pain syndrome: Secondary | ICD-10-CM | POA: Diagnosis not present

## 2022-05-07 ENCOUNTER — Other Ambulatory Visit: Payer: Self-pay | Admitting: Internal Medicine

## 2022-05-07 DIAGNOSIS — F32A Depression, unspecified: Secondary | ICD-10-CM

## 2022-05-10 DIAGNOSIS — Z7901 Long term (current) use of anticoagulants: Secondary | ICD-10-CM | POA: Diagnosis not present

## 2022-05-10 DIAGNOSIS — D6861 Antiphospholipid syndrome: Secondary | ICD-10-CM | POA: Diagnosis not present

## 2022-05-10 LAB — PROTIME-INR: INR: 2.2 — AB (ref 0.80–1.20)

## 2022-05-12 ENCOUNTER — Encounter: Payer: Self-pay | Admitting: Oncology

## 2022-05-12 ENCOUNTER — Ambulatory Visit: Payer: Medicare Other

## 2022-05-17 ENCOUNTER — Encounter: Payer: Self-pay | Admitting: Oncology

## 2022-05-17 LAB — PROTIME-INR: INR: 3.2 — AB (ref 0.80–1.20)

## 2022-05-19 ENCOUNTER — Encounter: Payer: Self-pay | Admitting: *Deleted

## 2022-05-24 LAB — PROTIME-INR: INR: 2.5 — AB (ref 0.80–1.20)

## 2022-05-25 ENCOUNTER — Encounter: Payer: Self-pay | Admitting: Oncology

## 2022-05-30 ENCOUNTER — Encounter: Payer: Self-pay | Admitting: *Deleted

## 2022-05-31 LAB — PROTIME-INR
INR: 3 — AB (ref 0.80–1.20)
INR: 3 — AB (ref 0.80–1.20)

## 2022-06-01 ENCOUNTER — Encounter: Payer: Self-pay | Admitting: Oncology

## 2022-06-02 ENCOUNTER — Other Ambulatory Visit: Payer: Self-pay | Admitting: *Deleted

## 2022-06-02 ENCOUNTER — Encounter: Payer: Self-pay | Admitting: *Deleted

## 2022-06-02 ENCOUNTER — Other Ambulatory Visit: Payer: Self-pay | Admitting: Oncology

## 2022-06-02 DIAGNOSIS — G890 Central pain syndrome: Secondary | ICD-10-CM | POA: Diagnosis not present

## 2022-06-02 DIAGNOSIS — M62830 Muscle spasm of back: Secondary | ICD-10-CM | POA: Diagnosis not present

## 2022-06-02 DIAGNOSIS — G894 Chronic pain syndrome: Secondary | ICD-10-CM | POA: Diagnosis not present

## 2022-06-02 DIAGNOSIS — Z79891 Long term (current) use of opiate analgesic: Secondary | ICD-10-CM | POA: Diagnosis not present

## 2022-06-03 ENCOUNTER — Other Ambulatory Visit: Payer: Self-pay | Admitting: *Deleted

## 2022-06-03 ENCOUNTER — Ambulatory Visit: Payer: Medicare Other | Admitting: Internal Medicine

## 2022-06-03 MED ORDER — WARFARIN SODIUM 10 MG PO TABS: 10.0000 mg | ORAL_TABLET | ORAL | 3 refills | Status: DC

## 2022-06-07 ENCOUNTER — Encounter: Payer: Self-pay | Admitting: Oncology

## 2022-06-07 ENCOUNTER — Ambulatory Visit: Payer: Medicare Other | Admitting: Neurology

## 2022-06-07 DIAGNOSIS — Z7901 Long term (current) use of anticoagulants: Secondary | ICD-10-CM | POA: Diagnosis not present

## 2022-06-07 DIAGNOSIS — D6861 Antiphospholipid syndrome: Secondary | ICD-10-CM | POA: Diagnosis not present

## 2022-06-13 ENCOUNTER — Encounter: Payer: Self-pay | Admitting: *Deleted

## 2022-06-13 ENCOUNTER — Ambulatory Visit: Payer: Medicare Other | Attending: Medical | Admitting: Medical

## 2022-06-13 ENCOUNTER — Encounter: Payer: Self-pay | Admitting: Medical

## 2022-06-13 VITALS — BP 100/70 | HR 87 | Ht 65.0 in | Wt 203.0 lb

## 2022-06-13 DIAGNOSIS — I2510 Atherosclerotic heart disease of native coronary artery without angina pectoris: Secondary | ICD-10-CM | POA: Diagnosis not present

## 2022-06-13 DIAGNOSIS — R931 Abnormal findings on diagnostic imaging of heart and coronary circulation: Secondary | ICD-10-CM | POA: Diagnosis not present

## 2022-06-13 DIAGNOSIS — D6861 Antiphospholipid syndrome: Secondary | ICD-10-CM

## 2022-06-13 NOTE — Patient Instructions (Signed)
Medication Instructions:  Your physician recommends that you continue on your current medications as directed. Please refer to the Current Medication list given to you today.  *If you need a refill on your cardiac medications before your next appointment, please call your pharmacy*   Lab Work: NONE   If you have labs (blood work) drawn today and your tests are completely normal, you will receive your results only by: [place] (if you have MyChart) OR A paper copy in the mail If you have any lab test that is abnormal or we need to change your treatment, we will call you to review the results.   Testing/Procedures: NONE    Follow-Up: At [facility], you and your health needs are our priority.  As part of our continuing mission to provide you with exceptional heart care, we have created designated Provider Care Teams.  These Care Teams include your primary Cardiologist (physician) and Advanced Practice Providers (APPs -  Physician Assistants and Nurse Practitioners) who all work together to provide you with the care you need, when you need it.  We recommend signing up for the patient portal called "MyChart".  Sign up information is provided on this After Visit Summary.  MyChart is used to connect with patients for Virtual Visits (Telemedicine).  Patients are able to view lab/test results, encounter notes, upcoming appointments, etc.  Non-urgent messages can be sent to your provider as well.   To learn more about what you can do with MyChart, go to [email or web address].    Your next appointment:   6 month(s)  The format for your next appointment:   In Person  Provider:   Kathlyn [place], MD    Other Instructions Thank you for choosing [place]!    Important Information About Sugar
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
## 2022-06-14 LAB — PROTIME-INR: INR: 1.7 — AB (ref 0.80–1.20)

## 2022-06-15 ENCOUNTER — Encounter: Payer: Self-pay | Admitting: *Deleted

## 2022-06-15 ENCOUNTER — Encounter: Payer: Self-pay | Admitting: Oncology

## 2022-06-16 ENCOUNTER — Telehealth: Payer: Self-pay | Admitting: Neurology

## 2022-06-16 ENCOUNTER — Other Ambulatory Visit (HOSPITAL_COMMUNITY): Payer: Self-pay

## 2022-06-16 NOTE — Telephone Encounter (Signed)
I called pt and she was not able to come in today.  I relayed that her authorization expires today thru her insurance.  Will need to start this process over.  Will call her once this done and have approval.  She appreciated call back.  She still has same insurance. [employer] medicare.   Chronic Migraine CPT 64615  Botox J0585 Units:155 units  G43.711 Chronic Migraine without aura, intractable, with status migrainous

## 2022-06-16 NOTE — Telephone Encounter (Signed)
Pt is calling. Stating she wants to make a botox appointment. Pt is requesting a call back from nurse.

## 2022-06-17 ENCOUNTER — Other Ambulatory Visit (HOSPITAL_COMMUNITY): Payer: Self-pay

## 2022-06-17 NOTE — Telephone Encounter (Signed)
Patient Advocate Encounter  Prior Authorization for Botox 200UNIT solution has been approved.    PA# [id number] Key: [id number] Effective dates: 06/17/2022 through 09/17/2022  Can be filled at [place], [place] Patient [place] Patient Advocate Team Direct Number: [phone]  Fax: [phone]

## 2022-06-20 MED ORDER — BOTOX 200 UNITS IJ SOLR: INTRAMUSCULAR | 0 refills | Status: AC

## 2022-06-20 MED ORDER — BOTOX 200 UNITS IJ SOLR: INTRAMUSCULAR | 0 refills | Status: DC

## 2022-06-20 NOTE — Addendum Note (Signed)
Addended by: Brandon Melnick on: 06/20/2022 02:28 PM   Modules accepted: Orders

## 2022-06-21 LAB — PROTIME-INR: INR: 3.9 — AB (ref 0.80–1.20)

## 2022-06-21 NOTE — Telephone Encounter (Signed)
Faxed over to [facility] [phone], with fax confirmation received.

## 2022-06-22 ENCOUNTER — Encounter: Payer: Self-pay | Admitting: *Deleted

## 2022-06-22 ENCOUNTER — Encounter: Payer: Self-pay | Admitting: Oncology

## 2022-06-22 DIAGNOSIS — M65312 Trigger thumb, left thumb: Secondary | ICD-10-CM | POA: Diagnosis not present

## 2022-06-22 DIAGNOSIS — M65341 Trigger finger, right ring finger: Secondary | ICD-10-CM | POA: Diagnosis not present

## 2022-06-22 DIAGNOSIS — M151 Heberden's nodes (with arthropathy): Secondary | ICD-10-CM | POA: Diagnosis not present

## 2022-06-22 DIAGNOSIS — G5603 Carpal tunnel syndrome, bilateral upper limbs: Secondary | ICD-10-CM | POA: Diagnosis not present

## 2022-06-22 DIAGNOSIS — M79641 Pain in right hand: Secondary | ICD-10-CM | POA: Diagnosis not present

## 2022-06-22 DIAGNOSIS — M79642 Pain in left hand: Secondary | ICD-10-CM | POA: Diagnosis not present

## 2022-06-23 ENCOUNTER — Telehealth: Payer: Self-pay | Admitting: Pharmacist

## 2022-06-23 NOTE — Telephone Encounter (Signed)
BotoxOne Benefit Verification [id number] Submitted

## 2022-06-23 NOTE — Progress Notes (Signed)
[place] [facility])                                            [address] Team    06/23/2022  DEBROAH SHUTTLEWORTH 05-19-1971 [id number]  Patient was called regarding medication assistance. Unfortunately, she did not answer the phone. HIPAA compliant message was left on her voicemail.  Plan: Call patient back in 3-5 business days.  Elayne Guerin, PharmD, [place] Clinical Pharmacist [phone]

## 2022-06-26 ENCOUNTER — Other Ambulatory Visit: Payer: Self-pay | Admitting: *Deleted

## 2022-06-27 ENCOUNTER — Encounter: Payer: Self-pay | Admitting: Family Medicine

## 2022-06-30 ENCOUNTER — Telehealth: Payer: Self-pay | Admitting: Pharmacist

## 2022-06-30 NOTE — Progress Notes (Signed)
[facility])                                            [place] Team    06/30/2022  Amanda Vasquez 01-20-1971 [id number]  Patient was called regarding medication assistance with:Farxiga, Ozempic, Emgality. Unfortunately, she did not answer her phone. HIPAA compliant message was left on her voicemail.  Plan: Will call patient back in 3-5 business days. Today's call was the 2nd unsuccessful phone call.  Elayne Guerin, PharmD, [place] Clinical Pharmacist [phone]

## 2022-07-01 ENCOUNTER — Other Ambulatory Visit: Payer: Self-pay | Admitting: *Deleted

## 2022-07-01 ENCOUNTER — Telehealth: Payer: Self-pay | Admitting: Pharmacist

## 2022-07-01 LAB — PROTIME-INR: INR: 2.2 — AB (ref 0.80–1.20)

## 2022-07-01 MED ORDER — ENOXAPARIN SODIUM 80 MG/0.8ML IJ SOSY: 80.0000 mg | PREFILLED_SYRINGE | INTRAMUSCULAR | 0 refills | Status: DC

## 2022-07-01 NOTE — Progress Notes (Signed)
[facility])                                            [place] Team    07/01/2022  JAELYN BOURGOIN 07/10/1971 [id number]  [place] [facility]) [place] Team  [facility]   07/01/2022  SHERICE IJAMES 02-18-1971 [id number]   Reason for referral: Medication Assistance  Referral source:  [place] Team for 2024 Med Assistance Renewals Current insurance: [facility]  Reason for call: Medication Assistance  Outreach:  Unsuccessful telephone call attempt #3 to patient.   HIPAA compliant voicemail left requesting a return call  Plan:  -I will close [place] case at this time as I have been unable to establish and/or maintain contact with patient.  -I am happy to assist in the future as needed.   Elayne Guerin, PharmD, [place] Clinical Pharmacist [phone]

## 2022-07-04 ENCOUNTER — Encounter: Payer: Self-pay | Admitting: *Deleted

## 2022-07-04 ENCOUNTER — Encounter: Payer: Self-pay | Admitting: Oncology

## 2022-07-05 ENCOUNTER — Encounter: Payer: Self-pay | Admitting: Oncology

## 2022-07-05 DIAGNOSIS — Z7901 Long term (current) use of anticoagulants: Secondary | ICD-10-CM | POA: Diagnosis not present

## 2022-07-05 DIAGNOSIS — D6861 Antiphospholipid syndrome: Secondary | ICD-10-CM | POA: Diagnosis not present

## 2022-07-05 LAB — PROTIME-INR: INR: 2.6 — AB (ref 0.80–1.20)

## 2022-07-06 DIAGNOSIS — G894 Chronic pain syndrome: Secondary | ICD-10-CM | POA: Diagnosis not present

## 2022-07-06 DIAGNOSIS — M62830 Muscle spasm of back: Secondary | ICD-10-CM | POA: Diagnosis not present

## 2022-07-06 DIAGNOSIS — G890 Central pain syndrome: Secondary | ICD-10-CM | POA: Diagnosis not present

## 2022-07-06 DIAGNOSIS — Z79891 Long term (current) use of opiate analgesic: Secondary | ICD-10-CM | POA: Diagnosis not present

## 2022-07-12 LAB — PROTIME-INR: INR: 2.2 — AB (ref 0.80–1.20)

## 2022-07-13 ENCOUNTER — Encounter: Payer: Self-pay | Admitting: Oncology

## 2022-07-19 LAB — PROTIME-INR: INR: 2.5 — AB (ref 0.80–1.20)

## 2022-07-20 ENCOUNTER — Other Ambulatory Visit: Payer: Self-pay | Admitting: Oncology

## 2022-07-21 ENCOUNTER — Encounter: Payer: Self-pay | Admitting: *Deleted

## 2022-07-26 NOTE — Progress Notes (Signed)
[facility] Quality Team Note  Name: CATHALEEN KOROL Date of Birth: August 23, 1970 MRN: [id number] Date: 07/26/2022  [facility] Quality Team has reviewed this patient's chart, please see recommendations below:  [facility] Quality Other; (KED GAP- PATIENT HAS eGFR BUT NEEDS URINE MICROALBUMIN/CREATININE RATIO IN ORDER TO CLOSE THE KED GAP FOR 2023)

## 2022-07-29 DIAGNOSIS — G5602 Carpal tunnel syndrome, left upper limb: Secondary | ICD-10-CM | POA: Diagnosis not present

## 2022-07-29 DIAGNOSIS — M65312 Trigger thumb, left thumb: Secondary | ICD-10-CM | POA: Diagnosis not present

## 2022-08-02 ENCOUNTER — Encounter: Payer: Self-pay | Admitting: Oncology

## 2022-08-02 LAB — PROTIME-INR: INR: 2.3 — AB (ref 0.80–1.20)

## 2022-08-08 ENCOUNTER — Other Ambulatory Visit: Payer: Self-pay | Admitting: Neurology

## 2022-08-08 DIAGNOSIS — G894 Chronic pain syndrome: Secondary | ICD-10-CM | POA: Diagnosis not present

## 2022-08-08 DIAGNOSIS — Z79891 Long term (current) use of opiate analgesic: Secondary | ICD-10-CM | POA: Diagnosis not present

## 2022-08-08 DIAGNOSIS — M62830 Muscle spasm of back: Secondary | ICD-10-CM | POA: Diagnosis not present

## 2022-08-08 DIAGNOSIS — G890 Central pain syndrome: Secondary | ICD-10-CM | POA: Diagnosis not present

## 2022-08-09 ENCOUNTER — Encounter: Payer: Self-pay | Admitting: Internal Medicine

## 2022-08-09 ENCOUNTER — Ambulatory Visit: Payer: Medicare Other | Admitting: Internal Medicine

## 2022-08-09 VITALS — BP 122/72 | HR 83 | Ht 65.0 in | Wt 202.6 lb

## 2022-08-09 DIAGNOSIS — Z6836 Body mass index (BMI) 36.0-36.9, adult: Secondary | ICD-10-CM | POA: Diagnosis not present

## 2022-08-09 DIAGNOSIS — E785 Hyperlipidemia, unspecified: Secondary | ICD-10-CM

## 2022-08-09 DIAGNOSIS — E1165 Type 2 diabetes mellitus with hyperglycemia: Secondary | ICD-10-CM

## 2022-08-09 DIAGNOSIS — Z7901 Long term (current) use of anticoagulants: Secondary | ICD-10-CM | POA: Diagnosis not present

## 2022-08-09 DIAGNOSIS — D6861 Antiphospholipid syndrome: Secondary | ICD-10-CM | POA: Diagnosis not present

## 2022-08-09 LAB — POCT GLYCOSYLATED HEMOGLOBIN (HGB A1C): Hemoglobin A1C: 6.8 % — AB (ref 4.0–5.6)

## 2022-08-09 LAB — PROTIME-INR: INR: 3.9 — AB (ref 0.80–1.20)

## 2022-08-09 NOTE — Patient Instructions (Signed)
Please continue: - Metformin 1000 mg 2x a day, with meals  Increase: - Farxiga 10 mg daily in a.m.  Please stay well hydrated.  Please return in 4 months with your sugar log.

## 2022-08-09 NOTE — Telephone Encounter (Signed)
I called pt and LMVM for her to call back about BOTOX.  [facility] sent fax hae been trying to contact you about BOTOX.  She has not read mychart message either.
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
## 2022-08-10 ENCOUNTER — Encounter: Payer: Self-pay | Admitting: Oncology

## 2022-08-10 ENCOUNTER — Encounter: Payer: Self-pay | Admitting: *Deleted

## 2022-08-11 DIAGNOSIS — M25532 Pain in left wrist: Secondary | ICD-10-CM | POA: Diagnosis not present

## 2022-08-16 LAB — PROTIME-INR: INR: 3.2 — AB (ref 0.80–1.20)

## 2022-08-17 ENCOUNTER — Encounter: Payer: Medicare Other | Admitting: Family Medicine

## 2022-08-18 ENCOUNTER — Encounter: Payer: Self-pay | Admitting: Oncology

## 2022-08-18 ENCOUNTER — Telehealth: Payer: Self-pay

## 2022-08-18 NOTE — Telephone Encounter (Signed)
I called and Left a detail message for patient to come and pick up her ozempic from patient assistance.  Kendyl Festa,cma

## 2022-08-19 ENCOUNTER — Telehealth: Payer: Self-pay

## 2022-08-19 NOTE — Telephone Encounter (Signed)
Pt picked up medication Ozempic (4 boxes) from Patient assistance @ 12:45pm

## 2022-08-23 ENCOUNTER — Encounter: Payer: Self-pay | Admitting: Oncology

## 2022-08-23 LAB — PROTIME-INR: INR: 3.2 — AB (ref 0.80–1.20)

## 2022-08-24 ENCOUNTER — Telehealth: Payer: Self-pay

## 2022-08-24 NOTE — Telephone Encounter (Signed)
-----   Message from Sindy Guadeloupe, MD sent at 08/23/2022  2:15 PM EST ----- Please let her know to stay on the same dose of coumadin

## 2022-08-24 NOTE — Telephone Encounter (Signed)
Informed patient on PT/ INR and to stay on same dose of coumadin. Patient agrees and verbalizes understanding.

## 2022-08-26 ENCOUNTER — Encounter: Payer: Self-pay | Admitting: *Deleted

## 2022-08-30 LAB — PROTIME-INR: INR: 2.6 — AB (ref 0.80–1.20)

## 2022-08-31 ENCOUNTER — Encounter: Payer: Self-pay | Admitting: Oncology

## 2022-09-06 DIAGNOSIS — Z79891 Long term (current) use of opiate analgesic: Secondary | ICD-10-CM | POA: Diagnosis not present

## 2022-09-06 DIAGNOSIS — G890 Central pain syndrome: Secondary | ICD-10-CM | POA: Diagnosis not present

## 2022-09-06 DIAGNOSIS — G894 Chronic pain syndrome: Secondary | ICD-10-CM | POA: Diagnosis not present

## 2022-09-06 DIAGNOSIS — M62830 Muscle spasm of back: Secondary | ICD-10-CM | POA: Diagnosis not present

## 2022-09-07 ENCOUNTER — Encounter: Payer: Self-pay | Admitting: Oncology

## 2022-09-07 ENCOUNTER — Encounter: Payer: Self-pay | Admitting: *Deleted

## 2022-09-07 DIAGNOSIS — D6861 Antiphospholipid syndrome: Secondary | ICD-10-CM | POA: Diagnosis not present

## 2022-09-07 DIAGNOSIS — Z7901 Long term (current) use of anticoagulants: Secondary | ICD-10-CM | POA: Diagnosis not present

## 2022-09-07 LAB — PROTIME-INR: INR: 3.2 — AB (ref 0.80–1.20)

## 2022-09-12 ENCOUNTER — Encounter (INDEPENDENT_AMBULATORY_CARE_PROVIDER_SITE_OTHER): Payer: Self-pay

## 2022-09-13 ENCOUNTER — Encounter: Payer: Self-pay | Admitting: Nurse Practitioner

## 2022-09-13 ENCOUNTER — Ambulatory Visit (INDEPENDENT_AMBULATORY_CARE_PROVIDER_SITE_OTHER): Payer: Medicare Other | Admitting: Nurse Practitioner

## 2022-09-13 VITALS — BP 124/72 | HR 93 | Temp 98.7°F | Ht 65.0 in | Wt 206.0 lb

## 2022-09-13 DIAGNOSIS — F32A Depression, unspecified: Secondary | ICD-10-CM | POA: Diagnosis not present

## 2022-09-13 DIAGNOSIS — E119 Type 2 diabetes mellitus without complications: Secondary | ICD-10-CM

## 2022-09-13 DIAGNOSIS — D6861 Antiphospholipid syndrome: Secondary | ICD-10-CM

## 2022-09-13 DIAGNOSIS — E559 Vitamin D deficiency, unspecified: Secondary | ICD-10-CM

## 2022-09-13 DIAGNOSIS — E785 Hyperlipidemia, unspecified: Secondary | ICD-10-CM | POA: Diagnosis not present

## 2022-09-13 DIAGNOSIS — Z1329 Encounter for screening for other suspected endocrine disorder: Secondary | ICD-10-CM

## 2022-09-13 DIAGNOSIS — F419 Anxiety disorder, unspecified: Secondary | ICD-10-CM | POA: Diagnosis not present

## 2022-09-13 NOTE — Progress Notes (Signed)
Tomasita Morrow, NP-C Phone: [phone]  Amanda Vasquez is a 52 y.o. female who presents today for transfer of care. She has no complaints or new concerns today. She is doing well on all of her medications.   DIABETES Disease Monitoring: Blood Sugar ranges- 100-140s Polyuria/phagia/dipsia- No      Optho- No Medications: Compliance- Metformin and Farxiga Hypoglycemic symptoms- No  Lab Results  Component Value Date   HGBA1C 6.8 (A) 08/09/2022   HYPERLIPIDEMIA Symptoms Chest pain on exertion:  No   Leg claudication:   No Medications: Compliance- Lipitor Right upper quadrant pain- No  Muscle aches- No Lipid Panel     Component Value Date/Time   CHOL 114 02/15/2022 0939   CHOL 113 10/21/2020 1533   TRIG 69.0 02/15/2022 0939   HDL 63.20 02/15/2022 0939   HDL 41 10/21/2020 1533   CHOLHDL 2 02/15/2022 0939   VLDL 13.8 02/15/2022 0939   LDLCALC 37 02/15/2022 0939   LDLCALC 41 10/21/2020 1533   LDLDIRECT 80.0 09/19/2018 1139   LABVLDL 31 10/21/2020 1533   Anxiety/Depression- Patient reports doing well on Zoloft, Wellbutrin, Trazodone and Xanax. She feels as though her symptoms are managed. She is not currently seeing Psych or a therapist/counselor. Denies SI/HI.    Social History   Tobacco Use  Smoking Status Former   Packs/day: 0.25   Years: 20.00   Total pack years: 5.00   Types: Cigarettes   Start date: 06/08/1986   Quit date: 08/22/2010   Years since quitting: 12.0  Smokeless Tobacco Never    Current Outpatient Medications on File Prior to Visit  Medication Sig Dispense Refill   ALPRAZolam (XANAX) 0.5 MG tablet TAKE ONE TABLET BY MOUTH EVERY DAY AS NEEDED FOR ANXIETY 30 tablet 5   atorvastatin (LIPITOR) 40 MG tablet Take 1 tablet (40 mg total) by mouth daily at 6 PM. 90 tablet 3   botulinum toxin Type A (BOTOX) 200 units injection Provider to inject 155 units into the muscles of the head and neck every 3 months. Discard remainder. 1 each 0   buPROPion (WELLBUTRIN XL)  300 MG 24 hr tablet TAKE 1 TABLET BY MOUTH DAILY 90 tablet 3   calcium carbonate (OSCAL) 1500 (600 Ca) MG TABS tablet Take 1,500 mg by mouth daily with breakfast.     Cetirizine HCl 10 MG CAPS Take 1 capsule (10 mg total) by mouth daily as needed. Take by mouth. 30 capsule    Cholecalciferol (VITAMIN D) 50 MCG (2000 UT) CAPS Take 2,000 Units by mouth daily.     CYANOCOBALAMIN PO Take by mouth daily. 1 gummy     cyclobenzaprine (FLEXERIL) 10 MG tablet Take 1 tablet (10 mg total) by mouth at bedtime. 90 tablet 3   dapagliflozin propanediol (FARXIGA) 10 MG TABS tablet Take 1 tablet (10 mg total) by mouth daily before breakfast. 90 tablet 3   enoxaparin (LOVENOX) 80 MG/0.8ML injection Inject 0.8 mLs (80 mg total) into the skin as directed. Pt stop coumadin and start lovenox bid for 5 days before surgery and start back on coumadin and lovenox  day after surgery. Will need lovenox bid until INR is at 2.0 16 mL 0   Galcanezumab-gnlm (EMGALITY) 120 MG/ML SOAJ Inject 120 mg into the skin every 30 (thirty) days.     GRALISE 600 MG TABS Take 1,800 mg by mouth every evening.      metFORMIN (GLUCOPHAGE) 1000 MG tablet TAKE ONE (1) TABLET BY MOUTH TWO TIMES PER DAY WITH MEALS 180  tablet 3   morphine (MS CONTIN) 60 MG 12 hr tablet Take 60 mg by mouth every 12 (twelve) hours.     Multiple Vitamin (MULTIVITAMIN) tablet Take 1 tablet by mouth daily.     NON FORMULARY CBD Gummies     oxyCODONE (OXY IR/ROXICODONE) 5 MG immediate release tablet Take 1 tablet (5 mg total) by mouth every 4 (four) hours as needed for severe pain. Per pain clinic Dr Hardin Negus 30 tablet    Polyethylene Glycol 3350 (MIRALAX PO) Take 17 g by mouth daily as needed.     promethazine (PHENERGAN) 25 MG tablet Take 1 tablet (25 mg total) by mouth 2 (two) times daily as needed for nausea or vomiting. 60 tablet 5   propranolol ER (INDERAL LA) 80 MG 24 hr capsule TAKE 1 CAPSULE BY MOUTH ONCE DAILY 90 capsule 3   Rimegepant Sulfate (NURTEC) 75 MG TBDP  Take 75 mg by mouth daily as needed. For migraines. Take as close to onset of migraine as possible. One daily maximum. 16 tablet 6   sertraline (ZOLOFT) 100 MG tablet Take 1.5 tablets (150 mg total) by mouth daily. TAKE 150 mg qd 135 tablet 3   traZODone (DESYREL) 100 MG tablet Take 1 tablet (100 mg total) by mouth at bedtime as needed. for sleep 90 tablet 3   valACYclovir (VALTREX) 1000 MG tablet Take 1 tablet (1,000 mg total) by mouth 2 (two) times daily. Bid with outbreak with food x 3-7 days 60 tablet 2   warfarin (COUMADIN) 10 MG tablet TAKE ONE TABLET BY MOUTH EVERY DAY 30 tablet 3   warfarin (COUMADIN) 10 MG tablet Take 1 tablet (10 mg total) by mouth as directed. Pt to have 10 mg daily for 6 days and on 7th day she takes 1/2 tablet. Repeat this each week until a change has been made 29 tablet 3   nitroGLYCERIN (NITROSTAT) 0.4 MG SL tablet Place 1 tablet (0.4 mg total) under the tongue every 5 (five) minutes as needed for chest pain. 25 tablet 3   No current facility-administered medications on file prior to visit.     ROS see history of present illness  Objective  Physical Exam Vitals:   09/13/22 1415  BP: 124/72  Pulse: 93  Temp: 98.7 F (37.1 C)  SpO2: 96%    BP Readings from Last 3 Encounters:  09/13/22 124/72  08/09/22 122/72  06/13/22 100/70   Wt Readings from Last 3 Encounters:  09/13/22 206 lb (93.4 kg)  08/09/22 202 lb 9.6 oz (91.9 kg)  06/13/22 203 lb (92.1 kg)    Physical Exam Constitutional:      General: She is not in acute distress.    Appearance: Normal appearance.  HENT:     Head: Normocephalic.  Cardiovascular:     Rate and Rhythm: Normal rate and regular rhythm.     Heart sounds: Normal heart sounds.  Pulmonary:     Effort: Pulmonary effort is normal.     Breath sounds: Normal breath sounds.  Skin:    General: Skin is warm and dry.  Neurological:     Mental Status: She is alert.  Psychiatric:        Mood and Affect: Mood normal.         Behavior: Behavior normal.    Assessment/Plan: Please see individual problem list.  Type 2 diabetes mellitus without complication, without long-term current use of insulin ([place]) Assessment & Plan: Last A1c- 6.8 in December 2023. Continue Metformin and Farxiga.  Encouraged healthy diet and exercise. Follow up with Endocrinology.  Orders: -     Comprehensive metabolic panel  Anxiety and depression Assessment & Plan: Chronic. Stable. Continue current regimen. Patient declined referral to counselor/therapist today. Encouraged patient to return if symptoms worsen or would like referral.    Hyperlipidemia, unspecified hyperlipidemia type Assessment & Plan: Chronic. Stable on Lipitor 40 mg. Will check lipids today. Encouraged healthy diet and exercise.   Orders: -     Lipid panel  Anti-phospholipid antibody syndrome ([place]) Assessment & Plan: Chronic. On Warfarin for anticoagulation therapy. Checks INR weekly. Denies problems with bleeding today. Continue follow up with Hematology.   Orders: -     CBC with Differential/Platelet  Vitamin D deficiency -     VITAMIN D 25 Hydroxy (Vit-D Deficiency, Fractures)  Screening for thyroid disorder -     TSH   Return in about 6 months (around 03/14/2023).   Tomasita Morrow, NP-C [place]

## 2022-09-13 NOTE — Assessment & Plan Note (Signed)
Chronic. Stable. Continue current regimen. Patient declined referral to counselor/therapist today. Encouraged patient to return if symptoms worsen or would like referral.

## 2022-09-13 NOTE — Assessment & Plan Note (Addendum)
Last A1c- 6.8 in December 2023. Continue Metformin and Farxiga. Encouraged healthy diet and exercise. Follow up with Endocrinology.

## 2022-09-13 NOTE — Assessment & Plan Note (Signed)
Chronic. Stable on Lipitor 40 mg. Will check lipids today. Encouraged healthy diet and exercise.

## 2022-09-13 NOTE — Assessment & Plan Note (Addendum)
Chronic. On Warfarin for anticoagulation therapy. Checks INR weekly. Denies problems with bleeding today. Continue follow up with Hematology.

## 2022-09-14 ENCOUNTER — Telehealth: Payer: Self-pay

## 2022-09-14 ENCOUNTER — Encounter: Payer: Self-pay | Admitting: Oncology

## 2022-09-14 ENCOUNTER — Other Ambulatory Visit: Payer: Self-pay | Admitting: *Deleted

## 2022-09-14 ENCOUNTER — Encounter: Payer: Self-pay | Admitting: Nurse Practitioner

## 2022-09-14 ENCOUNTER — Encounter: Payer: Self-pay | Admitting: *Deleted

## 2022-09-14 LAB — COMPREHENSIVE METABOLIC PANEL
ALT: 24 U/L (ref 0–35)
AST: 22 U/L (ref 0–37)
Albumin: 4.2 g/dL (ref 3.5–5.2)
Alkaline Phosphatase: 64 U/L (ref 39–117)
BUN: 16 mg/dL (ref 6–23)
CO2: 25 mEq/L (ref 19–32)
Calcium: 9.4 mg/dL (ref 8.4–10.5)
Chloride: 101 mEq/L (ref 96–112)
Creatinine, Ser: 0.65 mg/dL (ref 0.40–1.20)
GFR: 101.16 mL/min (ref >60.00)
Glucose, Bld: 178 mg/dL — ABNORMAL HIGH (ref 70–99)
Potassium: 4.2 mEq/L (ref 3.5–5.1)
Sodium: 136 mEq/L (ref 135–145)
Total Bilirubin: 0.4 mg/dL (ref 0.2–1.2)
Total Protein: 6.8 g/dL (ref 6.0–8.3)

## 2022-09-14 LAB — LIPID PANEL
Cholesterol: 167 mg/dL (ref 0–200)
HDL: 91.9 mg/dL (ref >39.00)
LDL Cholesterol: 61 mg/dL (ref 0–99)
NonHDL: 74.61
Total CHOL/HDL Ratio: 2
Triglycerides: 66 mg/dL (ref 0.0–149.0)
VLDL: 13.2 mg/dL (ref 0.0–40.0)

## 2022-09-14 LAB — CBC WITH DIFFERENTIAL/PLATELET
Basophils Absolute: 0.1 10*3/uL (ref 0.0–0.1)
Basophils Relative: 1.6 % (ref 0.0–3.0)
Eosinophils Absolute: 0.1 10*3/uL (ref 0.0–0.7)
Eosinophils Relative: 2.3 % (ref 0.0–5.0)
HCT: 41.2 % (ref 36.0–46.0)
Hemoglobin: 13.5 g/dL (ref 12.0–15.0)
Lymphocytes Relative: 31.2 % (ref 12.0–46.0)
Lymphs Abs: 1.6 10*3/uL (ref 0.7–4.0)
MCHC: 32.8 g/dL (ref 30.0–36.0)
MCV: 86.1 fl (ref 78.0–100.0)
Monocytes Absolute: 0.3 10*3/uL (ref 0.1–1.0)
Monocytes Relative: 5.5 % (ref 3.0–12.0)
Neutro Abs: 3.1 10*3/uL (ref 1.4–7.7)
Neutrophils Relative %: 59.4 % (ref 43.0–77.0)
Platelets: 286 10*3/uL (ref 150.0–400.0)
RBC: 4.79 Mil/uL (ref 3.87–5.11)
RDW: 15.8 % — ABNORMAL HIGH (ref 11.5–15.5)
WBC: 5.2 10*3/uL (ref 4.0–10.5)

## 2022-09-14 LAB — VITAMIN D 25 HYDROXY (VIT D DEFICIENCY, FRACTURES): VITD: 24.29 ng/mL — ABNORMAL LOW (ref 30.00–100.00)

## 2022-09-14 LAB — PROTIME-INR: INR: 3.1 — AB (ref 0.80–1.20)

## 2022-09-14 LAB — TSH: TSH: 0.49 u[IU]/mL (ref 0.35–5.50)

## 2022-09-14 MED ORDER — ENOXAPARIN SODIUM 80 MG/0.8ML IJ SOSY: 80.0000 mg | PREFILLED_SYRINGE | INTRAMUSCULAR | 0 refills | Status: DC

## 2022-09-14 NOTE — Telephone Encounter (Signed)
LMOM to CB in regards to lab results and mychart msg

## 2022-09-15 ENCOUNTER — Encounter: Payer: Self-pay | Admitting: Nurse Practitioner

## 2022-09-23 DIAGNOSIS — G5601 Carpal tunnel syndrome, right upper limb: Secondary | ICD-10-CM | POA: Diagnosis not present

## 2022-09-23 DIAGNOSIS — M65341 Trigger finger, right ring finger: Secondary | ICD-10-CM | POA: Diagnosis not present

## 2022-09-27 LAB — PROTIME-INR: INR: 1 (ref 0.80–1.20)

## 2022-09-28 ENCOUNTER — Encounter: Payer: Self-pay | Admitting: *Deleted

## 2022-09-28 ENCOUNTER — Encounter: Payer: Self-pay | Admitting: Oncology

## 2022-09-29 ENCOUNTER — Other Ambulatory Visit: Payer: Self-pay | Admitting: *Deleted

## 2022-09-29 MED ORDER — ENOXAPARIN SODIUM 80 MG/0.8ML IJ SOSY: 80.0000 mg | PREFILLED_SYRINGE | Freq: Two times a day (BID) | INTRAMUSCULAR | 0 refills | Status: DC

## 2022-10-03 ENCOUNTER — Encounter: Payer: Self-pay | Admitting: Oncology

## 2022-10-03 ENCOUNTER — Inpatient Hospital Stay: Payer: Medicare Other | Attending: Oncology | Admitting: Oncology

## 2022-10-03 VITALS — BP 130/83 | HR 89 | Temp 97.6°F | Resp 19 | Wt 220.1 lb

## 2022-10-03 DIAGNOSIS — E119 Type 2 diabetes mellitus without complications: Secondary | ICD-10-CM | POA: Diagnosis not present

## 2022-10-03 DIAGNOSIS — F32A Depression, unspecified: Secondary | ICD-10-CM | POA: Diagnosis not present

## 2022-10-03 DIAGNOSIS — R0781 Pleurodynia: Secondary | ICD-10-CM | POA: Insufficient documentation

## 2022-10-03 DIAGNOSIS — Z5181 Encounter for therapeutic drug level monitoring: Secondary | ICD-10-CM

## 2022-10-03 DIAGNOSIS — I2699 Other pulmonary embolism without acute cor pulmonale: Secondary | ICD-10-CM | POA: Insufficient documentation

## 2022-10-03 DIAGNOSIS — Z5986 Financial insecurity: Secondary | ICD-10-CM | POA: Diagnosis not present

## 2022-10-03 DIAGNOSIS — Z79624 Long term (current) use of inhibitors of nucleotide synthesis: Secondary | ICD-10-CM | POA: Diagnosis not present

## 2022-10-03 DIAGNOSIS — D6862 Lupus anticoagulant syndrome: Secondary | ICD-10-CM | POA: Insufficient documentation

## 2022-10-03 DIAGNOSIS — E669 Obesity, unspecified: Secondary | ICD-10-CM | POA: Insufficient documentation

## 2022-10-03 DIAGNOSIS — Z7901 Long term (current) use of anticoagulants: Secondary | ICD-10-CM | POA: Diagnosis not present

## 2022-10-03 DIAGNOSIS — G894 Chronic pain syndrome: Secondary | ICD-10-CM | POA: Insufficient documentation

## 2022-10-03 DIAGNOSIS — D6861 Antiphospholipid syndrome: Secondary | ICD-10-CM | POA: Diagnosis not present

## 2022-10-03 DIAGNOSIS — Z87891 Personal history of nicotine dependence: Secondary | ICD-10-CM | POA: Insufficient documentation

## 2022-10-03 DIAGNOSIS — E785 Hyperlipidemia, unspecified: Secondary | ICD-10-CM | POA: Diagnosis not present

## 2022-10-03 DIAGNOSIS — Z9049 Acquired absence of other specified parts of digestive tract: Secondary | ICD-10-CM | POA: Insufficient documentation

## 2022-10-03 DIAGNOSIS — Z86711 Personal history of pulmonary embolism: Secondary | ICD-10-CM | POA: Diagnosis not present

## 2022-10-03 DIAGNOSIS — F419 Anxiety disorder, unspecified: Secondary | ICD-10-CM | POA: Insufficient documentation

## 2022-10-03 DIAGNOSIS — Z79899 Other long term (current) drug therapy: Secondary | ICD-10-CM | POA: Diagnosis not present

## 2022-10-03 DIAGNOSIS — Z818 Family history of other mental and behavioral disorders: Secondary | ICD-10-CM | POA: Insufficient documentation
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
## 2022-10-04 ENCOUNTER — Encounter: Payer: Self-pay | Admitting: Oncology

## 2022-10-04 LAB — PROTIME-INR: INR: 2.7 — AB (ref 0.80–1.20)

## 2022-10-07 DIAGNOSIS — M25631 Stiffness of right wrist, not elsewhere classified: Secondary | ICD-10-CM | POA: Diagnosis not present

## 2022-10-07 DIAGNOSIS — M25641 Stiffness of right hand, not elsewhere classified: Secondary | ICD-10-CM | POA: Diagnosis not present

## 2022-10-11 DIAGNOSIS — D6861 Antiphospholipid syndrome: Secondary | ICD-10-CM | POA: Diagnosis not present

## 2022-10-11 DIAGNOSIS — Z7901 Long term (current) use of anticoagulants: Secondary | ICD-10-CM | POA: Diagnosis not present

## 2022-10-11 LAB — PROTIME-INR: INR: 2.6 — AB (ref 0.80–1.20)

## 2022-10-12 ENCOUNTER — Encounter: Payer: Self-pay | Admitting: *Deleted

## 2022-10-12 ENCOUNTER — Encounter: Payer: Self-pay | Admitting: Oncology

## 2022-10-13 DIAGNOSIS — G890 Central pain syndrome: Secondary | ICD-10-CM | POA: Diagnosis not present

## 2022-10-13 DIAGNOSIS — Z79891 Long term (current) use of opiate analgesic: Secondary | ICD-10-CM | POA: Diagnosis not present

## 2022-10-13 DIAGNOSIS — G894 Chronic pain syndrome: Secondary | ICD-10-CM | POA: Diagnosis not present

## 2022-10-13 DIAGNOSIS — M62830 Muscle spasm of back: Secondary | ICD-10-CM | POA: Diagnosis not present

## 2022-10-19 ENCOUNTER — Encounter: Payer: Self-pay | Admitting: *Deleted

## 2022-10-19 ENCOUNTER — Encounter: Payer: Self-pay | Admitting: Oncology

## 2022-10-19 LAB — PROTIME-INR: INR: 2.8 — AB (ref 0.80–1.20)

## 2022-10-26 LAB — PROTIME-INR: INR: 3.1 — AB (ref 0.80–1.20)

## 2022-10-27 ENCOUNTER — Encounter: Payer: Self-pay | Admitting: *Deleted

## 2022-10-27 ENCOUNTER — Telehealth: Payer: Self-pay

## 2022-10-27 ENCOUNTER — Encounter: Payer: Self-pay | Admitting: Oncology

## 2022-10-27 NOTE — Telephone Encounter (Signed)
LMOM for to CB to go over meds and confirm pharmacy and to gather more info on reason for visit.

## 2022-10-28 ENCOUNTER — Encounter: Payer: Self-pay | Admitting: Nurse Practitioner

## 2022-10-28 ENCOUNTER — Ambulatory Visit (INDEPENDENT_AMBULATORY_CARE_PROVIDER_SITE_OTHER): Payer: Medicare Other | Admitting: Nurse Practitioner

## 2022-10-28 VITALS — BP 120/60 | HR 99 | Temp 99.2°F | Ht 65.0 in | Wt 232.4 lb

## 2022-10-28 DIAGNOSIS — F32A Depression, unspecified: Secondary | ICD-10-CM

## 2022-10-28 DIAGNOSIS — F419 Anxiety disorder, unspecified: Secondary | ICD-10-CM | POA: Diagnosis not present

## 2022-10-28 DIAGNOSIS — E669 Obesity, unspecified: Secondary | ICD-10-CM | POA: Diagnosis not present

## 2022-10-28 MED ORDER — SERTRALINE HCL 50 MG PO TABS: 50.0000 mg | ORAL_TABLET | Freq: Every day | ORAL | 3 refills | Status: DC

## 2022-10-28 NOTE — Progress Notes (Unsigned)
Amanda Morrow, NP-C Phone: [phone]  Amanda Vasquez is a 52 y.o. female who presents today for weight gain due to worsening anxiety and depression.   Patient reports worsening anxiety and depression which is causing her to eat more, therefore causing her weight gain. Patient reports she has an older dog, and she has increased anxiety and difficulty sleeping because she is worried he is going to pass away. Reports she has been eating a lot at night due to the insomnia. She reports episodes of binge eating. She reports eating a lot of sweets. Reports her highest weight was 304, she lost a lot of weight naturally with diet and exercise, almost 100 lbs, and has started to put the weight back on due to her increased mental health problems. She reports the weight gain starting back in November. She has gained 26 pounds over the last 6 weeks. She is tearful and would like help with both her mental health and weight gain. She is interested in bariatric surgery. She used to see [facility] in [place].   Social History   Tobacco Use  Smoking Status Former   Packs/day: 0.25   Years: 20.00   Total pack years: 5.00   Types: Cigarettes   Start date: 06/08/1986   Quit date: 08/22/2010   Years since quitting: 12.2  Smokeless Tobacco Never    Current Outpatient Medications on File Prior to Visit  Medication Sig Dispense Refill   ALPRAZolam (XANAX) 0.5 MG tablet TAKE ONE TABLET BY MOUTH EVERY DAY AS NEEDED FOR ANXIETY 30 tablet 5   atorvastatin (LIPITOR) 40 MG tablet Take 1 tablet (40 mg total) by mouth daily at 6 PM. 90 tablet 3   botulinum toxin Type A (BOTOX) 200 units injection Provider to inject 155 units into the muscles of the head and neck every 3 months. Discard remainder. 1 each 0   buPROPion (WELLBUTRIN XL) 300 MG 24 hr tablet TAKE 1 TABLET BY MOUTH DAILY 90 tablet 3   calcium carbonate (OSCAL) 1500 (600 Ca) MG TABS tablet Take 1,500 mg by mouth daily with breakfast.      Cetirizine HCl 10 MG CAPS Take 1 capsule (10 mg total) by mouth daily as needed. Take by mouth. 30 capsule    Cholecalciferol (VITAMIN D) 50 MCG (2000 UT) CAPS Take 2,000 Units by mouth daily.     CYANOCOBALAMIN PO Take by mouth daily. 1 gummy     cyclobenzaprine (FLEXERIL) 10 MG tablet Take 1 tablet (10 mg total) by mouth at bedtime. 90 tablet 3   dapagliflozin propanediol (FARXIGA) 10 MG TABS tablet Take 1 tablet (10 mg total) by mouth daily before breakfast. (Patient taking differently: Take 10 mg by mouth daily before breakfast. Pt takes 20 mg) 90 tablet 3   enoxaparin (LOVENOX) 80 MG/0.8ML injection Inject 0.8 mLs (80 mg total) into the skin every 12 (twelve) hours. Pt has surgery and after she has to be on coumadin and lovenox until INR is 2 and then stop the lovenox. INR1.2 needs more injection 16 mL 0   Galcanezumab-gnlm (EMGALITY) 120 MG/ML SOAJ Inject 120 mg into the skin every 30 (thirty) days.     GRALISE 600 MG TABS Take 1,800 mg by mouth every evening.      LYRICA 75 MG capsule Take 1 capsule twice a day by oral route.     metFORMIN (GLUCOPHAGE) 1000 MG tablet TAKE ONE (1) TABLET BY MOUTH TWO TIMES PER DAY WITH MEALS 180 tablet 3  morphine (MS CONTIN) 60 MG 12 hr tablet Take 60 mg by mouth every 12 (twelve) hours.     Multiple Vitamin (MULTIVITAMIN) tablet Take 1 tablet by mouth daily.     nitroGLYCERIN (NITROSTAT) 0.4 MG SL tablet Place 1 tablet (0.4 mg total) under the tongue every 5 (five) minutes as needed for chest pain. 25 tablet 3   NON FORMULARY CBD Gummies     oxyCODONE (OXY IR/ROXICODONE) 5 MG immediate release tablet Take 1 tablet (5 mg total) by mouth every 4 (four) hours as needed for severe pain. Per pain clinic Dr Hardin Negus 30 tablet    Polyethylene Glycol 3350 (MIRALAX PO) Take 17 g by mouth daily as needed.     promethazine (PHENERGAN) 25 MG tablet Take 1 tablet (25 mg total) by mouth 2 (two) times daily as needed for nausea or vomiting. 60 tablet 5   propranolol ER  (INDERAL LA) 80 MG 24 hr capsule TAKE 1 CAPSULE BY MOUTH ONCE DAILY 90 capsule 3   Rimegepant Sulfate (NURTEC) 75 MG TBDP Take 75 mg by mouth daily as needed. For migraines. Take as close to onset of migraine as possible. One daily maximum. 16 tablet 6   sertraline (ZOLOFT) 100 MG tablet Take 1.5 tablets (150 mg total) by mouth daily. TAKE 150 mg qd 135 tablet 3   traZODone (DESYREL) 100 MG tablet Take 1 tablet (100 mg total) by mouth at bedtime as needed. for sleep 90 tablet 3   valACYclovir (VALTREX) 1000 MG tablet Take 1 tablet (1,000 mg total) by mouth 2 (two) times daily. Bid with outbreak with food x 3-7 days 60 tablet 2   warfarin (COUMADIN) 10 MG tablet TAKE ONE TABLET BY MOUTH EVERY DAY 30 tablet 3   warfarin (COUMADIN) 10 MG tablet Take 1 tablet (10 mg total) by mouth as directed. Pt to have 10 mg daily for 6 days and on 7th day she takes 1/2 tablet. Repeat this each week until a change has been made 29 tablet 3   No current facility-administered medications on file prior to visit.     ROS see history of present illness  Objective  Physical Exam Vitals:   10/28/22 1613  BP: 120/60  Pulse: 99  Temp: 99.2 F (37.3 C)  SpO2: 98%    BP Readings from Last 3 Encounters:  10/28/22 120/60  10/03/22 130/83  09/13/22 124/72   Wt Readings from Last 3 Encounters:  10/28/22 232 lb 6.4 oz (105.4 kg)  10/03/22 220 lb 1.6 oz (99.8 kg)  09/13/22 206 lb (93.4 kg)    Physical Exam Constitutional:      General: She is not in acute distress.    Appearance: Normal appearance.  HENT:     Head: Normocephalic.  Cardiovascular:     Rate and Rhythm: Normal rate and regular rhythm.     Heart sounds: Normal heart sounds.  Pulmonary:     Effort: Pulmonary effort is normal.     Breath sounds: Normal breath sounds.  Skin:    General: Skin is warm and dry.  Neurological:     General: No focal deficit present.     Mental Status: She is alert.  Psychiatric:        Mood and Affect: Mood  is anxious. Affect is tearful.        Behavior: Behavior normal.    Assessment/Plan: Please see individual problem list.  Anxiety and depression Assessment & Plan: Patient would like to increase her Zoloft before changing  any of her other medications or trying new ones. Will increase to 150 mg daily. Continue Xanax, Wellbutrin and Trazodone. Patient open to referral to Psychiatry for additional help. Referral placed. Denies SI/HI. Will monitor. Encouraged patient to contact if changing or worsening symptoms.   Orders: -     Sertraline HCl; Take 1 tablet (50 mg total) by mouth daily. Take with 100 mg tablet to equal 150 mg daily.  Dispense: 90 tablet; Refill: 3 -     Ambulatory referral to Psychiatry  Obesity (BMI 30-39.9) Assessment & Plan: Discussed monitoring diet closely, decreasing sweets and not eating late at night. Increasing Zoloft and referral to Psychiatry placed to help with anxiety/depression. Will refer to bariatric surgery for evaluation. Discussed going back to healthy weight and wellness, patient unable due to appointments being in [place] and the required frequent travel. Encouraged patient to look into Weight Watchers program as she expressed looking for accountability.   Orders: -     Amb Referral to Bariatric Surgery   Return in about 3 months (around 01/28/2023), or if symptoms worsen or fail to improve.   Amanda Morrow, NP-C Home

## 2022-10-31 NOTE — Assessment & Plan Note (Signed)
Patient would like to increase her Zoloft before changing any of her other medications or trying new ones. Will increase to 150 mg daily. Continue Xanax, Wellbutrin and Trazodone. Patient open to referral to Psychiatry for additional help. Referral placed. Denies SI/HI. Will monitor. Encouraged patient to contact if changing or worsening symptoms.

## 2022-10-31 NOTE — Assessment & Plan Note (Addendum)
Discussed monitoring diet closely, decreasing sweets and not eating late at night. Increasing Zoloft and referral to Psychiatry placed to help with anxiety/depression. Will refer to bariatric surgery for evaluation. Discussed going back to healthy weight and wellness, patient unable due to appointments being in [place] and the required frequent travel. Encouraged patient to look into Weight Watchers program as she expressed looking for accountability.

## 2022-11-01 LAB — PROTIME-INR: INR: 3.1 — AB (ref 0.80–1.20)

## 2022-11-02 ENCOUNTER — Encounter: Payer: Self-pay | Admitting: Oncology

## 2022-11-08 DIAGNOSIS — D6861 Antiphospholipid syndrome: Secondary | ICD-10-CM | POA: Diagnosis not present

## 2022-11-08 DIAGNOSIS — Z7901 Long term (current) use of anticoagulants: Secondary | ICD-10-CM | POA: Diagnosis not present

## 2022-11-08 LAB — PROTIME-INR: INR: 2 — AB (ref 0.80–1.20)

## 2022-11-09 ENCOUNTER — Encounter: Payer: Self-pay | Admitting: *Deleted

## 2022-11-09 ENCOUNTER — Encounter: Payer: Self-pay | Admitting: Oncology

## 2022-11-15 LAB — PROTIME-INR: INR: 2.9 — AB (ref 0.80–1.20)

## 2022-11-17 ENCOUNTER — Encounter: Payer: Self-pay | Admitting: Oncology

## 2022-11-17 ENCOUNTER — Encounter: Payer: Self-pay | Admitting: *Deleted

## 2022-11-21 DIAGNOSIS — M62830 Muscle spasm of back: Secondary | ICD-10-CM | POA: Diagnosis not present

## 2022-11-21 DIAGNOSIS — G894 Chronic pain syndrome: Secondary | ICD-10-CM | POA: Diagnosis not present

## 2022-11-21 DIAGNOSIS — G890 Central pain syndrome: Secondary | ICD-10-CM | POA: Diagnosis not present

## 2022-11-21 DIAGNOSIS — Z79891 Long term (current) use of opiate analgesic: Secondary | ICD-10-CM | POA: Diagnosis not present

## 2022-11-22 LAB — PROTIME-INR: INR: 3.2 — AB (ref 0.80–1.20)

## 2022-11-23 ENCOUNTER — Encounter: Payer: Self-pay | Admitting: Oncology

## 2022-11-23 ENCOUNTER — Encounter: Payer: Self-pay | Admitting: *Deleted

## 2022-11-30 ENCOUNTER — Encounter: Payer: Self-pay | Admitting: Oncology

## 2022-11-30 DIAGNOSIS — Z4789 Encounter for other orthopedic aftercare: Secondary | ICD-10-CM | POA: Diagnosis not present

## 2022-11-30 DIAGNOSIS — M65311 Trigger thumb, right thumb: Secondary | ICD-10-CM | POA: Diagnosis not present

## 2022-11-30 DIAGNOSIS — M65312 Trigger thumb, left thumb: Secondary | ICD-10-CM | POA: Diagnosis not present

## 2022-11-30 LAB — PROTIME-INR: INR: 3.3 — AB (ref 0.80–1.20)

## 2022-12-01 ENCOUNTER — Encounter: Payer: Self-pay | Admitting: *Deleted

## 2022-12-07 DIAGNOSIS — M65311 Trigger thumb, right thumb: Secondary | ICD-10-CM | POA: Diagnosis not present

## 2022-12-08 DIAGNOSIS — D6861 Antiphospholipid syndrome: Secondary | ICD-10-CM | POA: Diagnosis not present

## 2022-12-08 DIAGNOSIS — Z7901 Long term (current) use of anticoagulants: Secondary | ICD-10-CM | POA: Diagnosis not present

## 2022-12-08 LAB — PROTIME-INR: INR: 1.1 (ref 0.80–1.20)

## 2022-12-09 ENCOUNTER — Encounter: Payer: Self-pay | Admitting: Oncology

## 2022-12-09 ENCOUNTER — Ambulatory Visit: Payer: Medicare Other | Admitting: Internal Medicine

## 2022-12-12 ENCOUNTER — Encounter: Payer: Self-pay | Admitting: *Deleted

## 2022-12-13 ENCOUNTER — Ambulatory Visit: Payer: Medicare Other | Attending: Cardiovascular Disease | Admitting: Cardiovascular Disease

## 2022-12-13 ENCOUNTER — Encounter: Payer: Self-pay | Admitting: *Deleted

## 2022-12-13 ENCOUNTER — Encounter: Payer: Self-pay | Admitting: Cardiovascular Disease

## 2022-12-13 ENCOUNTER — Encounter: Payer: Self-pay | Admitting: Nurse Practitioner

## 2022-12-13 ENCOUNTER — Encounter: Payer: Self-pay | Admitting: Oncology

## 2022-12-13 VITALS — BP 110/60 | HR 75 | Ht 65.0 in | Wt 234.0 lb

## 2022-12-13 DIAGNOSIS — E785 Hyperlipidemia, unspecified: Secondary | ICD-10-CM

## 2022-12-13 DIAGNOSIS — D6861 Antiphospholipid syndrome: Secondary | ICD-10-CM | POA: Diagnosis not present

## 2022-12-13 DIAGNOSIS — I2510 Atherosclerotic heart disease of native coronary artery without angina pectoris: Secondary | ICD-10-CM

## 2022-12-13 LAB — PROTIME-INR: INR: 2.2 — AB (ref 0.80–1.20)

## 2022-12-13 NOTE — Patient Instructions (Signed)
Medication Instructions:  No changes *If you need a refill on your cardiac medications before your next appointment, please call your pharmacy*   Lab Work: None ordered If you have labs (blood work) drawn today and your tests are completely normal, you will receive your results only by: MyChart Message (if you have MyChart) OR A paper copy in the mail If you have any lab test that is abnormal or we need to change your treatment, we will call you to review the results.   Testing/Procedures: None ordered   Follow-Up: At [facility], you and your health needs are our priority.  As part of our continuing mission to provide you with exceptional heart care, we have created designated Provider Care Teams.  These Care Teams include your primary Cardiologist (physician) and Advanced Practice Providers (APPs -  Physician Assistants and Nurse Practitioners) who all work together to provide you with the care you need, when you need it.  We recommend signing up for the patient portal called "MyChart".  Sign up information is provided on this After Visit Summary.  MyChart is used to connect with patients for Virtual Visits (Telemedicine).  Patients are able to view lab/test results, encounter notes, upcoming appointments, etc.  Non-urgent messages can be sent to your provider as well.   To learn more about what you can do with MyChart, go to [email or web address].    Your next appointment:   12 month(s)  Provider:   You may see Dr. Arida or one of the following Advanced Practice Providers on your designated Care Team:   Christopher Berge, NP Ryan Dunn, PA-C Cadence Furth, PA-C Sheri Hammock, NP    
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
## 2022-12-20 DIAGNOSIS — Z79891 Long term (current) use of opiate analgesic: Secondary | ICD-10-CM | POA: Diagnosis not present

## 2022-12-20 DIAGNOSIS — M62830 Muscle spasm of back: Secondary | ICD-10-CM | POA: Diagnosis not present

## 2022-12-20 DIAGNOSIS — G890 Central pain syndrome: Secondary | ICD-10-CM | POA: Diagnosis not present

## 2022-12-20 DIAGNOSIS — G894 Chronic pain syndrome: Secondary | ICD-10-CM | POA: Diagnosis not present

## 2022-12-20 LAB — PROTIME-INR: INR: 3 — AB (ref 0.80–1.20)

## 2022-12-21 ENCOUNTER — Other Ambulatory Visit: Payer: Self-pay | Admitting: Nurse Practitioner

## 2022-12-21 ENCOUNTER — Encounter: Payer: Self-pay | Admitting: Oncology

## 2022-12-21 ENCOUNTER — Telehealth: Payer: Self-pay

## 2022-12-21 DIAGNOSIS — F32A Depression, unspecified: Secondary | ICD-10-CM

## 2022-12-21 DIAGNOSIS — E785 Hyperlipidemia, unspecified: Secondary | ICD-10-CM

## 2022-12-21 DIAGNOSIS — F5104 Psychophysiologic insomnia: Secondary | ICD-10-CM

## 2022-12-21 NOTE — Telephone Encounter (Signed)
Called and spoke with pt in regards to the refill request for Xanax, I informed her that before [name], NP would send in refills for this she would need to sign the controlled substance contract.    Pt verbalized understanding and stated she will be by tomorrow to sign the contract, the letter will be upfront in the designated area upfront for pt to sign and once sign can place in provider folder.

## 2022-12-21 NOTE — Telephone Encounter (Signed)
Called pt informing her she will need to sign controlled substance contract, pt verbalized understanding and will be by tomorrow to sign form once sign Ms. Arville Lime will send in refill.

## 2022-12-22 DIAGNOSIS — M25641 Stiffness of right hand, not elsewhere classified: Secondary | ICD-10-CM | POA: Diagnosis not present

## 2022-12-27 LAB — PROTIME-INR: INR: 3.1 — AB (ref 0.80–1.20)

## 2022-12-28 ENCOUNTER — Encounter: Payer: Self-pay | Admitting: Oncology

## 2022-12-28 ENCOUNTER — Encounter: Payer: Self-pay | Admitting: *Deleted

## 2022-12-29 ENCOUNTER — Encounter: Payer: Self-pay | Admitting: Internal Medicine

## 2022-12-29 ENCOUNTER — Ambulatory Visit: Payer: Medicare Other | Admitting: Internal Medicine

## 2022-12-29 VITALS — BP 108/60 | HR 81 | Ht 65.0 in | Wt 234.4 lb

## 2022-12-29 DIAGNOSIS — E1169 Type 2 diabetes mellitus with other specified complication: Secondary | ICD-10-CM

## 2022-12-29 DIAGNOSIS — Z7984 Long term (current) use of oral hypoglycemic drugs: Secondary | ICD-10-CM | POA: Diagnosis not present

## 2022-12-29 DIAGNOSIS — E785 Hyperlipidemia, unspecified: Secondary | ICD-10-CM

## 2022-12-29 DIAGNOSIS — E119 Type 2 diabetes mellitus without complications: Secondary | ICD-10-CM

## 2022-12-29 DIAGNOSIS — E1165 Type 2 diabetes mellitus with hyperglycemia: Secondary | ICD-10-CM | POA: Diagnosis not present

## 2022-12-29 DIAGNOSIS — E66812 Obesity, class 2: Secondary | ICD-10-CM

## 2022-12-29 DIAGNOSIS — Z6836 Body mass index (BMI) 36.0-36.9, adult: Secondary | ICD-10-CM

## 2022-12-29 LAB — POCT GLYCOSYLATED HEMOGLOBIN (HGB A1C): Hemoglobin A1C: 9.3 % — AB (ref 4.0–5.6)

## 2022-12-29 MED ORDER — INSULIN PEN NEEDLE 32G X 4 MM MISC: 3 refills | Status: AC

## 2022-12-29 MED ORDER — LANTUS SOLOSTAR 100 UNIT/ML ~~LOC~~ SOPN: 12.0000 [IU] | PEN_INJECTOR | Freq: Every day | SUBCUTANEOUS | 3 refills | Status: DC

## 2022-12-29 MED ORDER — DAPAGLIFLOZIN PROPANEDIOL 10 MG PO TABS
10.0000 mg | ORAL_TABLET | Freq: Every day | ORAL | 3 refills | Status: DC
Start: 2022-12-29 — End: 2024-01-24

## 2022-12-29 MED ORDER — METFORMIN HCL 1000 MG PO TABS
ORAL_TABLET | ORAL | 3 refills | Status: DC
Start: 2022-12-29 — End: 2023-11-08

## 2022-12-29 NOTE — Patient Instructions (Addendum)
Please continue: - Metformin 1000 mg 2x a day, with meals - Farxiga 10 mg daily in a.m.  Start: - Lantus 12 units at bedtime. Increase by 2 units every 2 days until sugars in am are <130, or you reach 30 units.  Please return in 3 months with your sugar log.
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
## 2023-01-03 DIAGNOSIS — Z7901 Long term (current) use of anticoagulants: Secondary | ICD-10-CM | POA: Diagnosis not present

## 2023-01-03 DIAGNOSIS — D6861 Antiphospholipid syndrome: Secondary | ICD-10-CM | POA: Diagnosis not present

## 2023-01-03 LAB — PROTIME-INR: INR: 3.4 — AB (ref 0.80–1.20)

## 2023-01-04 ENCOUNTER — Encounter: Payer: Self-pay | Admitting: Oncology

## 2023-01-05 ENCOUNTER — Encounter: Payer: Self-pay | Admitting: *Deleted

## 2023-01-10 LAB — PROTIME-INR: INR: 3.4 — AB (ref 0.80–1.20)

## 2023-01-11 ENCOUNTER — Encounter: Payer: Self-pay | Admitting: Oncology

## 2023-01-17 LAB — PROTIME-INR: INR: 3.5 — AB (ref 0.80–1.20)

## 2023-01-18 ENCOUNTER — Encounter: Payer: Self-pay | Admitting: *Deleted

## 2023-01-18 ENCOUNTER — Encounter: Payer: Self-pay | Admitting: Oncology

## 2023-01-18 DIAGNOSIS — G894 Chronic pain syndrome: Secondary | ICD-10-CM | POA: Diagnosis not present

## 2023-01-18 DIAGNOSIS — G890 Central pain syndrome: Secondary | ICD-10-CM | POA: Diagnosis not present

## 2023-01-18 DIAGNOSIS — Z79891 Long term (current) use of opiate analgesic: Secondary | ICD-10-CM | POA: Diagnosis not present

## 2023-01-18 DIAGNOSIS — M62830 Muscle spasm of back: Secondary | ICD-10-CM | POA: Diagnosis not present

## 2023-01-18 NOTE — Telephone Encounter (Signed)
Pt walked in today to sign paperwork. Paper are placed in provider color folder at front.

## 2023-01-19 ENCOUNTER — Other Ambulatory Visit: Payer: Self-pay | Admitting: Nurse Practitioner

## 2023-01-19 DIAGNOSIS — F419 Anxiety disorder, unspecified: Secondary | ICD-10-CM

## 2023-01-19 MED ORDER — ALPRAZOLAM 0.5 MG PO TABS
ORAL_TABLET | ORAL | 2 refills | Status: DC
Start: 2023-01-19 — End: 2023-05-04

## 2023-01-20 ENCOUNTER — Other Ambulatory Visit: Payer: Self-pay | Admitting: Oncology

## 2023-01-23 NOTE — Telephone Encounter (Signed)
Protime-INR Order: [id number] Status: Final result     Visible to patient: Yes (not seen)     Next appt: 03/23/2023 at 08:20 AM in Family Medicine [name], NP)   0 Result Notes          Component Ref Range & Units 6 d ago (01/17/23) 13 d ago (01/10/23) 2 wk ago (01/03/23) 3 wk ago (12/27/22) 1 mo ago (12/20/22) 1 mo ago (12/20/22) 1 mo ago (12/13/22)  INR 0.80 - 1.20 3.50 Abnormal  3.40 Abnormal  3.40 Abnormal  3.10 Abnormal  3.00 Abnormal  R 2.20 Abnormal          Specimen Collected: 01/17/23 Last Resulted: 01/17/23 11:34

## 2023-01-24 LAB — PROTIME-INR: INR: 2.1 — AB (ref 0.80–1.20)

## 2023-01-25 ENCOUNTER — Encounter: Payer: Self-pay | Admitting: Oncology

## 2023-01-31 DIAGNOSIS — Z7901 Long term (current) use of anticoagulants: Secondary | ICD-10-CM | POA: Diagnosis not present

## 2023-01-31 DIAGNOSIS — D6861 Antiphospholipid syndrome: Secondary | ICD-10-CM | POA: Diagnosis not present

## 2023-01-31 LAB — PROTIME-INR: INR: 3.5 — AB (ref 0.80–1.20)

## 2023-02-01 ENCOUNTER — Encounter: Payer: Self-pay | Admitting: Oncology

## 2023-02-02 ENCOUNTER — Encounter: Payer: Self-pay | Admitting: *Deleted

## 2023-02-03 ENCOUNTER — Other Ambulatory Visit: Payer: Self-pay | Admitting: Neurology

## 2023-02-03 DIAGNOSIS — G43711 Chronic migraine without aura, intractable, with status migrainosus: Secondary | ICD-10-CM

## 2023-02-07 LAB — PROTIME-INR: INR: 2.2 — AB (ref 0.80–1.20)

## 2023-02-08 ENCOUNTER — Encounter: Payer: Self-pay | Admitting: Oncology

## 2023-02-10 ENCOUNTER — Other Ambulatory Visit: Payer: Self-pay | Admitting: Nurse Practitioner

## 2023-02-10 DIAGNOSIS — F419 Anxiety disorder, unspecified: Secondary | ICD-10-CM

## 2023-02-14 ENCOUNTER — Encounter: Payer: Self-pay | Admitting: Oncology

## 2023-02-14 LAB — PROTIME-INR: INR: 2.9 — AB (ref 0.80–1.20)

## 2023-02-15 DIAGNOSIS — G894 Chronic pain syndrome: Secondary | ICD-10-CM | POA: Diagnosis not present

## 2023-02-15 DIAGNOSIS — Z79891 Long term (current) use of opiate analgesic: Secondary | ICD-10-CM | POA: Diagnosis not present

## 2023-02-15 DIAGNOSIS — M62830 Muscle spasm of back: Secondary | ICD-10-CM | POA: Diagnosis not present

## 2023-02-15 DIAGNOSIS — G890 Central pain syndrome: Secondary | ICD-10-CM | POA: Diagnosis not present

## 2023-02-24 ENCOUNTER — Telehealth: Payer: Self-pay | Admitting: Nurse Practitioner

## 2023-02-24 DIAGNOSIS — F419 Anxiety disorder, unspecified: Secondary | ICD-10-CM

## 2023-02-24 MED ORDER — SERTRALINE HCL 100 MG PO TABS
150.0000 mg | ORAL_TABLET | Freq: Every day | ORAL | 3 refills | Status: DC
Start: 2023-02-24 — End: 2024-05-09

## 2023-02-24 NOTE — Telephone Encounter (Signed)
Prescription Request  02/24/2023  LOV: 10/28/2022  What is the name of the medication or equipment? sertraline (ZOLOFT) 100 MG tablet   Have you contacted your pharmacy to request a refill? Yes   Which pharmacy would you like this sent to?  [facility] - [place], [place] - [address] [place] [address] Phone: [phone] Fax: [phone]    Patient notified that their request is being sent to the clinical staff for review and that they should receive a response within 2 business days.   Please advise at Mobile [phone] (mobile)

## 2023-02-24 NOTE — Telephone Encounter (Signed)
Refill has been sent.  °

## 2023-02-28 ENCOUNTER — Other Ambulatory Visit: Payer: Self-pay

## 2023-02-28 DIAGNOSIS — F419 Anxiety disorder, unspecified: Secondary | ICD-10-CM

## 2023-02-28 MED ORDER — BUPROPION HCL ER (XL) 300 MG PO TB24
300.0000 mg | ORAL_TABLET | Freq: Every day | ORAL | 3 refills | Status: DC
Start: 2023-02-28 — End: 2024-05-09

## 2023-02-28 NOTE — Telephone Encounter (Signed)
Received e fax for refill on wellbutrin for [facility]. Refill has been sent

## 2023-03-01 ENCOUNTER — Encounter: Payer: Self-pay | Admitting: Oncology

## 2023-03-01 ENCOUNTER — Encounter: Payer: Self-pay | Admitting: *Deleted

## 2023-03-07 LAB — PROTIME-INR: INR: 2.5 — AB (ref 0.80–1.20)

## 2023-03-08 ENCOUNTER — Encounter: Payer: Self-pay | Admitting: *Deleted

## 2023-03-08 ENCOUNTER — Encounter: Payer: Self-pay | Admitting: Oncology

## 2023-03-10 LAB — HM DIABETES EYE EXAM

## 2023-03-14 ENCOUNTER — Ambulatory Visit: Payer: Medicare Other | Admitting: Nurse Practitioner

## 2023-03-14 LAB — PROTIME-INR: INR: 2.3 — AB (ref 0.80–1.20)

## 2023-03-16 ENCOUNTER — Encounter: Payer: Self-pay | Admitting: Oncology

## 2023-03-16 ENCOUNTER — Encounter: Payer: Self-pay | Admitting: *Deleted

## 2023-03-16 NOTE — Telephone Encounter (Signed)
If she wants to go down to 10 mg every day we will just have to see what happens to her inr and go from there

## 2023-03-22 ENCOUNTER — Encounter: Payer: Self-pay | Admitting: Oncology

## 2023-03-22 ENCOUNTER — Encounter: Payer: Self-pay | Admitting: *Deleted

## 2023-03-22 LAB — PROTIME-INR: INR: 3.2 — AB (ref 0.80–1.20)

## 2023-03-22 NOTE — Progress Notes (Unsigned)
[name], NP-C Phone: [phone]  Amanda Vasquez is a 52 y.o. female who presents today for follow up.   Joint Pain- She has been having worsening joint pains in her hands, wrists, elbows, knees, and feet since 03/15/2023. She has been evaluated by her pain management doctor who recommended she have RA testing completed. Her pain is worse first thing in the morning and improves throughout the day. She feels that her joints are swollen and she has decreased strength in her hands from the pain.   DIABETES- Managed by Endocrinology Disease Monitoring: Blood Sugar ranges- 100-120s Polyuria/phagia/dipsia- No      Optho- UTD Medications: Compliance- Metformin, Lantus, Farxiga Hypoglycemic symptoms- No Lab Results  Component Value Date   HGBA1C 9.3 (A) 12/29/2022   HYPERLIPIDEMIA Symptoms Chest pain on exertion:  No   Leg claudication:   No Medications: Compliance- Lipitor Right upper quadrant pain- No  Muscle aches- No Lipid Panel     Component Value Date/Time   CHOL 167 09/13/2022 1435   CHOL 113 10/21/2020 1533   TRIG 66.0 09/13/2022 1435   HDL 91.90 09/13/2022 1435   HDL 41 10/21/2020 1533   CHOLHDL 2 09/13/2022 1435   VLDL 13.2 09/13/2022 1435   LDLCALC 61 09/13/2022 1435   LDLCALC 41 10/21/2020 1533   LDLDIRECT 80.0 09/19/2018 1139   LABVLDL 31 10/21/2020 1533     Social History   Tobacco Use  Smoking Status Former   Current packs/day: 0.00   Average packs/day: 0.3 packs/day for 24.2 years (6.1 ttl pk-yrs)   Types: Cigarettes   Start date: 06/08/1986   Quit date: 08/22/2010   Years since quitting: 12.5  Smokeless Tobacco Never    Current Outpatient Medications on File Prior to Visit  Medication Sig Dispense Refill   ALPRAZolam (XANAX) 0.5 MG tablet TAKE ONE TABLET BY MOUTH EVERY DAY AS NEEDED FOR ANXIETY 30 tablet 2   atorvastatin (LIPITOR) 40 MG tablet TAKE 1 TABLET BY MOUTH DAILY AT 6 IN THEEVENING. 90 tablet 3   botulinum toxin Type A (BOTOX) 200 units  injection Provider to inject 155 units into the muscles of the head and neck every 3 months. Discard remainder. 1 each 0   buPROPion (WELLBUTRIN XL) 300 MG 24 hr tablet Take 1 tablet (300 mg total) by mouth daily. 90 tablet 3   calcium carbonate (OSCAL) 1500 (600 Ca) MG TABS tablet Take 1,500 mg by mouth daily with breakfast.     Cetirizine HCl 10 MG CAPS Take 1 capsule (10 mg total) by mouth daily as needed. Take by mouth. 30 capsule    Cholecalciferol (VITAMIN D) 50 MCG (2000 UT) CAPS Take 2,000 Units by mouth daily.     CYANOCOBALAMIN PO Take by mouth daily. 1 gummy     cyclobenzaprine (FLEXERIL) 10 MG tablet Take 1 tablet (10 mg total) by mouth at bedtime. 90 tablet 3   dapagliflozin propanediol (FARXIGA) 10 MG TABS tablet Take 1 tablet (10 mg total) by mouth daily before breakfast. 90 tablet 3   enoxaparin (LOVENOX) 80 MG/0.8ML injection Inject 0.8 mLs (80 mg total) into the skin every 12 (twelve) hours. Pt has surgery and after she has to be on coumadin and lovenox until INR is 2 and then stop the lovenox. INR1.2 needs more injection 16 mL 0   Galcanezumab-gnlm (EMGALITY) 120 MG/ML SOAJ Inject 120 mg into the skin every 30 (thirty) days.     insulin glargine (LANTUS SOLOSTAR) 100 UNIT/ML Solostar Pen Inject 12-16 Units into  the skin at bedtime. 15 mL 3   Insulin Pen Needle 32G X 4 MM MISC Use 1x a day 100 each 3   LYRICA 75 MG capsule Take 1 capsule twice a day by oral route.     metFORMIN (GLUCOPHAGE) 1000 MG tablet TAKE ONE (1) TABLET BY MOUTH TWO TIMES PER DAY WITH MEALS 180 tablet 3   morphine (MS CONTIN) 60 MG 12 hr tablet Take 60 mg by mouth every 12 (twelve) hours.     Multiple Vitamin (MULTIVITAMIN) tablet Take 1 tablet by mouth daily.     nitroGLYCERIN (NITROSTAT) 0.4 MG SL tablet Place 1 tablet (0.4 mg total) under the tongue every 5 (five) minutes as needed for chest pain. 25 tablet 3   NON FORMULARY CBD Gummies     oxyCODONE (OXY IR/ROXICODONE) 5 MG immediate release tablet Take 1  tablet (5 mg total) by mouth every 4 (four) hours as needed for severe pain. Per pain clinic Dr Vear Clock 30 tablet    Polyethylene Glycol 3350 (MIRALAX PO) Take 17 g by mouth daily as needed.     promethazine (PHENERGAN) 25 MG tablet Take 1 tablet (25 mg total) by mouth 2 (two) times daily as needed for nausea or vomiting. 60 tablet 5   Rimegepant Sulfate (NURTEC) 75 MG TBDP Take 75 mg by mouth daily as needed. For migraines. Take as close to onset of migraine as possible. One daily maximum. 16 tablet 6   sertraline (ZOLOFT) 100 MG tablet Take 1.5 tablets (150 mg total) by mouth daily. TAKE 150 mg qd 135 tablet 3   traZODone (DESYREL) 100 MG tablet TAKE 1 TABLET BY MOUTH AT BEDTIME AS NEEDED FOR SLEEP. 90 tablet 3   valACYclovir (VALTREX) 1000 MG tablet Take 1 tablet (1,000 mg total) by mouth 2 (two) times daily. Bid with outbreak with food x 3-7 days 60 tablet 2   warfarin (COUMADIN) 10 MG tablet TAKE ONE TABLET BY MOUTH EVERY DAY 30 tablet 3   warfarin (COUMADIN) 10 MG tablet TAKE ONE TABLET BY MOUTH AS DIRECTED PT TO HAVE 10 MG DAILY FOR 6 DAYS AND ON7TH DAY TAKE 1/2 TABLET.  REPEAT EACH WEEK UNTIL A CHANGE HAS BEEN MADE 29 tablet 3   No current facility-administered medications on file prior to visit.     ROS see history of present illness  Objective  Physical Exam Vitals:   03/23/23 0815  BP: 118/60  Pulse: 91  Temp: 98.4 F (36.9 C)  SpO2: 97%    BP Readings from Last 3 Encounters:  03/23/23 118/60  12/29/22 108/60  12/13/22 110/60   Wt Readings from Last 3 Encounters:  03/23/23 235 lb (106.6 kg)  12/29/22 234 lb 6.4 oz (106.3 kg)  12/13/22 234 lb (106.1 kg)    Physical Exam Constitutional:      General: She is not in acute distress.    Appearance: Normal appearance.  HENT:     Head: Normocephalic.  Cardiovascular:     Rate and Rhythm: Normal rate and regular rhythm.     Heart sounds: Normal heart sounds.  Pulmonary:     Effort: Pulmonary effort is normal.      Breath sounds: Normal breath sounds.  Musculoskeletal:     Right wrist: Normal.     Left wrist: Normal.     Right hand: No swelling or tenderness. Decreased range of motion (limited by pain). Decreased strength.     Left hand: No swelling or tenderness. Decreased range of motion (limited  by pain). Decreased strength.     Right knee: No swelling. Decreased range of motion (limited by pain). No tenderness.     Left knee: No swelling. Decreased range of motion (limited by pain). No tenderness.  Skin:    General: Skin is warm and dry.  Neurological:     General: No focal deficit present.     Mental Status: She is alert.  Psychiatric:        Mood and Affect: Mood normal.        Behavior: Behavior normal.    Assessment/Plan: Please see individual problem list.  Arthralgia of multiple sites Assessment & Plan: Worsening joint pain- multiple sites. Range of motion and strength limited by pain. She does see pain management who recommended RA testing. Lab work as outlined, will contact patient with results. Follow up with pain management as scheduled, will refer to Rheumatology if necessary.   Orders: -     ANA,IFA RA Diag Pnl w/rflx Tit/Patn  Type 2 diabetes mellitus without complication, without long-term current use of insulin ([name]) Assessment & Plan: Chronic. Uncontrolled. Managed by Endocrinology. On Metformin and Marcelline Deist, started on Lantus in May. Continue. Last A1c in May 2024- 9.3. Encouraged healthy diet and exercise. Follow up with Endo in September as scheduled.   Orders: -     Comprehensive metabolic panel  Hyperlipidemia associated with type 2 diabetes mellitus ([name]) Assessment & Plan: Chronic. Stable on Lipitor 40 mg daily. Continue. Will check lipid panel. Encouraged healthy diet and exercise.   Orders: -     Lipid panel  Anti-phospholipid antibody syndrome ([name]) Assessment & Plan: Chronic. On Warfarin for anticoagulation therapy. Checks INR weekly. Denies problems with  bleeding today. Continue follow up with Hematology.   Orders: -     CBC with Differential/Platelet  Anxiety and depression Assessment & Plan: Chronic. Recently established with Psychiatry and therapist. Zoloft was increased to 200 mg daily. She will continue her Xanax as needed, Wellbutrin XL, and Trazodone. Symptoms well managed at this time. Follow up with Psychiatry and therapist as scheduled. Will continue to monitor. Encouraged to contact if worsening symptoms, unusual behavior changes or suicidal thoughts occur.   Orders: -     TSH -     T4, free  Chronic migraine without aura, with intractable migraine, so stated, with status migrainosus Assessment & Plan: Chronic issue. Stable on Inderal for prophylaxis. Continue. Refills sent.   Orders: -     Propranolol HCl ER; Take 1 capsule (80 mg total) by mouth daily.  Dispense: 90 capsule; Refill: 3   Return in about 6 months (around 09/23/2023) for Follow up.   [name], NP-C [facility] [employer]

## 2023-03-23 ENCOUNTER — Encounter: Payer: Self-pay | Admitting: Nurse Practitioner

## 2023-03-23 ENCOUNTER — Ambulatory Visit: Payer: Medicare Other | Admitting: Nurse Practitioner

## 2023-03-23 VITALS — BP 118/60 | HR 91 | Temp 98.4°F | Ht 65.0 in | Wt 235.0 lb

## 2023-03-23 DIAGNOSIS — Z7984 Long term (current) use of oral hypoglycemic drugs: Secondary | ICD-10-CM

## 2023-03-23 DIAGNOSIS — E1169 Type 2 diabetes mellitus with other specified complication: Secondary | ICD-10-CM | POA: Diagnosis not present

## 2023-03-23 DIAGNOSIS — D6861 Antiphospholipid syndrome: Secondary | ICD-10-CM | POA: Diagnosis not present

## 2023-03-23 DIAGNOSIS — E785 Hyperlipidemia, unspecified: Secondary | ICD-10-CM

## 2023-03-23 DIAGNOSIS — E119 Type 2 diabetes mellitus without complications: Secondary | ICD-10-CM

## 2023-03-23 DIAGNOSIS — M2550 Pain in unspecified joint: Secondary | ICD-10-CM | POA: Diagnosis not present

## 2023-03-23 DIAGNOSIS — F32A Depression, unspecified: Secondary | ICD-10-CM | POA: Diagnosis not present

## 2023-03-23 DIAGNOSIS — G43711 Chronic migraine without aura, intractable, with status migrainosus: Secondary | ICD-10-CM

## 2023-03-23 DIAGNOSIS — F419 Anxiety disorder, unspecified: Secondary | ICD-10-CM | POA: Diagnosis not present

## 2023-03-23 LAB — CBC WITH DIFFERENTIAL/PLATELET
Basophils Absolute: 0.1 10*3/uL (ref 0.0–0.1)
Basophils Relative: 1.2 % (ref 0.0–3.0)
Eosinophils Absolute: 0.4 10*3/uL (ref 0.0–0.7)
Eosinophils Relative: 8 % — ABNORMAL HIGH (ref 0.0–5.0)
HCT: 36.4 % (ref 36.0–46.0)
Hemoglobin: 11.9 g/dL — ABNORMAL LOW (ref 12.0–15.0)
Lymphocytes Relative: 37 % (ref 12.0–46.0)
Lymphs Abs: 1.9 10*3/uL (ref 0.7–4.0)
MCHC: 32.7 g/dL (ref 30.0–36.0)
MCV: 85.2 fl (ref 78.0–100.0)
Monocytes Absolute: 0.4 10*3/uL (ref 0.1–1.0)
Monocytes Relative: 8 % (ref 3.0–12.0)
Neutro Abs: 2.3 10*3/uL (ref 1.4–7.7)
Neutrophils Relative %: 45.8 % (ref 43.0–77.0)
Platelets: 234 10*3/uL (ref 150.0–400.0)
RBC: 4.27 Mil/uL (ref 3.87–5.11)
RDW: 17 % — ABNORMAL HIGH (ref 11.5–15.5)
WBC: 5.1 10*3/uL (ref 4.0–10.5)

## 2023-03-23 LAB — COMPREHENSIVE METABOLIC PANEL
ALT: 20 U/L (ref 0–35)
AST: 24 U/L (ref 0–37)
Albumin: 4 g/dL (ref 3.5–5.2)
Alkaline Phosphatase: 82 U/L (ref 39–117)
BUN: 17 mg/dL (ref 6–23)
CO2: 26 mEq/L (ref 19–32)
Calcium: 9.2 mg/dL (ref 8.4–10.5)
Chloride: 105 mEq/L (ref 96–112)
Creatinine, Ser: 0.8 mg/dL (ref 0.40–1.20)
GFR: 84.35 mL/min (ref >60.00)
Glucose, Bld: 99 mg/dL (ref 70–99)
Potassium: 4.1 mEq/L (ref 3.5–5.1)
Sodium: 139 mEq/L (ref 135–145)
Total Bilirubin: 0.4 mg/dL (ref 0.2–1.2)
Total Protein: 6.6 g/dL (ref 6.0–8.3)

## 2023-03-23 LAB — LIPID PANEL
Cholesterol: 120 mg/dL (ref 0–200)
HDL: 64.8 mg/dL (ref >39.00)
LDL Cholesterol: 30 mg/dL (ref 0–99)
NonHDL: 55.17
Total CHOL/HDL Ratio: 2
Triglycerides: 124 mg/dL (ref 0.0–149.0)
VLDL: 24.8 mg/dL (ref 0.0–40.0)

## 2023-03-23 LAB — TSH: TSH: 2.43 u[IU]/mL (ref 0.35–5.50)

## 2023-03-23 LAB — T4, FREE: Free T4: 0.72 ng/dL (ref 0.60–1.60)

## 2023-03-23 MED ORDER — PROPRANOLOL HCL ER 80 MG PO CP24
80.0000 mg | ORAL_CAPSULE | Freq: Every day | ORAL | 3 refills | Status: DC
Start: 2023-03-23 — End: 2024-01-12

## 2023-03-23 NOTE — Assessment & Plan Note (Addendum)
Chronic. Uncontrolled. Managed by Endocrinology. On Metformin and Marcelline Deist, started on Lantus in May. Continue. Last A1c in May 2024- 9.3. Encouraged healthy diet and exercise. Follow up with Endo in September as scheduled.

## 2023-03-23 NOTE — Assessment & Plan Note (Signed)
Chronic. Recently established with Psychiatry and therapist. Zoloft was increased to 200 mg daily. She will continue her Xanax as needed, Wellbutrin XL, and Trazodone. Symptoms well managed at this time. Follow up with Psychiatry and therapist as scheduled. Will continue to monitor. Encouraged to contact if worsening symptoms, unusual behavior changes or suicidal thoughts occur.

## 2023-03-23 NOTE — Assessment & Plan Note (Signed)
Chronic. On Warfarin for anticoagulation therapy. Checks INR weekly. Denies problems with bleeding today. Continue follow up with Hematology.

## 2023-03-23 NOTE — Assessment & Plan Note (Signed)
Worsening joint pain- multiple sites. Range of motion and strength limited by pain. She does see pain management who recommended RA testing. Lab work as outlined, will contact patient with results. Follow up with pain management as scheduled, will refer to Rheumatology if necessary.

## 2023-03-23 NOTE — Assessment & Plan Note (Signed)
Chronic issue. Stable on Inderal for prophylaxis. Continue. Refills sent.

## 2023-03-23 NOTE — Assessment & Plan Note (Signed)
Chronic. Stable on Lipitor 40 mg daily. Continue. Will check lipid panel. Encouraged healthy diet and exercise.

## 2023-03-27 ENCOUNTER — Encounter: Payer: Self-pay | Admitting: Nurse Practitioner

## 2023-03-30 ENCOUNTER — Encounter: Payer: Self-pay | Admitting: *Deleted

## 2023-03-30 ENCOUNTER — Encounter: Payer: Self-pay | Admitting: Oncology

## 2023-04-05 ENCOUNTER — Encounter: Payer: Self-pay | Admitting: Oncology

## 2023-04-11 LAB — PROTIME-INR: INR: 2.2 — AB (ref 0.80–1.20)

## 2023-04-12 ENCOUNTER — Encounter: Payer: Self-pay | Admitting: Oncology

## 2023-04-13 ENCOUNTER — Encounter: Payer: Self-pay | Admitting: *Deleted

## 2023-04-18 ENCOUNTER — Encounter: Payer: Self-pay | Admitting: *Deleted

## 2023-04-18 ENCOUNTER — Encounter: Payer: Self-pay | Admitting: Oncology

## 2023-04-18 LAB — PROTIME-INR: INR: 3.5 — AB (ref 0.80–1.20)

## 2023-04-19 ENCOUNTER — Telehealth: Payer: Self-pay | Admitting: Nurse Practitioner

## 2023-04-19 NOTE — Telephone Encounter (Signed)
Copied from CRM [id number]. Topic: Medicare AWV >> Apr 19, 2023 10:50 AM Payton Doughty wrote: Reason for CRM: LM 04/19/2023 to schedule AWV   Verlee Rossetti; Care Guide Ambulatory Clinical Support [place] [facility] Direct Dial: [phone]

## 2023-04-25 LAB — PROTIME-INR: INR: 2.2 — AB (ref 0.80–1.20)

## 2023-04-26 ENCOUNTER — Encounter: Payer: Self-pay | Admitting: Oncology

## 2023-04-26 ENCOUNTER — Encounter: Payer: Self-pay | Admitting: *Deleted

## 2023-04-27 ENCOUNTER — Ambulatory Visit: Payer: Medicare Other | Admitting: Internal Medicine

## 2023-04-27 ENCOUNTER — Encounter: Payer: Self-pay | Admitting: Internal Medicine

## 2023-04-27 VITALS — BP 125/75 | HR 96 | Ht 65.0 in | Wt 229.4 lb

## 2023-04-27 DIAGNOSIS — E785 Hyperlipidemia, unspecified: Secondary | ICD-10-CM | POA: Diagnosis not present

## 2023-04-27 DIAGNOSIS — Z794 Long term (current) use of insulin: Secondary | ICD-10-CM

## 2023-04-27 DIAGNOSIS — E1165 Type 2 diabetes mellitus with hyperglycemia: Secondary | ICD-10-CM | POA: Diagnosis not present

## 2023-04-27 DIAGNOSIS — Z6836 Body mass index (BMI) 36.0-36.9, adult: Secondary | ICD-10-CM

## 2023-04-27 DIAGNOSIS — Z7984 Long term (current) use of oral hypoglycemic drugs: Secondary | ICD-10-CM

## 2023-04-27 LAB — POCT GLYCOSYLATED HEMOGLOBIN (HGB A1C): Hemoglobin A1C: 7.6 % — AB (ref 4.0–5.6)

## 2023-04-27 LAB — MICROALBUMIN / CREATININE URINE RATIO
Creatinine,U: 79.4 mg/dL
Microalb Creat Ratio: 0.9 mg/g (ref 0.0–30.0)
Microalb, Ur: <0.7 mg/dL (ref 0.0–1.9)

## 2023-04-27 MED ORDER — FREESTYLE LIBRE 3 SENSOR MISC: 1.0000 | 3 refills | Status: DC

## 2023-04-27 NOTE — Patient Instructions (Addendum)
Please continue: - Metformin 1000 mg 2x a day, with meals - Farxiga 10 mg daily in a.m. - Lantus 12 units at bedtime  Please return in 3-4 months with your sugar log.
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
## 2023-05-02 ENCOUNTER — Encounter: Payer: Self-pay | Admitting: Oncology

## 2023-05-02 ENCOUNTER — Encounter: Payer: Self-pay | Admitting: Internal Medicine

## 2023-05-02 DIAGNOSIS — E1165 Type 2 diabetes mellitus with hyperglycemia: Secondary | ICD-10-CM

## 2023-05-03 ENCOUNTER — Other Ambulatory Visit: Payer: Self-pay | Admitting: Nurse Practitioner

## 2023-05-03 DIAGNOSIS — F32A Depression, unspecified: Secondary | ICD-10-CM

## 2023-05-03 MED ORDER — FREESTYLE LIBRE 3 PLUS SENSOR MISC: 1.0000 | 3 refills | Status: DC

## 2023-05-04 ENCOUNTER — Telehealth: Payer: Self-pay | Admitting: Nurse Practitioner

## 2023-05-04 NOTE — Telephone Encounter (Signed)
Copied from CRM [id number]. Topic: Medicare AWV >> May 04, 2023 10:12 AM Payton Doughty wrote: Reason for CRM: LM 05/04/2023 to schedule AWV   Verlee Rossetti; Care Guide Ambulatory Clinical Support [place] [facility] Direct Dial: [phone]

## 2023-05-15 ENCOUNTER — Telehealth: Payer: Self-pay

## 2023-05-15 ENCOUNTER — Other Ambulatory Visit: Payer: Self-pay

## 2023-05-15 DIAGNOSIS — Z1231 Encounter for screening mammogram for malignant neoplasm of breast: Secondary | ICD-10-CM

## 2023-05-15 LAB — PROTIME-INR: INR: 3.1 — AB (ref 0.80–1.20)

## 2023-05-15 NOTE — Telephone Encounter (Signed)
Left detailed VM informing pt that we received an Over due mammogram notification and that she can call [place] at [phone] to schedule I will aslo send mychart msg informing '   Mychart msg sent

## 2023-05-22 LAB — PROTIME-INR: INR: 3.4 — AB (ref 0.80–1.20)

## 2023-05-30 ENCOUNTER — Encounter: Payer: Self-pay | Admitting: Oncology

## 2023-05-30 ENCOUNTER — Encounter: Payer: Self-pay | Admitting: *Deleted

## 2023-05-30 LAB — PROTIME-INR: INR: 3.7 — AB (ref 0.80–1.20)

## 2023-05-30 MED ORDER — DEXCOM G7 SENSOR MISC
3 refills | Status: DC
Start: 2023-05-30 — End: 2024-01-18

## 2023-05-30 NOTE — Telephone Encounter (Signed)
Do you prefer Libre 2 or Dexcom ? Since the [place]irth 3 and 3 Plus are on back order.

## 2023-05-30 NOTE — Addendum Note (Signed)
Addended by: Pollie Meyer on: 05/30/2023 01:17 PM   Modules accepted: Orders

## 2023-05-31 ENCOUNTER — Encounter: Payer: Self-pay | Admitting: *Deleted

## 2023-06-05 ENCOUNTER — Telehealth: Payer: Self-pay | Admitting: Nurse Practitioner

## 2023-06-05 NOTE — Telephone Encounter (Signed)
Copied from CRM [id number]. Topic: Medicare AWV >> Jun 05, 2023 10:15 AM Payton Doughty wrote: Reason for CRM: Called LVM 06/05/2023 to schedule Annual Wellness Visit  Verlee Rossetti; Care Guide Ambulatory Clinical Support [place] [facility] Direct Dial: [phone]

## 2023-06-06 LAB — PROTIME-INR: INR: 2.5 — AB (ref 0.80–1.20)

## 2023-06-07 ENCOUNTER — Encounter: Payer: Self-pay | Admitting: Oncology

## 2023-06-09 ENCOUNTER — Other Ambulatory Visit: Payer: Self-pay | Admitting: Oncology

## 2023-06-09 NOTE — Telephone Encounter (Signed)
Component Ref Range & Units 3 d ago (06/06/23) 10 d ago (05/30/23) 2 wk ago (05/22/23) 3 wk ago (05/15/23) 1 mo ago (04/25/23) 1 mo ago (04/18/23) 1 mo ago (04/11/23)  INR 0.80 - 1.20 2.50 Abnormal  3.70 Abnormal  3.40 Abnormal  3.10 Abnormal  2.20 Abnormal  3.50 Abnormal  2.20

## 2023-06-14 LAB — PROTIME-INR: INR: 1.5 — AB (ref 0.80–1.20)

## 2023-06-15 ENCOUNTER — Encounter: Payer: Self-pay | Admitting: Oncology

## 2023-06-18 ENCOUNTER — Encounter: Payer: Self-pay | Admitting: *Deleted

## 2023-06-20 LAB — PROTIME-INR: INR: 3.1 — AB (ref 0.80–1.20)

## 2023-06-21 ENCOUNTER — Encounter: Payer: Self-pay | Admitting: Oncology

## 2023-06-21 ENCOUNTER — Encounter: Payer: Self-pay | Admitting: *Deleted

## 2023-06-27 LAB — PROTIME-INR: INR: 2.7 — AB (ref 0.80–1.20)

## 2023-06-28 ENCOUNTER — Encounter: Payer: Self-pay | Admitting: *Deleted

## 2023-06-28 ENCOUNTER — Encounter: Payer: Self-pay | Admitting: Oncology

## 2023-06-28 ENCOUNTER — Other Ambulatory Visit: Payer: Self-pay | Admitting: Medical Genetics

## 2023-06-28 DIAGNOSIS — Z006 Encounter for examination for normal comparison and control in clinical research program: Secondary | ICD-10-CM

## 2023-07-03 ENCOUNTER — Other Ambulatory Visit: Payer: Medicare Other

## 2023-07-04 ENCOUNTER — Other Ambulatory Visit: Payer: Self-pay | Admitting: Nurse Practitioner

## 2023-07-04 DIAGNOSIS — B001 Herpesviral vesicular dermatitis: Secondary | ICD-10-CM

## 2023-07-04 LAB — PROTIME-INR: INR: 2.2 — AB (ref 0.80–1.20)

## 2023-07-05 ENCOUNTER — Other Ambulatory Visit: Payer: Medicare Other

## 2023-07-05 ENCOUNTER — Other Ambulatory Visit: Payer: Medicare Other | Attending: Medical Genetics

## 2023-07-05 ENCOUNTER — Encounter: Payer: Self-pay | Admitting: Oncology

## 2023-07-05 ENCOUNTER — Encounter: Payer: Self-pay | Admitting: *Deleted

## 2023-07-13 LAB — PROTIME-INR: INR: 2.7 — AB (ref 0.80–1.20)

## 2023-07-14 ENCOUNTER — Encounter: Payer: Self-pay | Admitting: Oncology

## 2023-07-14 ENCOUNTER — Encounter: Payer: Self-pay | Admitting: *Deleted

## 2023-07-18 LAB — PROTIME-INR: INR: 3.1 — AB (ref 0.80–1.20)

## 2023-07-24 ENCOUNTER — Encounter: Payer: Self-pay | Admitting: *Deleted

## 2023-07-24 ENCOUNTER — Encounter: Payer: Self-pay | Admitting: Oncology

## 2023-07-25 LAB — PROTIME-INR: INR: 2.9 — AB (ref 0.80–1.20)

## 2023-07-26 ENCOUNTER — Encounter: Payer: Self-pay | Admitting: Oncology

## 2023-07-27 ENCOUNTER — Ambulatory Visit: Payer: Medicare Other | Admitting: Internal Medicine
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
## 2023-07-28 ENCOUNTER — Encounter: Payer: Self-pay | Admitting: *Deleted

## 2023-08-01 ENCOUNTER — Ambulatory Visit
Admission: RE | Admit: 2023-08-01 | Discharge: 2023-08-01 | Disposition: A | Discharge status: Home / Self Care | Payer: Self-pay | Source: Ambulatory Visit | Attending: Nurse Practitioner

## 2023-08-01 ENCOUNTER — Other Ambulatory Visit: Payer: Self-pay | Admitting: *Deleted

## 2023-08-01 ENCOUNTER — Ambulatory Visit: Payer: Medicare Other | Admitting: Internal Medicine

## 2023-08-01 ENCOUNTER — Encounter: Payer: Self-pay | Admitting: Internal Medicine

## 2023-08-01 VITALS — BP 136/70 | HR 87 | Ht 65.0 in | Wt 236.2 lb

## 2023-08-01 DIAGNOSIS — Z794 Long term (current) use of insulin: Secondary | ICD-10-CM

## 2023-08-01 DIAGNOSIS — E785 Hyperlipidemia, unspecified: Secondary | ICD-10-CM | POA: Diagnosis not present

## 2023-08-01 DIAGNOSIS — Z7984 Long term (current) use of oral hypoglycemic drugs: Secondary | ICD-10-CM

## 2023-08-01 DIAGNOSIS — Z1231 Encounter for screening mammogram for malignant neoplasm of breast: Secondary | ICD-10-CM

## 2023-08-01 DIAGNOSIS — E66812 Obesity, class 2: Secondary | ICD-10-CM | POA: Diagnosis not present

## 2023-08-01 DIAGNOSIS — Z6836 Body mass index (BMI) 36.0-36.9, adult: Secondary | ICD-10-CM | POA: Diagnosis not present

## 2023-08-01 DIAGNOSIS — E1165 Type 2 diabetes mellitus with hyperglycemia: Secondary | ICD-10-CM | POA: Diagnosis not present

## 2023-08-01 LAB — POCT GLYCOSYLATED HEMOGLOBIN (HGB A1C): Hemoglobin A1C: 7.6 % — AB (ref 4.0–5.6)

## 2023-08-01 LAB — PROTIME-INR: INR: 2.6 — AB (ref 0.80–1.20)

## 2023-08-01 MED ORDER — LANTUS SOLOSTAR 100 UNIT/ML ~~LOC~~ SOPN: 20.0000 [IU] | PEN_INJECTOR | Freq: Every day | SUBCUTANEOUS | 3 refills | Status: DC

## 2023-08-01 NOTE — Addendum Note (Signed)
Addended by: Pollie Meyer on: 08/01/2023 04:36 PM   Modules accepted: Orders
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
## 2023-08-01 NOTE — Patient Instructions (Addendum)
Please continue: - Metformin 1000 mg 2x a day, with meals - Farxiga 10 mg daily in a.m.  Please increase: - Lantus 20 units at bedtime   Please return in 3-4 months.

## 2023-08-02 ENCOUNTER — Encounter: Payer: Self-pay | Admitting: *Deleted

## 2023-08-02 ENCOUNTER — Encounter: Payer: Self-pay | Admitting: Oncology

## 2023-08-08 LAB — PROTIME-INR: INR: 3.1 — AB (ref 0.80–1.20)

## 2023-08-09 ENCOUNTER — Encounter: Payer: Self-pay | Admitting: *Deleted

## 2023-08-09 ENCOUNTER — Encounter: Payer: Self-pay | Admitting: Nurse Practitioner

## 2023-08-09 ENCOUNTER — Encounter: Payer: Self-pay | Admitting: Oncology

## 2023-08-09 NOTE — Telephone Encounter (Signed)
 Care team updated and letter sent for eye exam notes.

## 2023-08-15 LAB — PROTIME-INR: INR: 2.7 — AB (ref 0.80–1.20)

## 2023-08-17 ENCOUNTER — Encounter: Payer: Self-pay | Admitting: Oncology

## 2023-08-17 ENCOUNTER — Encounter: Payer: Self-pay | Admitting: *Deleted

## 2023-08-23 LAB — PROTIME-INR: INR: 1.7 — AB (ref 0.80–1.20)

## 2023-08-24 NOTE — Telephone Encounter (Signed)
 Error

## 2023-08-25 ENCOUNTER — Telehealth: Payer: Self-pay

## 2023-08-25 ENCOUNTER — Encounter: Payer: Self-pay | Admitting: Oncology

## 2023-08-25 ENCOUNTER — Encounter: Payer: Self-pay | Admitting: *Deleted

## 2023-08-25 NOTE — Telephone Encounter (Signed)
-----   Message from Tinnie KANDICE Dawn sent at 08/25/2023 10:01 AM EST ----- Please contact patient and see what dose of coumadin  she's taking and if she's had any recent known interactions. INR is not therapeutic so we may need to adjust her dose. ----- Message ----- From: Melba Grayce KANDICE Sent: 08/25/2023   8:37 AM EST To: Tinnie KANDICE Dawn, NP

## 2023-08-25 NOTE — Telephone Encounter (Signed)
 Spoke with patient, she is currently taking 10 mg 6 nights, then 5 mg on the other night. She denies any recent interactions but states she did eat Collard greens in the span of time. Advised I would relay this info back to Lauren for further advice.    After speaking with lauren, she advises patient to take 10 mg for the 7 days. Informed patient of the dose change and we will adjust dose as needed with her INR results in 2 weeks. Patient verbalized understanding.

## 2023-09-05 LAB — PROTIME-INR: INR: 4.1 — AB (ref 0.80–1.20)

## 2023-09-07 ENCOUNTER — Encounter: Payer: Self-pay | Admitting: Intensive Care

## 2023-09-07 ENCOUNTER — Emergency Department
Admission: EM | Admit: 2023-09-07 | Discharge: 2023-09-07 | Disposition: A | Discharge status: Home / Self Care | Payer: Medicare Other | Attending: Emergency Medicine | Admitting: Emergency Medicine

## 2023-09-07 ENCOUNTER — Other Ambulatory Visit: Payer: Self-pay

## 2023-09-07 ENCOUNTER — Emergency Department: Payer: Medicare Other

## 2023-09-07 DIAGNOSIS — Y9301 Activity, walking, marching and hiking: Secondary | ICD-10-CM | POA: Diagnosis not present

## 2023-09-07 DIAGNOSIS — S0003XA Contusion of scalp, initial encounter: Secondary | ICD-10-CM | POA: Insufficient documentation

## 2023-09-07 DIAGNOSIS — M79601 Pain in right arm: Secondary | ICD-10-CM | POA: Insufficient documentation

## 2023-09-07 DIAGNOSIS — W109XXA Fall (on) (from) unspecified stairs and steps, initial encounter: Secondary | ICD-10-CM | POA: Insufficient documentation

## 2023-09-07 DIAGNOSIS — M542 Cervicalgia: Secondary | ICD-10-CM | POA: Insufficient documentation

## 2023-09-07 DIAGNOSIS — M79604 Pain in right leg: Secondary | ICD-10-CM | POA: Diagnosis not present

## 2023-09-07 DIAGNOSIS — S0990XA Unspecified injury of head, initial encounter: Secondary | ICD-10-CM

## 2023-09-07 DIAGNOSIS — M545 Low back pain, unspecified: Secondary | ICD-10-CM | POA: Diagnosis not present

## 2023-09-07 NOTE — Discharge Instructions (Addendum)
You were seen in the emergency room today for evaluation after a fall. Fortunately your CT scan did not show evidence of a skull fracture or bleeding in your brain.  Please continue to take your medications as directed.  Return to the ER for any new or worsening symptoms.

## 2023-09-07 NOTE — ED Provider Notes (Signed)
[facility] Provider Note    Event Date/Time   First MD Initiated Contact with Patient 09/07/23 1829     (approximate)   History   Head Injury   HPI  Amanda Vasquez is a 53 year old female presenting to the emergency department for evaluation after a fall.  Patient was walking down her steps when she tripped and fell and hit her head.  Unsure if she had LOC following the fall, denies any preceding chest pain, shortness of breath, lightheadedness.  She is anticoagulated on warfarin for antiphospholipid antibody syndrome.  Recently had elevated INR at 4.3, but has been holding her medications since that time as directed.  In triage reported pain in her right arm, right leg, tells me that these are now resolved.  Reports some mild pain over her right lower back that feels like musculoskeletal strain as well as pain in her paraspinous muscles in her neck.  No numbness, tingling, focal weakness.     Physical Exam   Triage Vital Signs: ED Triage Vitals  Encounter Vitals Group     BP 09/07/23 1535 139/76     Systolic BP Percentile --      Diastolic BP Percentile --      Pulse Rate 09/07/23 1535 88     Resp 09/07/23 1535 16     Temp 09/07/23 1535 98.1 F (36.7 C)     Temp Source 09/07/23 1535 Oral     SpO2 09/07/23 1535 95 %     Weight 09/07/23 1536 240 lb (108.9 kg)     Height 09/07/23 1536 5\' 5"  (1.651 m)     Head Circumference --      Peak Flow --      Pain Score 09/07/23 1536 8     Pain Loc --      Pain Education --      Exclude from Growth Chart --     Most recent vital signs: Vitals:   09/07/23 1535  BP: 139/76  Pulse: 88  Resp: 16  Temp: 98.1 F (36.7 C)  SpO2: 95%    Nursing notes and vital signs reviewed.  General: Adult female, sitting in recliner, awake interactive Head: Large hematoma over the forehead without open area of skin, tenderness over forehead into upper scalp. Neck: No midline tenderness, tenderness over right  paraspinous muscles. Chest: Symmetric chest rise, no tenderness to palpation.  Cardiac: Regular rhythm and rate.  Respiratory: Lungs clear to auscultation Abdomen: Soft, nondistended. No tenderness to palpation.  Pelvis: Stable in AP and lateral compression. No tenderness to palpation. MSK: No deformity to bilateral upper and lower extremity. Full range of motion to bilateral upper lower extremity with no pain. Back: Reproducible tenderness palpation of the right paraspinous musculature, no tenderness over the ribs, no midline tenderness. Neuro: Alert, oriented. GCS 15. 5 out of 5 strength in bilateral upper and lower extremities. Normal sensation to light touch in bilateral upper and lower extremity. Skin: No evidence of burns or lacerations.  ED Results / Procedures / Treatments   Labs (all labs ordered are listed, but only abnormal results are displayed) Labs Reviewed - No data to display   EKG EKG independently reviewed interpreted by myself (ER attending) demonstrates:    RADIOLOGY Imaging independently reviewed and interpreted by myself demonstrates:  CT head without acute bleed, frontal hematoma noted CT C-spine without acute fracture   PROCEDURES:  Critical Care performed: No  Procedures   MEDICATIONS ORDERED IN ED: Medications -  No data to display   IMPRESSION / MDM / ASSESSMENT AND PLAN / ED COURSE  I reviewed the triage vital signs and the nursing notes.  Differential diagnosis includes, but is not limited to, intracranial bleed, spine fracture, skull fracture, no evidence of thoracoabdominal trauma  Patient's presentation is most consistent with acute presentation with potential threat to life or bodily function.  53 year old female on warfarin presenting after a fall with head trauma.  Vital stable on presentation.  Hematoma on exam, but CT scan of head and neck fortunately reassuring without acute traumatic injuries.  Does have some right lower back pain,  discussed further imaging, patient declines which I do think is reasonable given likely musculoskeletal pain.  Strict return precautions provided.  Patient discharged in stable condition.     FINAL CLINICAL IMPRESSION(S) / ED DIAGNOSES   Final diagnoses:  Closed head injury, initial encounter  Hematoma of frontal scalp, initial encounter     Rx / DC Orders   ED Discharge Orders     None        Note:  This document was prepared using Dragon voice recognition software and may include unintentional dictation errors.   Trinna Post, MD 09/07/23 2000

## 2023-09-07 NOTE — ED Provider Triage Note (Signed)
Emergency Medicine Provider Triage Evaluation Note  Amanda Vasquez , a 53 y.o. female  was evaluated in triage.  Pt complains of head injury. Tripped and fall on stairs, +HS and unsure if she had LOC. Able to walk but feels dizzy. No N/V. Mild neck discomfort  Review of Systems  Positive: Headache, neck pain Negative: Weakness, paresthesias  Physical Exam  LMP 03/05/2018 (Exact Date) Comment: last week, no chance of pregnancy Gen:   Awake, no distress   Resp:  Normal effort  MSK:   Moves extremities without difficulty  Other:    Medical Decision Making  Medically screening exam initiated at 3:36 PM.  Appropriate orders placed.  Amanda Vasquez was informed that the remainder of the evaluation will be completed by another provider, this initial triage assessment does not replace that evaluation, and the importance of remaining in the ED until their evaluation is complete.     Jackelyn Hoehn, PA-C 09/07/23 1537

## 2023-09-07 NOTE — ED Triage Notes (Signed)
Patient reports while walking down her steps, had mechanical fall and hit her head on steps.   C/o pain in right arm, right leg, head and neck.   Takes coumadin daily.

## 2023-09-08 ENCOUNTER — Encounter: Payer: Self-pay | Admitting: Oncology

## 2023-09-13 ENCOUNTER — Encounter: Payer: Self-pay | Admitting: Oncology

## 2023-09-13 LAB — PROTIME-INR: INR: 2.7 — AB (ref 0.80–1.20)

## 2023-09-14 ENCOUNTER — Ambulatory Visit (INDEPENDENT_AMBULATORY_CARE_PROVIDER_SITE_OTHER): Payer: Medicare Other | Admitting: Nurse Practitioner

## 2023-09-14 ENCOUNTER — Encounter (INDEPENDENT_AMBULATORY_CARE_PROVIDER_SITE_OTHER): Payer: Medicare Other | Admitting: Internal Medicine

## 2023-09-14 ENCOUNTER — Encounter: Payer: Self-pay | Admitting: Nurse Practitioner

## 2023-09-14 VITALS — BP 124/70 | HR 76 | Temp 98.3°F | Ht 65.0 in | Wt 247.6 lb

## 2023-09-14 DIAGNOSIS — E1165 Type 2 diabetes mellitus with hyperglycemia: Secondary | ICD-10-CM

## 2023-09-14 DIAGNOSIS — S060XAA Concussion with loss of consciousness status unknown, initial encounter: Secondary | ICD-10-CM | POA: Insufficient documentation

## 2023-09-14 DIAGNOSIS — F909 Attention-deficit hyperactivity disorder, unspecified type: Secondary | ICD-10-CM

## 2023-09-14 DIAGNOSIS — S060XAD Concussion with loss of consciousness status unknown, subsequent encounter: Secondary | ICD-10-CM | POA: Diagnosis not present

## 2023-09-14 NOTE — Progress Notes (Signed)
[name], NP-C Phone: [phone]  Amanda Vasquez is a 53 y.o. female who presents today for ED follow up.  Discussed the use of AI scribe software for clinical note transcription with the patient, who gave verbal consent to proceed.  History of Present Illness   The patient, on anticoagulation therapy, presents following a fall down concrete steps a week ago. They report stepping on a mat and losing balance, resulting in a headfirst fall down five steps and against a brick wall. The patient recalls their head bouncing off multiple steps and the wall. They were subsequently evaluated in the ED where a CT of the head and cervical spine showed no fractures or bleeding.  Since the fall, the patient has been experiencing persistent headaches, hypersensitivity to light, and increased drowsiness. They report falling asleep unexpectedly, even while holding objects, and being fearful that they will fall asleep while driving. They also report a couple of episodes of nausea and vomiting since the hospital visit. They have been using a cold cap and ice bag for relief from the headaches.  The patient also reports a history of migraines, for which they previously took Nurtec. They are currently on morphine twice daily and have oxycodone for PRN use, but they express reluctance to use these medications due to fear. They also mention a recent diagnosis of ADHD, for which they have an upcoming appointment with a specialist.      Social History   Tobacco Use  Smoking Status Former  . Current packs/day: 0.00  . Average packs/day: 0.3 packs/day for 24.2 years (6.1 ttl pk-yrs)  . Types: Cigarettes  . Start date: 06/08/1986  . Quit date: 08/22/2010  . Years since quitting: 13.0  Smokeless Tobacco Never    Current Outpatient Medications on File Prior to Visit  Medication Sig Dispense Refill  . ALPRAZolam (XANAX) 0.5 MG tablet TAKE ONE TABLET BY MOUTH EVERY DAY AS NEEDED FOR ANXIETY. 30 tablet 2  .  atorvastatin (LIPITOR) 40 MG tablet TAKE 1 TABLET BY MOUTH DAILY AT 6 IN THEEVENING. 90 tablet 3  . botulinum toxin Type A (BOTOX) 200 units injection Provider to inject 155 units into the muscles of the head and neck every 3 months. Discard remainder. 1 each 0  . buPROPion (WELLBUTRIN XL) 300 MG 24 hr tablet Take 1 tablet (300 mg total) by mouth daily. 90 tablet 3  . calcium carbonate (OSCAL) 1500 (600 Ca) MG TABS tablet Take 1,500 mg by mouth daily with breakfast.    . Cetirizine HCl 10 MG CAPS Take 1 capsule (10 mg total) by mouth daily as needed. Take by mouth. 30 capsule   . Cholecalciferol (VITAMIN D) 50 MCG (2000 UT) CAPS Take 2,000 Units by mouth daily.    . Continuous Glucose Sensor (DEXCOM G7 SENSOR) MISC Use to Monitor glucose continuously, change every 10 days 6 each 3  . CYANOCOBALAMIN PO Take by mouth daily. 1 gummy    . cyclobenzaprine (FLEXERIL) 10 MG tablet Take 1 tablet (10 mg total) by mouth at bedtime. 90 tablet 3  . dapagliflozin propanediol (FARXIGA) 10 MG TABS tablet Take 1 tablet (10 mg total) by mouth daily before breakfast. 90 tablet 3  . enoxaparin (LOVENOX) 80 MG/0.8ML injection Inject 0.8 mLs (80 mg total) into the skin every 12 (twelve) hours. Pt has surgery and after she has to be on coumadin and lovenox until INR is 2 and then stop the lovenox. INR1.2 needs more injection 16 mL 0  . Galcanezumab-gnlm (  EMGALITY) 120 MG/ML SOAJ Inject 120 mg into the skin every 30 (thirty) days.    . insulin glargine (LANTUS SOLOSTAR) 100 UNIT/ML Solostar Pen Inject 20-22 Units into the skin at bedtime. 30 mL 3  . Insulin Pen Needle 32G X 4 MM MISC Use 1x a day 100 each 3  . metFORMIN (GLUCOPHAGE) 1000 MG tablet TAKE ONE (1) TABLET BY MOUTH TWO TIMES PER DAY WITH MEALS 180 tablet 3  . morphine (MS CONTIN) 60 MG 12 hr tablet Take 60 mg by mouth every 12 (twelve) hours.    . Multiple Vitamin (MULTIVITAMIN) tablet Take 1 tablet by mouth daily.    . nitroGLYCERIN (NITROSTAT) 0.4 MG SL  tablet Place 1 tablet (0.4 mg total) under the tongue every 5 (five) minutes as needed for chest pain. 25 tablet 3  . NON FORMULARY CBD Gummies    . oxyCODONE (OXY IR/ROXICODONE) 5 MG immediate release tablet Take 1 tablet (5 mg total) by mouth every 4 (four) hours as needed for severe pain. Per pain clinic Dr Vear Clock 30 tablet   . Polyethylene Glycol 3350 (MIRALAX PO) Take 17 g by mouth daily as needed.    . pregabalin (LYRICA) 100 MG capsule Take 100 mg by mouth 3 (three) times daily.    . promethazine (PHENERGAN) 25 MG tablet Take 1 tablet (25 mg total) by mouth 2 (two) times daily as needed for nausea or vomiting. 60 tablet 5  . propranolol ER (INDERAL LA) 80 MG 24 hr capsule Take 1 capsule (80 mg total) by mouth daily. 90 capsule 3  . Rimegepant Sulfate (NURTEC) 75 MG TBDP Take 75 mg by mouth daily as needed. For migraines. Take as close to onset of migraine as possible. One daily maximum. 16 tablet 6  . sertraline (ZOLOFT) 100 MG tablet Take 1.5 tablets (150 mg total) by mouth daily. TAKE 150 mg qd 135 tablet 3  . traZODone (DESYREL) 100 MG tablet TAKE 1 TABLET BY MOUTH AT BEDTIME AS NEEDED FOR SLEEP. 90 tablet 3  . valACYclovir (VALTREX) 1000 MG tablet TAKE 1 TABLET BY MOUTH TWICE DAILY WITH FOOD FOR OUTBREAK FOR 3-7 DAYS AS DIRECTED. 60 tablet 2  . warfarin (COUMADIN) 10 MG tablet TAKE ONE TABLET BY MOUTH EVERY DAY 30 tablet 3  . warfarin (COUMADIN) 10 MG tablet TAKE ONE TABLET BY MOUTH AS DIRECTED PT TO HAVE 10 MG DAILY FOR 6 DAYS AND ON7TH DAY TAKE 1/2 TABLET.  REPEAT EACH WEEK UNTIL A CHANGE HAS BEEN MADE 29 tablet 3  . warfarin (COUMADIN) 10 MG tablet TAKE ONE TABLET BY MOUTH AS DIRECTED PT TO HAVE 10 MG DAILY FOR 6 DAYS AND ON7TH DAY TAKE 1/2 TABLET.  REPEAT EACH WEEK UNTIL A CHANGE HAS BEEN MADE 29 tablet 5   No current facility-administered medications on file prior to visit.    ROS see history of present illness  Objective  Physical Exam Vitals:   09/14/23 1541 09/14/23 1606   BP: (!) 140/82 124/70  Pulse: 76   Temp: 98.3 F (36.8 C)   SpO2: 97%     BP Readings from Last 3 Encounters:  09/14/23 124/70  09/07/23 139/76  08/01/23 136/70   Wt Readings from Last 3 Encounters:  09/14/23 247 lb 9.6 oz (112.3 kg)  09/07/23 240 lb (108.9 kg)  08/01/23 236 lb 3.2 oz (107.1 kg)    Physical Exam Constitutional:      General: She is not in acute distress.    Appearance: Normal appearance.  HENT:     Head: Normocephalic. Contusion present.     Comments: Significant bruising noted around inner eyes, across bridge and down nose. Bruising also noted on forehead near scalp. Eyes:     Extraocular Movements: Extraocular movements intact.     Conjunctiva/sclera: Conjunctivae normal.  Cardiovascular:     Rate and Rhythm: Normal rate and regular rhythm.     Heart sounds: Normal heart sounds.  Pulmonary:     Effort: Pulmonary effort is normal.     Breath sounds: Normal breath sounds.  Skin:    General: Skin is warm and dry.  Neurological:     General: No focal deficit present.     Mental Status: She is alert and oriented to person, place, and time.     Cranial Nerves: Cranial nerves 2-12 are intact.     Sensory: Sensation is intact.     Motor: Motor function is intact. No weakness.     Coordination: Coordination is intact.  Psychiatric:        Mood and Affect: Mood normal.        Behavior: Behavior normal.   Assessment/Plan: Please see individual problem list.  Closed head injury with concussion, with unknown loss of consciousness status, subsequent encounter Assessment & Plan: A recent fall caused head trauma, leading to persistent headaches, drowsiness, and intermittent nausea. CT scans of the head and cervical spine were normal. There are no changes in speech or vision and no persistent vomiting. Advise brain rest with low lighting, avoiding screens, and minimal physical exertion until headache-free for several days. Take pain medication as needed. Monitor  symptoms closely and seek immediate medical attention if symptoms worsen or new symptoms develop.   Attention deficit hyperactivity disorder (ADHD), unspecified ADHD type Assessment & Plan: Recently diagnosed and awaiting a specialist appointment. Continue with the planned specialist appointment.     Return if symptoms worsen or fail to improve.   [name], NP-C [facility]

## 2023-09-14 NOTE — Assessment & Plan Note (Signed)
Recently diagnosed and awaiting a specialist appointment. Continue with the planned specialist appointment.

## 2023-09-14 NOTE — Assessment & Plan Note (Signed)
A recent fall caused head trauma, leading to persistent headaches, drowsiness, and intermittent nausea. CT scans of the head and cervical spine were normal. There are no changes in speech or vision and no persistent vomiting. Advise brain rest with low lighting, avoiding screens, and minimal physical exertion until headache-free for several days. Take pain medication as needed. Monitor symptoms closely and seek immediate medical attention if symptoms worsen or new symptoms develop.

## 2023-09-15 ENCOUNTER — Other Ambulatory Visit: Payer: Self-pay | Admitting: Nurse Practitioner

## 2023-09-15 DIAGNOSIS — F32A Depression, unspecified: Secondary | ICD-10-CM

## 2023-09-18 NOTE — Telephone Encounter (Addendum)
    CGM interpretation: -At today's visit, we reviewed her CGM downloads: It appears that 40% of values are in target range (goal >70%), while 60% are higher than 180 (goal <25%), and 0% are lower than 70 (goal <4%).  The calculated average blood sugar is 201.  The projected HbA1c for the next 3 months (GMI) is 8.1%. -Reviewing the CGM trends, sugars appear to be elevated at all times of the day, fluctuating around the upper limits of the target range, mostly above target, especially after dinner and remaining elevated throughout the night. -She is on bedrest so she is not benefiting from activity to help with the blood sugars. -Per my records, she is on the following regimen: - Metformin 1000 mg 2x a day, with meals - Farxiga 10 mg daily in a.m.  - Lantus 20 units at bedtime -Since sugars are almost uniformly elevated, I would suggest an increase in Lantus dose to 25 units, and even higher if needed afterwards.  I will advise her to increase by 2 units every 2 days afterwards, if sugars do not start to improve.  Carlus Pavlov MD

## 2023-09-20 LAB — PROTIME-INR: INR: 2.7 — AB (ref 0.80–1.20)

## 2023-09-21 ENCOUNTER — Encounter: Payer: Self-pay | Admitting: Oncology

## 2023-09-26 LAB — PROTIME-INR: INR: 2.9 — AB (ref 0.80–1.20)

## 2023-09-28 ENCOUNTER — Encounter: Payer: Self-pay | Admitting: Oncology

## 2023-10-03 DIAGNOSIS — Z79891 Long term (current) use of opiate analgesic: Secondary | ICD-10-CM | POA: Diagnosis not present

## 2023-10-03 DIAGNOSIS — G894 Chronic pain syndrome: Secondary | ICD-10-CM | POA: Diagnosis not present

## 2023-10-03 DIAGNOSIS — G890 Central pain syndrome: Secondary | ICD-10-CM | POA: Diagnosis not present

## 2023-10-03 DIAGNOSIS — M62830 Muscle spasm of back: Secondary | ICD-10-CM | POA: Diagnosis not present

## 2023-10-03 LAB — PROTIME-INR: INR: 2.7 — AB (ref 0.80–1.20)

## 2023-10-04 ENCOUNTER — Encounter: Payer: Self-pay | Admitting: Oncology

## 2023-10-04 ENCOUNTER — Inpatient Hospital Stay: Payer: Medicare Other | Attending: Oncology | Admitting: Oncology

## 2023-10-04 ENCOUNTER — Other Ambulatory Visit: Payer: Self-pay | Admitting: *Deleted

## 2023-10-04 VITALS — BP 130/54 | HR 80 | Temp 97.5°F | Resp 18 | Wt 244.1 lb

## 2023-10-04 DIAGNOSIS — Z9089 Acquired absence of other organs: Secondary | ICD-10-CM | POA: Diagnosis not present

## 2023-10-04 DIAGNOSIS — F32A Depression, unspecified: Secondary | ICD-10-CM | POA: Diagnosis not present

## 2023-10-04 DIAGNOSIS — E785 Hyperlipidemia, unspecified: Secondary | ICD-10-CM | POA: Diagnosis not present

## 2023-10-04 DIAGNOSIS — Z86711 Personal history of pulmonary embolism: Secondary | ICD-10-CM | POA: Insufficient documentation

## 2023-10-04 DIAGNOSIS — R0789 Other chest pain: Secondary | ICD-10-CM | POA: Insufficient documentation

## 2023-10-04 DIAGNOSIS — I7090 Unspecified atherosclerosis: Secondary | ICD-10-CM | POA: Insufficient documentation

## 2023-10-04 DIAGNOSIS — W109XXA Fall (on) (from) unspecified stairs and steps, initial encounter: Secondary | ICD-10-CM | POA: Insufficient documentation

## 2023-10-04 DIAGNOSIS — Z87891 Personal history of nicotine dependence: Secondary | ICD-10-CM | POA: Diagnosis not present

## 2023-10-04 DIAGNOSIS — E669 Obesity, unspecified: Secondary | ICD-10-CM | POA: Diagnosis not present

## 2023-10-04 DIAGNOSIS — F419 Anxiety disorder, unspecified: Secondary | ICD-10-CM | POA: Insufficient documentation

## 2023-10-04 DIAGNOSIS — G894 Chronic pain syndrome: Secondary | ICD-10-CM | POA: Insufficient documentation

## 2023-10-04 DIAGNOSIS — E119 Type 2 diabetes mellitus without complications: Secondary | ICD-10-CM | POA: Diagnosis not present

## 2023-10-04 DIAGNOSIS — S0003XA Contusion of scalp, initial encounter: Secondary | ICD-10-CM | POA: Insufficient documentation

## 2023-10-04 DIAGNOSIS — Z9049 Acquired absence of other specified parts of digestive tract: Secondary | ICD-10-CM | POA: Insufficient documentation

## 2023-10-04 DIAGNOSIS — I2699 Other pulmonary embolism without acute cor pulmonale: Secondary | ICD-10-CM | POA: Insufficient documentation

## 2023-10-04 DIAGNOSIS — D6861 Antiphospholipid syndrome: Secondary | ICD-10-CM | POA: Diagnosis not present

## 2023-10-04 DIAGNOSIS — R0781 Pleurodynia: Secondary | ICD-10-CM | POA: Insufficient documentation

## 2023-10-04 DIAGNOSIS — M47812 Spondylosis without myelopathy or radiculopathy, cervical region: Secondary | ICD-10-CM | POA: Insufficient documentation

## 2023-10-04 DIAGNOSIS — Z5986 Financial insecurity: Secondary | ICD-10-CM | POA: Diagnosis not present

## 2023-10-04 DIAGNOSIS — Z79899 Other long term (current) drug therapy: Secondary | ICD-10-CM | POA: Insufficient documentation

## 2023-10-04 DIAGNOSIS — Z7901 Long term (current) use of anticoagulants: Secondary | ICD-10-CM | POA: Diagnosis not present

## 2023-10-04 MED ORDER — ENOXAPARIN SODIUM 80 MG/0.8ML IJ SOSY: 80.0000 mg | PREFILLED_SYRINGE | Freq: Two times a day (BID) | INTRAMUSCULAR | 0 refills | Status: AC
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
## 2023-10-10 ENCOUNTER — Encounter: Payer: Self-pay | Admitting: Nurse Practitioner

## 2023-10-10 DIAGNOSIS — D6861 Antiphospholipid syndrome: Secondary | ICD-10-CM | POA: Diagnosis not present

## 2023-10-10 DIAGNOSIS — Z7901 Long term (current) use of anticoagulants: Secondary | ICD-10-CM | POA: Diagnosis not present

## 2023-10-10 LAB — PROTIME-INR: INR: 3 — AB (ref 0.80–1.20)

## 2023-10-12 ENCOUNTER — Encounter: Payer: Self-pay | Admitting: Oncology

## 2023-10-17 LAB — PROTIME-INR: INR: 3.1 — AB (ref 0.80–1.20)

## 2023-10-20 ENCOUNTER — Encounter: Payer: Self-pay | Admitting: Oncology

## 2023-10-23 DIAGNOSIS — M65351 Trigger finger, right little finger: Secondary | ICD-10-CM | POA: Diagnosis not present

## 2023-10-31 LAB — PROTIME-INR: INR: 2.9 — AB (ref 0.80–1.20)

## 2023-11-01 ENCOUNTER — Encounter: Payer: Self-pay | Admitting: Oncology

## 2023-11-02 ENCOUNTER — Encounter: Payer: Self-pay | Admitting: Nurse Practitioner

## 2023-11-03 ENCOUNTER — Other Ambulatory Visit: Payer: Self-pay | Admitting: Internal Medicine

## 2023-11-03 DIAGNOSIS — E1165 Type 2 diabetes mellitus with hyperglycemia: Secondary | ICD-10-CM

## 2023-11-07 DIAGNOSIS — G890 Central pain syndrome: Secondary | ICD-10-CM | POA: Diagnosis not present

## 2023-11-07 DIAGNOSIS — M62830 Muscle spasm of back: Secondary | ICD-10-CM | POA: Diagnosis not present

## 2023-11-07 DIAGNOSIS — Z79891 Long term (current) use of opiate analgesic: Secondary | ICD-10-CM | POA: Diagnosis not present

## 2023-11-07 DIAGNOSIS — G894 Chronic pain syndrome: Secondary | ICD-10-CM | POA: Diagnosis not present

## 2023-11-07 LAB — PROTIME-INR: INR: 2.8 — AB (ref 0.80–1.20)

## 2023-11-09 ENCOUNTER — Encounter: Payer: Self-pay | Admitting: Oncology

## 2023-11-15 DIAGNOSIS — Z7901 Long term (current) use of anticoagulants: Secondary | ICD-10-CM | POA: Diagnosis not present

## 2023-11-15 DIAGNOSIS — D6861 Antiphospholipid syndrome: Secondary | ICD-10-CM | POA: Diagnosis not present

## 2023-11-15 LAB — PROTIME-INR: INR: 3 — AB (ref 0.80–1.20)

## 2023-11-16 ENCOUNTER — Encounter: Payer: Self-pay | Admitting: Oncology

## 2023-11-21 LAB — PROTIME-INR: INR: 2.6 — AB (ref 0.80–1.20)

## 2023-11-22 DIAGNOSIS — M65332 Trigger finger, left middle finger: Secondary | ICD-10-CM | POA: Diagnosis not present

## 2023-11-22 DIAGNOSIS — Z4789 Encounter for other orthopedic aftercare: Secondary | ICD-10-CM | POA: Diagnosis not present

## 2023-11-23 ENCOUNTER — Encounter: Payer: Self-pay | Admitting: Oncology

## 2023-11-28 LAB — PROTIME-INR: INR: 3.1 — AB (ref 0.80–1.20)

## 2023-11-29 ENCOUNTER — Telehealth: Payer: Self-pay

## 2023-11-29 ENCOUNTER — Encounter: Payer: Self-pay | Admitting: Oncology

## 2023-11-29 NOTE — Telephone Encounter (Signed)
 INR drawn yesterday 11/28/23 3.10.  Per Dr. Smith Robert "No change in dose. Stay the course". Outbound call to patient; voice message left.  Informed on voicemail that a Mychart message of information noted above.

## 2023-11-29 NOTE — Telephone Encounter (Signed)
-----   Message from Creig Hines sent at 11/29/2023 11:54 AM EDT ----- No change in dose. Stay the course

## 2023-11-30 ENCOUNTER — Encounter: Payer: Self-pay | Admitting: Family Medicine

## 2023-11-30 ENCOUNTER — Ambulatory Visit: Admitting: Nurse Practitioner

## 2023-11-30 ENCOUNTER — Telehealth: Payer: Self-pay

## 2023-11-30 ENCOUNTER — Ambulatory Visit: Admitting: Family Medicine

## 2023-11-30 ENCOUNTER — Ambulatory Visit: Payer: Medicare Other | Admitting: Internal Medicine

## 2023-11-30 VITALS — BP 122/68 | HR 74 | Temp 98.5°F | Resp 20 | Ht 65.0 in | Wt 242.1 lb

## 2023-11-30 DIAGNOSIS — M2550 Pain in unspecified joint: Secondary | ICD-10-CM

## 2023-11-30 DIAGNOSIS — M791 Myalgia, unspecified site: Secondary | ICD-10-CM

## 2023-11-30 DIAGNOSIS — D6861 Antiphospholipid syndrome: Secondary | ICD-10-CM | POA: Diagnosis not present

## 2023-11-30 DIAGNOSIS — G43711 Chronic migraine without aura, intractable, with status migrainosus: Secondary | ICD-10-CM

## 2023-11-30 LAB — CBC WITH DIFFERENTIAL/PLATELET
Basophils Absolute: 0 10*3/uL (ref 0.0–0.1)
Basophils Relative: 0.5 % (ref 0.0–3.0)
Eosinophils Absolute: 0.3 10*3/uL (ref 0.0–0.7)
Eosinophils Relative: 4.3 % (ref 0.0–5.0)
HCT: 41 % (ref 36.0–46.0)
Hemoglobin: 13.5 g/dL (ref 12.0–15.0)
Lymphocytes Relative: 27.8 % (ref 12.0–46.0)
Lymphs Abs: 2 10*3/uL (ref 0.7–4.0)
MCHC: 32.9 g/dL (ref 30.0–36.0)
MCV: 83 fl (ref 78.0–100.0)
Monocytes Absolute: 0.5 10*3/uL (ref 0.1–1.0)
Monocytes Relative: 7.1 % (ref 3.0–12.0)
Neutro Abs: 4.3 10*3/uL (ref 1.4–7.7)
Neutrophils Relative %: 60.3 % (ref 43.0–77.0)
Platelets: 278 10*3/uL (ref 150.0–400.0)
RBC: 4.94 Mil/uL (ref 3.87–5.11)
RDW: 15.4 % (ref 11.5–15.5)
WBC: 7.1 10*3/uL (ref 4.0–10.5)

## 2023-11-30 LAB — C-REACTIVE PROTEIN: CRP: <1 mg/dL (ref 0.5–20.0)

## 2023-11-30 LAB — TSH: TSH: 1.11 u[IU]/mL (ref 0.35–5.50)

## 2023-11-30 LAB — CK: Total CK: 89 U/L (ref 7–177)

## 2023-11-30 LAB — URIC ACID: Uric Acid, Serum: 2.7 mg/dL (ref 2.4–7.0)

## 2023-11-30 LAB — SEDIMENTATION RATE: Sed Rate: 34 mm/h — ABNORMAL HIGH (ref 0–30)

## 2023-11-30 NOTE — Progress Notes (Signed)
 SUBJECTIVE:   Chief Complaint  Patient presents with   Joint Swelling    Hurts so bad she can not get out of bed   HPI Presents for acute visit  Discussed the use of AI scribe software for clinical note transcription with the patient, who gave verbal consent to proceed.  History of Present Illness Amanda Vasquez is a 53 year old female with antiphospholipid syndrome who presents with joint pain and swelling.  She has been experiencing joint pain and swelling for the past few weeks, affecting every joint in her body, including her toes, shoulders, and fingers. The pain is severe enough to prevent her from getting out of bed at times. The symptoms are intermittent, with no clear pattern, and she sometimes notices small swollen bumps on her joints. Previous tests for rheumatoid arthritis and autoimmune conditions were negative, including ANA, rheumatoid factor, and thyroid  tests. Warmth and movement help alleviate her joint symptoms, although during flare-ups, she feels 'frozen in bed'. She experiences stiffness in the mornings and decreased strength in her hands and forearms, which is not a new symptom.  She has a history of antiphospholipid syndrome and is on Coumadin  without bleeding issues. She also has a chronic pain condition managed by a specialist and is on Lyrica, which was adjusted six months ago due to causing swelling in her knees. She reports a high pain tolerance and ongoing management with her pain specialist.  Her current medications include albutein, Lipitor, and Botox  for migraines, which she finds effective. No fevers, chest pain, shortness of breath, or bleeding.    PERTINENT PMH / PSH: As above  OBJECTIVE:  BP 122/68   Pulse 74   Temp 98.5 F (36.9 C)   Resp 20   Ht 5\' 5"  (1.651 m)   Wt 242 lb 2 oz (109.8 kg)   LMP 03/05/2018 (Exact Date) Comment: last week, no chance of pregnancy  SpO2 96%   BMI 40.29 kg/m    Physical Exam Vitals reviewed.   Constitutional:      General: She is not in acute distress.    Appearance: She is obese. She is not ill-appearing.  HENT:     Head: Normocephalic.     Right Ear: Tympanic membrane, ear canal and external ear normal.     Left Ear: Tympanic membrane, ear canal and external ear normal.     Nose: Nose normal.     Mouth/Throat:     Mouth: Mucous membranes are moist.  Eyes:     Extraocular Movements: Extraocular movements intact.     Conjunctiva/sclera: Conjunctivae normal.     Pupils: Pupils are equal, round, and reactive to light.  Neck:     Thyroid : No thyromegaly or thyroid  tenderness.     Vascular: No carotid bruit.  Cardiovascular:     Rate and Rhythm: Normal rate and regular rhythm.     Pulses: Normal pulses.     Heart sounds: Normal heart sounds.  Pulmonary:     Effort: Pulmonary effort is normal.     Breath sounds: Normal breath sounds.  Abdominal:     General: Bowel sounds are normal. There is no distension.     Palpations: Abdomen is soft.     Tenderness: There is no abdominal tenderness. There is no right CVA tenderness, left CVA tenderness, guarding or rebound.  Musculoskeletal:        General: Tenderness present. No swelling, deformity or signs of injury. Normal range of motion.  Cervical back: Normal range of motion.     Right lower leg: No edema.     Left lower leg: No edema.  Lymphadenopathy:     Cervical: No cervical adenopathy.  Skin:    General: Skin is warm.     Capillary Refill: Capillary refill takes less than 2 seconds.     Findings: No erythema or rash.  Neurological:     General: No focal deficit present.     Mental Status: She is alert and oriented to person, place, and time.     Motor: No weakness.  Psychiatric:        Mood and Affect: Mood normal.        Behavior: Behavior normal.        Thought Content: Thought content normal.        Judgment: Judgment normal.           11/30/2023   11:51 AM 09/14/2023    3:44 PM 03/23/2023    8:18 AM  10/28/2022    4:16 PM 09/13/2022    2:17 PM  Depression screen PHQ 2/9  Decreased Interest 1 0 0 0 0  Down, Depressed, Hopeless 1 0 0 1 0  PHQ - 2 Score 2 0 0 1 0  Altered sleeping 2 3 0 2 2  Tired, decreased energy 3 3 3 2 2   Change in appetite 3 3 3 3  0  Feeling bad or failure about yourself  1 0 0 2 0  Trouble concentrating 3 3 3 1 1   Moving slowly or fidgety/restless 2 2 0 0 0  Suicidal thoughts 0 0 0 0 0  PHQ-9 Score 16 14 9 11 5   Difficult doing work/chores Somewhat difficult Very difficult Very difficult Very difficult Very difficult      11/30/2023   11:51 AM 09/14/2023    3:44 PM 03/23/2023    8:18 AM 10/28/2022    4:16 PM  GAD 7 : Generalized Anxiety Score  Nervous, Anxious, on Edge 2 3 2 3   Control/stop worrying 2 2 2 3   Worry too much - different things 2 2 2 2   Trouble relaxing 3 3 2 3   Restless 2 2 0 1  Easily annoyed or irritable 1 1 0 0  Afraid - awful might happen 2 2 2 3   Total GAD 7 Score 14 15 10 15   Anxiety Difficulty Somewhat difficult Very difficult Very difficult Extremely difficult    ASSESSMENT/PLAN:  Polyarthralgia Assessment & Plan: Severe joint pain with intermittent swelling and nodules. Negative tests for rheumatoid arthritis and thyroid  issues. Differential includes seronegative arthritis, tendinitis, or autoimmune disorder. Pain improves with warmth and movement. - Order blood work including CK to assess for muscle involvement due to cholesterol medication. - Consider rheumatology referral if symptoms persist or worsen. - Advise follow-up with primary care for ongoing management.  Orders: -     TSH -     CBC with Differential/Platelet -     Sedimentation rate -     C-reactive protein -     ANA -     Rheumatoid factor -     Uric acid  Myalgia Assessment & Plan: On Lipitor for cholesterol management. Need to rule out muscle-related issues due to cholesterol medication. - Order CK to assess for muscle involvement due to cholesterol  medication.  Orders: -     TSH -     Sedimentation rate -     C-reactive protein -  CK -     Aldolase  Anti-phospholipid antibody syndrome [facility]) Assessment & Plan: Managed by Dr. Seretha Dance. On Coumadin .   Chronic migraine without aura, with intractable migraine, so stated, with status migrainosus Assessment & Plan: Receives effective Botox  injections, follows with Neurology    PDMP reviewed  No follow-ups on file.  Valli Gaw, MD

## 2023-11-30 NOTE — Patient Instructions (Addendum)
 It was a pleasure meeting you today. Thank you for allowing me to take part in your health care.  Our goals for today as we discussed include:  We will get some labs today.  If they are abnormal or we need to do something about them, I will call you.  If they are normal, I will send you a message on MyChart (if it is active) or a letter in the mail.  If you don't hear from [place] in 2 weeks, please call the office at the number below.   Follow up with PCP if no improvement   This is a list of the screening recommended for you and due dates:  Health Maintenance  Topic Date Due   Medicare Annual Wellness Visit  09/22/2022   Mammogram  04/10/2023   COVID-19 Vaccine (8 - Moderna risk 2024-25 season) 10/25/2023   Hemoglobin A1C  01/30/2024   Eye exam for diabetics  03/09/2024   Yearly kidney function blood test for diabetes  03/22/2024   Flu Shot  03/22/2024   Pap with HPV screening  04/09/2024   Yearly kidney health urinalysis for diabetes  04/26/2024   Colon Cancer Screening  05/24/2024   Complete foot exam   07/31/2024   Pneumococcal Vaccination (3 of 3 - PCV20 or PCV21) 08/04/2026   DTaP/Tdap/Td vaccine (4 - Td or Tdap) 10/25/2028   Hepatitis C Screening  Completed   HIV Screening  Completed   Zoster (Shingles) Vaccine  Completed   HPV Vaccine  Aged Out   Meningitis B Vaccine  Aged Out      If you have any questions or concerns, please do not hesitate to call the office at [phone].  I look forward to our next visit and until then take care and stay safe.  Regards,   Dana Allan, MD   [facility]

## 2023-11-30 NOTE — Telephone Encounter (Signed)
 Left message to return call to our office.  Okay to relay message to pt. Please document when pt is spoke to.

## 2023-11-30 NOTE — Telephone Encounter (Signed)
-----   Message from Dana Allan sent at 11/30/2023  3:59 PM EDT ----- Acceptable blood work. Sedimentation rate is slightly elevated likely from chronic disease.  Other labs pending

## 2023-12-01 NOTE — Telephone Encounter (Signed)
 Copied from CRM [id number]. Topic: General - Other >> Dec 01, 2023 11:24 AM Fredrich Romans wrote: Reason for CRM: Patient returned call to [facility] regarding lab results.I relayed message to patient.Patient verbalized understanding

## 2023-12-04 LAB — ALDOLASE: Aldolase: 4.5 U/L (ref <=8.1)

## 2023-12-04 LAB — RHEUMATOID FACTOR: Rheumatoid fact SerPl-aCnc: <10 [IU]/mL (ref <14)

## 2023-12-04 LAB — ANA: Anti Nuclear Antibody (ANA): NEGATIVE

## 2023-12-05 LAB — PROTIME-INR: INR: 2.8 — AB (ref 0.80–1.20)

## 2023-12-07 ENCOUNTER — Encounter: Payer: Self-pay | Admitting: Oncology

## 2023-12-10 ENCOUNTER — Encounter: Payer: Self-pay | Admitting: Family Medicine

## 2023-12-10 DIAGNOSIS — M2550 Pain in unspecified joint: Secondary | ICD-10-CM | POA: Insufficient documentation

## 2023-12-10 DIAGNOSIS — M791 Myalgia, unspecified site: Secondary | ICD-10-CM | POA: Insufficient documentation

## 2023-12-10 NOTE — Assessment & Plan Note (Signed)
 Receives effective Botox  injections, follows with Neurology

## 2023-12-10 NOTE — Assessment & Plan Note (Signed)
 Severe joint pain with intermittent swelling and nodules. Negative tests for rheumatoid arthritis and thyroid  issues. Differential includes seronegative arthritis, tendinitis, or autoimmune disorder. Pain improves with warmth and movement. - Order blood work including CK to assess for muscle involvement due to cholesterol medication. - Consider rheumatology referral if symptoms persist or worsen. - Advise follow-up with primary care for ongoing management.

## 2023-12-10 NOTE — Assessment & Plan Note (Signed)
 Managed by Dr. Seretha Dance. On Coumadin .

## 2023-12-10 NOTE — Assessment & Plan Note (Signed)
 On Lipitor for cholesterol management. Need to rule out muscle-related issues due to cholesterol medication. - Order CK to assess for muscle involvement due to cholesterol medication.

## 2023-12-11 ENCOUNTER — Telehealth: Payer: Self-pay

## 2023-12-11 NOTE — Telephone Encounter (Signed)
 Pharmacy Patient Advocate Encounter   Received notification from CoverMyMeds that prior authorization for Alprazolam  0.5 mg tablets is required/requested.   Insurance verification completed.   The patient is insured through [facility] .   Per test claim: PA required; PA submitted to above mentioned insurance via CoverMyMeds Key/confirmation #/EOC [id number] Status is pending

## 2023-12-12 DIAGNOSIS — D6861 Antiphospholipid syndrome: Secondary | ICD-10-CM | POA: Diagnosis not present

## 2023-12-12 DIAGNOSIS — Z7901 Long term (current) use of anticoagulants: Secondary | ICD-10-CM | POA: Diagnosis not present

## 2023-12-12 LAB — PROTIME-INR: INR: 3.1 — AB (ref 0.80–1.20)

## 2023-12-14 ENCOUNTER — Encounter: Payer: Self-pay | Admitting: Oncology

## 2023-12-14 ENCOUNTER — Encounter: Payer: Self-pay | Admitting: Internal Medicine

## 2023-12-14 MED ORDER — LANTUS SOLOSTAR 100 UNIT/ML ~~LOC~~ SOPN
22.0000 [IU] | PEN_INJECTOR | Freq: Every day | SUBCUTANEOUS | 2 refills | Status: DC
Start: 2023-12-14 — End: 2024-02-15

## 2023-12-14 NOTE — Addendum Note (Signed)
 Addended by: Zingaro Goodpasture on: 12/14/2023 04:10 PM   Modules accepted: Orders

## 2023-12-19 LAB — PROTIME-INR: INR: 3.7 — AB (ref 0.80–1.20)

## 2023-12-20 ENCOUNTER — Telehealth: Payer: Self-pay

## 2023-12-20 ENCOUNTER — Encounter: Payer: Self-pay | Admitting: Oncology

## 2023-12-20 DIAGNOSIS — Z7901 Long term (current) use of anticoagulants: Secondary | ICD-10-CM

## 2023-12-20 NOTE — Telephone Encounter (Signed)
 Per Dr. Randy Buttery "Inr 3.7. any change in diet? Will not make any change in coumadin  today. Lets check next week and if higher will decrease dose".  Outbound call to patient; voice message left on [phone].  Will attempt to contact again shortly.

## 2023-12-21 DIAGNOSIS — Z0289 Encounter for other administrative examinations: Secondary | ICD-10-CM

## 2023-12-21 DIAGNOSIS — Z79891 Long term (current) use of opiate analgesic: Secondary | ICD-10-CM | POA: Diagnosis not present

## 2023-12-21 DIAGNOSIS — G894 Chronic pain syndrome: Secondary | ICD-10-CM | POA: Diagnosis not present

## 2023-12-21 DIAGNOSIS — M62830 Muscle spasm of back: Secondary | ICD-10-CM | POA: Diagnosis not present

## 2023-12-21 DIAGNOSIS — G890 Central pain syndrome: Secondary | ICD-10-CM | POA: Diagnosis not present

## 2023-12-21 NOTE — Telephone Encounter (Signed)
 Patient responded via my chart message "Hi Amanda Vasquez, sorry I missed your call this morning. I know that my INR was higher than normal and that's my own fault. It's dietary because I didn't get my greens in over the last week. I'll do better in the future. I hope you're having a fantastic week! FYI - it's much easier to reach me through the text messaging here on MyChart."  No further follow up needed at this time.

## 2023-12-26 LAB — PROTIME-INR: INR: 3 — AB (ref 0.80–1.20)

## 2023-12-27 ENCOUNTER — Encounter: Payer: Self-pay | Admitting: Oncology

## 2023-12-28 ENCOUNTER — Other Ambulatory Visit: Payer: Self-pay | Admitting: Nurse Practitioner

## 2023-12-28 DIAGNOSIS — E785 Hyperlipidemia, unspecified: Secondary | ICD-10-CM

## 2024-01-02 LAB — PROTIME-INR: INR: 3.1 — AB (ref 0.80–1.20)

## 2024-01-05 ENCOUNTER — Encounter: Payer: Self-pay | Admitting: Oncology

## 2024-01-08 NOTE — Telephone Encounter (Signed)
 Pharmacy Patient Advocate Encounter  Received notification from [employer] that Prior Authorization for ALPRAZolam  0.5MG  tablets has been APPROVED from 12/11/2023 to 08/21/2024   PA #/Case ID/Reference #: [id number]

## 2024-01-09 DIAGNOSIS — D6861 Antiphospholipid syndrome: Secondary | ICD-10-CM | POA: Diagnosis not present

## 2024-01-09 DIAGNOSIS — Z7901 Long term (current) use of anticoagulants: Secondary | ICD-10-CM | POA: Diagnosis not present

## 2024-01-09 LAB — PROTIME-INR: INR: 2.7 — AB (ref 0.80–1.20)

## 2024-01-12 ENCOUNTER — Other Ambulatory Visit: Payer: Self-pay | Admitting: Nurse Practitioner

## 2024-01-12 DIAGNOSIS — G43711 Chronic migraine without aura, intractable, with status migrainosus: Secondary | ICD-10-CM

## 2024-01-17 ENCOUNTER — Other Ambulatory Visit: Payer: Self-pay | Admitting: Internal Medicine

## 2024-01-17 ENCOUNTER — Other Ambulatory Visit: Payer: Self-pay | Admitting: Oncology

## 2024-01-17 DIAGNOSIS — G894 Chronic pain syndrome: Secondary | ICD-10-CM | POA: Diagnosis not present

## 2024-01-17 DIAGNOSIS — Z79891 Long term (current) use of opiate analgesic: Secondary | ICD-10-CM | POA: Diagnosis not present

## 2024-01-17 DIAGNOSIS — E1165 Type 2 diabetes mellitus with hyperglycemia: Secondary | ICD-10-CM

## 2024-01-17 DIAGNOSIS — G890 Central pain syndrome: Secondary | ICD-10-CM | POA: Diagnosis not present

## 2024-01-17 DIAGNOSIS — M62830 Muscle spasm of back: Secondary | ICD-10-CM | POA: Diagnosis not present

## 2024-01-17 LAB — PROTIME-INR: INR: 1.9 — AB (ref 0.80–1.20)

## 2024-01-18 ENCOUNTER — Encounter: Payer: Self-pay | Admitting: Oncology

## 2024-01-23 ENCOUNTER — Other Ambulatory Visit: Payer: Self-pay | Admitting: Internal Medicine

## 2024-01-23 DIAGNOSIS — E1169 Type 2 diabetes mellitus with other specified complication: Secondary | ICD-10-CM

## 2024-01-23 LAB — PROTIME-INR: INR: 2.9 — AB (ref 0.80–1.20)

## 2024-01-26 ENCOUNTER — Telehealth: Payer: Self-pay | Admitting: *Deleted

## 2024-01-26 DIAGNOSIS — Z7901 Long term (current) use of anticoagulants: Secondary | ICD-10-CM

## 2024-01-26 NOTE — Telephone Encounter (Signed)
 Tammy from [facility] office.  Tammy states that she needs an extraction of a tooth and they wanted know if you need to stop the blood thinner and if she should have an INR before getting the extraction. I assume need letter for all things that she might have to do.

## 2024-01-26 NOTE — Telephone Encounter (Addendum)
 Per Dr. Randy Buttery "We can have her check inr 1 day prior but no need to hold coumadin . Local hemostatic agents should work after the extraction".  Outbound call, spoke to [facility] informed of above.  Informed will contact patient to schedule INR lab only appointment; per Tammy the tooth extraction has not been scheduled yet.  Also informed Tammy I can fax a dental clearance letter to their office; fax # [phone]

## 2024-01-29 ENCOUNTER — Encounter: Payer: Self-pay | Admitting: Oncology

## 2024-01-29 NOTE — Telephone Encounter (Signed)
 Per Jacqlyn Matas "she messaged me back and I've got her scheduled for labs this Wednesday at 130".

## 2024-01-29 NOTE — Telephone Encounter (Signed)
 Dental clearance letter faxed to dentist; confirmation received.

## 2024-01-29 NOTE — Telephone Encounter (Signed)
 Per Dr. Randy Buttery "It is okay for her to check INR the day before procedure at home".

## 2024-01-29 NOTE — Telephone Encounter (Signed)
 it is okay for her to check INR the day before procedure at home

## 2024-01-29 NOTE — Telephone Encounter (Signed)
 Per Dr. Randy Buttery "We can have her check inr 1 day prior but no need to hold coumadin . Local hemostatic agents should work after the extraction".

## 2024-01-30 NOTE — Telephone Encounter (Signed)
 Message sent via my chart indicating Dr. Randy Buttery stating It is okay for her to check INR the day before procedure at home.  Patient is scheduled for a LOV tomorrow 01/31/24; asked if she would like to cancel that lab only appointment.  Waiting on hearing back from patient.

## 2024-01-31 ENCOUNTER — Inpatient Hospital Stay

## 2024-02-01 ENCOUNTER — Encounter: Payer: Self-pay | Admitting: Oncology

## 2024-02-01 LAB — PROTIME-INR: INR: 2.6 — AB (ref 0.80–1.20)

## 2024-02-06 ENCOUNTER — Other Ambulatory Visit: Payer: Self-pay | Admitting: Nurse Practitioner

## 2024-02-06 DIAGNOSIS — D6861 Antiphospholipid syndrome: Secondary | ICD-10-CM | POA: Diagnosis not present

## 2024-02-06 DIAGNOSIS — F32A Depression, unspecified: Secondary | ICD-10-CM

## 2024-02-06 DIAGNOSIS — Z7901 Long term (current) use of anticoagulants: Secondary | ICD-10-CM | POA: Diagnosis not present

## 2024-02-06 LAB — PROTIME-INR: INR: 3 — AB (ref 0.80–1.20)

## 2024-02-08 ENCOUNTER — Telehealth: Payer: Self-pay

## 2024-02-08 NOTE — Telephone Encounter (Signed)
 Left message to call the office back to find out where she would like to have her Mammogram done so we can order it.

## 2024-02-09 ENCOUNTER — Ambulatory Visit: Admitting: Internal Medicine

## 2024-02-09 ENCOUNTER — Encounter: Payer: Self-pay | Admitting: Oncology

## 2024-02-09 ENCOUNTER — Encounter: Payer: Self-pay | Admitting: Internal Medicine

## 2024-02-09 VITALS — BP 124/70 | HR 86 | Ht 65.0 in | Wt 246.8 lb

## 2024-02-09 DIAGNOSIS — Z794 Long term (current) use of insulin: Secondary | ICD-10-CM | POA: Diagnosis not present

## 2024-02-09 DIAGNOSIS — Z7984 Long term (current) use of oral hypoglycemic drugs: Secondary | ICD-10-CM | POA: Diagnosis not present

## 2024-02-09 DIAGNOSIS — E1165 Type 2 diabetes mellitus with hyperglycemia: Secondary | ICD-10-CM

## 2024-02-09 DIAGNOSIS — Z6836 Body mass index (BMI) 36.0-36.9, adult: Secondary | ICD-10-CM | POA: Diagnosis not present

## 2024-02-09 DIAGNOSIS — E785 Hyperlipidemia, unspecified: Secondary | ICD-10-CM | POA: Diagnosis not present

## 2024-02-09 DIAGNOSIS — E66812 Obesity, class 2: Secondary | ICD-10-CM | POA: Diagnosis not present

## 2024-02-09 LAB — POCT GLYCOSYLATED HEMOGLOBIN (HGB A1C): Hemoglobin A1C: 8.6 % — AB (ref 4.0–5.6)

## 2024-02-09 MED ORDER — GLIPIZIDE 5 MG PO TABS: 5.0000 mg | ORAL_TABLET | Freq: Every day | ORAL | 3 refills | Status: AC
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
## 2024-02-09 NOTE — Patient Instructions (Addendum)
 Please continue: - Metformin  1000 mg 2x a day, with meals - Farxiga  10 mg daily in a.m. - Lantus  33 units at bedtime  Start: - Glipizide  5 mg before dinner.   Please return in 3-4 months.

## 2024-02-09 NOTE — Addendum Note (Signed)
 Addended by: Leclaire Goodpasture on: 02/09/2024 04:43 PM   Modules accepted: Orders

## 2024-02-12 ENCOUNTER — Encounter (INDEPENDENT_AMBULATORY_CARE_PROVIDER_SITE_OTHER): Payer: Self-pay

## 2024-02-13 LAB — PROTIME-INR: INR: 3.2 — AB (ref 0.80–1.20)

## 2024-02-14 ENCOUNTER — Ambulatory Visit (INDEPENDENT_AMBULATORY_CARE_PROVIDER_SITE_OTHER): Admitting: Family Medicine

## 2024-02-14 ENCOUNTER — Encounter (INDEPENDENT_AMBULATORY_CARE_PROVIDER_SITE_OTHER): Payer: Self-pay | Admitting: Family Medicine

## 2024-02-14 ENCOUNTER — Encounter: Payer: Self-pay | Admitting: Internal Medicine

## 2024-02-14 VITALS — BP 122/76 | HR 81 | Temp 98.2°F | Ht 64.0 in | Wt 239.0 lb

## 2024-02-14 DIAGNOSIS — F909 Attention-deficit hyperactivity disorder, unspecified type: Secondary | ICD-10-CM

## 2024-02-14 DIAGNOSIS — E1169 Type 2 diabetes mellitus with other specified complication: Secondary | ICD-10-CM | POA: Diagnosis not present

## 2024-02-14 DIAGNOSIS — Z1331 Encounter for screening for depression: Secondary | ICD-10-CM

## 2024-02-14 DIAGNOSIS — E785 Hyperlipidemia, unspecified: Secondary | ICD-10-CM | POA: Diagnosis not present

## 2024-02-14 DIAGNOSIS — R0602 Shortness of breath: Secondary | ICD-10-CM

## 2024-02-14 DIAGNOSIS — Z7984 Long term (current) use of oral hypoglycemic drugs: Secondary | ICD-10-CM | POA: Diagnosis not present

## 2024-02-14 DIAGNOSIS — E119 Type 2 diabetes mellitus without complications: Secondary | ICD-10-CM

## 2024-02-14 DIAGNOSIS — Z794 Long term (current) use of insulin: Secondary | ICD-10-CM

## 2024-02-14 DIAGNOSIS — D6861 Antiphospholipid syndrome: Secondary | ICD-10-CM | POA: Diagnosis not present

## 2024-02-14 DIAGNOSIS — Z6841 Body Mass Index (BMI) 40.0 and over, adult: Secondary | ICD-10-CM

## 2024-02-14 DIAGNOSIS — Z7985 Long-term (current) use of injectable non-insulin antidiabetic drugs: Secondary | ICD-10-CM | POA: Diagnosis not present

## 2024-02-14 DIAGNOSIS — E559 Vitamin D deficiency, unspecified: Secondary | ICD-10-CM | POA: Diagnosis not present

## 2024-02-14 DIAGNOSIS — R5383 Other fatigue: Secondary | ICD-10-CM

## 2024-02-14 NOTE — Assessment & Plan Note (Signed)
 Patient interested in pursuing a provider specific for ADHD.  She has a psychiatric provider that prescribes other psych meds.  She is going to call [facility] to set up appointment with provider there for medication management.

## 2024-02-14 NOTE — Assessment & Plan Note (Signed)
 On warfarin daily and gets INR monitored.  Patient told to let her managing provider know she is going to start a dietary change.

## 2024-02-14 NOTE — Progress Notes (Signed)
 Chief Complaint:  Obesity   Subjective:  Amanda Vasquez (MR# [id number]) is a 53 y.o. female who presents for evaluation and treatment of obesity and related comorbidities.   Amanda Vasquez is currently in the action stage of change and ready to dedicate time achieving and maintaining a healthier weight. Amanda Vasquez is interested in becoming our patient and working on intensive lifestyle modifications including (but not limited to) diet and exercise for weight loss.  Amanda Vasquez has been struggling with her weight. She has been unsuccessful in either losing weight, maintaining weight loss, or reaching her healthy weight goal.  Returning patient.  Patient has regained weight she lost previously- last reported weight at [facility] of 167lbs.  Lives at home with her two dogs, next door to her parents and across street from her brother.  She tries to avoid meat but will try to incorporate some for more protein.   Often skips lunch.  Food Recall:  She gets up between 4-5am and may go back to bed.  Tends to graze and feels like she doesn't give herself the opportunity for her blood sugar to come down.  Worst habit is snacking and watching tv in bed. Yogurt (Lite N fit) then a few hours later will eat something else.  Then will eat again at 7pm.  She does eat quite a bit of tofu.  Indirect Calorimeter completed today shows a No data recorded  .  Her calculated basal metabolic rate is 8341 kcal thus her basal metabolic rate is better than expected.  Other Fatigue Amanda Vasquez admits to daytime somnolence and admits to waking up still tired. Patient has a history of symptoms of daytime fatigue, morning fatigue, and morning headache. Amanda Vasquez generally gets 8 or 9 hours of sleep per night, and states that she has nightime awakenings. Snoring is present. Apneic episodes are not present. Epworth Sleepiness Score is 16.   Shortness of Breath Ane notes increasing shortness of breath with exercising and seems to be worsening over time with weight gain.  She notes getting out of breath sooner with activity than she used to. This has not gotten worse recently. Zya denies shortness of breath at rest or orthopnea.  Depression Screen Amanda Vasquez's Food and Mood (modified PHQ-9) score was 16.     02/14/2024   10:00 AM  Depression screen PHQ 2/9  Decreased Interest 2  Down, Depressed, Hopeless 1  PHQ - 2 Score 3  Altered sleeping 1  Tired, decreased energy 3  Change in appetite 2  Feeling bad or failure about yourself  1  Trouble concentrating 3  Moving slowly or fidgety/restless 3  Suicidal thoughts 0  PHQ-9 Score 16  Difficult doing work/chores Very difficult     Objective:  No data recorded       02/14/2024    9:00 AM 02/09/2024    2:52 PM 11/30/2023   11:47 AM  Vitals with BMI  Height 5' 4 5' 5 5' 5  Weight 239 lbs 246 lbs 13 oz 242 lbs 2 oz  BMI 41 41.07 40.29  Systolic 122 124 877  Diastolic 76 70 68  Pulse 81 86 74     EKG: Normal sinus rhythm  General: Cooperative, alert, well developed, in no acute distress. HEENT: Conjunctivae and lids unremarkable. Cardiovascular: Regular rhythm.  Lungs: Normal work of breathing. Neurologic: No focal deficits.   Lab Results  Component Value Date   CREATININE 0.85 02/14/2024   BUN 13 02/14/2024   NA 139 02/14/2024   K 4.1 02/14/2024  CL 101 02/14/2024   CO2 20 02/14/2024   Lab Results  Component Value Date   ALT 20 02/14/2024   AST 25 02/14/2024   ALKPHOS 75 02/14/2024   BILITOT 0.3 02/14/2024   Lab Results  Component Value Date   HGBA1C 8.6 (A) 02/09/2024   HGBA1C 7.6 (A) 08/01/2023   HGBA1C 7.6 (A) 04/27/2023   HGBA1C 9.3 (A) 12/29/2022   HGBA1C 6.8 (A) 08/09/2022   Lab Results  Component Value Date   INSULIN  24.3 06/01/2020   Lab Results  Component Value Date   TSH 3.180 02/14/2024   Lab Results  Component Value Date   CHOL 135 02/14/2024   HDL 61 02/14/2024   LDLCALC 49 02/14/2024   LDLDIRECT 80.0 09/19/2018   TRIG 145 02/14/2024   CHOLHDL  2.2 02/14/2024   Lab Results  Component Value Date   WBC 6.4 02/14/2024   HGB 12.1 02/14/2024   HCT 39.0 02/14/2024   MCV 86 02/14/2024   PLT 292 02/14/2024   Lab Results  Component Value Date   IRON 65 05/06/2010    Assessment and Plan:   Other Fatigue  Amanda Vasquez does feel that her weight is causing her energy to be lower than it should be. Fatigue may be related to obesity, depression or many other causes. Labs will be ordered, and in the meanwhile, Amanda Vasquez will focus on self care including making healthy food choices, increasing physical activity and focusing on stress reduction.  Shortness of Breath  Amanda Vasquez does feel that she gets out of breath more easily that she used to when she exercises. Amanda Vasquez's shortness of breath appears to be obesity related and exercise induced. She has agreed to work on weight loss and gradually increase exercise to treat her exercise induced shortness of breath. Will continue to monitor closely.   Problem List Items Addressed This Visit       Endocrine   Diabetes mellitus, type II ([name])   Recent A1c of 8.6- previously elevated then controlled when at [facility].  On lantus , glipizide , farxiga  and metformin .  Had DKA with ozempic .  Needs c-peptide today and urine microalbumin.        Relevant Orders   Vitamin B12 (Completed)   Comprehensive metabolic panel with GFR (Completed)   C-peptide (Completed)   Urine Microalbumin w/creat. ratio   Hyperlipidemia associated with type 2 diabetes mellitus ([name])   On lipitor daily with no side effects mentioned.  Needs a repeat FLP today.        Relevant Orders   Lipid panel (Completed)     Hematopoietic and Hemostatic   Anti-phospholipid antibody syndrome ([name])   On warfarin daily and gets INR monitored.  Patient told to let her managing provider know she is going to start a dietary change.      Relevant Orders   CBC with Differential/Platelet (Completed)     Other   Fatigue - Primary   Relevant Orders   EKG  12-Lead (Completed)   Folate (Completed)   T3 (Completed)   T4, free (Completed)   TSH (Completed)   Vitamin D  deficiency   Previous diagnosis.  On 2k international units daily.  Needs a repeat level today.       Relevant Orders   VITAMIN D  25 Hydroxy (Vit-D Deficiency, Fractures) (Completed)   Attention deficit hyperactivity disorder (ADHD)   Patient interested in pursuing a provider specific for ADHD.  She has a psychiatric provider that prescribes other psych meds.  She is going to call  [facility] to set up appointment with provider there for medication management.      Morbid obesity ([name])   Anthropometric Measurements Height: 5' 4 (1.626 m) Weight: 239 lb (108.4 kg) BMI (Calculated): 41 Starting Weight: 239 lb Waist Measurement : 47 inches Body Composition  Body Fat %: 52.2 % Fat Mass (lbs): 125 lbs Muscle Mass (lbs): 108.8 lbs Total Body Water (lbs): 89.4 lbs Visceral Fat Rating : 16 Other Clinical Data RMR: 2030 Fasting: yes Labs: yes Today's Visit #: 1 Starting Date: 02/14/24 Comments: 1st Visit       Other Visit Diagnoses       SOBOE (shortness of breath on exertion)         BMI 40.0-44.9, adult ([name])           Amanda Vasquez is currently in the action stage of change and her goal is to continue with weight loss efforts. I recommend Amanda Vasquez begin the structured treatment plan as follows:  She has agreed to [employer]  Exercise goals: All adults should avoid inactivity. Some activity is better than none, and adults who participate in any amount of physical activity, gain some health benefits.  Behavioral modification strategies:increasing lean protein intake, keeping healthy foods in the home, ways to avoid nighttime snacking, better snacking choices, avoiding temptations, and planning for success  She was informed of the importance of frequent follow-up visits to maximize her success with intensive lifestyle modifications for her multiple health conditions. She  was informed we would discuss her lab results at her next visit unless there is a critical issue that needs to be addressed sooner. Amanda Vasquez agreed to keep her next visit at the agreed upon time to discuss these results.  Labs ordered with plans to discuss at the next visit.   Attestation Statements:  Reviewed by clinician on day of visit: allergies, medications, problem list, medical history, surgical history, family history, social history, and previous encounter notes. This is the patient's first visit at [facility]. The patient's NEW PATIENT PACKET was reviewed at length. Included in the packet: current and past health history, medications, allergies, ROS, gynecologic history (women only), surgical history, family history, social history, weight history, weight loss surgery history (for those that have had weight loss surgery), nutritional evaluation, mood and food questionnaire, PHQ9, Epworth questionnaire, sleep habits questionnaire, patient life and health improvement goals questionnaire. These will all be scanned into the patient's chart under media.   During the visit, I independently reviewed the patient's EKG, bioimpedance scale results, and indirect calorimeter results. I used this information to tailor a meal plan for the patient that will help her to lose weight and will improve her obesity-related conditions going forward. I performed a medically necessary appropriate examination and/or evaluation. I discussed the assessment and treatment plan with the patient. The patient was provided an opportunity to ask questions and all were answered. The patient agreed with the plan and demonstrated an understanding of the instructions. Labs were ordered at this visit and will be reviewed at the next visit unless more critical results need to be addressed immediately. Clinical information was updated and documented in the EMR.     Amanda Vasquez Cho, MD

## 2024-02-14 NOTE — Assessment & Plan Note (Signed)
 Previous diagnosis.  On 2k international units daily.  Needs a repeat level today.

## 2024-02-14 NOTE — Assessment & Plan Note (Signed)
 On lipitor daily with no side effects mentioned.  Needs a repeat FLP today.

## 2024-02-14 NOTE — Assessment & Plan Note (Signed)
 Recent A1c of 8.6- previously elevated then controlled when at [facility].  On lantus , glipizide , farxiga  and metformin .  Had DKA with ozempic .  Needs c-peptide today and urine microalbumin.

## 2024-02-15 ENCOUNTER — Encounter: Payer: Self-pay | Admitting: Oncology

## 2024-02-15 LAB — CBC WITH DIFFERENTIAL/PLATELET
Basophils Absolute: 0 10*3/uL (ref 0.0–0.2)
Basos: 1 %
EOS (ABSOLUTE): 0.3 10*3/uL (ref 0.0–0.4)
Eos: 5 %
Hematocrit: 39 % (ref 34.0–46.6)
Hemoglobin: 12.1 g/dL (ref 11.1–15.9)
Immature Grans (Abs): 0 10*3/uL (ref 0.0–0.1)
Immature Granulocytes: 0 %
Lymphocytes Absolute: 2.4 10*3/uL (ref 0.7–3.1)
Lymphs: 38 %
MCH: 26.8 pg (ref 26.6–33.0)
MCHC: 31 g/dL — ABNORMAL LOW (ref 31.5–35.7)
MCV: 86 fL (ref 79–97)
Monocytes Absolute: 0.4 10*3/uL (ref 0.1–0.9)
Monocytes: 6 %
Neutrophils Absolute: 3.2 10*3/uL (ref 1.4–7.0)
Neutrophils: 50 %
Platelets: 292 10*3/uL (ref 150–450)
RBC: 4.52 x10E6/uL (ref 3.77–5.28)
RDW: 14.8 % (ref 11.7–15.4)
WBC: 6.4 10*3/uL (ref 3.4–10.8)

## 2024-02-15 LAB — COMPREHENSIVE METABOLIC PANEL WITH GFR
ALT: 20 IU/L (ref 0–32)
AST: 25 IU/L (ref 0–40)
Albumin: 4.3 g/dL (ref 3.8–4.9)
Alkaline Phosphatase: 75 IU/L (ref 44–121)
BUN/Creatinine Ratio: 15 (ref 9–23)
BUN: 13 mg/dL (ref 6–24)
Bilirubin Total: 0.3 mg/dL (ref 0.0–1.2)
CO2: 20 mmol/L (ref 20–29)
Calcium: 9.5 mg/dL (ref 8.7–10.2)
Chloride: 101 mmol/L (ref 96–106)
Creatinine, Ser: 0.85 mg/dL (ref 0.57–1.00)
Globulin, Total: 2.6 g/dL (ref 1.5–4.5)
Glucose: 83 mg/dL (ref 70–99)
Potassium: 4.1 mmol/L (ref 3.5–5.2)
Sodium: 139 mmol/L (ref 134–144)
Total Protein: 6.9 g/dL (ref 6.0–8.5)
eGFR: 82 mL/min/{1.73_m2} (ref >59)

## 2024-02-15 LAB — FOLATE: Folate: 11.3 ng/mL (ref >3.0)

## 2024-02-15 LAB — LIPID PANEL
Chol/HDL Ratio: 2.2 ratio (ref 0.0–4.4)
Cholesterol, Total: 135 mg/dL (ref 100–199)
HDL: 61 mg/dL (ref >39)
LDL Chol Calc (NIH): 49 mg/dL (ref 0–99)
Triglycerides: 145 mg/dL (ref 0–149)
VLDL Cholesterol Cal: 25 mg/dL (ref 5–40)

## 2024-02-15 LAB — C-PEPTIDE: C-Peptide: 1.5 ng/mL (ref 1.1–4.4)

## 2024-02-15 LAB — T4, FREE: Free T4: 1.29 ng/dL (ref 0.82–1.77)

## 2024-02-15 LAB — VITAMIN B12: Vitamin B-12: 291 pg/mL (ref 232–1245)

## 2024-02-15 LAB — T3: T3, Total: 162 ng/dL (ref 71–180)

## 2024-02-15 LAB — TSH: TSH: 3.18 u[IU]/mL (ref 0.450–4.500)

## 2024-02-15 LAB — VITAMIN D 25 HYDROXY (VIT D DEFICIENCY, FRACTURES): Vit D, 25-Hydroxy: 38.5 ng/mL (ref 30.0–100.0)

## 2024-02-15 MED ORDER — LANTUS SOLOSTAR 100 UNIT/ML ~~LOC~~ SOPN: 33.0000 [IU] | PEN_INJECTOR | Freq: Every day | SUBCUTANEOUS | 3 refills | Status: AC

## 2024-02-19 ENCOUNTER — Ambulatory Visit: Payer: Self-pay

## 2024-02-19 NOTE — Telephone Encounter (Signed)
 FYI Only or Action Required?: Action required by provider: request for appointment.  Patient was last seen in primary care on 02/14/2024 by Amanda Adelita PENNER, MD. Called Nurse Triage reporting ADHD. Symptoms began several weeks ago. Interventions attempted: Rest, hydration, or home remedies. Symptoms are: unchanged.  Triage Disposition: See PCP When Office is Open (Within 3 Days)-needs a phone call in regards to scheduling an appointment  Patient/caregiver understands and will follow disposition?: No, wishes to speak with PCP  Copied from CRM [id number]. Topic: Clinical - Red Word Triage >> Feb 19, 2024  2:46 PM [name] wrote: [facility] that prompted transfer to Nurse Triage: Worsening ADHD, patient needs to schedule an appointment.   ----------------------------------------------------------------------- From previous Reason for Contact - Scheduling: Patient/patient representative is calling to schedule an appointment. Refer to attachments for appointment information. Reason for Disposition  MODERATE anxiety (e.g., persistent or frequent anxiety symptoms; interferes with sleep, school, or work)  Answer Assessment - Initial Assessment Questions 1. CONCERN: Did anything happen that prompted you to call today?      Having increased ADHD symptoms.  2. ANXIETY SYMPTOMS: Can you describe how you (your loved one; patient) have been feeling? (e.g., tense, restless, panicky, anxious, keyed up, overwhelmed, sense of impending doom).      Anxious, overwhelmed, difficulty focusing on anything 3. ONSET: How long have you been feeling this way? (e.g., hours, days, weeks)     Six months 4. SEVERITY: How would you rate the level of anxiety? (e.g., 0 - 10; or mild, moderate, severe).     severe 5. FUNCTIONAL IMPAIRMENT: How have these feelings affected your ability to do daily activities? Have you had more difficulty than usual doing your normal daily activities? (e.g., getting better, same,  worse; self-care, school, work, interactions)     Difficulty completing activities throughout the day 6. HISTORY: Have you felt this way before? Have you ever been diagnosed with an anxiety problem in the past? (e.g., generalized anxiety disorder, panic attacks, PTSD). If Yes, ask: How was this problem treated? (e.g., medicines, counseling, etc.)     yes 7. RISK OF HARM - SUICIDAL IDEATION: Do you ever have thoughts of hurting or killing yourself? If Yes, ask:  Do you have these feelings now? Do you have a plan on how you would do this?     no 8. TREATMENT:  What has been done so far to treat this anxiety? (e.g., medicines, relaxation strategies). What has helped?     nothing 9. TREATMENT - THERAPIST: Do you have a counselor or therapist? Name?     N/a 10. POTENTIAL TRIGGERS: Do you drink caffeinated beverages (e.g., coffee, colas, teas), and how much daily? Do you drink alcohol or use any drugs? Have you started any new medicines recently?       Iced tea 11. PATIENT SUPPORT: Who is with you now? Who do you live with? Do you have family or friends who you can talk to?        Lives by herself-family lives next door 12. OTHER SYMPTOMS: Do you have any other symptoms? (e.g., feeling depressed, trouble concentrating, trouble sleeping, trouble breathing, palpitations or fast heartbeat, chest pain, sweating, nausea, or diarrhea)       Trouble concentrating, trouble sleeping  Patient requesting an appointment with PCP to be evaluated for her ADHD and anxiety. Information was started to be given to patient about behavioral health UC in [place] but patient refused and said she would rather be seen in the office.  Needing a phone call for scheduling.  Protocols used: Anxiety and Panic Attack-A-AH

## 2024-02-20 ENCOUNTER — Encounter: Payer: Self-pay | Admitting: Oncology

## 2024-02-20 DIAGNOSIS — G890 Central pain syndrome: Secondary | ICD-10-CM | POA: Diagnosis not present

## 2024-02-20 DIAGNOSIS — G894 Chronic pain syndrome: Secondary | ICD-10-CM | POA: Diagnosis not present

## 2024-02-20 DIAGNOSIS — Z79891 Long term (current) use of opiate analgesic: Secondary | ICD-10-CM | POA: Diagnosis not present

## 2024-02-20 DIAGNOSIS — M62830 Muscle spasm of back: Secondary | ICD-10-CM | POA: Diagnosis not present

## 2024-02-20 LAB — PROTIME-INR: INR: 2.7 — AB (ref 0.80–1.20)

## 2024-02-24 NOTE — Assessment & Plan Note (Signed)
 Anthropometric Measurements Height: 5' 4 (1.626 m) Weight: 239 lb (108.4 kg) BMI (Calculated): 41 Starting Weight: 239 lb Waist Measurement : 47 inches Body Composition  Body Fat %: 52.2 % Fat Mass (lbs): 125 lbs Muscle Mass (lbs): 108.8 lbs Total Body Water (lbs): 89.4 lbs Visceral Fat Rating : 16 Other Clinical Data RMR: 2030 Fasting: yes Labs: yes Today's Visit #: 1 Starting Date: 02/14/24 Comments: 1st Visit

## 2024-02-25 ENCOUNTER — Encounter: Payer: Self-pay | Admitting: Internal Medicine

## 2024-02-28 ENCOUNTER — Encounter: Payer: Self-pay | Admitting: Oncology

## 2024-02-28 ENCOUNTER — Encounter (INDEPENDENT_AMBULATORY_CARE_PROVIDER_SITE_OTHER): Payer: Self-pay | Admitting: Family Medicine

## 2024-02-28 ENCOUNTER — Ambulatory Visit (INDEPENDENT_AMBULATORY_CARE_PROVIDER_SITE_OTHER): Admitting: Family Medicine

## 2024-02-28 VITALS — BP 130/76 | HR 85 | Temp 98.3°F | Ht 64.0 in | Wt 233.0 lb

## 2024-02-28 DIAGNOSIS — E785 Hyperlipidemia, unspecified: Secondary | ICD-10-CM

## 2024-02-28 DIAGNOSIS — Z7984 Long term (current) use of oral hypoglycemic drugs: Secondary | ICD-10-CM

## 2024-02-28 DIAGNOSIS — E1169 Type 2 diabetes mellitus with other specified complication: Secondary | ICD-10-CM | POA: Diagnosis not present

## 2024-02-28 DIAGNOSIS — E559 Vitamin D deficiency, unspecified: Secondary | ICD-10-CM | POA: Diagnosis not present

## 2024-02-28 DIAGNOSIS — Z6839 Body mass index (BMI) 39.0-39.9, adult: Secondary | ICD-10-CM

## 2024-02-28 DIAGNOSIS — Z794 Long term (current) use of insulin: Secondary | ICD-10-CM

## 2024-02-28 LAB — PROTIME-INR: INR: 3.2 — AB (ref 0.80–1.20)

## 2024-02-28 MED ORDER — VITAMIN D 50 MCG (2000 UT) PO CAPS: 2.0000 | ORAL_CAPSULE | Freq: Every day | ORAL | Status: AC

## 2024-02-28 NOTE — Assessment & Plan Note (Signed)
 Discussed importance of vitamin d supplementation.  Vitamin d supplementation has been shown to decrease fatigue, decrease risk of progression to insulin resistance and then prediabetes, decreases risk of falling in older age and can even assist in decreasing depressive symptoms in PTSD.   Prescription for Vitamin D sent in.

## 2024-02-28 NOTE — Assessment & Plan Note (Signed)
 On lipitor daily.  No myalgias.  LDL was at goal as was HDL and Triglycerides.

## 2024-02-28 NOTE — Assessment & Plan Note (Addendum)
 Patient has Dexcom. 30 day average 144 and estimated GMI of 6.8%.  14 day average 138 and 6.6% and 7 day average 135 with GMI 6.5%. She did have one hypoglycemic event at 41 in the middle of the night a few days ago. She isn't taking Lantus  at this time.  She is still taking glipizide  5mg  daily.  She sees Dr[facility] Trixie at [facility].  Patient to continue to monitor blood sugar with CGM.

## 2024-02-28 NOTE — Progress Notes (Unsigned)
   SUBJECTIVE:  Chief Complaint: Obesity  Interim History: Patient mentions she had a good first two weeks second time around.  No issues with following Pescatarian diet.  She got in plenty of fluids.  She was never hungry before and isn't hungry now.  Her baseline is always thinking about what she will eat next.  No plans to go anywhere in the next few weeks. Denies needing to make any changes to the meal plan.  Amanda Vasquez is here to discuss her progress with her obesity treatment plan. She is on the [employer] and states she is following her eating plan approximately 85-90 % of the time. She states she is working outside in the yard.   OBJECTIVE: Visit Diagnoses: Problem List Items Addressed This Visit   None   Vitals Temp: 98.3 F (36.8 C) BP: 130/76 Pulse Rate: 85 SpO2: 97 %   Anthropometric Measurements Height: 5' 4 (1.626 m) Weight: 233 lb (105.7 kg) BMI (Calculated): 39.97 Weight at Last Visit: 239 lb Weight Lost Since Last Visit: 6 lb Starting Weight: 239 lb Total Weight Loss (lbs): 6 lb (2.722 kg)   Body Composition  Body Fat %: 46.9 % Fat Mass (lbs): 109.4 lbs Muscle Mass (lbs): 117.8 lbs Total Body Water (lbs): 91 lbs Visceral Fat Rating : 14   Other Clinical Data Today's Visit #: 2 Starting Date: 02/14/24 Comments: [facility]     ASSESSMENT AND PLAN:  Diet: Amanda Vasquez is currently in the action stage of change. As such, her goal is to continue with weight loss efforts and has agreed to the [employer].   Exercise:  For substantial health benefits, adults should do at least 150 minutes (2 hours and 30 minutes) a week of moderate-intensity, or 75 minutes (1 hour and 15 minutes) a week of vigorous-intensity aerobic physical activity, or an equivalent combination of moderate- and vigorous-intensity aerobic activity. Aerobic activity should be performed in episodes of at least 10 minutes, and preferably, it should be spread throughout the week.  Behavior  Modification:  We discussed the following Behavioral Modification Strategies today: increasing lean protein intake, decreasing simple carbohydrates, increasing vegetables, meal planning and cooking strategies, and planning for success.   No follow-ups on file.   She was informed of the importance of frequent follow up visits to maximize her success with intensive lifestyle modifications for her multiple health conditions.  Attestation Statements:   Reviewed by clinician on day of visit: allergies, medications, problem list, medical history, surgical history, family history, social history, and previous encounter notes.     Adelita Cho, MD

## 2024-02-29 NOTE — Telephone Encounter (Signed)
**Note De-identified  [place] Obfuscation** Please advise 

## 2024-03-01 ENCOUNTER — Ambulatory Visit: Admitting: Physician Assistant
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
## 2024-03-05 ENCOUNTER — Ambulatory Visit: Attending: Physician Assistant | Admitting: Physician Assistant

## 2024-03-05 ENCOUNTER — Encounter: Payer: Self-pay | Admitting: Physician Assistant

## 2024-03-05 VITALS — BP 130/78 | HR 87 | Ht 65.0 in | Wt 233.2 lb

## 2024-03-05 DIAGNOSIS — Z86711 Personal history of pulmonary embolism: Secondary | ICD-10-CM

## 2024-03-05 DIAGNOSIS — R0609 Other forms of dyspnea: Secondary | ICD-10-CM | POA: Diagnosis not present

## 2024-03-05 DIAGNOSIS — D6861 Antiphospholipid syndrome: Secondary | ICD-10-CM

## 2024-03-05 DIAGNOSIS — E785 Hyperlipidemia, unspecified: Secondary | ICD-10-CM | POA: Diagnosis not present

## 2024-03-05 DIAGNOSIS — I25118 Atherosclerotic heart disease of native coronary artery with other forms of angina pectoris: Secondary | ICD-10-CM | POA: Diagnosis not present

## 2024-03-05 NOTE — Patient Instructions (Signed)
 Medication Instructions:  Your physician recommends that you continue on your current medications as directed. Please refer to the Current Medication list given to you today.   *If you need a refill on your cardiac medications before your next appointment, please call your pharmacy*  Lab Work: None ordered at this time  If you have labs (blood work) drawn today and your tests are completely normal, you will receive your results only by: MyChart Message (if you have MyChart) OR A paper copy in the mail If you have any lab test that is abnormal or we need to change your treatment, we will call you to review the results.  Testing/Procedures: Your physician has requested that you have an echocardiogram. Echocardiography is a painless test that uses sound waves to create images of your heart. It provides your doctor with information about the size and shape of your heart and how well your heart's chambers and valves are working.   You may receive an ultrasound enhancing agent through an IV if needed to better visualize your heart during the echo. This procedure takes approximately one hour.  There are no restrictions for this procedure.  This will take place at [address] [facility]) [address], [place] [address]  Please note: We ask at that you not bring children with you during ultrasound (echo/ vascular) testing. Due to room size and safety concerns, children are not allowed in the ultrasound rooms during exams. Our front office staff cannot provide observation of children in our lobby area while testing is being conducted. An adult accompanying a patient to their appointment will only be allowed in the ultrasound room at the discretion of the ultrasound technician under special circumstances. We apologize for any inconvenience.  Your provider has ordered a Lexiscan/ Exercise Myoview Stress test. This will take place at [facility]. Please report to the [facility] medical mall entrance. The  volunteers at the first desk will direct you where to go.  [facility] MYOVIEW  Your provider has ordered a Stress Test with nuclear imaging. The purpose of this test is to evaluate the blood supply to your heart muscle. This procedure is referred to as a Non-Invasive Stress Test. This is because other than having an IV started in your vein, nothing is inserted or invades your body. Cardiac stress tests are done to find areas of poor blood flow to the heart by determining the extent of coronary artery disease (CAD). Some patients exercise on a treadmill, which naturally increases the blood flow to your heart, while others who are unable to walk on a treadmill due to physical limitations will have a pharmacologic/chemical stress agent called Lexiscan . This medicine will mimic walking on a treadmill by temporarily increasing your coronary blood flow.   Please note: these test may take anywhere between 2-4 hours to complete  How to prepare for your Myoview test:  Nothing to eat for 6 hours prior to the test No caffeine for 24 hours prior to test No smoking 24 hours prior to test. Your medication may be taken with water.  If your doctor stopped a medication because of this test, do not take that medication. Ladies, please do not wear dresses.  Skirts or pants are appropriate. Please wear a short sleeve shirt. No perfume, cologne or lotion. Wear comfortable walking shoes. No heels!   PLEASE NOTIFY THE OFFICE AT LEAST 24 HOURS IN ADVANCE IF YOU ARE UNABLE TO KEEP YOUR APPOINTMENT.  [phone] AND  PLEASE NOTIFY NUCLEAR MEDICINE AT  [facility] AT LEAST 24 HOURS IN ADVANCE IF YOU ARE UNABLE TO KEEP YOUR APPOINTMENT. [phone]   Follow-Up: At [facility], you and your health needs are our priority.  As part of our continuing mission to provide you with exceptional heart care, our providers are all part of one team.  This team includes your primary Cardiologist (physician) and Advanced Practice  Providers or APPs (Physician Assistants and Nurse Practitioners) who all work together to provide you with the care you need, when you need it.  Your next appointment:   2 month(s)  Provider:   You may see Deatrice Cage, MD or Bernardino Bring, PA-C

## 2024-03-06 DIAGNOSIS — Z7901 Long term (current) use of anticoagulants: Secondary | ICD-10-CM | POA: Diagnosis not present

## 2024-03-06 DIAGNOSIS — D6861 Antiphospholipid syndrome: Secondary | ICD-10-CM | POA: Diagnosis not present

## 2024-03-06 LAB — PROTIME-INR: INR: 2.7 — AB (ref 0.80–1.20)

## 2024-03-07 ENCOUNTER — Encounter: Payer: Self-pay | Admitting: Oncology

## 2024-03-12 LAB — PROTIME-INR: INR: 3.3 — AB (ref 0.80–1.20)

## 2024-03-13 ENCOUNTER — Encounter: Payer: Self-pay | Admitting: Oncology

## 2024-03-13 ENCOUNTER — Ambulatory Visit (INDEPENDENT_AMBULATORY_CARE_PROVIDER_SITE_OTHER): Admitting: Nurse Practitioner

## 2024-03-13 VITALS — BP 122/70 | HR 71 | Temp 98.5°F | Ht 65.0 in | Wt 227.2 lb

## 2024-03-13 DIAGNOSIS — F909 Attention-deficit hyperactivity disorder, unspecified type: Secondary | ICD-10-CM

## 2024-03-13 DIAGNOSIS — Z794 Long term (current) use of insulin: Secondary | ICD-10-CM | POA: Diagnosis not present

## 2024-03-13 DIAGNOSIS — E1169 Type 2 diabetes mellitus with other specified complication: Secondary | ICD-10-CM

## 2024-03-13 DIAGNOSIS — I2510 Atherosclerotic heart disease of native coronary artery without angina pectoris: Secondary | ICD-10-CM | POA: Diagnosis not present

## 2024-03-13 DIAGNOSIS — E119 Type 2 diabetes mellitus without complications: Secondary | ICD-10-CM

## 2024-03-13 DIAGNOSIS — E785 Hyperlipidemia, unspecified: Secondary | ICD-10-CM

## 2024-03-13 DIAGNOSIS — E669 Obesity, unspecified: Secondary | ICD-10-CM | POA: Diagnosis not present

## 2024-03-13 MED ORDER — ATOMOXETINE HCL 40 MG PO CAPS: 40.0000 mg | ORAL_CAPSULE | Freq: Every day | ORAL | 0 refills | Status: DC

## 2024-03-13 NOTE — Progress Notes (Signed)
 Leron Glance, NP-C Phone: [phone]  Amanda Vasquez is a 53 y.o. female who presents today for ADHD management.  Discussed the use of AI scribe software for clinical note transcription with the patient, who gave verbal consent to proceed.  History of Present Illness   Amanda Vasquez is a 53 year old female who presents with worsening ADHD symptoms and stress.  She has experienced a significant worsening of her ADHD symptoms, which have become increasingly disruptive to her daily life. She is unable to complete tasks and experiences heightened irritability, leading to confrontations with others. She has never taken medication for ADHD, relying instead on personal strategies, but now feels overwhelmed and in need of help. She attempted to consult a specialist in [place] but was unable to secure an appointment.  She is currently menopausal and is unsure if hormonal changes are contributing to her symptoms. She has been evaluated by a psychotherapist and psychiatrist through an online provider, [facility], which is covered by her insurance. She is currently taking Wellbutrin  and sertraline .  She has a history of a high cardiac calcium  score and abdominal aorta calcium , for which she is under the care of a cardiologist. She is taking Lipitor prophylactically. She has undergone previous cardiac evaluations and is scheduled for a repeat echocardiogram, DEXA scan, and chemical stress test due to her family history, diabetes, and calcium  score.  She experienced a significant health event after taking Ozempic  in 2023, which led to diabetic ketoacidosis and required ICU admission for insulin  drip treatment. She is also attending [facility], where she has been successful in weight management since February 14, 2024, with a follow-up appointment scheduled for March 30, 2024.  She suffered a concussion in January, which resulted in a period of bed rest, further impacting her ability  to manage her home and daily activities.      Social History   Tobacco Use  Smoking Status Former   Current packs/day: 0.00   Average packs/day: 0.3 packs/day for 24.2 years (6.1 ttl pk-yrs)   Types: Cigarettes   Start date: 06/08/1986   Quit date: 08/22/2010   Years since quitting: 13.6  Smokeless Tobacco Never    Current Outpatient Medications on File Prior to Visit  Medication Sig Dispense Refill   ALPRAZolam  (XANAX ) 0.5 MG tablet TAKE ONE TABLET BY MOUTH ONCE DAILY AS NEEDED FOR ANXIETY. 30 tablet 1   atorvastatin  (LIPITOR) 40 MG tablet TAKE 1 TABLET BY MOUTH DAILY AT 6 IN THEEVENING. 90 tablet 3   botulinum toxin Type A  (BOTOX ) 200 units injection Provider to inject 155 units into the muscles of the head and neck every 3 months. Discard remainder. 1 each 0   buPROPion  (WELLBUTRIN  XL) 300 MG 24 hr tablet Take 1 tablet (300 mg total) by mouth daily. 90 tablet 3   calcium  carbonate (OSCAL) 1500 (600 Ca) MG TABS tablet Take 1,500 mg by mouth daily with breakfast.     Cetirizine  HCl 10 MG CAPS Take 1 capsule (10 mg total) by mouth daily as needed. Take by mouth. 30 capsule    Cholecalciferol  (VITAMIN D ) 50 MCG (2000 UT) CAPS Take 2 capsules (4,000 Units total) by mouth daily.     Continuous Glucose Sensor (DEXCOM G7 SENSOR) MISC USE TO MONITOR GLUCOSE CONTINUOUSLY CHANGE EVERY 10 DAYS. 6 each 3   CYANOCOBALAMIN PO Take by mouth daily. 1 gummy     cyclobenzaprine  (FLEXERIL ) 10 MG tablet Take 1 tablet (10 mg total) by mouth  at bedtime. 90 tablet 3   enoxaparin  (LOVENOX ) 80 MG/0.8ML injection Inject 0.8 mLs (80 mg total) into the skin every 12 (twelve) hours. Pt has surgery and after she has to be on coumadin  and lovenox  until INR is 2 and then stop the lovenox . INR1.2 needs more injection 16 mL 0   FARXIGA  10 MG TABS tablet TAKE ONE TABLET BY MOUTH EVERY DAY BEFORE BREAKFAST 90 tablet 3   Galcanezumab -gnlm (EMGALITY ) 120 MG/ML SOAJ Inject 120 mg into the skin every 30 (thirty) days.      glipiZIDE  (GLUCOTROL ) 5 MG tablet Take 1 tablet (5 mg total) by mouth daily before supper. 90 tablet 3   GRALISE  600 MG TABS Take 1 tablet by mouth daily.     insulin  glargine (LANTUS  SOLOSTAR) 100 UNIT/ML Solostar Pen Inject 33 Units into the skin at bedtime. 30 mL 3   Insulin  Pen Needle 32G X 4 MM MISC Use 1x a day 100 each 3   metFORMIN  (GLUCOPHAGE ) 1000 MG tablet TAKE ONE TABLET BY MOUTH TWICE DAILY WITH MEALS 180 tablet 3   morphine  (MS CONTIN ) 60 MG 12 hr tablet Take 60 mg by mouth every 12 (twelve) hours.     Multiple Vitamin (MULTIVITAMIN) tablet Take 1 tablet by mouth daily.     nitroGLYCERIN  (NITROSTAT ) 0.4 MG SL tablet Place 1 tablet (0.4 mg total) under the tongue every 5 (five) minutes as needed for chest pain. 25 tablet 3   NON FORMULARY CBD Gummies     oxyCODONE  (OXY IR/ROXICODONE ) 5 MG immediate release tablet Take 1 tablet (5 mg total) by mouth every 4 (four) hours as needed for severe pain. Per pain clinic Dr Orlando 30 tablet    Polyethylene Glycol 3350  (MIRALAX  PO) Take 17 g by mouth daily as needed.     propranolol  ER (INDERAL  LA) 80 MG 24 hr capsule TAKE 1 CAPSULE BY MOUTH ONCE DAILY 90 capsule 3   Rimegepant Sulfate (NURTEC) 75 MG TBDP Take 75 mg by mouth daily as needed. For migraines. Take as close to onset of migraine as possible. One daily maximum. 16 tablet 6   sertraline  (ZOLOFT ) 100 MG tablet Take 1.5 tablets (150 mg total) by mouth daily. TAKE 150 mg qd 135 tablet 3   valACYclovir  (VALTREX ) 1000 MG tablet TAKE 1 TABLET BY MOUTH TWICE DAILY WITH FOOD FOR OUTBREAK FOR 3-7 DAYS AS DIRECTED. 60 tablet 2   warfarin (COUMADIN ) 10 MG tablet TAKE 1 TABLET BY MOUTH DAILY FOR 6 DAYS A WEEK 30 tablet 1   No current facility-administered medications on file prior to visit.     ROS see history of present illness  Objective  Physical Exam Vitals:   03/13/24 1129  BP: 122/70  Pulse: 71  Temp: 98.5 F (36.9 C)  SpO2: 97%    BP Readings from Last 3 Encounters:   03/13/24 122/70  03/05/24 130/78  02/28/24 130/76   Wt Readings from Last 3 Encounters:  03/13/24 227 lb 3.2 oz (103.1 kg)  03/05/24 233 lb 3.2 oz (105.8 kg)  02/28/24 233 lb (105.7 kg)    Physical Exam Constitutional:      General: She is not in acute distress.    Appearance: Normal appearance.  HENT:     Head: Normocephalic.  Cardiovascular:     Rate and Rhythm: Normal rate and regular rhythm.     Heart sounds: Normal heart sounds.  Pulmonary:     Effort: Pulmonary effort is normal.     Breath sounds:  Normal breath sounds.  Skin:    General: Skin is warm and dry.  Neurological:     General: No focal deficit present.     Mental Status: She is alert.  Psychiatric:        Mood and Affect: Mood normal.        Behavior: Behavior normal.      Assessment/Plan: Please see individual problem list.  Attention deficit hyperactivity disorder (ADHD), unspecified ADHD type Assessment & Plan: ADHD symptoms have worsened. Initiated non-stimulant therapy to avoid stimulant side effects. Prescribe Strattera  (atomoxetine ) and monitor for side effects such as nausea, dry mouth, insomnia, and decreased appetite. Schedule follow-up in one month to assess effectiveness.    Obesity (BMI 30-39.9) Assessment & Plan: Encourage healthy diet and regular exercise. Follow [facility] with [facility] scheduled.    Type 2 diabetes mellitus without complication, with long-term current use of insulin  ([name]) Assessment & Plan: Hx of adverse reaction to Ozempic . Currently taking metformin , glipizide  and farxiga . Not taking Lantus  at this time. A1c well controlled. Encourage follow up with Endocrinology.    Hyperlipidemia associated with type 2 diabetes mellitus ([name]) Assessment & Plan: Continue Lipitor 40 mg daily.    Coronary artery disease involving native coronary artery of native heart without angina pectoris Assessment & Plan: Continue Lipitor 40 mg daily. Encourage healthy  diet, regular exercise, and lifestyle modifications. Follow up with Cardiology as scheduled.        Return in about 4 weeks (around 04/10/2024) for Follow up.   Leron Glance, NP-C [facility]

## 2024-03-15 ENCOUNTER — Other Ambulatory Visit: Payer: Self-pay | Admitting: Nurse Practitioner

## 2024-03-15 ENCOUNTER — Telehealth: Payer: Self-pay

## 2024-03-15 DIAGNOSIS — F909 Attention-deficit hyperactivity disorder, unspecified type: Secondary | ICD-10-CM

## 2024-03-15 MED ORDER — ATOMOXETINE HCL 40 MG PO CAPS: 40.0000 mg | ORAL_CAPSULE | Freq: Every day | ORAL | 0 refills | Status: AC

## 2024-03-15 NOTE — Telephone Encounter (Signed)
Pt informed

## 2024-03-15 NOTE — Telephone Encounter (Signed)
 Copied from CRM #[id number]. Topic: Clinical - Medication Question >> Mar 15, 2024 11:46 AM Chasity T wrote: Reason for CRM: Patient has called in to ask for another prescription for atomoxetine  (STRATTERA ) 40 MG capsule to be sent to the pharmacy because she lost the one that was just filled on yesterday after picking it up.

## 2024-03-19 DIAGNOSIS — Z79891 Long term (current) use of opiate analgesic: Secondary | ICD-10-CM | POA: Diagnosis not present

## 2024-03-19 DIAGNOSIS — G890 Central pain syndrome: Secondary | ICD-10-CM | POA: Diagnosis not present

## 2024-03-19 DIAGNOSIS — M62830 Muscle spasm of back: Secondary | ICD-10-CM | POA: Diagnosis not present

## 2024-03-19 DIAGNOSIS — G894 Chronic pain syndrome: Secondary | ICD-10-CM | POA: Diagnosis not present

## 2024-03-19 LAB — PROTIME-INR: INR: 3.4 — AB (ref 0.80–1.20)

## 2024-03-20 ENCOUNTER — Encounter: Payer: Self-pay | Admitting: Oncology

## 2024-03-20 ENCOUNTER — Other Ambulatory Visit: Payer: Self-pay | Admitting: Nurse Practitioner

## 2024-03-20 DIAGNOSIS — F909 Attention-deficit hyperactivity disorder, unspecified type: Secondary | ICD-10-CM

## 2024-03-20 DIAGNOSIS — F32A Depression, unspecified: Secondary | ICD-10-CM

## 2024-03-20 DIAGNOSIS — F5104 Psychophysiologic insomnia: Secondary | ICD-10-CM

## 2024-03-25 ENCOUNTER — Other Ambulatory Visit

## 2024-03-25 ENCOUNTER — Encounter: Payer: Self-pay | Admitting: Nurse Practitioner

## 2024-03-25 NOTE — Assessment & Plan Note (Signed)
 Hx of adverse reaction to Ozempic . Currently taking metformin , glipizide  and farxiga . Not taking Lantus  at this time. A1c well controlled. Encourage follow up with Endocrinology.

## 2024-03-25 NOTE — Assessment & Plan Note (Signed)
 Encourage healthy diet and regular exercise. Follow up with [facility] as scheduled.

## 2024-03-25 NOTE — Assessment & Plan Note (Signed)
 Continue Lipitor 40 mg daily. Encourage healthy diet, regular exercise, and lifestyle modifications. Follow up with Cardiology as scheduled.

## 2024-03-25 NOTE — Assessment & Plan Note (Addendum)
 ADHD symptoms have worsened. Initiated non-stimulant therapy to avoid stimulant side effects. Prescribe Strattera  (atomoxetine ) and monitor for side effects such as nausea, dry mouth, insomnia, and decreased appetite. Schedule follow-up in one month to assess effectiveness.

## 2024-03-25 NOTE — Assessment & Plan Note (Signed)
 -  Continue Lipitor 40 mg daily ?

## 2024-03-26 ENCOUNTER — Ambulatory Visit

## 2024-03-26 LAB — PROTIME-INR: INR: 2.8 — AB (ref 0.80–1.20)

## 2024-03-27 ENCOUNTER — Encounter (INDEPENDENT_AMBULATORY_CARE_PROVIDER_SITE_OTHER): Payer: Self-pay | Admitting: Family Medicine

## 2024-03-27 ENCOUNTER — Ambulatory Visit (INDEPENDENT_AMBULATORY_CARE_PROVIDER_SITE_OTHER): Admitting: Family Medicine

## 2024-03-27 VITALS — BP 91/60 | HR 75 | Temp 98.2°F | Ht 64.0 in | Wt 220.0 lb

## 2024-03-27 DIAGNOSIS — E119 Type 2 diabetes mellitus without complications: Secondary | ICD-10-CM

## 2024-03-27 DIAGNOSIS — Z6837 Body mass index (BMI) 37.0-37.9, adult: Secondary | ICD-10-CM

## 2024-03-27 DIAGNOSIS — Z7984 Long term (current) use of oral hypoglycemic drugs: Secondary | ICD-10-CM

## 2024-03-27 DIAGNOSIS — Z794 Long term (current) use of insulin: Secondary | ICD-10-CM

## 2024-03-27 NOTE — Assessment & Plan Note (Addendum)
 In last 14 days she has been in range 93% o the time with average blood sugar of 134 and estimated A1c of 6.5, 30 day average of 133 and GMI of 6.5 with 92% in range.  Last 7 days average glucose of 141 with GMI of 6.7 and 89% in range.  Last 3 day average glucose of 154.  She attributes last 7 days to higher blood sugars due to stress and decreased intake and then fell asleep without taking her lantus .  She has also been eating more figs and her trees are producing quite a bit. She will set alarms to remind her to take her medication.  Follow up on CGM at next appointment.  Continue current medications at same dosage.

## 2024-03-27 NOTE — Progress Notes (Signed)
 SUBJECTIVE:  Chief Complaint: Obesity  Interim History: Month of July has been a bit stressful which she thinks may have attributed to her not eating as much as she did previously.  She was a bit more active than she was previously.  The heat was quite intense recently so she has only been able to do what she can outside.  Most of the time she is able to get all the food in.  She focuses on the protein first but is struggling to get everything in. No upcoming plans for this month.  Amanda Vasquez is here to discuss her progress with her obesity treatment plan. She is on the [employer] and states she is following her eating plan approximately 90 % of the time. She states she is exercising 60 minutes 2-3 times per week.   OBJECTIVE: Visit Diagnoses: Problem List Items Addressed This Visit       Endocrine   Diabetes mellitus, type II ([name]) - Primary   In last 14 days she has been in range 93% o the time with average blood sugar of 134 and estimated A1c of 6.5, 30 day average of 133 and GMI of 6.5 with 92% in range.  Last 7 days average glucose of 141 with GMI of 6.7 and 89% in range.  Last 3 day average glucose of 154.  She attributes last 7 days to higher blood sugars due to stress and decreased intake and then fell asleep without taking her lantus .  She has also been eating more figs and her trees are producing quite a bit. She will set alarms to remind her to take her medication.  Follow up on CGM at next appointment.  Continue current medications at same dosage.       Vitals Temp: 98.2 F (36.8 C) BP: 91/60 Pulse Rate: 75 SpO2: 99 %   Anthropometric Measurements Height: 5' 4 (1.626 m) Weight: 220 lb (99.8 kg) BMI (Calculated): 37.74 Weight at Last Visit: 233 lb Weight Lost Since Last Visit: 13 lb Weight Gained Since Last Visit: 0 Starting Weight: 239 lb Total Weight Loss (lbs): 19 lb (8.618 kg)   Body Composition  Body Fat %: 50.5 % Fat Mass (lbs): 111.2 lbs Muscle Mass  (lbs): 103.4 lbs Total Body Water (lbs): 86.2 lbs Visceral Fat Rating : 14   Other Clinical Data Today's Visit #: 3 Starting Date: 02/14/24 Comments: [place]     ASSESSMENT AND PLAN: Assessment & Plan Type 2 diabetes mellitus without complication, with long-term current use of insulin  ([name]) In last 14 days she has been in range 93% o the time with average blood sugar of 134 and estimated A1c of 6.5, 30 day average of 133 and GMI of 6.5 with 92% in range.  Last 7 days average glucose of 141 with GMI of 6.7 and 89% in range.  Last 3 day average glucose of 154.  She attributes last 7 days to higher blood sugars due to stress and decreased intake and then fell asleep without taking her lantus .  She has also been eating more figs and her trees are producing quite a bit. She will set alarms to remind her to take her medication.  Follow up on CGM at next appointment.  Continue current medications at same dosage. Morbid obesity ([name])  BMI 37.0-37.9, adult    Diet: Amanda Vasquez is currently in the action stage of change. As such, her goal is to continue with weight loss efforts and has agreed to the [employer].  Exercise:  All adults should avoid inactivity. Some activity is better than none, and adults who participate in any amount of physical activity, gain some health benefits.  Behavior Modification:  We discussed the following Behavioral Modification Strategies today: increasing lean protein intake, decreasing simple carbohydrates, increasing vegetables, meal planning and cooking strategies, and planning for success.   Return in about 3 weeks (around 04/17/2024).   She was informed of the importance of frequent follow up visits to maximize her success with intensive lifestyle modifications for her multiple health conditions.  Attestation Statements:   Reviewed by clinician on day of visit: allergies, medications, problem list, medical history, surgical history, family history, social  history, and previous encounter notes.    Adelita Cho, MD

## 2024-03-29 DIAGNOSIS — E113293 Type 2 diabetes mellitus with mild nonproliferative diabetic retinopathy without macular edema, bilateral: Secondary | ICD-10-CM | POA: Diagnosis not present

## 2024-04-01 ENCOUNTER — Encounter: Payer: Self-pay | Admitting: Oncology

## 2024-04-02 DIAGNOSIS — D6861 Antiphospholipid syndrome: Secondary | ICD-10-CM | POA: Diagnosis not present

## 2024-04-02 DIAGNOSIS — Z7901 Long term (current) use of anticoagulants: Secondary | ICD-10-CM | POA: Diagnosis not present

## 2024-04-05 ENCOUNTER — Ambulatory Visit: Admitting: Nurse Practitioner

## 2024-04-08 ENCOUNTER — Ambulatory Visit

## 2024-04-09 ENCOUNTER — Ambulatory Visit: Payer: Self-pay | Admitting: Physician Assistant

## 2024-04-09 ENCOUNTER — Other Ambulatory Visit: Payer: Self-pay | Admitting: Oncology

## 2024-04-09 ENCOUNTER — Ambulatory Visit
Admission: RE | Admit: 2024-04-09 | Discharge: 2024-04-09 | Disposition: A | Discharge status: Home / Self Care | Source: Ambulatory Visit | Attending: Physician Assistant | Admitting: Physician Assistant

## 2024-04-09 DIAGNOSIS — R0609 Other forms of dyspnea: Secondary | ICD-10-CM | POA: Insufficient documentation

## 2024-04-09 LAB — NM MYOCAR MULTI W/SPECT W/WALL MOTION / EF
LV dias vol: 82 mL (ref 46–106)
LV sys vol: 25 mL (ref 3.8–5.2)
Nuc Stress EF: 70 %
Peak HR: 107 {beats}/min
Percent HR: 64 %
Rest HR: 63 {beats}/min
Rest Nuclear Isotope Dose: 10.7 mCi
SDS: 0
SRS: 0
SSS: 0
Stress Nuclear Isotope Dose: 31.3 mCi
TID: 0.8

## 2024-04-09 LAB — PROTIME-INR: INR: 2.5 — AB (ref 0.80–1.20)

## 2024-04-09 MED ORDER — TECHNETIUM TC 99M TETROFOSMIN IV KIT
31.2900 | PACK | Freq: Once | INTRAVENOUS | Status: AC | PRN
  Administered 2024-04-09: 31.29 via INTRAVENOUS

## 2024-04-09 MED ORDER — TECHNETIUM TC 99M TETROFOSMIN IV KIT
10.7100 | PACK | Freq: Once | INTRAVENOUS | Status: AC | PRN
  Administered 2024-04-09: 10.71 via INTRAVENOUS

## 2024-04-09 MED ORDER — REGADENOSON 0.4 MG/5ML IV SOLN
0.4000 mg | Freq: Once | INTRAVENOUS | Status: AC
  Administered 2024-04-09: 0.4 mg via INTRAVENOUS

## 2024-04-10 ENCOUNTER — Encounter: Payer: Self-pay | Admitting: Oncology

## 2024-04-12 ENCOUNTER — Ambulatory Visit: Admitting: Nurse Practitioner

## 2024-04-15 ENCOUNTER — Ambulatory Visit

## 2024-04-16 DIAGNOSIS — Z79891 Long term (current) use of opiate analgesic: Secondary | ICD-10-CM | POA: Diagnosis not present

## 2024-04-16 DIAGNOSIS — G890 Central pain syndrome: Secondary | ICD-10-CM | POA: Diagnosis not present

## 2024-04-16 DIAGNOSIS — M62830 Muscle spasm of back: Secondary | ICD-10-CM | POA: Diagnosis not present

## 2024-04-16 DIAGNOSIS — G894 Chronic pain syndrome: Secondary | ICD-10-CM | POA: Diagnosis not present

## 2024-04-16 LAB — PROTIME-INR: INR: 2.4 — AB (ref 0.80–1.20)

## 2024-04-17 ENCOUNTER — Encounter (INDEPENDENT_AMBULATORY_CARE_PROVIDER_SITE_OTHER): Payer: Self-pay | Admitting: Family Medicine

## 2024-04-17 ENCOUNTER — Telehealth (INDEPENDENT_AMBULATORY_CARE_PROVIDER_SITE_OTHER): Admitting: Family Medicine

## 2024-04-17 DIAGNOSIS — E119 Type 2 diabetes mellitus without complications: Secondary | ICD-10-CM

## 2024-04-17 DIAGNOSIS — Z6837 Body mass index (BMI) 37.0-37.9, adult: Secondary | ICD-10-CM

## 2024-04-17 DIAGNOSIS — Z794 Long term (current) use of insulin: Secondary | ICD-10-CM

## 2024-04-17 NOTE — Progress Notes (Signed)
 TeleHealth Visit:   Amanda Vasquez has verbally consented to this TeleHealth visit. The patient is located at home, the provider is located at the [facility] office. The participants in this visit include the listed provider and patient. The visit was conducted today via MyChart.   Chief Complaint: OBESITY Amanda Vasquez is here to discuss her progress with her obesity treatment plan along with follow-up of her obesity related diagnoses. Amanda Vasquez is on the [employer] and states she is following her eating plan approximately 75% of the time. Amanda Vasquez states she is not exercising much due to illness.  No data recorded No data recorded No data recorded No data recorded  Reported Weight: 212 lb  Subjective:  Patient has been having a lower GI situation where she really hasn't felt well for the last week. She has been having vomiting and having diarrhea and for 3 days she wasn't able to get anything in her.  She is not straying far from the toilet. She has not tolerated her meds a few days but otherwise has been able to get her medications in. There are no diagnoses linked to this encounter. Assessment/Plan:   Assessment & Plan Type 2 diabetes mellitus without complication, with long-term current use of insulin  ([name]) Patient has been consistent her Farxiga , glipizide , Lantus , metformin  usage.  No significant side effects of any of these medications mentioned.  She does report blood sugars better controlled than expected given recent illness.  Follow-up on blood sugar control at next appointment Morbid obesity ([name])  BMI 37.0-37.9, adult    There are no diagnoses linked to this encounter.  Amanda Vasquez is currently in the action stage of change. As such, her goal is to continue with weight loss efforts and has agreed to the [employer].   Exercise goals: For substantial health benefits, adults should do at least 150 minutes (2 hours and 30 minutes) a week of moderate-intensity, or 75 minutes  (1 hour and 15 minutes) a week of vigorous-intensity aerobic physical activity, or an equivalent combination of moderate- and vigorous-intensity aerobic activity. Aerobic activity should be performed in episodes of at least 10 minutes, and preferably, it should be spread throughout the week.  Behavioral modification strategies: increasing lean protein intake, decreasing simple carbohydrates, increasing vegetables, meal planning and cooking strategies, and planning for success.  Amanda Vasquez has agreed to follow-up with our clinic in 4 weeks. She was informed of the importance of frequent follow-up visits to maximize her success with intensive lifestyle modifications for her multiple health conditions.   Objective:   VITALS: Per patient if applicable, see vitals. GENERAL: Alert and in no acute distress. CARDIOPULMONARY: No increased WOB. Speaking in clear sentences.  PSYCH: Pleasant and cooperative. Speech normal rate and rhythm. Affect is appropriate. Insight and judgement are appropriate. Attention is focused, linear, and appropriate.  NEURO: Oriented as arrived to appointment on time with no prompting.   Lab Results  Component Value Date   CREATININE 0.85 02/14/2024   BUN 13 02/14/2024   NA 139 02/14/2024   K 4.1 02/14/2024   CL 101 02/14/2024   CO2 20 02/14/2024   Lab Results  Component Value Date   ALT 20 02/14/2024   AST 25 02/14/2024   ALKPHOS 75 02/14/2024   BILITOT 0.3 02/14/2024   Lab Results  Component Value Date   HGBA1C 8.6 (A) 02/09/2024   HGBA1C 7.6 (A) 08/01/2023   HGBA1C 7.6 (A) 04/27/2023   HGBA1C 9.3 (A) 12/29/2022   HGBA1C 6.8 (A)  08/09/2022   Lab Results  Component Value Date   INSULIN  24.3 06/01/2020   Lab Results  Component Value Date   TSH 3.180 02/14/2024   Lab Results  Component Value Date   CHOL 135 02/14/2024   HDL 61 02/14/2024   LDLCALC 49 02/14/2024   LDLDIRECT 80.0 09/19/2018   TRIG 145 02/14/2024   CHOLHDL 2.2 02/14/2024   Lab Results   Component Value Date   VD25OH 38.5 02/14/2024   VD25OH 24.29 (L) 09/13/2022   VD25OH 78.2 10/21/2020   Lab Results  Component Value Date   WBC 6.4 02/14/2024   HGB 12.1 02/14/2024   HCT 39.0 02/14/2024   MCV 86 02/14/2024   PLT 292 02/14/2024   Lab Results  Component Value Date   IRON 65 05/06/2010    Attestation Statements:   Reviewed by clinician on day of visit: allergies, medications, problem list, medical history, surgical history, family history, social history, and previous encounter notes.     Adelita Cho, MD

## 2024-04-18 ENCOUNTER — Encounter: Payer: Self-pay | Admitting: Oncology

## 2024-04-18 DIAGNOSIS — Z79891 Long term (current) use of opiate analgesic: Secondary | ICD-10-CM | POA: Diagnosis not present

## 2024-04-23 ENCOUNTER — Ambulatory Visit: Admitting: Nurse Practitioner

## 2024-04-23 LAB — PROTIME-INR: INR: 2.5 — AB (ref 0.80–1.20)

## 2024-04-24 DIAGNOSIS — L821 Other seborrheic keratosis: Secondary | ICD-10-CM | POA: Diagnosis not present

## 2024-04-24 DIAGNOSIS — D2372 Other benign neoplasm of skin of left lower limb, including hip: Secondary | ICD-10-CM | POA: Diagnosis not present

## 2024-04-24 NOTE — Assessment & Plan Note (Signed)
 Patient has been consistent her Farxiga , glipizide , Lantus , metformin  usage.  No significant side effects of any of these medications mentioned.  She does report blood sugars better controlled than expected given recent illness.  Follow-up on blood sugar control at next appointment

## 2024-04-25 ENCOUNTER — Encounter: Payer: Self-pay | Admitting: Oncology

## 2024-04-30 DIAGNOSIS — D6861 Antiphospholipid syndrome: Secondary | ICD-10-CM | POA: Diagnosis not present

## 2024-04-30 DIAGNOSIS — Z7901 Long term (current) use of anticoagulants: Secondary | ICD-10-CM | POA: Diagnosis not present

## 2024-04-30 LAB — PROTIME-INR: INR: 2.8 — AB (ref 0.80–1.20)

## 2024-05-02 ENCOUNTER — Encounter: Payer: Self-pay | Admitting: Oncology

## 2024-05-02 ENCOUNTER — Ambulatory Visit: Admitting: Nurse Practitioner

## 2024-05-03 ENCOUNTER — Other Ambulatory Visit: Payer: Self-pay | Admitting: Nurse Practitioner

## 2024-05-03 DIAGNOSIS — Z23 Encounter for immunization: Secondary | ICD-10-CM

## 2024-05-03 MED ORDER — COVID-19 MRNA VACC (MODERNA) 50 MCG/0.5ML IM SUSY: 0.5000 mL | PREFILLED_SYRINGE | Freq: Once | INTRAMUSCULAR | 0 refills | Status: AC

## 2024-05-03 NOTE — Telephone Encounter (Signed)
Vax pended

## 2024-05-03 NOTE — Telephone Encounter (Signed)
 Copied from CRM #[id number]. Topic: Clinical - Medication Refill >> May 03, 2024 11:39 AM Roselie BROCKS wrote: Reason of crm : covid shot  Has the patient contacted their pharmacy? Yes (Agent: If no, request that the patient contact the pharmacy for the refill. If patient does not wish to contact the pharmacy document the reason why and proceed with request.) (Agent: If yes, when and what did the pharmacy advise?)  This is the patient's preferred pharmacy:   [id number] - [place], [place] - [address] [[id number]]  Is this the correct pharmacy for this prescription? Yes If no, delete pharmacy and type the correct one.   Has the prescription been filled recently? Yes  Is the patient out of the medication? Yes  Has the patient been seen for an appointment in the last year OR does the patient have an upcoming appointment? Yes  Can we respond through MyChart? Yes  Agent: Please be advised that Rx refills may take up to 3 business days. We ask that you follow-up with your pharmacy.

## 2024-05-06 ENCOUNTER — Ambulatory Visit: Admitting: Physician Assistant

## 2024-05-07 ENCOUNTER — Encounter: Payer: Self-pay | Admitting: Gastroenterology

## 2024-05-07 ENCOUNTER — Encounter: Payer: Self-pay | Admitting: Nurse Practitioner

## 2024-05-07 LAB — PROTIME-INR: INR: 2.1 — AB (ref 0.80–1.20)

## 2024-05-09 ENCOUNTER — Encounter: Payer: Self-pay | Admitting: Oncology

## 2024-05-09 ENCOUNTER — Other Ambulatory Visit: Payer: Self-pay | Admitting: Nurse Practitioner

## 2024-05-09 DIAGNOSIS — F32A Depression, unspecified: Secondary | ICD-10-CM

## 2024-05-09 MED ORDER — BUPROPION HCL ER (XL) 300 MG PO TB24: 300.0000 mg | ORAL_TABLET | Freq: Every day | ORAL | 3 refills | Status: AC

## 2024-05-09 MED ORDER — SERTRALINE HCL 100 MG PO TABS: 150.0000 mg | ORAL_TABLET | Freq: Every day | ORAL | 3 refills | Status: AC

## 2024-05-14 ENCOUNTER — Other Ambulatory Visit: Payer: Self-pay | Admitting: Oncology

## 2024-05-14 LAB — PROTIME-INR: INR: 3 — AB (ref 0.80–1.20)

## 2024-05-15 ENCOUNTER — Ambulatory Visit (INDEPENDENT_AMBULATORY_CARE_PROVIDER_SITE_OTHER): Admitting: Family Medicine

## 2024-05-15 DIAGNOSIS — G890 Central pain syndrome: Secondary | ICD-10-CM | POA: Diagnosis not present

## 2024-05-15 DIAGNOSIS — Z79891 Long term (current) use of opiate analgesic: Secondary | ICD-10-CM | POA: Diagnosis not present

## 2024-05-15 DIAGNOSIS — G894 Chronic pain syndrome: Secondary | ICD-10-CM | POA: Diagnosis not present

## 2024-05-15 DIAGNOSIS — M62830 Muscle spasm of back: Secondary | ICD-10-CM | POA: Diagnosis not present

## 2024-05-16 ENCOUNTER — Encounter: Payer: Self-pay | Admitting: Oncology

## 2024-05-21 LAB — PROTIME-INR: INR: 2.7 — AB (ref 0.80–1.20)

## 2024-05-22 ENCOUNTER — Ambulatory Visit: Attending: Physician Assistant

## 2024-05-22 DIAGNOSIS — R0609 Other forms of dyspnea: Secondary | ICD-10-CM | POA: Diagnosis not present

## 2024-05-23 ENCOUNTER — Encounter: Payer: Self-pay | Admitting: Oncology

## 2024-05-23 LAB — ECHOCARDIOGRAM COMPLETE
AR max vel: 2.78 cm2
AV Area VTI: 2.84 cm2
AV Area mean vel: 2.73 cm2
AV Mean grad: 4 mmHg
AV Peak grad: 6.9 mmHg
Ao pk vel: 1.31 m/s
Area-P 1/2: 3.23 cm2
S' Lateral: 2.68 cm

## 2024-05-24 ENCOUNTER — Other Ambulatory Visit: Payer: Self-pay | Admitting: Nurse Practitioner

## 2024-05-24 DIAGNOSIS — F32A Depression, unspecified: Secondary | ICD-10-CM
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
## 2024-05-28 ENCOUNTER — Ambulatory Visit: Attending: Physician Assistant | Admitting: Physician Assistant

## 2024-05-28 ENCOUNTER — Encounter: Payer: Self-pay | Admitting: Physician Assistant

## 2024-05-28 VITALS — BP 126/68 | HR 62 | Ht 65.0 in | Wt 226.8 lb

## 2024-05-28 DIAGNOSIS — Z7901 Long term (current) use of anticoagulants: Secondary | ICD-10-CM | POA: Diagnosis not present

## 2024-05-28 DIAGNOSIS — I25118 Atherosclerotic heart disease of native coronary artery with other forms of angina pectoris: Secondary | ICD-10-CM | POA: Diagnosis not present

## 2024-05-28 DIAGNOSIS — E785 Hyperlipidemia, unspecified: Secondary | ICD-10-CM | POA: Diagnosis not present

## 2024-05-28 DIAGNOSIS — D6861 Antiphospholipid syndrome: Secondary | ICD-10-CM | POA: Diagnosis not present

## 2024-05-28 DIAGNOSIS — Z86711 Personal history of pulmonary embolism: Secondary | ICD-10-CM | POA: Diagnosis not present

## 2024-05-28 LAB — PROTIME-INR: INR: 1.8 — AB (ref 0.80–1.20)

## 2024-05-28 NOTE — Patient Instructions (Signed)
 Medication Instructions:  Your physician recommends that you continue on your current medications as directed. Please refer to the Current Medication list given to you today.   *If you need a refill on your cardiac medications before your next appointment, please call your pharmacy*  Lab Work: None ordered at this time   Follow-Up: At [facility], you and your health needs are our priority.  As part of our continuing mission to provide you with exceptional heart care, our providers are all part of one team.  This team includes your primary Cardiologist (physician) and Advanced Practice Providers or APPs (Physician Assistants and Nurse Practitioners) who all work together to provide you with the care you need, when you need it.  Your next appointment:   1 year(s)  Provider:   You may see Deatrice Cage, MD or one of the following Advanced Practice Providers on your designated Care Team:   Lesley Maffucci, PA-C Bernardino Bring, PA-C

## 2024-05-30 ENCOUNTER — Encounter: Payer: Self-pay | Admitting: Oncology

## 2024-05-31 ENCOUNTER — Ambulatory Visit: Payer: Self-pay | Admitting: Oncology

## 2024-05-31 DIAGNOSIS — Z7901 Long term (current) use of anticoagulants: Secondary | ICD-10-CM

## 2024-05-31 NOTE — Telephone Encounter (Addendum)
 Per Dr. Melanee Please have her do another INR check next week . Detailed voice message left.

## 2024-05-31 NOTE — Telephone Encounter (Signed)
-----   Message from Amanda Vasquez sent at 05/31/2024  8:36 AM EDT ----- Please have her do another INR check next week ----- Message ----- From: Melba Grayce MATSU Sent: 05/30/2024  10:37 AM EDT To: Amanda JAYSON Skene, MD

## 2024-06-01 ENCOUNTER — Encounter: Payer: Self-pay | Admitting: Oncology

## 2024-06-03 ENCOUNTER — Inpatient Hospital Stay

## 2024-06-04 LAB — PROTIME-INR: INR: 2.5 — AB (ref 0.80–1.20)

## 2024-06-06 ENCOUNTER — Encounter: Payer: Self-pay | Admitting: Oncology

## 2024-06-10 ENCOUNTER — Ambulatory Visit: Admitting: Internal Medicine

## 2024-06-10 NOTE — Telephone Encounter (Signed)
 INR done 06/04/24 was 2.5.

## 2024-06-11 LAB — PROTIME-INR: INR: 2.4 — AB (ref 0.80–1.20)

## 2024-06-12 DIAGNOSIS — G890 Central pain syndrome: Secondary | ICD-10-CM | POA: Diagnosis not present

## 2024-06-12 DIAGNOSIS — Z79891 Long term (current) use of opiate analgesic: Secondary | ICD-10-CM | POA: Diagnosis not present

## 2024-06-12 DIAGNOSIS — M62830 Muscle spasm of back: Secondary | ICD-10-CM | POA: Diagnosis not present

## 2024-06-12 DIAGNOSIS — G894 Chronic pain syndrome: Secondary | ICD-10-CM | POA: Diagnosis not present

## 2024-06-13 ENCOUNTER — Encounter: Payer: Self-pay | Admitting: Oncology

## 2024-06-17 LAB — PROTIME-INR: INR: 2.2 — AB (ref 0.80–1.20)

## 2024-06-18 ENCOUNTER — Encounter: Payer: Self-pay | Admitting: Oncology

## 2024-06-18 NOTE — Progress Notes (Signed)
 Amanda Vasquez                                          MRN: [id number]   06/18/2024   The [facility] Quality Team Specialist reviewed this patient medical record for the purposes of chart review for care gap closure. The following were reviewed: chart review for care gap closure-kidney health evaluation for diabetes:eGFR  and uACR.    [facility] Quality Team

## 2024-06-21 ENCOUNTER — Other Ambulatory Visit: Payer: Self-pay | Admitting: Nurse Practitioner

## 2024-06-21 DIAGNOSIS — F419 Anxiety disorder, unspecified: Secondary | ICD-10-CM

## 2024-06-21 MED ORDER — ALPRAZOLAM 0.5 MG PO TABS: ORAL_TABLET | ORAL | 2 refills | Status: DC

## 2024-06-24 ENCOUNTER — Other Ambulatory Visit: Payer: Self-pay | Admitting: Medical Genetics

## 2024-06-24 DIAGNOSIS — Z006 Encounter for examination for normal comparison and control in clinical research program: Secondary | ICD-10-CM

## 2024-06-25 LAB — PROTIME-INR: INR: 2.4 — AB (ref 0.80–1.20)

## 2024-06-26 ENCOUNTER — Encounter: Payer: Self-pay | Admitting: Oncology

## 2024-06-26 ENCOUNTER — Ambulatory Visit: Admitting: Nurse Practitioner

## 2024-07-02 LAB — PROTIME-INR: INR: 2.6 — AB (ref 0.80–1.20)

## 2024-07-04 ENCOUNTER — Encounter: Payer: Self-pay | Admitting: Oncology

## 2024-07-09 LAB — PROTIME-INR: INR: 3.1 — AB (ref 0.80–1.20)

## 2024-07-11 ENCOUNTER — Encounter: Admitting: Nurse Practitioner

## 2024-07-15 ENCOUNTER — Encounter: Payer: Self-pay | Admitting: Oncology

## 2024-07-15 LAB — PROTIME-INR: INR: 2.5 — AB (ref 0.80–1.20)

## 2024-07-16 ENCOUNTER — Ambulatory Visit: Admitting: Internal Medicine

## 2024-07-26 LAB — PROTIME-INR: INR: 2.5 — AB (ref 0.80–1.20)

## 2024-07-29 ENCOUNTER — Encounter: Payer: Self-pay | Admitting: Oncology

## 2024-07-30 LAB — PROTIME-INR: INR: 3.3 — AB (ref 0.80–1.20)

## 2024-07-31 ENCOUNTER — Encounter: Payer: Self-pay | Admitting: Oncology

## 2024-08-06 ENCOUNTER — Encounter: Admitting: Nurse Practitioner

## 2024-08-07 LAB — PROTIME-INR: INR: 2.2 — AB (ref 0.80–1.20)

## 2024-08-08 ENCOUNTER — Encounter: Payer: Self-pay | Admitting: Oncology

## 2024-08-13 LAB — PROTIME-INR: INR: 1.9 — AB (ref 0.80–1.20)

## 2024-08-19 ENCOUNTER — Ambulatory Visit: Payer: Self-pay | Admitting: Oncology

## 2024-08-19 ENCOUNTER — Encounter: Payer: Self-pay | Admitting: Oncology

## 2024-08-20 LAB — PROTIME-INR: INR: 2.6 — AB (ref 0.80–1.20)

## 2024-08-23 ENCOUNTER — Encounter: Payer: Self-pay | Admitting: Oncology

## 2024-08-23 NOTE — Telephone Encounter (Signed)
 INR 3 days ago 2.60

## 2024-08-27 ENCOUNTER — Other Ambulatory Visit: Payer: Self-pay | Admitting: Oncology

## 2024-08-27 LAB — PROTIME-INR: INR: 2.6 — AB (ref 0.80–1.20)

## 2024-08-30 ENCOUNTER — Encounter: Payer: Self-pay | Admitting: Oncology

## 2024-09-01 LAB — PROTIME-INR: INR: 2.4 — AB (ref 0.80–1.20)

## 2024-09-06 ENCOUNTER — Encounter: Payer: Self-pay | Admitting: Oncology

## 2024-09-10 ENCOUNTER — Other Ambulatory Visit: Payer: Self-pay | Admitting: Nurse Practitioner

## 2024-09-10 DIAGNOSIS — F419 Anxiety disorder, unspecified: Secondary | ICD-10-CM

## 2024-09-10 LAB — PROTIME-INR: INR: 2.5 — AB (ref 0.80–1.20)

## 2024-09-13 ENCOUNTER — Encounter: Payer: Self-pay | Admitting: Oncology

## 2024-09-17 LAB — PROTIME-INR: INR: 2.4 — AB (ref 0.80–1.20)

## 2024-09-18 ENCOUNTER — Encounter: Payer: Self-pay | Admitting: Oncology

## 2024-09-24 LAB — PROTIME-INR: INR: 2.2 — AB (ref 0.80–1.20)

## 2024-09-25 ENCOUNTER — Other Ambulatory Visit: Payer: Self-pay | Admitting: Oncology

## 2024-09-26 ENCOUNTER — Encounter: Payer: Self-pay | Admitting: Oncology

## 2024-10-03 ENCOUNTER — Encounter: Admitting: Nurse Practitioner

## 2024-10-07 ENCOUNTER — Ambulatory Visit: Payer: Medicare Other | Admitting: Oncology

## 2024-10-07 ENCOUNTER — Other Ambulatory Visit: Payer: Medicare Other
